# Patient Record
Sex: Male | Born: 1970 | Race: White | Hispanic: No | Marital: Married | Smoking: Current every day smoker
Health system: Southern US, Community
[De-identification: ages and names within clinical notes are randomized; demographics above are authoritative.]

## PROBLEM LIST (undated history)

## (undated) DIAGNOSIS — D45 Polycythemia vera: Principal | ICD-10-CM

## (undated) DIAGNOSIS — G510 Bell's palsy: Secondary | ICD-10-CM

## (undated) DIAGNOSIS — D751 Secondary polycythemia: Secondary | ICD-10-CM

## (undated) DIAGNOSIS — J449 Chronic obstructive pulmonary disease, unspecified: Secondary | ICD-10-CM

## (undated) DIAGNOSIS — E785 Hyperlipidemia, unspecified: Secondary | ICD-10-CM

## (undated) DIAGNOSIS — T7840XA Allergy, unspecified, initial encounter: Secondary | ICD-10-CM

## (undated) DIAGNOSIS — M1990 Unspecified osteoarthritis, unspecified site: Secondary | ICD-10-CM

## (undated) DIAGNOSIS — F419 Anxiety disorder, unspecified: Secondary | ICD-10-CM

## (undated) HISTORY — DX: Allergy, unspecified, initial encounter: T78.40XA

## (undated) HISTORY — DX: Hyperlipidemia, unspecified: E78.5

## (undated) HISTORY — DX: Unspecified osteoarthritis, unspecified site: M19.90

## (undated) HISTORY — DX: Polycythemia vera: D45

## (undated) HISTORY — DX: Bell's palsy: G51.0

## (undated) HISTORY — DX: Anxiety disorder, unspecified: F41.9

## (undated) HISTORY — PX: HAND SURGERY: SHX662

## (undated) HISTORY — PX: DENTAL SURGERY: SHX609

## (undated) HISTORY — DX: Secondary polycythemia: D75.1

---

## 2003-03-04 ENCOUNTER — Emergency Department (HOSPITAL_COMMUNITY): Admission: EM | Admit: 2003-03-04 | Discharge: 2003-03-04 | Payer: Self-pay | Admitting: Emergency Medicine

## 2011-01-23 ENCOUNTER — Ambulatory Visit (INDEPENDENT_AMBULATORY_CARE_PROVIDER_SITE_OTHER): Payer: Self-pay

## 2011-01-23 DIAGNOSIS — Z719 Counseling, unspecified: Secondary | ICD-10-CM

## 2011-05-03 ENCOUNTER — Telehealth: Payer: Self-pay

## 2011-05-03 NOTE — Telephone Encounter (Signed)
Pt was given #90 with 5 refills by Dr. Cleta Alberts in October.  He is about due for a recheck but his prescription should last him the rest of this month. (April 25th).

## 2011-05-03 NOTE — Telephone Encounter (Signed)
Pt would like to know if he could have a refill on xanax.         Pt is out today and he states that he has had the pharmacy fax over a request but there is not a request in pt's chart.

## 2011-05-03 NOTE — Telephone Encounter (Signed)
Chart at PA desk for review

## 2011-05-04 ENCOUNTER — Ambulatory Visit (INDEPENDENT_AMBULATORY_CARE_PROVIDER_SITE_OTHER): Payer: Self-pay | Admitting: Emergency Medicine

## 2011-05-04 VITALS — BP 128/82 | HR 59 | Temp 98.2°F | Resp 16 | Ht 75.0 in | Wt 194.0 lb

## 2011-05-04 DIAGNOSIS — F411 Generalized anxiety disorder: Secondary | ICD-10-CM

## 2011-05-04 DIAGNOSIS — A630 Anogenital (venereal) warts: Secondary | ICD-10-CM

## 2011-05-04 DIAGNOSIS — F419 Anxiety disorder, unspecified: Secondary | ICD-10-CM

## 2011-05-04 MED ORDER — ALPRAZOLAM 1 MG PO TABS: 1.0000 mg | ORAL_TABLET | Freq: Three times a day (TID) | ORAL | Status: DC

## 2011-05-04 MED ORDER — PODOFILOX 0.5 % EX GEL: CUTANEOUS | Status: DC

## 2011-05-04 NOTE — Telephone Encounter (Signed)
PT STATES HIS WIFE WAS REALLY SICK AT ONE TIME AND HE DID TAKE MORE THAN USUAL, BUT HE IS OUT AND IS GOING OUT OF TOWN THIS WEEKEND AND REALLY NEED THIS REFILLED WILL BE GLAD TO SPEAK WITH THE DR ABOUT THIS IF NEEDED. PLEASE CALL [phone]    [facility] [address]

## 2011-05-04 NOTE — Telephone Encounter (Signed)
Spoke w/pt about needing RF bf due. Pt stated that when his wife went through some traumatic seizures and hosp stay recently (she saw Dr Patsy Lager a wk ago and pt was w/her) he had to take more xanax than usual dose to cope, but pt does not have any h/o doing this. D/W pt that he is due for f/up w/Dr Cleta Alberts anyway and suggested he come on in. Pt agreed and will come in this morn

## 2011-05-04 NOTE — Patient Instructions (Signed)
Genital Warts Genital warts are a sexually transmitted infection. They may appear as small bumps on the tissues of the genital area. CAUSES  Genital warts are caused by a virus called human papillomavirus (HPV). HPV is the most common sexually transmitted disease (STD) and infection of the sex organs. This infection is spread by having unprotected sex with an infected person. It can be spread by vaginal, anal, and oral sex. Many people do not know they are infected. They may be infected for years without problems. However, even if they do not have problems, they can unknowingly pass the infection to their sexual partners. SYMPTOMS   Itching and irritation in the genital area.   Warts that bleed.   Painful sexual intercourse.  DIAGNOSIS  Warts are usually recognized with the naked eye on the vagina, vulva, perineum, anus, and rectum. Certain tests can also diagnose genital warts, such as:  A Pap test.   A tissue sample (biopsy) exam.   Colposcopy. A magnifying tool is used to examine the vagina and cervix. The HPV cells will change color when certain solutions are used.  TREATMENT  Warts can be removed by:  Applying certain chemicals, such as cantharidin or podophyllin.   Liquid nitrogen freezing (cryotherapy).   Immunotherapy with candida or trichophyton injections.   Laser treatment.   Burning with an electrified probe (electrocautery).   Interferon injections.   Surgery.  PREVENTION  HPV vaccination can help prevent HPV infections that cause genital warts and that cause cancer of the cervix. It is recommended that the vaccination be given to people between the ages 9 to 26 years old. The vaccine might not work as well or might not work at all if you already have HPV. It should not be given to pregnant women. HOME CARE INSTRUCTIONS   It is important to follow your caregiver's instructions. The warts will not go away without treatment. Repeat treatments are often needed to get  rid of warts. Even after it appears that the warts are gone, the normal tissue underneath often remains infected.   Do not try to treat genital warts with medicine used to treat hand warts. This type of medicine is strong and can burn the skin in the genital area, causing more damage.   Tell your past and current sexual partner(s) that you have genital warts. They may be infected also and need treatment.   Avoid sexual contact while being treated.   Do not touch or scratch the warts. The infection may spread to other parts of your body.   Women with genital warts should have a cervical cancer check (Pap test) at least once a year. This type of cancer is slow-growing and can be cured if found early. Chances of developing cervical cancer are increased with HPV.   Inform your obstetrician about your warts in the event of pregnancy. This virus can be passed to the baby's respiratory tract. Discuss this with your caregiver.   Use a condom during sexual intercourse. Following treatment, the use of condoms will help prevent reinfection.   Ask your caregiver about using over-the-counter anti-itch creams.  SEEK MEDICAL CARE IF:   Your treated skin becomes red, swollen, or painful.   You have a fever.   You feel generally ill.   You feel little lumps in and around your genital area.   You are bleeding or have painful sexual intercourse.  MAKE SURE YOU:   Understand these instructions.   Will watch your condition.   Will   get help right away if you are not doing well or get worse.  Document Released: 01/10/2000 Document Revised: 01/01/2011 Document Reviewed: 07/21/2010 [employer] Patient Information 2012 [employer]. 

## 2011-05-04 NOTE — Progress Notes (Signed)
  Subjective:    Patient ID: Stuart Hill, male    DOB: [birth year], 40 y.o.   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 There is one other isolated area on the shaft that is approximately 1/2-1/2 cm the        Assessment & Plan:   Assessment his venereal warts. We'll treat with Condylox. Xanax was refilled for 2 months. His wife is undergoing treatment for breast cancer.

## 2011-07-01 ENCOUNTER — Telehealth: Payer: Self-pay

## 2011-07-01 ENCOUNTER — Ambulatory Visit: Payer: Self-pay | Admitting: Emergency Medicine

## 2011-07-01 ENCOUNTER — Other Ambulatory Visit: Payer: Self-pay | Admitting: *Deleted

## 2011-07-01 ENCOUNTER — Ambulatory Visit (HOSPITAL_COMMUNITY)
Admission: RE | Admit: 2011-07-01 | Discharge: 2011-07-01 | Disposition: A | Discharge status: Home / Self Care | Payer: Self-pay | Source: Ambulatory Visit | Attending: Emergency Medicine | Admitting: Emergency Medicine

## 2011-07-01 ENCOUNTER — Ambulatory Visit: Payer: Self-pay

## 2011-07-01 VITALS — BP 128/74 | HR 55 | Temp 97.5°F | Resp 16 | Ht 75.0 in | Wt 191.0 lb

## 2011-07-01 DIAGNOSIS — K6289 Other specified diseases of anus and rectum: Secondary | ICD-10-CM | POA: Insufficient documentation

## 2011-07-01 DIAGNOSIS — R109 Unspecified abdominal pain: Secondary | ICD-10-CM | POA: Insufficient documentation

## 2011-07-01 DIAGNOSIS — L509 Urticaria, unspecified: Secondary | ICD-10-CM

## 2011-07-01 DIAGNOSIS — R1084 Generalized abdominal pain: Secondary | ICD-10-CM

## 2011-07-01 DIAGNOSIS — R142 Eructation: Secondary | ICD-10-CM | POA: Insufficient documentation

## 2011-07-01 DIAGNOSIS — F419 Anxiety disorder, unspecified: Secondary | ICD-10-CM

## 2011-07-01 DIAGNOSIS — R141 Gas pain: Secondary | ICD-10-CM | POA: Insufficient documentation

## 2011-07-01 DIAGNOSIS — F411 Generalized anxiety disorder: Secondary | ICD-10-CM

## 2011-07-01 LAB — POCT UA - MICROSCOPIC ONLY
Bacteria, U Microscopic: NEGATIVE
Casts, Ur, LPF, POC: NEGATIVE
Mucus, UA: NEGATIVE
Yeast, UA: NEGATIVE

## 2011-07-01 LAB — POCT CBC
HCT, POC: 50.6 % (ref 43.5–53.7)
Hemoglobin: 16.5 g/dL (ref 14.1–18.1)
Lymph, poc: 2.6 (ref 0.6–3.4)
MCH, POC: 30.4 pg (ref 27–31.2)
MCHC: 32.6 g/dL (ref 31.8–35.4)
WBC: 10.8 10*3/uL — AB (ref 4.6–10.2)

## 2011-07-01 LAB — POCT URINALYSIS DIPSTICK
Bilirubin, UA: NEGATIVE
Blood, UA: NEGATIVE
Nitrite, UA: NEGATIVE
Spec Grav, UA: 1.015
pH, UA: 6.5

## 2011-07-01 LAB — IFOBT (OCCULT BLOOD): IFOBT: NEGATIVE

## 2011-07-01 MED ORDER — ALPRAZOLAM 1 MG PO TABS: 1.0000 mg | ORAL_TABLET | Freq: Three times a day (TID) | ORAL | Status: DC

## 2011-07-01 NOTE — Telephone Encounter (Signed)
Per Dr Cleta Alberts; called Mr Prosser and advised that xray and ultra sound were all normal, take Zantac prescribed.

## 2011-07-01 NOTE — Patient Instructions (Signed)
Please proceed with our hospital for your ultrasound. That will be a call report.

## 2011-07-01 NOTE — Telephone Encounter (Signed)
Note found on patient, and he was called to confirm that he did receive results earlier.  Will follow-up if no improvement in symptoms.

## 2011-07-01 NOTE — Progress Notes (Signed)
Subjective:    Patient ID: Stuart Hill, male    DOB: 1970-11-10, 41 y.o.   MRN: [id number]  HPI patient states that he's been having severe lower abdominal pain with a bloating sensation. Having pain in the suprapubic area as well as pain in his rectal area. He has felt bloated in his upper abdomen but has not actually vomited. While he has been waiting to be examined he has developed hives on his face and trunk which is not unusual for him. He took some Gas-X last night.. he did not eat anything unusual today. He has had a problem with recurrent hives in the past the    Review of Systems he has a severe anxiety disorder and is on Xanax for this problem     Objective:   Physical Exam  Constitutional: He appears well-developed and well-nourished.  HENT:  Head: Normocephalic.       History of exam is normal. There is no swelling of the uvula soft palate or tonsillar area the tongue itself is also normal  Eyes: Pupils are equal, round, and reactive to light.  Neck: No tracheal deviation present. No thyromegaly present.  Cardiovascular: Normal rate and regular rhythm.   Pulmonary/Chest: No respiratory distress. He has no wheezes.  Abdominal: He exhibits distension. There is tenderness. There is no rebound and no guarding.  Skin:       There is an outbreak of hives involving the face neck and upper trunk   Prostate is slightly tender however no masses are felt   .  Results for orders placed in visit on 07/01/11  POCT CBC      Component Value Range   WBC 10.8 (*) 4.6 - 10.2 (K/uL)   Lymph, poc 2.6  0.6 - 3.4    POC LYMPH PERCENT 24.2  10 - 50 (%L)   MID (cbc) 0.7  0 - 0.9    POC MID % 6.7  0 - 12 (%M)   POC Granulocyte 7.5 (*) 2 - 6.9    Granulocyte percent 69.1  37 - 80 (%G)   RBC 5.43  4.69 - 6.13 (M/uL)   Hemoglobin 16.5  14.1 - 18.1 (g/dL)   HCT, POC 65.7  84.6 - 53.7 (%)   MCV 93.2  80 - 97 (fL)   MCH, POC 30.4  27 - 31.2 (pg)   MCHC 32.6  31.8 - 35.4 (g/dL)   RDW, POC  96.2     Platelet Count, POC 299  142 - 424 (K/uL)   MPV 10.9  0 - 99.8 (fL)  POCT UA - MICROSCOPIC ONLY      Component Value Range   WBC, Ur, HPF, POC negative     RBC, urine, microscopic negative     Bacteria, U Microscopic negative     Mucus, UA negative     Epithelial cells, urine per micros 0-2     Crystals, Ur, HPF, POC negative     Casts, Ur, LPF, POC negative     Yeast, UA negativef    POCT URINALYSIS DIPSTICK      Component Value Range   Color, UA yellow     Clarity, UA clear     Glucose, UA negative     Bilirubin, UA negative     Ketones, UA negative     Spec Grav, UA 1.015     Blood, UA negative     pH, UA 6.5     Protein, UA negative  Urobilinogen, UA 0.2     Nitrite, UA negative     Leukocytes, UA Negative    IFOBT (OCCULT BLOOD)      Component Value Range   IFOBT Negative    [facility] reading (PRIMARY) by  Dr.Brayla Pat acute abdominal series is normal.       Assessment & Plan:  Urine and stool occult blood was negative. His white cell count is very slightly elevated. We'll try and proceed with an ultrasound of the entire abdomen to see if we can pick up any abnormalities. Patient is allergic to all antihistamines and multiple medications. I decided that he could give him a trial of ranitidine 150 twice a day. I did give him a prescription for an EpiPen which he has had in the past however he has never filled. Because of his history of severe allergies and allergic reactions I was afraid to do a CT with contrast and we'll proceed with an abdominal ultrasound.

## 2011-08-31 ENCOUNTER — Ambulatory Visit: Payer: Self-pay | Admitting: Physician Assistant

## 2011-08-31 ENCOUNTER — Telehealth: Payer: Self-pay

## 2011-08-31 VITALS — BP 138/72 | HR 71 | Temp 98.0°F | Resp 16 | Ht 74.75 in | Wt 189.8 lb

## 2011-08-31 DIAGNOSIS — L03119 Cellulitis of unspecified part of limb: Secondary | ICD-10-CM

## 2011-08-31 DIAGNOSIS — IMO0002 Reserved for concepts with insufficient information to code with codable children: Secondary | ICD-10-CM

## 2011-08-31 MED ORDER — SULFAMETHOXAZOLE-TRIMETHOPRIM 800-160 MG PO TABS: 1.0000 | ORAL_TABLET | Freq: Two times a day (BID) | ORAL | Status: AC

## 2011-08-31 MED ORDER — DOXYCYCLINE HYCLATE 100 MG PO CAPS: 100.0000 mg | ORAL_CAPSULE | Freq: Two times a day (BID) | ORAL | Status: DC

## 2011-08-31 NOTE — Telephone Encounter (Signed)
Pt is went to pharmacy and was told to call office and let Dr know that it is over $100 he didn't remember Dr. name

## 2011-08-31 NOTE — Telephone Encounter (Signed)
Doxycycline is expensive, took Rx to [facility] it is $100 he is at pharmacy awaiting instructions from [place] about this.

## 2011-08-31 NOTE — Telephone Encounter (Signed)
Please advise patient I've sent in Septra to replace the Doxycycline.

## 2011-08-31 NOTE — Progress Notes (Signed)
  Subjective:    Patient ID: Stuart Hill, male    DOB: 1970/08/30, 41 y.o.   MRN: [id number]  HPI Patient presents with 3 day history of a red bump on his right forearm. Says it started as an ingrown hair but has grown and become hard and more painful. No spontaneous purulent drainage. Does admit to some warmth to touch.  No fevers, chills, nausea, or vomiting.     Review of Systems  All other systems reviewed and are negative.       Objective:   Physical Exam  Constitutional: He is oriented to person, place, and time. He appears well-developed and well-nourished.  HENT:  Head: Normocephalic and atraumatic.  Right Ear: External ear normal.  Left Ear: External ear normal.  Eyes: Conjunctivae are normal.  Neck: Normal range of motion.  Cardiovascular: Regular rhythm.   Pulmonary/Chest: Effort normal.  Neurological: He is alert and oriented to person, place, and time.  Skin:     Psychiatric: He has a normal mood and affect. His behavior is normal. Judgment and thought content normal.          Assessment & Plan:   1. Cellulitis of arm  Wound culture, doxycycline (VIBRAMYCIN) 100 MG capsule  Culture sent. Start Doxycycline 100 mg bid x 10 days.   Follow up if symptoms persist.

## 2011-09-01 NOTE — Telephone Encounter (Signed)
Called patient to advise  °

## 2011-09-03 LAB — WOUND CULTURE
Gram Stain: NONE SEEN
Gram Stain: NONE SEEN
Gram Stain: NONE SEEN

## 2011-10-27 ENCOUNTER — Encounter: Payer: Self-pay | Admitting: Physician Assistant

## 2011-10-27 ENCOUNTER — Ambulatory Visit: Payer: Self-pay | Admitting: Physician Assistant

## 2011-10-27 VITALS — BP 129/81 | HR 88 | Temp 98.0°F | Resp 16 | Ht 75.0 in | Wt 190.0 lb

## 2011-10-27 DIAGNOSIS — J069 Acute upper respiratory infection, unspecified: Secondary | ICD-10-CM

## 2011-10-27 DIAGNOSIS — H6690 Otitis media, unspecified, unspecified ear: Secondary | ICD-10-CM

## 2011-10-27 LAB — POCT INFLUENZA A/B: Influenza B, POC: NEGATIVE

## 2011-10-27 MED ORDER — IPRATROPIUM BROMIDE 0.03 % NA SOLN: 2.0000 | Freq: Two times a day (BID) | NASAL | Status: DC

## 2011-10-27 MED ORDER — AMOXICILLIN 875 MG PO TABS: 1750.0000 mg | ORAL_TABLET | Freq: Two times a day (BID) | ORAL | Status: DC

## 2011-10-27 NOTE — Progress Notes (Signed)
Subjective:    Patient ID: Stuart Hill, male    DOB: 1970/05/12, 41 y.o.   MRN: [id number]  HPI  Stuart Hill is a 41 yr old male complaining of a 1 day history of nasal congestion, sore throat, ear pain and fullness, headache, and malaise.  He woke up yesterday morning with lots of nasal congestion and drainage that caused him to vomit.  He felt fatigued all day and developed swollen glands, and ear pain.  He has had no further vomiting.  He tried Afrin last night and this morning but then developed hives under his eyes.  He states he has allergies to many medications with the exception of Xanax and antibiotics.  He has not had any fever.  He has had headache as well as some loose stools.  He has some cough but his predominant symptoms are nasal congestion and rhinorrhea.  He requests a flu test.  He has not had a flu shot in 25 years because it "induces flu in the body".     Review of Systems  Constitutional: Positive for chills, diaphoresis, appetite change and fatigue. Negative for fever.  HENT: Positive for ear pain, congestion, rhinorrhea and sneezing.   Eyes: Positive for discharge.  Respiratory: Positive for cough. Negative for shortness of breath.   Cardiovascular: Negative.   Gastrointestinal: Negative.   Musculoskeletal: Positive for myalgias. Negative for arthralgias.  Neurological: Positive for dizziness and headaches.  Hematological: Positive for adenopathy.       Objective:   Physical Exam  Constitutional: He is oriented to person, place, and time. He appears well-developed and well-nourished. He does not appear ill.  HENT:  Right Ear: External ear and ear canal normal. Tympanic membrane is erythematous and retracted.  Left Ear: Tympanic membrane, external ear and ear canal normal.  Nose: Mucosal edema and rhinorrhea present. Right sinus exhibits no maxillary sinus tenderness and no frontal sinus tenderness. Left sinus exhibits no maxillary sinus tenderness and no  frontal sinus tenderness.  Mouth/Throat: Uvula is midline, oropharynx is clear and moist and mucous membranes are normal.  Cardiovascular: Normal rate, regular rhythm and normal heart sounds.   Pulmonary/Chest: Breath sounds normal. He has no wheezes. He has no rales.  Lymphadenopathy:    He has no cervical adenopathy.  Neurological: He is alert and oriented to person, place, and time.  Skin: Skin is warm and dry.    Results for orders placed in visit on 10/27/11  POCT INFLUENZA A/B      Component Value Range   Influenza A, POC Negative     Influenza B, POC Negative           Assessment & Plan:   1. URI (upper respiratory infection)  POCT Influenza A/B, ipratropium (ATROVENT) 0.03 % nasal spray  2. OM (otitis media)  amoxicillin (AMOXIL) 875 MG tablet   Stuart Hill is a 41 yr old male with a 1 day history of URI symptoms.  He requested a flu test which was negative.  My feeling is that this is likely a viral URI; however, the patient was fairly persistent in asking for medication.  His right TM is erythematous and retracted; it's possible that this is an early otitis media infection.  I offered a short course of prednisone to help reduce inflammation and pain, but the patient said that he does not tolerate steroids.  So I went ahead and sent a prescription for amoxicillin for presumptive otitis media.  Additionally I sent Atrovent nasal spray  for relief of nasal congestion and rhinorrhea.  The patient was given URI home care instructions.  He will return if worsening or not improving.

## 2011-10-27 NOTE — Patient Instructions (Signed)

## 2011-10-28 ENCOUNTER — Encounter: Payer: Self-pay | Admitting: Physician Assistant

## 2011-10-28 NOTE — Progress Notes (Signed)
Discussed, reviewed and agree.  

## 2011-10-29 ENCOUNTER — Telehealth: Payer: Self-pay

## 2011-10-29 NOTE — Telephone Encounter (Signed)
The patient's wife [name] called to report that [name] is still not feeling better from his 10/27/11 appointment, and is coughing up brown, green and yellow phlegm.  The patient's wife is questioning whether the patient needs different antibiotic.  Please call at [phone].

## 2011-10-29 NOTE — Telephone Encounter (Signed)
He is on Amox, I have called to advise may need to give more time. Asked if he is taking Mucinex, he can not take, he gets a rash. Wife advised he is not running a fever, he just has a very productive cough. She will give the ABX more time, if he is not better tomorrow to bring him back in. She asked what else we may do. Advised he may need chest xray if he does not improve. Advised to return sooner if he develops a fever or if his symptoms change.   FYI

## 2011-10-30 ENCOUNTER — Ambulatory Visit: Payer: Self-pay | Admitting: Family Medicine

## 2011-10-30 VITALS — BP 118/82 | HR 86 | Temp 98.1°F | Resp 16 | Ht 72.0 in | Wt 189.0 lb

## 2011-10-30 DIAGNOSIS — J029 Acute pharyngitis, unspecified: Secondary | ICD-10-CM

## 2011-10-30 DIAGNOSIS — R5383 Other fatigue: Secondary | ICD-10-CM

## 2011-10-30 DIAGNOSIS — R5381 Other malaise: Secondary | ICD-10-CM

## 2011-10-30 LAB — POCT RAPID STREP A (OFFICE): Rapid Strep A Screen: NEGATIVE

## 2011-10-30 LAB — POCT CBC
Granulocyte percent: 64.3 %G (ref 37–80)
MCV: 96.2 fL (ref 80–97)
MID (cbc): 0.7 (ref 0–0.9)
MPV: 10 fL (ref 0–99.8)
POC Granulocyte: 5.6 (ref 2–6.9)
POC LYMPH PERCENT: 27.7 %L (ref 10–50)
POC MID %: 8 %M (ref 0–12)
Platelet Count, POC: 306 10*3/uL (ref 142–424)
RBC: 5.52 M/uL (ref 4.69–6.13)
RDW, POC: 13.7 %

## 2011-10-30 LAB — TSH: TSH: 1.283 u[IU]/mL (ref 0.350–4.500)

## 2011-10-30 NOTE — Progress Notes (Signed)
[facility] [address], [place] [address] [phone]  Date:  10/30/2011   Name:  Stuart Hill   DOB:  March 10, 1970   MRN:  [id number]  PCP:  Lucilla Edin, MD    Chief Complaint: Generalized Body Aches, Sinusitis, Cough, Sore Throat and Chest Pain   History of Present Illness:  Stuart Hill is a 41 y.o. very pleasant male patient who presents with the following:  Here today for follow- up of illness.  Seen on 10/27/11: HPI [name] is a 41 yr old male complaining of a 1 day history of nasal congestion, sore throat, ear pain and fullness, headache, and malaise. He woke up yesterday morning with lots of nasal congestion and drainage that caused him to vomit. He felt fatigued all day and developed swollen glands, and ear pain. He has had no further vomiting. He tried Afrin last night and this morning but then developed hives under his eyes. He states he has allergies to many medications with the exception of Xanax and antibiotics. He has not had any fever. He has had headache as well as some loose stools. He has some cough but his predominant symptoms are nasal congestion and rhinorrhea. He requests a flu test. He has not had a flu shot in 25 years because it "induces flu in the body".  He was felt to have a viral URI, but he was started on amoxicillin per his request and for possible early OM.  875 BID  He has not gotten much better yet.  He still notes a lot of pressure in his sinuses.  He is coughing up discolored mucus.  He has a headache and feels fatigued.  He was allergic to the atrovent nasal spray.  He wonders if he needs to have his thyroid checked as he tends to have hives and intolerances to lots of different things such as pressure from a tight shirt and many medications. He through this meant that things are not metabolized by his body normally   He has had a temperature up to 101.7 a few days ago, but this broke quickly.  He does feel feverish  still but his temp has been ok.  No antipyretics today.    There is no problem list on file for this patient.   Past Medical History  Diagnosis Date  . Allergy     No past surgical history on file.  History  Substance Use Topics  . Smoking status: Current Every Day Smoker -- 1.0 packs/day for 24 years    Types: Cigarettes  . Smokeless tobacco: Not on file  . Alcohol Use: Not on file    Family History  Problem Relation Age of Onset  . COPD Mother   . Fibromyalgia Mother   . Heart disease Father   . Depression Brother   . Heart disease Maternal Grandmother   . Stroke Maternal Grandmother   . Heart disease Maternal Grandfather   . Stroke Maternal Grandfather   . Heart disease Paternal Grandmother   . Stroke Paternal Grandmother   . Heart disease Paternal Grandfather   . Stroke Paternal Grandfather     Allergies  Allergen Reactions  . Advil (Ibuprofen) Hives  . Afrin (Nasal Spray) Hives  . Antihistamines, Chlorpheniramine-Type   . Atrovent (Ipratropium) Hives  . Bc Powder (Aspirin-Salicylamide-Caffeine)   . Benadryl (Diphenhydramine Hcl) Hives  . Codeine Hives  . Gastromax (Metoclopramide)   . Morphine And Related Hives  .  [name] (Carisoprodol) Hives  . Tylenol (Acetaminophen) Hives    Medication list has been reviewed and updated.  Current Outpatient Prescriptions on File Prior to Visit  Medication Sig Dispense Refill  . ALPRAZolam (XANAX) 1 MG tablet Take 1 tablet (1 mg total) by mouth 3 (three) times daily.  90 tablet  5  . amoxicillin (AMOXIL) 875 MG tablet Take 2 tablets (1,750 mg total) by mouth 2 (two) times daily.  20 tablet  0  . ipratropium (ATROVENT) 0.03 % nasal spray Place 2 sprays into the nose 2 (two) times daily.  30 mL  5  . podofilox (CONDYLOX) 0.5 % gel Apply twice a day for 3 days followed by 4 days of rest. Then reapply as instructed.  3.5 g  3    Review of Systems:  As per HPI- otherwise negative.   Physical Examination: Filed Vitals:    10/30/11 1242  BP: 118/82  Pulse: 86  Temp: 98.1 F (36.7 C)  Resp: 16   Filed Vitals:   10/30/11 1242  Height: 6' (1.829 m)  Weight: 189 lb (85.73 kg)   Body mass index is 25.63 kg/(m^2). Ideal Body Weight: Weight in (lb) to have BMI = 25: 183.9   GEN: WDWN, NAD, Non-toxic, A & O x 3- does not appear acutely ill today HEENT: Atraumatic, Normocephalic. Neck supple. No masses, No LAD. Bilateral TM wnl- no current evidence of OM, oropharynx normal to slightly injected.  PEERL,EOMI.   Ears and Nose: No external deformity. CV: RRR, No M/G/R. No JVD. No thrill. No extra heart sounds. PULM: CTA B, no wheezes, crackles, rhonchi. No retractions. No resp. distress. No accessory muscle use. ABD: S, NT, ND, +BS. No rebound. No HSM. EXTR: No c/c/e NEURO Normal gait.  PSYCH: Normally interactive. Conversant. Not depressed or anxious appearing.  Calm demeanor.   Results for orders placed in visit on 10/30/11  POCT CBC      Component Value Range   WBC 8.7  4.6 - 10.2 K/uL   Lymph, poc 2.4  0.6 - 3.4   POC LYMPH PERCENT 27.7  10 - 50 %L   MID (cbc) 0.7  0 - 0.9   POC MID % 8.0  0 - 12 %M   POC Granulocyte 5.6  2 - 6.9   Granulocyte percent 64.3  37 - 80 %G   RBC 5.52  4.69 - 6.13 M/uL   Hemoglobin 17.4  14.1 - 18.1 g/dL   HCT, POC 16.1  09.6 - 53.7 %   MCV 96.2  80 - 97 fL   MCH, POC 31.5 (*) 27 - 31.2 pg   MCHC 32.8  31.8 - 35.4 g/dL   RDW, POC 04.5     Platelet Count, POC 306  142 - 424 K/uL   MPV 10.0  0 - 99.8 fL  POCT RAPID STREP A (OFFICE)      Component Value Range   Rapid Strep A Screen Negative  Negative    Assessment and Plan: 1. Fatigue  POCT CBC, TSH  2. Sore throat  POCT rapid strep A   Did check a TSH today per [name]'s request.  I do think that he likely has a viral illness.  He may even have the flu, despite his negative flu test.  However, his normal CBC is reassuring that he does not have an occult bacterial infection.  Encouraged him to rest, drink plenty of  fluids, and finish out the amoxicillin.  If he is not better  in the next couple of days please let me know.    Abbe Amsterdam, MD

## 2011-11-04 ENCOUNTER — Telehealth: Payer: Self-pay

## 2011-11-04 DIAGNOSIS — J40 Bronchitis, not specified as acute or chronic: Secondary | ICD-10-CM

## 2011-11-04 MED ORDER — AZITHROMYCIN 250 MG PO TABS: ORAL_TABLET | ORAL | Status: DC

## 2011-11-04 NOTE — Telephone Encounter (Signed)
Pt states that he is experiencing chest congestion, cough, and is still fatigued. Pt would like to discuss whether it is neccessary to have a chest x-ray to determine whether or not he has developed pneumonia. Best# [phone]

## 2011-11-04 NOTE — Telephone Encounter (Signed)
Dr Patsy Lager, in your OV notes you had asked pt to let you know if he does not start to feel better. Do you want to have me call pt back w/instr's?

## 2011-11-04 NOTE — Telephone Encounter (Signed)
Called to check on him- he still notes that he is coughing up a lot of phlegm.  The phlegm he is coughing up is discolored.  He still feels congested in his ear and his ear is still sensitive.  His symptoms have more moved to his chest, and the ear hurts.   He finished the amoxicillin about 3 days ago.  He has not had any fever recently.  He is concerned that he is still coughing.  We will try a zpak - I called this in for him.  He will let me know if not helpful

## 2011-11-23 ENCOUNTER — Ambulatory Visit: Payer: Self-pay | Admitting: Internal Medicine

## 2011-11-23 VITALS — BP 130/83 | HR 72 | Temp 98.2°F | Resp 16 | Ht 75.5 in | Wt 192.4 lb

## 2011-11-23 DIAGNOSIS — S0180XA Unspecified open wound of other part of head, initial encounter: Secondary | ICD-10-CM

## 2011-11-23 NOTE — Progress Notes (Signed)
   [address] [place] [address]   Phone [phone]  Subjective:    Patient ID: Stuart Hill, male    DOB: [birth year], 41 y.o.   MRN: [id number]  HPI Pt presents to clinic after a metal tool hit him in the face while at work.  He has some pain on his R TMJ area with opening his mouth.  He has a small cut on his R external cheek and inside buccal mucosa.  He does not know when his last tetanus injection was but he is worried about getting it because of his long list of allergies to medications.  He really does not want any stitches.  No teeth pain, he is missing a few teeth but that was before his incident.  He had no LOC upon impact and only his head was knocked back upon impact. He has a slight HA.   Review of Systems     Objective:   Physical Exam  Vitals reviewed. Constitutional: He appears well-developed and well-nourished.  Eyes: Conjunctivae normal are normal.  Pulmonary/Chest: Effort normal.  Skin: Skin is warm and dry.       Small ~3-55mm laceration on L cheek  - buccal mucosa laceration about 2cm long but not an open flap.  Psychiatric: He has a normal mood and affect. His behavior is normal. Judgment and thought content normal.   No teeth TTP, no Jaw TTP, he only has pain in his TMJ with opening/closing his jaw. No maxillary TTP.      Assessment & Plan:   1. Wound, open, face    Will not update pt's tetanus due to his concern with his allergies and the low likelihood of developing tetanus.  Pt decline laceration repair on face/cheek.  Due to no TTP of jaw do not think an xray will change our treatment plan.  Pt to gargle to remove food particles from buccal laceration and monitor for changing symptoms.  RTC if anything changes.  Pt agrees with the treatment plan.  D/w Dr Merla Riches. I have reviewed and agree with documentation. Robert P. Merla Riches, M.D.

## 2012-01-27 ENCOUNTER — Telehealth: Payer: Self-pay | Admitting: *Deleted

## 2012-01-27 DIAGNOSIS — F419 Anxiety disorder, unspecified: Secondary | ICD-10-CM

## 2012-01-27 NOTE — Telephone Encounter (Signed)
[facility] requesting refill on alprazolam 1mg . Last refill on 12/21/11

## 2012-01-27 NOTE — Telephone Encounter (Signed)
Forward to Dr. Daub 

## 2012-01-28 MED ORDER — ALPRAZOLAM 1 MG PO TABS: 1.0000 mg | ORAL_TABLET | Freq: Three times a day (TID) | ORAL | Status: DC

## 2012-01-28 NOTE — Telephone Encounter (Signed)
Patient may have a refill on his alprazolam. He can have 2 refills on this prescription .

## 2012-01-28 NOTE — Telephone Encounter (Signed)
Printed off Rx. Pt called to check on this at the same time, before it could be called to pharmacy and Maralyn Sago called it in to pharmacy for pt.

## 2012-03-08 ENCOUNTER — Telehealth: Payer: Self-pay

## 2012-03-08 NOTE — Telephone Encounter (Signed)
Dr. Cleta Alberts   Wife states husband has a fever of 101, and his legs ache, she cannot get him to come to the clinic.   She would like you to talk him into being seen.    [phone]

## 2012-03-08 NOTE — Telephone Encounter (Signed)
Tell the patient you talked to me and he needs to come to the clinic today so he can be checked and started on flu medication if it is appropriate

## 2012-03-08 NOTE — Telephone Encounter (Signed)
I have called his wife to advise he should come in for this, she has told her husband and is trying to get him to come in. He wants an antibiotic, advised we can not do this without office visit.I also advised if this is viral, antibiotic would not be treatment used. Advised if it is the flu there are medications which can be given if he is seen within 48 hours. His wife states he can not take tylenol or ibuprofen. To you FYI

## 2012-03-08 NOTE — Telephone Encounter (Signed)
I called back and advised wife he needs to come in.

## 2012-04-13 ENCOUNTER — Telehealth: Payer: Self-pay

## 2012-04-13 DIAGNOSIS — F419 Anxiety disorder, unspecified: Secondary | ICD-10-CM

## 2012-04-13 MED ORDER — ALPRAZOLAM 1 MG PO TABS: 1.0000 mg | ORAL_TABLET | Freq: Three times a day (TID) | ORAL | Status: DC

## 2012-04-13 NOTE — Telephone Encounter (Signed)
Printed will fax

## 2012-04-13 NOTE — Telephone Encounter (Signed)
Okay to refill prescription along with 3 more refills.

## 2012-04-13 NOTE — Telephone Encounter (Signed)
Pharm req Rf of alprazolam 1 mg tabs. Dr Cleta Alberts, do you want to RF or does pt need to RTC first?

## 2012-04-13 NOTE — Telephone Encounter (Signed)
Faxed [facility]

## 2012-08-09 ENCOUNTER — Telehealth: Payer: Self-pay

## 2012-08-09 NOTE — Telephone Encounter (Signed)
Okay to give the patient 2 refills on his medication .

## 2012-08-09 NOTE — Telephone Encounter (Signed)
Pharm requests RF of alprazolam 1 mg. Dr Cleta Alberts, do you need to see the pt back first or RF?

## 2012-08-10 ENCOUNTER — Telehealth: Payer: Self-pay

## 2012-08-10 ENCOUNTER — Telehealth: Payer: Self-pay | Admitting: Family Medicine

## 2012-08-10 DIAGNOSIS — F419 Anxiety disorder, unspecified: Secondary | ICD-10-CM

## 2012-08-10 MED ORDER — ALPRAZOLAM 1 MG PO TABS: 1.0000 mg | ORAL_TABLET | Freq: Three times a day (TID) | ORAL | Status: DC

## 2012-08-10 NOTE — Telephone Encounter (Signed)
See previous message. Called in to pharmacy. Not e scribed.

## 2012-08-10 NOTE — Telephone Encounter (Signed)
It is okay to call in a refill of his alprazolam. I think this may have already been done.

## 2012-08-10 NOTE — Telephone Encounter (Signed)
[name] STATES HER HUSBAND IS IN NEED OF HIS ALPRAZOLAM PRESCRIPTIONS. PLEASE CALL THE PHARMACY FOR THEIR REFILLS YOU MAY REACH PT AT [phone]

## 2012-08-10 NOTE — Telephone Encounter (Signed)
Patient called back stating the person she talked to earlier told them it would be done today before pharmacy closed. I [facility]

## 2012-08-11 NOTE — Telephone Encounter (Signed)
Called [facility] and gave order that pt can have #90 w/ 1 RF per Dr Cleta Alberts.

## 2012-09-08 ENCOUNTER — Ambulatory Visit: Payer: Self-pay | Admitting: Family Medicine

## 2012-09-08 VITALS — BP 130/78 | HR 76 | Temp 97.3°F | Resp 18 | Ht 75.0 in | Wt 197.0 lb

## 2012-09-08 DIAGNOSIS — F419 Anxiety disorder, unspecified: Secondary | ICD-10-CM

## 2012-09-08 DIAGNOSIS — F411 Generalized anxiety disorder: Secondary | ICD-10-CM

## 2012-09-08 MED ORDER — ALPRAZOLAM 1 MG PO TABS: 1.0000 mg | ORAL_TABLET | Freq: Three times a day (TID) | ORAL | Status: DC

## 2012-09-08 NOTE — Patient Instructions (Signed)
Let us know if you need anything.  

## 2012-09-08 NOTE — Progress Notes (Signed)
[facility] [address] [place] [address] [phone]  Date:  09/08/2012   Name:  Stuart Hill   DOB:  Jun 05, 1970   MRN:  [id number]  PCP:  Lucilla Edin, MD    Chief Complaint: rx refills   History of Present Illness:  Stuart Hill is a 42 y.o. very pleasant male patient who presents with the following:  History of anxiety.  He is taking xanax.  He is taking 1mg  TID.  This is working well for him.  He has been on this for a few years and doing well.  He is not sleeping that great.  If he wakes up at night it can be difficult for him to go back to sleep.  Otherwise things are going well for him.  He is married, and enjoys spending time with his wife.  They are here together today.   He works in Aeronautical engineer.    There are no active problems to display for this patient.   Past Medical History  Diagnosis Date  . Allergy   . Arthritis   . Hypertension   . Anxiety     History reviewed. No pertinent past surgical history.  History  Substance Use Topics  . Smoking status: Current Every Day Smoker -- 1.00 packs/day for 24 years    Types: Cigarettes  . Smokeless tobacco: Not on file  . Alcohol Use: Not on file    Family History  Problem Relation Age of Onset  . COPD Mother   . Fibromyalgia Mother   . Heart disease Father   . Depression Brother   . Heart disease Maternal Grandmother   . Stroke Maternal Grandmother   . Heart disease Maternal Grandfather   . Stroke Maternal Grandfather   . Heart disease Paternal Grandmother   . Stroke Paternal Grandmother   . Heart disease Paternal Grandfather   . Stroke Paternal Grandfather     Allergies  Allergen Reactions  . Advil [Ibuprofen] Hives  . Afrin [Nasal Spray] Hives  . Antihistamines, Chlorpheniramine-Type   . Atrovent [Ipratropium] Hives  . Bc Powder [Aspirin-Salicylamide-Caffeine]   . Benadryl [Diphenhydramine Hcl] Hives  . Codeine Hives  . Gastromax [Metoclopramide]   . Morphine  And Related Hives  . [name] [Carisoprodol] Hives  . Tylenol [Acetaminophen] Hives    Medication list has been reviewed and updated.  Current Outpatient Prescriptions on File Prior to Visit  Medication Sig Dispense Refill  . ALPRAZolam (XANAX) 1 MG tablet Take 1 tablet (1 mg total) by mouth 3 (three) times daily.  90 tablet  0   No current facility-administered medications on file prior to visit.    Review of Systems:  As per HPI- otherwise negative.   Physical Examination: Filed Vitals:   09/08/12 1502  BP: 130/78  Pulse: 76  Temp: 97.3 F (36.3 C)  Resp: 18   Filed Vitals:   09/08/12 1502  Height: 6\' 3"  (1.905 m)  Weight: 197 lb (89.359 kg)   Body mass index is 24.62 kg/(m^2). Ideal Body Weight: Weight in (lb) to have BMI = 25: 199.6  GEN: WDWN, NAD, Non-toxic, A & O x 3 HEENT: Atraumatic, Normocephalic. Neck supple. No masses, No LAD. Ears and Nose: No external deformity. CV: RRR, No M/G/R. No JVD. No thrill. No extra heart sounds. PULM: CTA B, no wheezes, crackles, rhonchi. No retractions. No resp. distress. No accessory muscle use. ABD: S, NT, ND, +BS. No rebound. No HSM. EXTR: No c/c/e  NEURO Normal gait.  PSYCH: Normally interactive. Conversant. Not depressed or anxious appearing.  Calm demeanor.    Assessment and Plan: Anxiety - Plan: ALPRAZolam (XANAX) 1 MG tablet  Refilled xanax.  Stable at his current dosage.  He will let me know if any other concerns.  He did show me what appears to be small warts on his hands.  Offered to do a bx to confirm dx, he declines at this time   Signed Abbe Amsterdam, MD

## 2012-11-11 ENCOUNTER — Ambulatory Visit (INDEPENDENT_AMBULATORY_CARE_PROVIDER_SITE_OTHER): Payer: Self-pay | Admitting: Family Medicine

## 2012-11-11 VITALS — BP 120/76 | HR 88 | Temp 98.8°F | Resp 20 | Ht 73.0 in | Wt 196.4 lb

## 2012-11-11 DIAGNOSIS — R21 Rash and other nonspecific skin eruption: Secondary | ICD-10-CM

## 2012-11-11 NOTE — Patient Instructions (Signed)
It looks like you have contact dermatitis such as poison ivy.  Please give me a call if you have any concerns.

## 2012-11-11 NOTE — Progress Notes (Signed)
[facility] [address], [place] [address] [phone]  Date:  11/11/2012   Name:  Stuart Hill   DOB:  1970-12-17   MRN:  [id number]  PCP:  Lucilla Edin, MD    Chief Complaint: Rash   History of Present Illness:  Stuart Hill is a 42 y.o. very pleasant male patient who presents with the following:  He is concerned about a lesions he noted on his right upper arm yesterday.  It seems to be getting larger, is a bit itchy.  He is worried because his wife is a breast cancer survivor and had a severe MRSA infection last year.  He is afraid he could have MRSA and give it to her.  otherwise he feels well.  He does do a lot of work outside but does not think he has been exposed to any infectious plants lately   There are no active problems to display for this patient.   Past Medical History  Diagnosis Date  . Allergy   . Arthritis   . Hypertension   . Anxiety     History reviewed. No pertinent past surgical history.  History  Substance Use Topics  . Smoking status: Current Every Day Smoker -- 1.00 packs/day for 24 years    Types: Cigarettes  . Smokeless tobacco: Not on file  . Alcohol Use: Not on file    Family History  Problem Relation Age of Onset  . COPD Mother   . Fibromyalgia Mother   . Heart disease Father   . Depression Brother   . Heart disease Maternal Grandmother   . Stroke Maternal Grandmother   . Heart disease Maternal Grandfather   . Stroke Maternal Grandfather   . Heart disease Paternal Grandmother   . Stroke Paternal Grandmother   . Heart disease Paternal Grandfather   . Stroke Paternal Grandfather     Allergies  Allergen Reactions  . Advil [Ibuprofen] Hives  . Afrin [Nasal Spray] Hives  . Antihistamines, Chlorpheniramine-Type   . Atrovent [Ipratropium] Hives  . Bc Powder [Aspirin-Salicylamide-Caffeine]   . Benadryl [Diphenhydramine Hcl] Hives  . Codeine Hives  . Gastromax [Metoclopramide]   . Morphine And  Related Hives  . [name] [Carisoprodol] Hives  . Tylenol [Acetaminophen] Hives    Medication list has been reviewed and updated.  Current Outpatient Prescriptions on File Prior to Visit  Medication Sig Dispense Refill  . ALPRAZolam (XANAX) 1 MG tablet Take 1 tablet (1 mg total) by mouth 3 (three) times daily.  90 tablet  5   No current facility-administered medications on file prior to visit.    Review of Systems:  As per HPI- otherwise negative.   Physical Examination: Filed Vitals:   11/11/12 1815  BP: 120/76  Pulse: 88  Temp: 98.8 F (37.1 C)  Resp: 20   Filed Vitals:   11/11/12 1815  Height: 6\' 1"  (1.854 m)  Weight: 196 lb 6.4 oz (89.086 kg)   Body mass index is 25.92 kg/(m^2). Ideal Body Weight: Weight in (lb) to have BMI = 25: 189.1  GEN: WDWN, NAD, Non-toxic, A & O x 3, looks well HEENT: Atraumatic, Normocephalic. Neck supple. No masses, No LAD.  No oral lesions Ears and Nose: No external deformity. CV: RRR, No M/G/R. No JVD. No thrill. No extra heart sounds. PULM: CTA B, no wheezes, crackles, rhonchi. No retractions. No resp. distress. No accessory muscle use. EXTR: No c/c/e NEURO Normal gait.  PSYCH: Normally interactive. Conversant.  Not depressed or anxious appearing.  Calm demeanor.  On the right lateral deltoid there is an approx 1in by 3in area of classic contact or rhus dermatitis.  It has a few small vesicles and linear elements typical of exposure to a plant.   No other rash    Assessment and Plan Rash and nonspecific skin eruption  Reassured that this does not appear to be MRSA or any other bacterial infection.  He will treat conservatively for contact derm and let me know if not better/ call if he has any questions or concerns.    Signed Abbe Amsterdam, MD

## 2013-01-09 ENCOUNTER — Ambulatory Visit: Payer: Self-pay | Admitting: Family Medicine

## 2013-01-09 VITALS — BP 122/80 | HR 79 | Temp 98.0°F | Resp 18 | Wt 200.0 lb

## 2013-01-09 DIAGNOSIS — F17200 Nicotine dependence, unspecified, uncomplicated: Secondary | ICD-10-CM

## 2013-01-09 DIAGNOSIS — R05 Cough: Secondary | ICD-10-CM

## 2013-01-09 DIAGNOSIS — B349 Viral infection, unspecified: Secondary | ICD-10-CM

## 2013-01-09 DIAGNOSIS — R059 Cough, unspecified: Secondary | ICD-10-CM

## 2013-01-09 DIAGNOSIS — R6889 Other general symptoms and signs: Secondary | ICD-10-CM

## 2013-01-09 DIAGNOSIS — B9789 Other viral agents as the cause of diseases classified elsewhere: Secondary | ICD-10-CM

## 2013-01-09 LAB — POCT INFLUENZA A/B: Influenza A, POC: NEGATIVE

## 2013-01-09 MED ORDER — BENZONATATE 100 MG PO CAPS: 100.0000 mg | ORAL_CAPSULE | Freq: Three times a day (TID) | ORAL | Status: DC | PRN

## 2013-01-09 MED ORDER — AMOXICILLIN 875 MG PO TABS: 1750.0000 mg | ORAL_TABLET | Freq: Two times a day (BID) | ORAL | Status: DC

## 2013-01-09 NOTE — Patient Instructions (Signed)
Plenty of fluids  Get sufficient rest  Take Mucinex DM as directed  Take amoxicillin one twice daily  Use the cough pills one or 2 every 6 or 8 hours as needed for cough  Return if worse

## 2013-01-09 NOTE — Progress Notes (Signed)
Subjective: 42 year old man who started getting ill on Friday. He has been extremely fatigued. He slept all day Saturday and Sunday and soft today. He is self-employed, a Administrator. Has felt just plain lousy. His throat has been sore he coughs a lot. He had some episodes of whooping cough-like cough. His lungs were little wheezy on Friday when he was walking. He has not been wheezing since then. He does smoke he has not had any documented fever, body broken out in heavy sweating at times.  Objective: TMs are normal. Throat moderately erythematous. Neck supple with anterior cervical nodes moderate chest clear to auscultation. Heart regular without murmurs.  Assessment: Flulike illness  Plan: Flu swab Results for orders placed in visit on 01/09/13  POCT INFLUENZA A/B      Result Value Range   Influenza A, POC Negative     Influenza B, POC Negative     Cough Viral syndrome Tobacco use disorder  See instructions

## 2013-03-13 ENCOUNTER — Other Ambulatory Visit: Payer: Self-pay | Admitting: Family Medicine

## 2013-03-13 DIAGNOSIS — F411 Generalized anxiety disorder: Secondary | ICD-10-CM

## 2013-03-20 ENCOUNTER — Telehealth: Payer: Self-pay

## 2013-03-20 NOTE — Telephone Encounter (Addendum)
PT STATES HE CALLED TO GET A REFILL ON HIS XANAX AND WAS TOLD BY THE PHARMACY SOMEONE SIGNED AND PICKED UP HIS PRESCRIPTION ALREADY AND IT SURELY WASN'T HE OR HIS WIFE PLEASE CALL [phone] AND THEY ARE GOING TO THE PHARMACY NOW TO FIND OUT WHAT HAS HAPPENED.    [facility] [address]   PT CALLED BACK TO SAY THE PHARMACY STATED THEY ACCIDENTALLY GAVE HIS MEDS TO THE WRONG PERSON AND THEY DO HAVE HIS ON FILE, PT WOULD LIKE TO KNOW IF DR Kensington DID CALL IN HIS MEDICINE ON Friday. PLEASE CALL [phone] BECAUSE HE DOES FEEL LIKE SOMETHING IS GOING ON

## 2013-03-20 NOTE — Telephone Encounter (Signed)
Assured pt his rx was called in by Dr Edilia Bo and the mistake was on the pharmacy end.

## 2013-06-20 ENCOUNTER — Other Ambulatory Visit: Payer: Self-pay | Admitting: Family Medicine

## 2013-06-20 DIAGNOSIS — F411 Generalized anxiety disorder: Secondary | ICD-10-CM

## 2013-07-25 ENCOUNTER — Other Ambulatory Visit: Payer: Self-pay | Admitting: Family Medicine

## 2013-07-25 DIAGNOSIS — F411 Generalized anxiety disorder: Secondary | ICD-10-CM

## 2013-07-26 ENCOUNTER — Telehealth: Payer: Self-pay

## 2013-07-26 NOTE — Telephone Encounter (Signed)
Lmom notifying pt that his Xanax rx is ready to be picked up at the pharmacy. I also notified him that he needs to have an office visit to see Dr. Lorelei Pont within the next month or so.

## 2013-07-26 NOTE — Telephone Encounter (Signed)
Will do one refill but but had my CMA call pt and ask him to come in for a recheck as he is overdue

## 2013-09-17 ENCOUNTER — Other Ambulatory Visit: Payer: Self-pay | Admitting: Family Medicine

## 2013-09-22 ENCOUNTER — Telehealth: Payer: Self-pay

## 2013-09-22 DIAGNOSIS — F411 Generalized anxiety disorder: Secondary | ICD-10-CM

## 2013-09-22 NOTE — Telephone Encounter (Signed)
The patient called about receiving a refill for Alprazolam.  He said that [facility] has sent in several requests to our office for a refill.  I told the patient that Alprazolam requires an OV, as noted on the medication details, and that was probably why it was not getting refilled.  He said that his car has not been working, and he has had to rely on rides from family/friends.  He said that came here a few times for an OV, but he did not stay because his ride refused to stay for the duration of the wait time.  I told him that he could make an appointment.  He said that he had an appointment at [facility] once, and there was a problem with a bill/insurance, so he did not want to use the appointment center again.  He asked if he could possibly receive a partial refill until he is able to come in for an OV in September, when he plans to get his car fixed.  Please advise.  Thank you.  CB#: [phone]

## 2013-09-24 MED ORDER — ALPRAZOLAM 1 MG PO TABS: ORAL_TABLET | ORAL | Status: DC

## 2013-09-25 ENCOUNTER — Ambulatory Visit (INDEPENDENT_AMBULATORY_CARE_PROVIDER_SITE_OTHER): Payer: Self-pay | Admitting: Family Medicine

## 2013-09-25 VITALS — BP 136/88 | HR 60 | Temp 98.3°F | Resp 18 | Ht 75.0 in | Wt 202.2 lb

## 2013-09-25 DIAGNOSIS — R142 Eructation: Secondary | ICD-10-CM

## 2013-09-25 DIAGNOSIS — R141 Gas pain: Secondary | ICD-10-CM

## 2013-09-25 DIAGNOSIS — R140 Abdominal distension (gaseous): Secondary | ICD-10-CM

## 2013-09-25 DIAGNOSIS — F4323 Adjustment disorder with mixed anxiety and depressed mood: Secondary | ICD-10-CM

## 2013-09-25 DIAGNOSIS — F411 Generalized anxiety disorder: Secondary | ICD-10-CM

## 2013-09-25 DIAGNOSIS — R197 Diarrhea, unspecified: Secondary | ICD-10-CM

## 2013-09-25 DIAGNOSIS — R143 Flatulence: Secondary | ICD-10-CM

## 2013-09-25 LAB — COMPREHENSIVE METABOLIC PANEL
ALT: 34 U/L (ref 0–53)
AST: 22 U/L (ref 0–37)
Albumin: 4.8 g/dL (ref 3.5–5.2)
Alkaline Phosphatase: 65 U/L (ref 39–117)
BUN: 9 mg/dL (ref 6–23)
CALCIUM: 10.2 mg/dL (ref 8.4–10.5)
CHLORIDE: 100 meq/L (ref 96–112)
CO2: 28 meq/L (ref 19–32)
Creat: 0.94 mg/dL (ref 0.50–1.35)
Glucose, Bld: 96 mg/dL (ref 70–99)
POTASSIUM: 4.3 meq/L (ref 3.5–5.3)
SODIUM: 137 meq/L (ref 135–145)
TOTAL PROTEIN: 7.9 g/dL (ref 6.0–8.3)
Total Bilirubin: 0.6 mg/dL (ref 0.2–1.2)

## 2013-09-25 MED ORDER — ALPRAZOLAM 1 MG PO TABS: ORAL_TABLET | ORAL | Status: DC

## 2013-09-25 NOTE — Patient Instructions (Signed)
I will be in touch with your labs asap.  Let me know if you are getting worse or have any change in your symptoms

## 2013-09-25 NOTE — Progress Notes (Addendum)
[facility] [address], [place] [address] [phone]  Date:  09/25/2013   Name:  Stuart Hill   DOB:  07/13/1970   MRN:  [id number]  PCP:  Jenny Reichmann, MD    Chief Complaint: Medication Refill and Abdominal Pain   History of Present Illness:  Stuart Hill is a 43 y.o. very pleasant male patient who presents with the following:  Here today to follow-up anxiety.  He also notes GI issues "for months."  He will feel nauseated and vomit up "acid and mucus," and will sometimes gag.  He has noted that his stomach is bloated, he feels tired and not himself.  He has noted loose stools and some diarrhea- however not severe.  He has noted these sx for about 3 months.   He feels that his belly is bloated and big- sometimes he notes a lot of gas and burping as well.  His wife [name] has also noted diarrhea for several weeks. [place], [place].     Admits that he and [name] have been sharing at least a 6-pack of 8% beer with some sort of purple dye in it each day for some time.  He wonders if this could be related to their sx  Wt Readings from Last 3 Encounters:  09/25/13 202 lb 3.2 oz (91.717 kg)  01/09/13 200 lb (90.719 kg)  11/11/12 196 lb 6.4 oz (89.086 kg)     There are no active problems to display for this patient.   Past Medical History  Diagnosis Date  . Allergy   . Arthritis   . Hypertension   . Anxiety     History reviewed. No pertinent past surgical history.  History  Substance Use Topics  . Smoking status: Current Every Day Smoker -- 1.00 packs/day for 24 years    Types: Cigarettes  . Smokeless tobacco: Not on file  . Alcohol Use: 15.0 oz/week    25 Cans of beer per week    Family History  Problem Relation Age of Onset  . COPD Mother   . Fibromyalgia Mother   . Heart disease Father   . Depression Brother   . Heart disease Maternal Grandmother   . Stroke Maternal Grandmother   . Heart disease  Maternal Grandfather   . Stroke Maternal Grandfather   . Heart disease Paternal Grandmother   . Stroke Paternal Grandmother   . Heart disease Paternal Grandfather   . Stroke Paternal Grandfather     Allergies  Allergen Reactions  . Advil [Ibuprofen] Hives  . Afrin [Nasal Spray] Hives  . Antihistamines, Chlorpheniramine-Type   . Atrovent [Ipratropium] Hives  . Bc Powder [Aspirin-Salicylamide-Caffeine]   . Benadryl [Diphenhydramine Hcl] Hives  . Codeine Hives  . Gastromax [Metoclopramide]   . Morphine And Related Hives  . [name] [Carisoprodol] Hives  . Tylenol [Acetaminophen] Hives    Medication list has been reviewed and updated.  Current Outpatient Prescriptions on File Prior to Visit  Medication Sig Dispense Refill  . ALPRAZolam (XANAX) 1 MG tablet Take 1 tablet by mouth TID prn.  There will be no further refills without an office visit  45 tablet  0   No current facility-administered medications on file prior to visit.    Review of Systems:  As per HPI- otherwise negative.   Physical Examination: Filed Vitals:   09/25/13 1417  BP: 136/88  Pulse: 60  Temp: 98.3 F (36.8 C)  Resp:  Dickinson:   09/25/13 1417  Height: 6\' 3"  (1.905 m)  Weight: 202 lb 3.2 oz (91.717 kg)   Body mass index is 25.27 kg/(m^2). Ideal Body Weight: Weight in (lb) to have BMI = 25: 199.6  GEN: WDWN, NAD, Non-toxic, A & O x 3, looks well HEENT: Atraumatic, Normocephalic. Neck supple. No masses, No LAD. Ears and Nose: No external deformity. CV: RRR, No M/G/R. No JVD. No thrill. No extra heart sounds. PULM: CTA B, no wheezes, crackles, rhonchi. No retractions. No resp. distress. No accessory muscle use. ABD: S, NT, ND, +BS. No rebound. No HSM. EXTR: No c/c/e NEURO Normal gait.  PSYCH: Normally interactive. Conversant. Not depressed or anxious appearing.  Calm demeanor.    Assessment and Plan: Diarrhea - Plan: Comprehensive metabolic panel, Ova and parasite  examination  Bloating  Adjustment disorder with mixed anxiety and depressed mood  GAD (generalized anxiety disorder) - Plan: ALPRAZolam (XANAX) 1 MG tablet  Refilled Ethin's xanax today.  Advised him that he and [name] both need to drink less- he should have no more than 2 drinks a day, and should not combine this with his xanax.  He agrees and they plan to cut down.  Will check stool to make sure Giardia is not an issue, labs as above.  If all negative consider GI referral  Signed Lamar Blinks, MD  addnd 9/4: called and spoke to [place] labs look good  Results for orders placed in visit on 09/25/13  OVA AND PARASITE EXAMINATION      Result Value Ref Range   OP No Ova or Parasites Seen     COMPREHENSIVE METABOLIC PANEL      Result Value Ref Range   Sodium 137  135 - 145 mEq/L   Potassium 4.3  3.5 - 5.3 mEq/L   Chloride 100  96 - 112 mEq/L   CO2 28  19 - 32 mEq/L   Glucose, Bld 96  70 - 99 mg/dL   BUN 9  6 - 23 mg/dL   Creat 0.94  0.50 - 1.35 mg/dL   Total Bilirubin 0.6  0.2 - 1.2 mg/dL   Alkaline Phosphatase 65  39 - 117 U/L   AST 22  0 - 37 U/L   ALT 34  0 - 53 U/L   Total Protein 7.9  6.0 - 8.3 g/dL   Albumin 4.8  3.5 - 5.2 g/dL   Calcium 10.2  8.4 - 10.5 mg/dL   They will try probiotics (as well as cutting down on alcohol) and let me know if not better in about 2 weeks.  In that case refer to GI

## 2013-09-27 LAB — OVA AND PARASITE EXAMINATION: OP: NONE SEEN

## 2013-09-29 ENCOUNTER — Encounter: Payer: Self-pay | Admitting: Family Medicine

## 2013-10-01 ENCOUNTER — Encounter: Payer: Self-pay | Admitting: Family Medicine

## 2014-01-11 ENCOUNTER — Ambulatory Visit (INDEPENDENT_AMBULATORY_CARE_PROVIDER_SITE_OTHER): Payer: Self-pay | Admitting: Family Medicine

## 2014-01-11 VITALS — BP 142/92 | HR 72 | Temp 97.8°F | Resp 16 | Ht 75.0 in | Wt 206.0 lb

## 2014-01-11 DIAGNOSIS — M5440 Lumbago with sciatica, unspecified side: Secondary | ICD-10-CM

## 2014-01-11 DIAGNOSIS — R1030 Lower abdominal pain, unspecified: Secondary | ICD-10-CM

## 2014-01-11 DIAGNOSIS — A630 Anogenital (venereal) warts: Secondary | ICD-10-CM

## 2014-01-11 DIAGNOSIS — M542 Cervicalgia: Secondary | ICD-10-CM

## 2014-01-11 DIAGNOSIS — R350 Frequency of micturition: Secondary | ICD-10-CM

## 2014-01-11 DIAGNOSIS — N3943 Post-void dribbling: Secondary | ICD-10-CM

## 2014-01-11 LAB — POCT URINALYSIS DIPSTICK
BILIRUBIN UA: NEGATIVE
Glucose, UA: NEGATIVE
Ketones, UA: NEGATIVE
Leukocytes, UA: NEGATIVE
NITRITE UA: NEGATIVE
PH UA: 7
Protein, UA: NEGATIVE
RBC UA: NEGATIVE
Spec Grav, UA: 1.01
UROBILINOGEN UA: 0.2

## 2014-01-11 LAB — IFOBT (OCCULT BLOOD): IFOBT: NEGATIVE

## 2014-01-11 LAB — POCT UA - MICROSCOPIC ONLY
BACTERIA, U MICROSCOPIC: NEGATIVE
CASTS, UR, LPF, POC: NEGATIVE
CRYSTALS, UR, HPF, POC: NEGATIVE
Epithelial cells, urine per micros: NEGATIVE
Mucus, UA: NEGATIVE
RBC, urine, microscopic: NEGATIVE
WBC, Ur, HPF, POC: NEGATIVE
Yeast, UA: NEGATIVE

## 2014-01-11 MED ORDER — TAMSULOSIN HCL 0.4 MG PO CAPS: 0.4000 mg | ORAL_CAPSULE | Freq: Every day | ORAL | Status: DC

## 2014-01-11 MED ORDER — METAXALONE 800 MG PO TABS: 800.0000 mg | ORAL_TABLET | Freq: Three times a day (TID) | ORAL | Status: DC

## 2014-01-11 NOTE — Patient Instructions (Signed)
Take the Flomax 1 each night before bedtime  Continue to drink plenty of fluids to flush her bladder out well.  Take Tylenol for pain  Return some time if you would like me to freeze the warts from the penis  We will make a referral for an MRI of your back when you are ready  If the headaches get worse please return

## 2014-01-11 NOTE — Progress Notes (Signed)
Subjective: Patient is here for several issues. He's been having a lot of trouble with emptying his bladder. It feels like it is not ENT after he finishes voiding. He has dribbling. He will trip and then leak. This become more more of a problem and he is having to wear 2 sets of underwear to catch the drips. He has been having chronic low back pain which got worse. He has numbness and tingling in his feet. He gets stuck partially bent over where he just can't straighten back up. He's had problems with his neck hurting him. He has a history of cervical disc disease. He been having bad headache today which is unusual for him. He has genital warts, which had been there for a long time. He is given medication for a few years ago and never got it filled due to cost of it. He would like some not present off.  Objective: Has a headache badly right now. His neck was supple but tender right in the midline of the low neck. No real tenderness of his back but pain with flexion and difficulty standing back up. He can tell time to side to side and leaning backward well. He has warts on the penis, 2 distally and a cluster of about 5 or 6 proximally and the hair shaft. Them was normal with prostate gland normal. Heme test done  Assessment: Low back pain with numbness/abnormal sensation in feet Cervical pain Genital warts Urinary hesitancy and dribbling Suprapubic pain Headache  Plan: Try some Flomax. If it helps we could continue it if necessary. However probably should see a urologist at some point if he keeps having symptoms. But he needs an MRI of his back with the radicular pain. He will check and see what is the cheapest place he can get it.  Tylenol for pain. He is allergic to a lot of medicines. He got hives from NSAIDs. Decided to avoid narcotics for now.  He will contact me if we need to refer to urology or order the MRI.

## 2014-01-22 ENCOUNTER — Other Ambulatory Visit: Payer: Self-pay | Admitting: Family Medicine

## 2014-03-05 ENCOUNTER — Ambulatory Visit (INDEPENDENT_AMBULATORY_CARE_PROVIDER_SITE_OTHER): Payer: Self-pay | Admitting: Emergency Medicine

## 2014-03-05 VITALS — BP 128/82 | HR 75 | Temp 98.3°F | Resp 18 | Ht 75.0 in | Wt 206.8 lb

## 2014-03-05 DIAGNOSIS — F411 Generalized anxiety disorder: Secondary | ICD-10-CM

## 2014-03-05 DIAGNOSIS — R6889 Other general symptoms and signs: Secondary | ICD-10-CM

## 2014-03-05 LAB — POCT INFLUENZA A/B
INFLUENZA A, POC: NEGATIVE
Influenza B, POC: NEGATIVE

## 2014-03-05 MED ORDER — DOXYCYCLINE HYCLATE 100 MG PO TABS: 100.0000 mg | ORAL_TABLET | Freq: Two times a day (BID) | ORAL | Status: DC

## 2014-03-05 NOTE — Progress Notes (Addendum)
Subjective:  This chart was scribed for Darlyne Russian, MD by Ladene Artist, ED Scribe. The patient was seen in room 5. Patient's care was started at 10:40 AM.   Patient ID: Stuart Hill, male    DOB: [birth year], 44 y.o.   MRN: [id number]  Chief Complaint  Patient presents with  . Generalized Body Aches    x1 week and more  . Fever  . Chills  . Fatigue  . Cough    green sputum    HPI HPI Comments: Stuart Hill is a 44 y.o. male, with a h/o HTN, anxiety, who presents to the [facility] complaining of persistent productive cough with green/yellow colored sputum for the past week. Pt reports associated generalized body aches, congestion, bilateral ear fullness, sweats, chills, fatigue, nosebleeds. He denies fever. Pt did not receive a flu vaccine this year.   Urinary Frequency Pt was seen on 01/11/2014 by Dr. Linna Darner for urinary frequency and lower back pain. Pt states that he was urinating x10 every hour but symptoms have resolved. Dr. Linna Darner recommended a MRI but pt did not have one due to lack of insurance.   Past Medical History  Diagnosis Date  . Allergy   . Arthritis   . Hypertension   . Anxiety    Current Outpatient Prescriptions on File Prior to Visit  Medication Sig Dispense Refill  . ALPRAZolam (XANAX) 1 MG tablet Take one tablet by mouth three times a day as needed 90 tablet 1   No current facility-administered medications on file prior to visit.   Allergies  Allergen Reactions  . Advil [Ibuprofen] Hives  . Afrin [Nasal Spray] Hives  . Antihistamines, Chlorpheniramine-Type   . Atrovent [Ipratropium] Hives  . Bc Powder [Aspirin-Salicylamide-Caffeine]   . Benadryl [Diphenhydramine Hcl] Hives  . Codeine Hives  . Gastromax [Metoclopramide]   . Morphine And Related Hives  . [name] [Carisoprodol] Hives  . Tylenol [Acetaminophen] Hives   Review of Systems  Constitutional: Positive for chills and fatigue. Negative for fever.  HENT: Positive  for congestion and nosebleeds.   Respiratory: Positive for cough.   Genitourinary: Negative for frequency.  Musculoskeletal: Positive for myalgias. Negative for back pain.      Objective:   Physical Exam CONSTITUTIONAL: Well developed/well nourished HEAD: Normocephalic/atraumatic EYES: EOMI/PERRL ENMT: Mucous membranes moist, nasal congestion NECK: supple no meningeal signs SPINE/BACK:entire spine nontender CV: S1/S2 noted, no murmurs/rubs/gallops noted LUNGS: No apparent distress, few rhonchi  ABDOMEN: soft, nontender, no rebound or guarding, bowel sounds noted throughout abdomen GU:no cva tenderness NEURO: Pt is awake/alert/appropriate, moves all extremitiesx4.  No facial droop.   EXTREMITIES: pulses normal/equal, full ROM SKIN: warm, color normal PSYCH: no abnormalities of mood noted, alert and oriented to situation    Results for orders placed or performed in visit on 03/05/14  POCT Influenza A/B  Result Value Ref Range   Influenza A, POC Negative    Influenza B, POC Negative    Meds ordered this encounter  Medications  . doxycycline (VIBRA-TABS) 100 MG tablet    Sig: Take 1 tablet (100 mg total) by mouth 2 (two) times daily.    Dispense:  20 tablet    Refill:  0   Assessment & Plan:  Flu test is negative. I feel he is about to the end of his illness. He is allergic to many many drugs. Will have him take  Doxy to cover for bronchitis at risk due to his smoking..I personally performed the  services described in this documentation, which was scribed in my presence. The recorded information has been reviewed and is accurate.

## 2014-04-02 ENCOUNTER — Other Ambulatory Visit: Payer: Self-pay | Admitting: Family Medicine

## 2014-04-02 DIAGNOSIS — F411 Generalized anxiety disorder: Secondary | ICD-10-CM

## 2014-05-14 ENCOUNTER — Other Ambulatory Visit: Payer: Self-pay | Admitting: Family Medicine

## 2014-07-27 ENCOUNTER — Other Ambulatory Visit: Payer: Self-pay | Admitting: Family Medicine

## 2014-07-27 NOTE — Telephone Encounter (Signed)
Reviewed database- he last filled an rx in May, no rx from anyone but myself  Meds ordered this encounter  Medications  . ALPRAZolam (XANAX) 1 MG tablet    Sig: TAKE 1 TAB BY MOUTH 3 TIMES DAILY AS NEEDED FOR ANXIETY    Dispense:  90 tablet    Refill:  0    Not to exceed 5 additional fills before 11/11/2014.  '

## 2014-09-20 ENCOUNTER — Other Ambulatory Visit: Payer: Self-pay | Admitting: Family Medicine

## 2014-09-20 ENCOUNTER — Ambulatory Visit (INDEPENDENT_AMBULATORY_CARE_PROVIDER_SITE_OTHER): Payer: Self-pay | Admitting: Family Medicine

## 2014-09-20 VITALS — BP 120/78 | HR 76 | Temp 98.2°F | Resp 18 | Ht 76.0 in | Wt 209.0 lb

## 2014-09-20 DIAGNOSIS — R5382 Chronic fatigue, unspecified: Secondary | ICD-10-CM

## 2014-09-20 DIAGNOSIS — R202 Paresthesia of skin: Secondary | ICD-10-CM

## 2014-09-20 LAB — TSH: TSH: 0.866 u[IU]/mL (ref 0.350–4.500)

## 2014-09-20 LAB — COMPREHENSIVE METABOLIC PANEL
ALBUMIN: 4.3 g/dL (ref 3.6–5.1)
ALT: 25 U/L (ref 9–46)
AST: 19 U/L (ref 10–40)
Alkaline Phosphatase: 56 U/L (ref 40–115)
BUN: 10 mg/dL (ref 7–25)
CHLORIDE: 104 mmol/L (ref 98–110)
CO2: 25 mmol/L (ref 20–31)
Calcium: 9 mg/dL (ref 8.6–10.3)
Creat: 0.89 mg/dL (ref 0.60–1.35)
Glucose, Bld: 81 mg/dL (ref 65–99)
POTASSIUM: 4.2 mmol/L (ref 3.5–5.3)
Sodium: 141 mmol/L (ref 135–146)
TOTAL PROTEIN: 7 g/dL (ref 6.1–8.1)
Total Bilirubin: 0.5 mg/dL (ref 0.2–1.2)

## 2014-09-20 LAB — POCT CBC
Granulocyte percent: 75.2 %G (ref 37–80)
HCT, POC: 53.2 % (ref 43.5–53.7)
Hemoglobin: 17 g/dL (ref 14.1–18.1)
Lymph, poc: 2 (ref 0.6–3.4)
MCH, POC: 29.6 pg (ref 27–31.2)
MCHC: 32 g/dL (ref 31.8–35.4)
MCV: 92.3 fL (ref 80–97)
MID (CBC): 0.3 (ref 0–0.9)
MPV: 8.6 fL (ref 0–99.8)
POC GRANULOCYTE: 7.1 — AB (ref 2–6.9)
POC LYMPH %: 21.3 % (ref 10–50)
POC MID %: 3.5 %M (ref 0–12)
Platelet Count, POC: 220 10*3/uL (ref 142–424)
RBC: 5.75 M/uL (ref 4.69–6.13)
RDW, POC: 13 %
WBC: 9.4 10*3/uL (ref 4.6–10.2)

## 2014-09-20 LAB — GLUCOSE, POCT (MANUAL RESULT ENTRY): POC Glucose: 80 mg/dl (ref 70–99)

## 2014-09-20 NOTE — Progress Notes (Addendum)
[facility] [address] [place]  [address] [phone]  Date:  09/20/2014   Name:  Stuart Hill   DOB:  July 20, 1970   MRN:  [id number]  PCP:  Jenny Reichmann, MD    Chief Complaint: Fatigue; Dizziness; Hypertension; and Medication Refill   History of Present Illness:  Stuart Hill is a 44 y.o. very pleasant male patient who presents with the following:  Pt with a long history of anxiety.  Here today with complaint of "red eyes a lot, like I've been smoking weed" but he has not.  He is using visine a lot- daily as of late.  He does not wear contacts. Vision is ok  There is a lot of heart disease in his family which worries him some.  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   He is concerned that he has felt very tired, he is sleeping a lot.  He feels kind of spacey, "like my brain is asleep," has noted that his feel seem to be going numb.  He notes that both feet sometimes feel numb just while he is in the shower.   He will sometimes feel like the "breath is being pulled out of me.'   He may have a dull headache off n on.    He checked his BP this am at [facility]- it was 150/97.  He does not have chest pain but may have palpitations; he notes these when he is at rest .    He did do a stress test several years ago- it was ok per his report  He uses xanax regularly - 1mg  TID.   His use is stable for the long term.  He does not want to increase use  He states that he is not sleeping well at night- he will often wake up and then not be able to get back to sleep  He has tried trazodone in the past, but did not really like it BP Readings from Last 3 Encounters:  09/20/14 120/78  03/05/14 128/82  01/11/14 142/92     Patient Active Problem List   Diagnosis Date Noted  . Generalized anxiety disorder 03/05/2014    Past Medical History  Diagnosis Date  . Allergy   . Arthritis   . Hypertension   . Anxiety    . Hyperlipidemia     Past Surgical History  Procedure Laterality Date  . Dental surgery    . Hand surgery      Social History  Substance Use Topics  . Smoking status: Current Every Day Smoker -- 1.00 packs/day for 24 years    Types: Cigarettes  . Smokeless tobacco: None  . Alcohol Use: 15.0 oz/week    25 Cans of beer per week    Family History  Problem Relation Age of Onset  . COPD Mother   . Fibromyalgia Mother   . Heart disease Father   . Depression Brother   . Heart disease Maternal Grandmother   . Stroke Maternal Grandmother   . Heart disease Maternal Grandfather   . Stroke Maternal Grandfather   . Heart disease Paternal Grandmother   . Stroke Paternal Grandmother   . Heart disease Paternal Grandfather   . Stroke Paternal Grandfather     Allergies  Allergen Reactions  . Advil [Ibuprofen] Hives  . Afrin [Nasal Spray] Hives  . Antihistamines, Chlorpheniramine-Type   . Atrovent [Ipratropium] Hives  .  Bc Powder [Aspirin-Salicylamide-Caffeine]   . Benadryl [Diphenhydramine Hcl] Hives  . Codeine Hives  . Gastromax [Metoclopramide]   . Morphine And Related Hives  . [name] [Carisoprodol] Hives  . Tylenol [Acetaminophen] Hives    Medication list has been reviewed and updated.  Current Outpatient Prescriptions on File Prior to Visit  Medication Sig Dispense Refill  . ALPRAZolam (XANAX) 1 MG tablet TAKE 1 TAB BY MOUTH 3 TIMES DAILY AS NEEDED FOR ANXIETY 90 tablet 0   No current facility-administered medications on file prior to visit.    Review of Systems:  As per HPI- otherwise negative.   Physical Examination: Filed Vitals:   09/20/14 1106  BP: 120/78  Pulse: 76  Temp: 98.2 F (36.8 C)  Resp: 18   Filed Vitals:   09/20/14 1106  Height: 6\' 4"  (1.93 m)  Weight: 209 lb (94.802 kg)   Body mass index is 25.45 kg/(m^2). Ideal Body Weight: Weight in (lb) to have BMI = 25: 205  GEN: WDWN, NAD, Non-toxic, A & O x 3, looks well, here with his wife  [name] HEENT: Atraumatic, Normocephalic. Neck supple. No masses, No LAD. Ears and Nose: No external deformity. CV: RRR, No M/G/R. No JVD. No thrill. No extra heart sounds. PULM: CTA B, no wheezes, crackles, rhonchi. No retractions. No resp. distress. No accessory muscle use. EXTR: No c/c/e NEURO Normal gait.  PSYCH: Normally interactive. Conversant. Not depressed or anxious appearing.  Calm demeanor.   Results for orders placed or performed in visit on 09/20/14  Comprehensive metabolic panel  Result Value Ref Range   Sodium 141 135 - 146 mmol/L   Potassium 4.2 3.5 - 5.3 mmol/L   Chloride 104 98 - 110 mmol/L   CO2 25 20 - 31 mmol/L   Glucose, Bld 81 65 - 99 mg/dL   BUN 10 7 - 25 mg/dL   Creat 0.89 0.60 - 1.35 mg/dL   Total Bilirubin 0.5 0.2 - 1.2 mg/dL   Alkaline Phosphatase 56 40 - 115 U/L   AST 19 10 - 40 U/L   ALT 25 9 - 46 U/L   Total Protein 7.0 6.1 - 8.1 g/dL   Albumin 4.3 3.6 - 5.1 g/dL   Calcium 9.0 8.6 - 10.3 mg/dL  TSH  Result Value Ref Range   TSH 0.866 0.350 - 4.500 uIU/mL  POCT CBC  Result Value Ref Range   WBC 9.4 4.6 - 10.2 K/uL   Lymph, poc 2.0 0.6 - 3.4   POC LYMPH PERCENT 21.3 10 - 50 %L   MID (cbc) 0.3 0 - 0.9   POC MID % 3.5 0 - 12 %M   POC Granulocyte 7.1 (A) 2 - 6.9   Granulocyte percent 75.2 37 - 80 %G   RBC 5.75 4.69 - 6.13 M/uL   Hemoglobin 17.0 14.1 - 18.1 g/dL   HCT, POC 53.2 43.5 - 53.7 %   MCV 92.3 80 - 97 fL   MCH, POC 29.6 27 - 31.2 pg   MCHC 32.0 31.8 - 35.4 g/dL   RDW, POC 13.0 %   Platelet Count, POC 220 142 - 424 K/uL   MPV 8.6 0 - 99.8 fL  POCT glucose (manual entry)  Result Value Ref Range   POC Glucose 80 70 - 99 mg/dl    Assessment and Plan: Chronic fatigue - Plan: POCT CBC, POCT glucose (manual entry), Comprehensive metabolic panel, Testosterone  Paresthesia of lower extremity - Plan: TSH  Here today with fatigue and vague sx  of malaise.   Await labs as above and will be back in touch with him He would like to check  his testosterone but he was seen around noon- would be better to do earlier in the day. He will do this as a future order He will try some melatonin for sleep Educated that persistent visine use will make his eyes redder- he will stop using this and just use "tears" drops if needed  Signed Lamar Blinks, MD  Called with labs on 8/29.  So far all ok He will come in and do a T level at some point in the am

## 2014-09-20 NOTE — Patient Instructions (Addendum)
Stop using the visine, I think this is making your eyes more red.  You can use a "natural tears" type drop for moisturizing. I will be in touch with the rest of your labs For the time being I do not think that you need blood pressure medications  Come in at your convenience (early morning) in the next week or so and we will check your testosterone level  Try using some melatonin at bedtime to help yourself sleep

## 2014-09-24 ENCOUNTER — Encounter: Payer: Self-pay | Admitting: Family Medicine

## 2014-10-30 ENCOUNTER — Encounter: Payer: Self-pay | Admitting: Emergency Medicine

## 2014-11-06 ENCOUNTER — Other Ambulatory Visit: Payer: Self-pay | Admitting: Family Medicine

## 2014-11-07 NOTE — Telephone Encounter (Signed)
Faxed

## 2014-12-06 ENCOUNTER — Ambulatory Visit (INDEPENDENT_AMBULATORY_CARE_PROVIDER_SITE_OTHER): Payer: Self-pay | Admitting: Family Medicine

## 2014-12-06 VITALS — BP 112/80 | HR 86 | Temp 97.7°F | Resp 18 | Ht 76.0 in | Wt 206.8 lb

## 2014-12-06 DIAGNOSIS — J0100 Acute maxillary sinusitis, unspecified: Secondary | ICD-10-CM

## 2014-12-06 DIAGNOSIS — H6591 Unspecified nonsuppurative otitis media, right ear: Secondary | ICD-10-CM

## 2014-12-06 MED ORDER — AMOXICILLIN-POT CLAVULANATE 875-125 MG PO TABS: 1.0000 | ORAL_TABLET | Freq: Two times a day (BID) | ORAL | Status: DC

## 2014-12-06 NOTE — Progress Notes (Signed)
This chart was scribed for Stuart Haber, MD by Stuart Hill, medical scribe at [facility].The patient was seen in exam room 5 and the patient's care was started at 12:59 PM.  Patient ID: Stuart Hill MRN: [id number], DOB: 1970/11/11, 44 y.o. Date of Encounter: 12/06/2014  Primary Physician: Stuart Reichmann, MD  Chief Complaint:  Chief Complaint  Patient presents with  . Ear Fullness    right ear  . Cough  . Fatigue    HPI:  Stuart Hill is a 44 y.o. male who presents to [facility] complaining of right ear fullness, productive cough (yellow green), and fatigue. Last night, he had a fever with Tmax 101. His wife had similar symptoms for 2 weeks.   He works as a Development worker, international aid.   Past Medical History  Diagnosis Date  . Allergy   . Arthritis   . Hypertension   . Anxiety   . Hyperlipidemia      Home Meds: Prior to Admission medications   Medication Sig Start Date End Date Taking? Authorizing Provider  ALPRAZolam Duanne Moron) 1 MG tablet TAKE 1 TABLET BY MOUTH 3 TIMES A DAY AS NEEDED FOR ANXIETY 11/07/14  Yes Stuart Russian, MD    Allergies:  Allergies  Allergen Reactions  . Advil [Ibuprofen] Hives  . Afrin [Nasal Spray] Hives  . Antihistamines, Chlorpheniramine-Type   . Atrovent [Ipratropium] Hives  . Bc Powder [Aspirin-Salicylamide-Caffeine]   . Benadryl [Diphenhydramine Hcl] Hives  . Codeine Hives  . Gastromax [Metoclopramide]   . Morphine And Related Hives  . [name] [Carisoprodol] Hives  . Tylenol [Acetaminophen] Hives    Social History   Social History  . Marital Status: Married    Spouse Name: N/A  . Number of Children: N/A  . Years of Education: N/A   Occupational History  . Not on file.   Social History Main Topics  . Smoking status: Current Every Day Smoker -- 1.00 packs/day for 24 years    Types: Cigarettes  . Smokeless tobacco: Not on file  . Alcohol Use: 15.0 oz/Hill    25 Cans of beer per Hill  . Drug  Use: Not on file  . Sexual Activity: Not on file   Other Topics Concern  . Not on file   Social History Narrative     Review of Systems: Constitutional: negative for chills, night sweats, weight changes; positive for fever, fatigue HEENT: negative for vision changes, hearing loss, congestion, rhinorrhea, ST, epistaxis; positive for sinus pressure, ear pain (right) Cardiovascular: negative for chest pain or palpitations Respiratory: negative for hemoptysis, wheezing, shortness of breath; positive for cough Abdominal: negative for abdominal pain, nausea, vomiting, diarrhea, or constipation Dermatological: negative for rash Neurologic: negative for headache, dizziness, or syncope All other systems reviewed and are otherwise negative with the exception to those above and in the HPI.  Physical Exam: Hill pressure 112/80, pulse 86, temperature 97.7 F (36.5 C), temperature source Oral, resp. rate 18, height 6\' 4"  (1.93 m), weight 206 lb 12.8 oz (93.804 kg), SpO2 98 %., Body mass index is 25.18 kg/(m^2). General: Well developed, well nourished, in no acute distress. Head: Normocephalic, atraumatic, eyes without discharge, sclera non-icteric, nares are without discharge. Bilateral auditory canals clear; throat bright red, right tm red Neck: Supple. No thyromegaly. Full ROM. No lymphadenopathy. Lungs: Clear bilaterally to auscultation without wheezes, rales, or rhonchi. Breathing is unlabored. Heart: RRR with S1 S2. No murmurs, rubs, or gallops appreciated. Abdomen: Soft, non-tender,  non-distended with normoactive bowel sounds. No hepatomegaly. No rebound/guarding. No obvious abdominal masses. Msk:  Strength and tone normal for age. Extremities/Skin: Warm and dry. No clubbing or cyanosis. No edema. No rashes or suspicious lesions. Neuro: Alert and oriented X 3. Moves all extremities spontaneously. Gait is normal. CNII-XII grossly in tact. Psych:  Responds to questions appropriately with a  normal affect.     ASSESSMENT AND PLAN:  44 y.o. year old male with  This chart was scribed in my presence and reviewed by me personally.    ICD-9-CM ICD-10-CM   1. Acute maxillary sinusitis, recurrence not specified 461.0 J01.00 amoxicillin-clavulanate (AUGMENTIN) 875-125 MG tablet  2. Otitis media with effusion, right 381.4 H65.91 amoxicillin-clavulanate (AUGMENTIN) 875-125 MG tablet     Signed, Stuart Haber, MD   By signing my name below, I, Stuart Hill, attest that this documentation has been prepared under the direction and in the presence of Stuart Haber, MD. Electronically Signed: Moises Hill, Scribe. 12/06/2014 , 12:59 PM .  Signed, Stuart Haber, MD 12/06/2014 12:59 PM

## 2014-12-06 NOTE — Patient Instructions (Signed)

## 2014-12-13 ENCOUNTER — Ambulatory Visit (INDEPENDENT_AMBULATORY_CARE_PROVIDER_SITE_OTHER): Payer: Self-pay | Admitting: Family Medicine

## 2014-12-13 ENCOUNTER — Encounter: Payer: Self-pay | Admitting: Family Medicine

## 2014-12-13 VITALS — BP 122/86 | HR 75 | Temp 98.7°F | Ht 75.0 in | Wt 202.0 lb

## 2014-12-13 DIAGNOSIS — R05 Cough: Secondary | ICD-10-CM

## 2014-12-13 DIAGNOSIS — Z13220 Encounter for screening for lipoid disorders: Secondary | ICD-10-CM

## 2014-12-13 DIAGNOSIS — R059 Cough, unspecified: Secondary | ICD-10-CM

## 2014-12-13 DIAGNOSIS — R5381 Other malaise: Secondary | ICD-10-CM

## 2014-12-13 LAB — POCT CBC
Granulocyte percent: 79.7 %G (ref 37–80)
HCT, POC: 50.8 % (ref 43.5–53.7)
Hemoglobin: 17.9 g/dL (ref 14.1–18.1)
LYMPH, POC: 2 (ref 0.6–3.4)
MCH, POC: 31.3 pg — AB (ref 27–31.2)
MCHC: 35.2 g/dL (ref 31.8–35.4)
MCV: 89 fL (ref 80–97)
MID (cbc): 0.3 (ref 0–0.9)
MPV: 8.1 fL (ref 0–99.8)
PLATELET COUNT, POC: 286 10*3/uL (ref 142–424)
POC Granulocyte: 8.8 — AB (ref 2–6.9)
POC LYMPH %: 18 % (ref 10–50)
POC MID %: 2.3 %M (ref 0–12)
RBC: 5.71 M/uL (ref 4.69–6.13)
RDW, POC: 13 %
WBC: 11.1 10*3/uL — AB (ref 4.6–10.2)

## 2014-12-13 LAB — LIPID PANEL
Cholesterol: 206 mg/dL — ABNORMAL HIGH (ref 125–200)
HDL: 42 mg/dL (ref >=40)
LDL CALC: 128 mg/dL (ref <130)
Total CHOL/HDL Ratio: 4.9 Ratio (ref <=5.0)
Triglycerides: 180 mg/dL — ABNORMAL HIGH (ref <150)
VLDL: 36 mg/dL — ABNORMAL HIGH (ref <30)

## 2014-12-13 MED ORDER — AZITHROMYCIN 250 MG PO TABS: ORAL_TABLET | ORAL | Status: DC

## 2014-12-13 NOTE — Patient Instructions (Signed)
I will be in touch with your cholesterol panel asap- I will mail a copy to you We will change you to azithromycin as your symptoms are now more in your chest; stop the augmentin and start this instead.  Be sure to use a probiotic (either a drink or from the pharmacy) for at least a couple of weeks  If you start having more diarrhea please let me know Let me know if you are not feeling better in a couple of days- Sooner if worse.

## 2014-12-13 NOTE — Progress Notes (Signed)
[facility] [address], [place] [address] [phone]  Date:  12/13/2014   Name:  Stuart Hill   DOB:  06/08/1970   MRN:  [id number]  PCP:  Jenny Reichmann, MD    Chief Complaint: Follow-up; Otalgia; and Cough   History of Present Illness:  Stuart Hill is a 44 y.o. very pleasant male patient who presents with the following:  History of anxiety. Here today with illness- he was seen a week ago with right ear effusion and sinusitis treated with Augmentin for 10 days.    He is here today because he notes a lot more chest congestion, hoarseness, and he is now coughing up some green and sometimes pink mucus.  He also notes pain in both of his ears and feels like he can't hear very well He is coughging a good bit  He has noted some post- tussive emesis. He notes some mild loose stools after eating but not diarrhea He also does note a ST  His younger brother died of SCD about 2 months ago.  This was really unexpected and hard on the family He is fasting this am and would like to do a CHL check  Patient Active Problem List   Diagnosis Date Noted  . Generalized anxiety disorder 03/05/2014    Past Medical History  Diagnosis Date  . Allergy   . Arthritis   . Hypertension   . Anxiety   . Hyperlipidemia     Past Surgical History  Procedure Laterality Date  . Dental surgery    . Hand surgery      Social History  Substance Use Topics  . Smoking status: Current Every Day Smoker -- 1.00 packs/day for 24 years    Types: Cigarettes  . Smokeless tobacco: Never Used  . Alcohol Use: 15.0 oz/week    25 Cans of beer per week    Family History  Problem Relation Age of Onset  . COPD Mother   . Fibromyalgia Mother   . Heart disease Father   . Depression Brother   . Heart disease Maternal Grandmother   . Stroke Maternal Grandmother   . Heart disease Maternal Grandfather   . Stroke Maternal Grandfather   . Heart disease Paternal Grandmother   .  Stroke Paternal Grandmother   . Heart disease Paternal Grandfather   . Stroke Paternal Grandfather     Allergies  Allergen Reactions  . Advil [Ibuprofen] Hives  . Afrin [Nasal Spray] Hives  . Antihistamines, Chlorpheniramine-Type   . Atrovent [Ipratropium] Hives  . Bc Powder [Aspirin-Salicylamide-Caffeine]   . Benadryl [Diphenhydramine Hcl] Hives  . Codeine Hives  . Gastromax [Metoclopramide]   . Morphine And Related Hives  . [name] [Carisoprodol] Hives  . Tylenol [Acetaminophen] Hives    Medication list has been reviewed and updated.  Current Outpatient Prescriptions on File Prior to Visit  Medication Sig Dispense Refill  . ALPRAZolam (XANAX) 1 MG tablet TAKE 1 TABLET BY MOUTH 3 TIMES A DAY AS NEEDED FOR ANXIETY 90 tablet 0  . amoxicillin-clavulanate (AUGMENTIN) 875-125 MG tablet Take 1 tablet by mouth 2 (two) times daily. 20 tablet 0   No current facility-administered medications on file prior to visit.    Review of Systems:  As per HPI- otherwise negative.   Physical Examination: Filed Vitals:   12/13/14 0823  BP: 122/86  Pulse: 75  Temp: 98.7 F (37.1 C)   Filed Vitals:   12/13/14 0823  Height: 6\' 3"  (  1.905 m)  Weight: 202 lb (91.627 kg)   Body mass index is 25.25 kg/(m^2). Ideal Body Weight: Weight in (lb) to have BMI = 25: 199.6  GEN: WDWN, NAD, Non-toxic, A & O x 3, looks well HEENT: Atraumatic, Normocephalic. Neck supple. No masses, No LAD.  Bilateral TM wnl, oropharynx normal.  PEERL,EOMI.   Ears and Nose: No external deformity. CV: RRR, No M/G/R. No JVD. No thrill. No extra heart sounds. PULM: CTA B, no wheezes, crackles, rhonchi. No retractions. No resp. distress. No accessory muscle use. ABD: S, NT, ND, +BS. No rebound. No HSM. EXTR: No c/c/e NEURO Normal gait.  PSYCH: Normally interactive. Conversant. Not depressed or anxious appearing.  Calm demeanor.   Results for orders placed or performed in visit on 12/13/14  POCT CBC  Result Value Ref  Range   WBC 11.1 (A) 4.6 - 10.2 K/uL   Lymph, poc 2.0 0.6 - 3.4   POC LYMPH PERCENT 18.0 10 - 50 %L   MID (cbc) 0.3 0 - 0.9   POC MID % 2.3 0 - 12 %M   POC Granulocyte 8.8 (A) 2 - 6.9   Granulocyte percent 79.7 37 - 80 %G   RBC 5.71 4.69 - 6.13 M/uL   Hemoglobin 17.9 14.1 - 18.1 g/dL   HCT, POC 50.8 43.5 - 53.7 %   MCV 89.0 80 - 97 fL   MCH, POC 31.3 (A) 27 - 31.2 pg   MCHC 35.2 31.8 - 35.4 g/dL   RDW, POC 13.0 %   Platelet Count, POC 286 142 - 424 K/uL   MPV 8.1 0 - 99.8 fL     Assessment and Plan: Cough - Plan: POCT CBC, azithromycin (ZITHROMAX) 250 MG tablet  Malaise - Plan: POCT CBC, azithromycin (ZITHROMAX) 250 MG tablet  Screening for hyperlipidemia - Plan: Lipid panel  Illness with worsening chest sx while on augmentin for sinusitis.  Stop agumentin and use azithromcyin instead.  If not helpful consider adding prednisone He will keep me updated as to his condition  Signed Lamar Blinks, MD

## 2014-12-27 ENCOUNTER — Other Ambulatory Visit: Payer: Self-pay | Admitting: Emergency Medicine

## 2015-01-18 ENCOUNTER — Ambulatory Visit (INDEPENDENT_AMBULATORY_CARE_PROVIDER_SITE_OTHER): Payer: Self-pay | Admitting: Emergency Medicine

## 2015-01-18 ENCOUNTER — Encounter: Payer: Self-pay | Admitting: Emergency Medicine

## 2015-01-18 VITALS — BP 126/82 | HR 74 | Temp 97.9°F | Resp 16 | Ht 75.0 in | Wt 208.8 lb

## 2015-01-18 DIAGNOSIS — A630 Anogenital (venereal) warts: Secondary | ICD-10-CM | POA: Insufficient documentation

## 2015-01-18 DIAGNOSIS — R5382 Chronic fatigue, unspecified: Secondary | ICD-10-CM

## 2015-01-18 LAB — TESTOSTERONE, FREE, TOTAL, SHBG
Sex Hormone Binding: 38 nmol/L (ref 10–50)
Testosterone, Free: 64.4 pg/mL (ref 47.0–244.0)
Testosterone-% Free: 1.8 % (ref 1.6–2.9)
Testosterone: 351 ng/dL (ref 300–890)

## 2015-01-18 NOTE — Progress Notes (Signed)
By signing my name below, I, Raven Small, attest that this documentation has been prepared under the direction and in the presence of [name], MD.  Electronically Signed: Thea Alken, ED Scribe. 01/18/2015. 9:44 AM.  Chief Complaint:  Chief Complaint  Patient presents with  . Follow-up  . wart in groin area    HPI: Stuart Hill is a 44 y.o. male who reports to [facility] today for a follow up.   Pt reports  lesions to groin area, described as a wart.   Pt would also like to have testosterone level checked. He reports symptoms of fatigue and trouble obtaining erections. Pt does landscaping for work. States he used to be able to go to the gym after work, but now is too tired and does not have enough energy to work out. He denies increase in physical work at work. He denies CP, SOB and abdominal pain.  Past Medical History  Diagnosis Date  . Allergy   . Arthritis   . Hypertension   . Anxiety   . Hyperlipidemia    Past Surgical History  Procedure Laterality Date  . Dental surgery    . Hand surgery     Social History   Social History  . Marital Status: Married    Spouse Name: N/A  . Number of Children: N/A  . Years of Education: N/A   Social History Main Topics  . Smoking status: Current Every Day Smoker -- 1.00 packs/day for 24 years    Types: Cigarettes  . Smokeless tobacco: Never Used  . Alcohol Use: 15.0 oz/week    25 Cans of beer per week  . Drug Use: None  . Sexual Activity: Not Asked   Other Topics Concern  . None   Social History Narrative   Family History  Problem Relation Age of Onset  . COPD Mother   . Fibromyalgia Mother   . Heart disease Father   . Depression Brother   . Heart disease Maternal Grandmother   . Stroke Maternal Grandmother   . Heart disease Maternal Grandfather   . Stroke Maternal Grandfather   . Heart disease Paternal Grandmother   . Stroke Paternal Grandmother   . Heart disease Paternal Grandfather   . Stroke Paternal  Grandfather    Allergies  Allergen Reactions  . Advil [Ibuprofen] Hives  . Afrin [Nasal Spray] Hives  . Antihistamines, Chlorpheniramine-Type   . Atrovent [Ipratropium] Hives  . Bc Powder [Aspirin-Salicylamide-Caffeine]   . Benadryl [Diphenhydramine Hcl] Hives  . Codeine Hives  . Gastromax [Metoclopramide]   . Morphine And Related Hives  . [name] [Carisoprodol] Hives  . Tylenol [Acetaminophen] Hives   Prior to Admission medications   Medication Sig Start Date End Date Taking? Authorizing Provider  ALPRAZolam (XANAX) 1 MG tablet TAKE 1 TABLET BY MOUTH 3 TIMES A DAY AS NEEDED FOR ANXIETY 12/27/14  Yes Darlyne Russian, MD     ROS: The patient denies fevers, chills, night sweats, unintentional weight loss, chest pain, palpitations, wheezing, dyspnea on exertion, nausea, vomiting, abdominal pain, dysuria, hematuria, melena, numbness, weakness, or tingling.   All other systems have been reviewed and were otherwise negative with the exception of those mentioned in the HPI and as above.    PHYSICAL EXAM: Filed Vitals:   01/18/15 0930  BP: 126/82  Pulse: 74  Temp: 97.9 F (36.6 C)  Resp: 16   Body mass index is 26.1 kg/(m^2).   General: Alert, no acute distress HEENT:  Normocephalic, atraumatic,  oropharynx patent. Eye: [facility] Cardiovascular:  Regular rate and rhythm, no rubs murmurs or gallops.  No Carotid bruits, radial pulse intact. No pedal edema.  Respiratory: Clear to auscultation bilaterally.  No wheezes, rales, or rhonchi.  No cyanosis, no use of accessory musculature Abdominal: No organomegaly, abdomen is soft and non-tender, positive bowel sounds.  No masses. Musculoskeletal: Gait intact. No edema, tenderness Skin: No rashes. 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  Neurologic: Facial musculature symmetric. Psychiatric: Patient acts appropriately throughout our interaction. 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   LABS:    EKG/XRAY:   Primary read interpreted by Dr. Everlene Farrier at [facility]   ASSESSMENT/PLAN: Patient has a history of condyloma toe. He has had these treated with liquid nitrogen in the past. His outbreak now is much more severe than previous. They were retreated today. We'll recheck in one month. He did not want referral to dermatology for financial reasons. He also did not want to try Condylox or Aldara for financial reasons. I did draw blood for a testosterone level at his request to evaluate fatigue.I personally performed the services described in this documentation, which was scribed in my presence. The recorded information has been reviewed and is accurate.   Gross sideeffects, risk and benefits, and alternatives of medications d/w patient. Patient is aware that all medications have potential sideeffects and we are unable to predict every sideeffect or drug-drug interaction that may occur.  [name] MD 01/18/2015 9:41 AM

## 2015-01-18 NOTE — Patient Instructions (Signed)
Genital Warts Genital warts are a common STD (sexually transmitted disease). They may appear as small bumps on the tissues of the genital area or anal area. Sometimes, they can become irritated and cause pain. Genital warts are easily passed to other people through sexual contact. Getting treatment is important because genital warts can lead to other problems. In females, the virus that causes genital warts may increase the risk of cervical cancer. CAUSES Genital warts are caused by a virus that is called human papillomavirus (HPV). HPV is spread by having unprotected sex with an infected person. It can be spread through vaginal, anal, and oral sex. Many people do not know that they are infected. They may be infected for years without problems. However, even if they do not have problems, they can pass the infection to their sexual partners. RISK FACTORS Genital warts are more likely to develop in:  People who have unprotected sex.  People who have multiple sexual partners.  People who become sexually active before they are 42 years of age.  Men who are not circumcised.  Women who have a male sexual partner who is not circumcised.  People who have a weakened body defense system (immune system) due to disease or medicine.  People who smoke. SYMPTOMS Symptoms of genital warts include:  Small growths in the genital area or anal area. These warts often grow in clusters.  Itching and irritation in the genital area or anal area.  Bleeding from the warts.  Painful sexual intercourse. DIAGNOSIS Genital warts can usually be diagnosed from their appearance on the vagina, vulva, penis, perineum, anus, or rectum. Tests may also be done, such as:  Biopsy. A tissue sample is removed so it can be looked at under a microscope.  Colposcopy. In females, a magnifying tool is used to examine the vagina and cervix. Certain solutions may be used to make the HPV cells change color so they can be seen more  easily.  A Pap test in females.  Tests for other STDs. TREATMENT Treatment for genital warts may include:  Applying prescription medicines to the warts. These may be solutions or creams.  Freezing the warts with liquid nitrogen (cryotherapy).  Burning the warts with:  Laser treatment.  An electrified probe (electrocautery).  Injecting a substance (Candida antigen or Trichophyton antigen) into the warts to help the body's immune system to fight off the warts.  Interferon injections.  Surgery to remove the warts. HOME CARE INSTRUCTIONS Medicines  Apply over-the-counter and prescription medicines only as told by your health care provider.  Do not treat genital warts with medicines that are used for treating hand warts.  Talk with your health care provider about using over-the-counter anti-itch creams. General Instructions  Do not touch or scratch the warts.  Do not have sex until your treatment has been completed.  Tell your current and past sexual partners about your condition because they may also need treatment.  Keep all follow-up visits as told by your health care provider. This is important.  After treatment, use condoms during sex to prevent future infections. Other Instructions for Women  Women who have genital warts might need increased screening for cervical cancer. This type of cancer is slow growing and can be cured if it is found early. Chances of developing cervical cancer are increased with HPV.  If you become pregnant, tell your health care provider that you have had HPV. Your health care provider will monitor you closely during pregnancy to be sure that your  baby is safe. PREVENTION Talk with your health care provider about getting the HPV vaccines. These vaccines prevent some HPV infections and cancers. It is recommended that the vaccine be given to males and females who are 9-26 years of age. It will not work if you already have HPV, and it is not  recommended for pregnant women. SEEK MEDICAL CARE IF:  You have redness, swelling, or pain in the area of the treated skin.  You have a fever.  You feel generally ill.  You feel lumps in and around your genital area or anal area.  You have bleeding in your genital area or anal area.  You have pain during sexual intercourse.   This information is not intended to replace advice given to you by your health care provider. Make sure you discuss any questions you have with your health care provider.   Document Released: 01/10/2000 Document Revised: 10/03/2014 Document Reviewed: 04/09/2014 Elsevier Interactive Patient Education 2016 Elsevier Inc.  

## 2015-02-06 ENCOUNTER — Other Ambulatory Visit: Payer: Self-pay | Admitting: Emergency Medicine

## 2015-02-19 ENCOUNTER — Ambulatory Visit (INDEPENDENT_AMBULATORY_CARE_PROVIDER_SITE_OTHER): Payer: Self-pay | Admitting: Emergency Medicine

## 2015-02-19 ENCOUNTER — Encounter: Payer: Self-pay | Admitting: Emergency Medicine

## 2015-02-19 VITALS — BP 133/85 | HR 97 | Temp 98.0°F | Resp 16 | Wt 209.0 lb

## 2015-02-19 DIAGNOSIS — A630 Anogenital (venereal) warts: Secondary | ICD-10-CM

## 2015-02-19 DIAGNOSIS — Z125 Encounter for screening for malignant neoplasm of prostate: Secondary | ICD-10-CM

## 2015-02-19 NOTE — Progress Notes (Signed)
Patient ID: Stuart Hill, male   DOB: 11-22-1970, 45 y.o.   MRN: [id number]     By signing my name below, I, Zola Button, attest that this documentation has been prepared under the direction and in the presence of [name], MD.  Electronically Signed: Zola Button, Medical Scribe. 02/19/2015. 11:50 AM.   Chief Complaint:  Chief Complaint  Patient presents with  . Fatigue    chronic fatigue  . Anxiety    HPI: Stuart Hill is a 45 y.o. male who reports to [facility] today for a follow-up for genital warts. I last saw him 1 month ago. At that time, he had condylomas on his penis which were treated with cryotherapy.  Past Medical History  Diagnosis Date  . Allergy   . Arthritis   . Hypertension   . Anxiety   . Hyperlipidemia    Past Surgical History  Procedure Laterality Date  . Dental surgery    . Hand surgery     Social History   Social History  . Marital Status: Married    Spouse Name: N/A  . Number of Children: N/A  . Years of Education: N/A   Social History Main Topics  . Smoking status: Current Every Day Smoker -- 1.00 packs/day for 24 years    Types: Cigarettes  . Smokeless tobacco: Never Used  . Alcohol Use: 15.0 oz/week    25 Cans of beer per week  . Drug Use: None  . Sexual Activity: Not Asked   Other Topics Concern  . None   Social History Narrative   Family History  Problem Relation Age of Onset  . COPD Mother   . Fibromyalgia Mother   . Heart disease Father   . Depression Brother   . Heart disease Maternal Grandmother   . Stroke Maternal Grandmother   . Heart disease Maternal Grandfather   . Stroke Maternal Grandfather   . Heart disease Paternal Grandmother   . Stroke Paternal Grandmother   . Heart disease Paternal Grandfather   . Stroke Paternal Grandfather    Allergies  Allergen Reactions  . Advil [Ibuprofen] Hives  . Afrin [Nasal Spray] Hives  . Antihistamines, Chlorpheniramine-Type   . Atrovent [Ipratropium] Hives  . Bc Powder  [Aspirin-Salicylamide-Caffeine]   . Benadryl [Diphenhydramine Hcl] Hives  . Codeine Hives  . Gastromax [Metoclopramide]   . Morphine And Related Hives  . [name] [Carisoprodol] Hives  . Tylenol [Acetaminophen] Hives   Prior to Admission medications   Medication Sig Start Date End Date Taking? Authorizing Provider  ALPRAZolam (XANAX) 1 MG tablet TAKE 1 TAB BY MOUTH 3 TIMES DAILY AS NEEDED FOR ANXIETY. 02/07/15  Yes Darlyne Russian, MD     ROS: The patient denies fevers, chills, night sweats, unintentional weight loss, chest pain, palpitations, wheezing, dyspnea on exertion, nausea, vomiting, abdominal pain, dysuria, hematuria, melena, numbness, weakness, or tingling.   All other systems have been reviewed and were otherwise negative with the exception of those mentioned in the HPI and as above.    PHYSICAL EXAM: Filed Vitals:   02/19/15 1129  BP: 133/85  Pulse: 97  Temp: 98 F (36.7 C)  Resp: 16   Body mass index is 26.12 kg/(m^2).   General: Alert, no acute distress HEENT:  Normocephalic, atraumatic, oropharynx patent. Eye: [facility] Cardiovascular:  Regular rate and rhythm, no rubs murmurs or gallops.  No Carotid bruits, radial pulse intact. No pedal edema.  Respiratory: Clear to auscultation bilaterally.  No wheezes, rales, or  rhonchi.  No cyanosis, no use of accessory musculature Abdominal: No organomegaly, abdomen is soft and non-tender, positive bowel sounds.  No masses. Musculoskeletal: Gait intact. No edema, tenderness Skin: No rashes. Neurologic: Facial musculature symmetric. Psychiatric: Patient acts appropriately throughout our interaction. Lymphatic: No cervical or submandibular lymphadenopathy Genitourinary: Prostate normal size, no nodules. 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 One lesion to the left side of penile shaft.   Procedure note: All warts were treated for 4-5 seconds with repeat x 1. Patient tolerated  well.   LABS:    EKG/XRAY:   Primary read interpreted by Dr. Everlene Farrier at [facility].   ASSESSMENT/PLAN: Patient prostate check today and this was normal. He has multiple condylomata which were treated with cryotherapy. Patient tolerated well. We'll recheck 1 month and retrieved as needed.I personally performed the services described in this documentation, which was scribed in my presence. The recorded information has been reviewed and is accurate.    Gross sideeffects, risk and benefits, and alternatives of medications d/w patient. Patient is aware that all medications have potential sideeffects and we are unable to predict every sideeffect or drug-drug interaction that may occur.  [name] MD 02/19/2015 11:50 AM

## 2015-03-08 ENCOUNTER — Other Ambulatory Visit: Payer: Self-pay | Admitting: Family Medicine

## 2015-03-28 ENCOUNTER — Ambulatory Visit: Payer: Self-pay | Admitting: Emergency Medicine

## 2015-04-03 ENCOUNTER — Telehealth: Payer: Self-pay

## 2015-04-03 ENCOUNTER — Ambulatory Visit (INDEPENDENT_AMBULATORY_CARE_PROVIDER_SITE_OTHER): Payer: Self-pay

## 2015-04-03 ENCOUNTER — Ambulatory Visit (INDEPENDENT_AMBULATORY_CARE_PROVIDER_SITE_OTHER): Payer: Self-pay | Admitting: Emergency Medicine

## 2015-04-03 VITALS — BP 152/100 | HR 87 | Temp 97.9°F | Resp 14 | Ht 75.0 in | Wt 214.0 lb

## 2015-04-03 DIAGNOSIS — R070 Pain in throat: Secondary | ICD-10-CM

## 2015-04-03 DIAGNOSIS — R042 Hemoptysis: Secondary | ICD-10-CM

## 2015-04-03 DIAGNOSIS — D751 Secondary polycythemia: Secondary | ICD-10-CM

## 2015-04-03 LAB — POCT CBC
Granulocyte percent: 70.7 %G (ref 37–80)
HCT, POC: 52.5 % (ref 43.5–53.7)
HEMOGLOBIN: 18.7 g/dL — AB (ref 14.1–18.1)
LYMPH, POC: 2 (ref 0.6–3.4)
MCH, POC: 31.9 pg — AB (ref 27–31.2)
MCHC: 35.5 g/dL — AB (ref 31.8–35.4)
MCV: 89.9 fL (ref 80–97)
MID (cbc): 0.7 (ref 0–0.9)
MPV: 8.3 fL (ref 0–99.8)
PLATELET COUNT, POC: 214 10*3/uL (ref 142–424)
POC Granulocyte: 6.6 (ref 2–6.9)
POC LYMPH PERCENT: 22 %L (ref 10–50)
POC MID %: 7.3 %M (ref 0–12)
RBC: 5.85 M/uL (ref 4.69–6.13)
RDW, POC: 14.3 %
WBC: 9.3 10*3/uL (ref 4.6–10.2)

## 2015-04-03 LAB — POCT RAPID STREP A (OFFICE): Rapid Strep A Screen: NEGATIVE

## 2015-04-03 NOTE — Telephone Encounter (Signed)
Called patient to inform him that he has an appt tomorrow with [facility] ENT with Dr. Constance Holster at [facility]. He needs to be there at 830am. Address is [address], [place] [address]. Their number is [phone].

## 2015-04-03 NOTE — Progress Notes (Addendum)
By signing my name below, I, Stuart Hill, attest that this documentation has been prepared under the direction and in the presence of Stuart Queen, MD. Electronically Signed: Moises Hill, [place]. 04/03/2015 , 9:39 AM .  Patient was seen in room 3 .  Chief Complaint:  Chief Complaint  Patient presents with  . other    pt felt like something is blocking his throat and he spit up Hill in his mucus. x 1 day    HPI: Stuart Hill is a 45 y.o. male who reports to [facility] today complaining of feeling something is blocking his throat. He reports that he spat up bright red Hill in his mucus. Last night, he was trying to cough up phlegm but had a pull feeling where he wasn't able to cough it up. He was coughing up the phlegm this morning and was finally able to. He spat out bright red spots in his mucus. He is concerned because his mother had history of thyroid cancer.   His wife reports that the patient was working with a saw yesterday and had a lot of sawdust around.  He is still smoking a little over a pack a day.   Past Medical History  Diagnosis Date  . Allergy   . Arthritis   . Hypertension   . Anxiety   . Hyperlipidemia    Past Surgical History  Procedure Laterality Date  . Dental surgery    . Hand surgery     Social History   Social History  . Marital Status: Married    Spouse Name: N/A  . Number of Children: N/A  . Years of Education: N/A   Social History Main Topics  . Smoking status: Current Every Day Smoker -- 1.00 packs/day for 24 years    Types: Cigarettes  . Smokeless tobacco: Never Used  . Alcohol Use: 15.0 oz/week    25 Cans of beer per week  . Drug Use: None  . Sexual Activity: Not Asked   Other Topics Concern  . None   Social History Narrative   Family History  Problem Relation Age of Onset  . COPD Mother   . Fibromyalgia Mother   . Heart disease Father   . Depression Brother   . Heart disease Maternal Grandmother   . Stroke Maternal  Grandmother   . Heart disease Maternal Grandfather   . Stroke Maternal Grandfather   . Heart disease Paternal Grandmother   . Stroke Paternal Grandmother   . Heart disease Paternal Grandfather   . Stroke Paternal Grandfather    Allergies  Allergen Reactions  . Advil [Ibuprofen] Hives  . Afrin [Nasal Spray] Hives  . Antihistamines, Chlorpheniramine-Type   . Atrovent [Ipratropium] Hives  . Bc Powder [Aspirin-Salicylamide-Caffeine]   . Benadryl [Diphenhydramine Hcl] Hives  . Codeine Hives  . Gastromax [Metoclopramide]   . Morphine And Related Hives  . [name] [Carisoprodol] Hives  . Tylenol [Acetaminophen] Hives   Prior to Admission medications   Medication Sig Start Date End Date Taking? Authorizing Provider  ALPRAZolam (XANAX) 1 MG tablet TAKE 1 TAB BY MOUTH 3 TIMES DAILY AS NEEDED FOR ANXIETY. 03/09/15  Yes Gay Filler Copland, MD     ROS:  Constitutional: negative for fever, chills, night sweats, weight changes, or fatigue  HEENT: negative for vision changes, hearing loss, congestion, rhinorrhea, ST, epistaxis, or sinus pressure Cardiovascular: negative for chest pain or palpitations Respiratory: negative for wheezing, shortness of breath, cough; positive for hemoptysis Abdominal: negative for abdominal  pain, nausea, vomiting, diarrhea, or constipation Dermatological: negative for rash Neurologic: negative for headache, dizziness, or syncope All other systems reviewed and are otherwise negative with the exception to those above and in the HPI.  PHYSICAL EXAM: Filed Vitals:   04/03/15 0916  BP: 152/100  Pulse: 87  Temp: 97.9 F (36.6 C)  Resp: 14   Body mass index is 26.75 kg/(m^2).   General: Alert, no acute distress HEENT:  Normocephalic, atraumatic, oropharynx patent; Tonsils 2+ enlarged, appears to be small varicosity over tonsillar pillar, mild tenderness over trachea Eye: [facility] Cardiovascular:  Regular rate and rhythm, no rubs murmurs or gallops.  No  Carotid bruits, radial pulse intact. No pedal edema.  Respiratory: Clear to auscultation bilaterally.  No wheezes, rales, or rhonchi.  No cyanosis, no use of accessory musculature Abdominal: No organomegaly, abdomen is soft and non-tender, positive bowel sounds. No masses. Musculoskeletal: Gait intact. No edema, tenderness Skin: No rashes. Neurologic: Facial musculature symmetric. Psychiatric: Patient acts appropriately throughout our interaction.  Lymphatic: No cervical or submandibular lymphadenopathy Genitourinary/Anorectal: No acute findings  LABS: Results for orders placed or performed in visit on 04/03/15  POCT rapid strep A  Result Value Ref Range   Rapid Strep A Screen Negative Negative  POCT CBC  Result Value Ref Range   WBC 9.3 4.6 - 10.2 K/uL   Lymph, poc 2.0 0.6 - 3.4   POC LYMPH PERCENT 22.0 10 - 50 %L   MID (cbc) 0.7 0 - 0.9   POC MID % 7.3 0 - 12 %M   POC Granulocyte 6.6 2 - 6.9   Granulocyte percent 70.7 37 - 80 %G   RBC 5.85 4.69 - 6.13 M/uL   Hemoglobin 18.7 (A) 14.1 - 18.1 g/dL   HCT, POC 52.5 43.5 - 53.7 %   MCV 89.9 80 - 97 fL   MCH, POC 31.9 (A) 27 - 31.2 pg   MCHC 35.5 (A) 31.8 - 35.4 g/dL   RDW, POC 14.3 %   Platelet Count, POC 214 142 - 424 K/uL   MPV 8.3 0 - 99.8 fL    EKG/XRAY:   Dg Cervical Spine 2 Or 3 Views  04/03/2015  CLINICAL DATA:  Hemoptysis, feels like something blocking his throat. EXAM: CERVICAL SPINE - 2-3 VIEW COMPARISON:  None. FINDINGS: There is no evidence of cervical spine fracture or prevertebral soft tissue swelling. Alignment is normal. No other significant bone abnormalities are identified. IMPRESSION: Negative cervical spine radiographs. Electronically Signed   By: Rolm Baptise M.D.   On: 04/03/2015 10:41      ASSESSMENT/PLAN: Patient had 2 episodes of bright red hemoptysis. He has a sensation of something in his throat. Soft tissue views are negative chest x-ray revealed no definite abnormalities urgent referral made to ENT  for visualization of the vocal cords and base of the tongue.I personally performed the services described in this documentation, which was scribed in my presence. The recorded information has been reviewed and is accurate.   Gross sideeffects, risk and benefits, and alternatives of medications d/w patient. Patient is aware that all medications have potential sideeffects and we are unable to predict every sideeffect or drug-drug interaction that may occur.  Stuart Queen MD 04/03/2015 10:12 AM

## 2015-04-03 NOTE — Patient Instructions (Signed)
Hemoptysis  Hemoptysis, which means coughing up blood, can be a sign of a minor problem or a serious medical condition. The blood that is coughed up may come from the lungs and airways. Coughed-up blood can also come from bleeding that occurs outside the lungs and airways. Blood can drain into the windpipe during a severe nosebleed or when blood is vomited from the stomach. Because hemoptysis can be a sign of something serious, a medical evaluation is required. For some people with hemoptysis, no definite cause is ever identified.  CAUSES   The most common cause of hemoptysis is bronchitis. Some other common causes include:   · A ruptured blood vessel caused by coughing or an infection.    · A medical condition that causes damage to the large air passageways (bronchiectasis).    · A blood clot in the lungs (pulmonary embolism).    · Pneumonia.    · Tuberculosis.    · Breathing in a small foreign object.    · Cancer.  For some people with hemoptysis, no definite cause is ever identified.    HOME CARE INSTRUCTIONS  · Only take over-the-counter or prescription medicines as directed by your caregiver. Do not use cough suppressants unless your caregiver approves.  · If your caregiver prescribes antibiotic medicines, take them as directed. Finish them even if you start to feel better.  · Do not smoke. Also avoid secondhand smoke.  · Follow up with your caregiver as directed.  SEEK IMMEDIATE MEDICAL CARE IF:   · You cough up bloody mucus for longer than a week.  · You have a blood-producing cough that is severe or getting worse.  · You have a blood-producing cough that comes and goes over time.  · You develop problems with your breathing.    · You vomit blood.  · You develop bloody or black-colored stools.  · You have chest pain.    · You develop night sweats.  · You feel faint or pass out.    · You have a fever or persistent symptoms for more than 2-3 days.    · You have a fever and your symptoms suddenly get worse.  MAKE  SURE YOU:  · Understand these instructions.  · Will watch your condition.  · Will get help right away if you are not doing well or get worse.     This information is not intended to replace advice given to you by your health care provider. Make sure you discuss any questions you have with your health care provider.     Document Released: 03/23/2001 Document Revised: 12/30/2011 Document Reviewed: 10/30/2011  Elsevier Interactive Patient Education ©2016 Elsevier Inc.

## 2015-04-04 DIAGNOSIS — F17210 Nicotine dependence, cigarettes, uncomplicated: Secondary | ICD-10-CM | POA: Insufficient documentation

## 2015-04-04 DIAGNOSIS — Z789 Other specified health status: Secondary | ICD-10-CM | POA: Insufficient documentation

## 2015-04-04 DIAGNOSIS — K219 Gastro-esophageal reflux disease without esophagitis: Secondary | ICD-10-CM | POA: Insufficient documentation

## 2015-04-05 ENCOUNTER — Other Ambulatory Visit: Payer: Self-pay

## 2015-04-05 ENCOUNTER — Ambulatory Visit: Payer: Self-pay

## 2015-04-05 ENCOUNTER — Ambulatory Visit: Payer: Self-pay | Admitting: Hematology & Oncology

## 2015-04-08 LAB — JAK2 MUT. V617F QUANT, LEUMETA: JAK2 MUT,QN,LEUMETA: NEGATIVE pg/uL

## 2015-04-10 ENCOUNTER — Encounter: Payer: Self-pay | Admitting: Hematology & Oncology

## 2015-04-10 ENCOUNTER — Ambulatory Visit: Payer: Self-pay

## 2015-04-10 ENCOUNTER — Other Ambulatory Visit (HOSPITAL_BASED_OUTPATIENT_CLINIC_OR_DEPARTMENT_OTHER): Payer: Self-pay

## 2015-04-10 ENCOUNTER — Other Ambulatory Visit: Payer: Self-pay | Admitting: *Deleted

## 2015-04-10 ENCOUNTER — Ambulatory Visit (HOSPITAL_BASED_OUTPATIENT_CLINIC_OR_DEPARTMENT_OTHER): Payer: Self-pay | Admitting: Hematology & Oncology

## 2015-04-10 VITALS — BP 125/80 | HR 76 | Temp 98.0°F | Resp 16 | Ht 75.0 in | Wt 214.0 lb

## 2015-04-10 DIAGNOSIS — R042 Hemoptysis: Secondary | ICD-10-CM

## 2015-04-10 DIAGNOSIS — D751 Secondary polycythemia: Secondary | ICD-10-CM

## 2015-04-10 DIAGNOSIS — D45 Polycythemia vera: Secondary | ICD-10-CM

## 2015-04-10 DIAGNOSIS — Z720 Tobacco use: Secondary | ICD-10-CM

## 2015-04-10 HISTORY — DX: Secondary polycythemia: D75.1

## 2015-04-10 LAB — CBC WITH DIFFERENTIAL (CANCER CENTER ONLY)
BASO#: 0.1 10*3/uL (ref 0.0–0.2)
BASO%: 0.3 % (ref 0.0–2.0)
EOS%: 0.5 % (ref 0.0–7.0)
Eosinophils Absolute: 0.1 10*3/uL (ref 0.0–0.5)
HEMATOCRIT: 52 % — AB (ref 38.7–49.9)
HGB: 18.6 g/dL — ABNORMAL HIGH (ref 13.0–17.1)
LYMPH#: 2 10*3/uL (ref 0.9–3.3)
LYMPH%: 13.3 % — ABNORMAL LOW (ref 14.0–48.0)
MCH: 31.8 pg (ref 28.0–33.4)
MCHC: 35.8 g/dL (ref 32.0–35.9)
MCV: 89 fL (ref 82–98)
MONO#: 1 10*3/uL — ABNORMAL HIGH (ref 0.1–0.9)
MONO%: 6.3 % (ref 0.0–13.0)
NEUT%: 79.6 % (ref 40.0–80.0)
NEUTROS ABS: 12.1 10*3/uL — AB (ref 1.5–6.5)
Platelets: 207 10*3/uL (ref 145–400)
RBC: 5.85 10*6/uL — ABNORMAL HIGH (ref 4.20–5.70)
RDW: 13.7 % (ref 11.1–15.7)
WBC: 15.2 10*3/uL — ABNORMAL HIGH (ref 4.0–10.0)

## 2015-04-10 LAB — COMPREHENSIVE METABOLIC PANEL
ALBUMIN: 4.2 g/dL (ref 3.5–5.0)
ALK PHOS: 61 U/L (ref 40–150)
ALT: 54 U/L (ref 0–55)
AST: 39 U/L — AB (ref 5–34)
Anion Gap: 8 mEq/L (ref 3–11)
BUN: 15.3 mg/dL (ref 7.0–26.0)
CHLORIDE: 104 meq/L (ref 98–109)
CO2: 25 mEq/L (ref 22–29)
CREATININE: 0.9 mg/dL (ref 0.7–1.3)
Calcium: 9.3 mg/dL (ref 8.4–10.4)
EGFR: >90 mL/min/{1.73_m2} (ref >90)
GLUCOSE: 93 mg/dL (ref 70–140)
POTASSIUM: 4.1 meq/L (ref 3.5–5.1)
SODIUM: 137 meq/L (ref 136–145)
Total Bilirubin: 0.71 mg/dL (ref 0.20–1.20)
Total Protein: 8 g/dL (ref 6.4–8.3)

## 2015-04-10 LAB — CHCC SATELLITE - SMEAR

## 2015-04-10 LAB — LACTATE DEHYDROGENASE: LDH: 194 U/L (ref 125–245)

## 2015-04-10 NOTE — Progress Notes (Signed)
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 Follow-up  : I'm here to see if I have too much blood.  HPI: Mr. Gramza is a 45 year old white male. He does Biomedical scientist. I actually have known him from the gym that I workout in.  He has been seen by Dr. Everlene Farrier. He has been't having some issues with his blood.   Going through his labs, for the past 3 or 4 years, his hemoglobin has been slightly elevated.  Mr. Rosberg does have some anxiety. He is on Xanax. He apparently has a lot of allergies from what he says.  Lab work that was done recently, shows a white cell count 9.3. Hemoglobin 18.7. Hematocrit 52.5. And platelet count was not done.his MCV was 90.  Going back to June 2013, his white cell count was 10.8. Hemoglobin 16.5. Hematocrit 50.6. And MCV was 93.  He had an abdominal ultrasound done back about 4 years ago. This was relatively unremarkable.  Dr. Everlene Farrier is a great job and checked a JAK2 mutation. This was negative.  Mr. Shahan has had no problems with nausea or vomiting. He's had no change in bowel or bladder habits. His had no cough. He was smoking quite a bit. He was smoking a pack and half for many years. He is trying to cut back on this. He was drinking 6 cups of coffee daily. He's cut back on this. He is cut back on alcohol use.  He really wants to try to change his lifestyle so that he does not have problems with respect to 2 much blood.  He has a very interesting family history. There's been a lot of deaths in his family early from heart disease. He had a mentally challenged brother who passed away at age 73 from heart disease.  He has had no problems with weight loss or weight gain. He's trying to exercise more and lose a little bit more weight.  He's had no rashes. He's had no pain in his hands or feet. He denies any pruritus when he showers.  Overall, his performance  status is ECOG 0.            Past Medical History  Diagnosis Date  . Allergy   . Arthritis   . Hypertension   . Anxiety   . Hyperlipidemia   :  Past Surgical History  Procedure Laterality Date  . Dental surgery    . Hand surgery    :   Current outpatient prescriptions:  .  ALPRAZolam (XANAX) 1 MG tablet, TAKE 1 TAB BY MOUTH 3 TIMES DAILY AS NEEDED FOR ANXIETY., Disp: 90 tablet, Rfl: 0:  :  Allergies  Allergen Reactions  . Advil [Ibuprofen] Hives  . Afrin [Nasal Spray] Hives  . Antihistamines, Chlorpheniramine-Type   . Atrovent [Ipratropium] Hives  . Bc Powder [Aspirin-Salicylamide-Caffeine]   . Benadryl [Diphenhydramine Hcl] Hives  . Codeine Hives  . Gastromax [Metoclopramide]   . Morphine And Related Hives  . [name] [Carisoprodol] Hives  . Tylenol [Acetaminophen] Hives  :  Family History  Problem Relation Age of Onset  . COPD Mother   . Fibromyalgia Mother   . Heart disease Father   . Depression Brother   . Heart disease Maternal Grandmother   . Stroke Maternal Grandmother   . Heart disease Maternal Grandfather   . Stroke Maternal Grandfather   . Heart disease Paternal Grandmother   . Stroke Paternal  Grandmother   . Heart disease Paternal Grandfather   . Stroke Paternal Grandfather   :  Social History   Social History  . Marital Status: Married    Spouse Name: N/A  . Number of Children: N/A  . Years of Education: N/A   Occupational History  . Not on file.   Social History Main Topics  . Smoking status: Current Every Day Smoker -- 1.00 packs/day for 24 years    Types: Cigarettes  . Smokeless tobacco: Never Used  . Alcohol Use: 15.0 oz/week    25 Cans of beer per week  . Drug Use: Not on file  . Sexual Activity: Not on file   Other Topics Concern  . Not on file   Social History Narrative  :  Pertinent items are noted in HPI.  Exam: @IPVITALS @ Well-developed and well-nourished white male in no obvious distress. Vital signs are  temperature of 98. Pulse 76. Blood pressure 125/80. Weight is 214 pounds. Head and neck exam shows no ocular or oral lesions.there is no scleral icterus. He has no conjunctival inflammation. There is no facial plethora. He has no palpable cervical or supraclavicular lymph nodes. Lungs are clear bilaterally.cardiac exam regular rate and rhythm with no murmurs, rubs or bruits. Abdomen is soft. he has good bowel sounds. There is no fluid wave. There is no palpable liver or spleen tip. Back exam shows no tenderness over the spine, ribs or hips. Extremities shows no clubbing, cyanosis or edema. Neurological exam shows no focal neurological deficits. Skin exam shows no Ruddy complexion. He has no ecchymoses or petechia.   Recent Labs  04/10/15 1301  WBC 15.2*  HGB 18.6*  HCT 52.0*  PLT 207   No results for input(s): NA, K, CL, CO2, GLUCOSE, BUN, CREATININE, CALCIUM in the last 72 hours.  Blood smear review: normochromic and normocytic population of red blood cells. He has no nucleated red blood cells. He has no teardrop cells. He has no schistocytes. There are no target cells. White cells are normal in morphology maturation. He has no immature myeloid or lymphoid forms. There is no hypersegmented polys. Platelets are adequate in number and size.  Pathology: none    Assessment and Plan:  Mr. Nethery is a 45 year old white male. He has erythrocytosis. I have to believe that this is some type of physiologic issue. I guess with him smoking, it might be that he has some increased carbon monoxide that is triggering his body to produce red cells.  We will have to see what his erythropoietin level is. We will have to see what his iron stores are.  The fact that he is JAK2 negative is somewhat reassuring.  He is trying to make changes in his lifestyle. I just hink this is definitely very helpful.  We will see what the rest of his labs show.  We might have to consider a phlebotomy program for him  temporarily.  I spent about 45 minutes with him today. I answered all his questions.

## 2015-04-11 LAB — IRON AND TIBC
%SAT: 26 % (ref 20–55)
Iron: 97 ug/dL (ref 42–163)
TIBC: 371 ug/dL (ref 202–409)
UIBC: 274 ug/dL (ref 117–376)

## 2015-04-11 LAB — ERYTHROPOIETIN: ERYTHROPOIETIN: 1.9 m[IU]/mL — AB (ref 2.6–18.5)

## 2015-04-11 LAB — FERRITIN: Ferritin: 115 ng/ml (ref 22–316)

## 2015-04-11 LAB — RETICULOCYTES: Reticulocyte Count: 1.1 % (ref 0.6–2.6)

## 2015-04-15 ENCOUNTER — Ambulatory Visit (INDEPENDENT_AMBULATORY_CARE_PROVIDER_SITE_OTHER): Payer: Self-pay | Admitting: Emergency Medicine

## 2015-04-15 VITALS — BP 116/80 | HR 81 | Temp 98.2°F | Resp 18 | Ht 75.0 in | Wt 214.2 lb

## 2015-04-15 DIAGNOSIS — R9389 Abnormal findings on diagnostic imaging of other specified body structures: Secondary | ICD-10-CM

## 2015-04-15 DIAGNOSIS — R938 Abnormal findings on diagnostic imaging of other specified body structures: Secondary | ICD-10-CM

## 2015-04-15 NOTE — Patient Instructions (Signed)
     IF you received an x-ray today, you will receive an invoice from [facility]. Please contact [facility] at [phone] with questions or concerns regarding your invoice.   IF you received labwork today, you will receive an invoice from [facility]. Please contact [facility] at [phone] with questions or concerns regarding your invoice.   Our billing staff will not be able to assist you with questions regarding bills from these companies.  You will be contacted with the lab results as soon as they are available. The fastest way to get your results is to activate your My Chart account. Instructions are located on the last page of this paperwork. If you have not heard from us regarding the results in 2 weeks, please contact this office.      

## 2015-04-15 NOTE — Progress Notes (Signed)
Patient ID: Stuart Hill, male   DOB: 22-Sep-1970, 45 y.o.   MRN: [id number]    By signing my name below, I, Essence Howell, attest that this documentation has been prepared under the direction and in the presence of Darlyne Russian, MD Electronically Signed: Ladene Artist, ED Scribe 04/15/2015 at 2:32 PM.  Chief Complaint:  Chief Complaint  Patient presents with  . Follow-up    for test results   HPI: Stuart Hill is a 45 y.o. male, with a h/o erythrocytosis, who reports to [facility] today for a follow-up. Pt was seen by oncologist Burney Gauze, MD 5 days ago and was diagnosed with erythrocytosis. Pt states that his hgb was elevated more since he was seen in the office on 04/03/15. He was advised to start a daily aspirin but stopped due to hives. Pt plans to order garlic pills instead. He denies recent hemoptysis. He states that he has cut back from 20-30 cigarettes daily to 10 cigarettes daily. Pt has also started exercising more often; increased from 2-3 days/week to 5-6 days/week.   Past Medical History  Diagnosis Date  . Allergy   . Arthritis   . Hypertension   . Anxiety   . Hyperlipidemia   . Erythrocytosis 04/10/2015   Past Surgical History  Procedure Laterality Date  . Dental surgery    . Hand surgery     Social History   Social History  . Marital Status: Married    Spouse Name: N/A  . Number of Children: N/A  . Years of Education: N/A   Social History Main Topics  . Smoking status: Current Every Day Smoker -- 1.00 packs/day for 24 years    Types: Cigarettes  . Smokeless tobacco: Never Used  . Alcohol Use: 15.0 oz/week    25 Cans of beer per week  . Drug Use: None  . Sexual Activity: Not Asked   Other Topics Concern  . None   Social History Narrative   Family History  Problem Relation Age of Onset  . COPD Mother   . Fibromyalgia Mother   . Heart disease Father   . Depression Brother   . Heart disease Maternal Grandmother   . Stroke Maternal Grandmother     . Heart disease Maternal Grandfather   . Stroke Maternal Grandfather   . Heart disease Paternal Grandmother   . Stroke Paternal Grandmother   . Heart disease Paternal Grandfather   . Stroke Paternal Grandfather    Allergies  Allergen Reactions  . Advil [Ibuprofen] Hives  . Afrin [Nasal Spray] Hives  . Antihistamines, Chlorpheniramine-Type   . Atrovent [Ipratropium] Hives  . Bc Powder [Aspirin-Salicylamide-Caffeine]   . Benadryl [Diphenhydramine Hcl] Hives  . Codeine Hives  . Gastromax [Metoclopramide]   . Morphine And Related Hives  . [name] [Carisoprodol] Hives  . Tylenol [Acetaminophen] Hives   Prior to Admission medications   Medication Sig Start Date End Date Taking? Authorizing Provider  ALPRAZolam (XANAX) 1 MG tablet TAKE 1 TAB BY MOUTH 3 TIMES DAILY AS NEEDED FOR ANXIETY. 03/09/15  Yes Gay Filler Copland, MD   ROS: The patient denies fevers, chills, night sweats, unintentional weight loss, -hemoptysis, chest pain, palpitations, wheezing, dyspnea on exertion, nausea, vomiting, abdominal pain, dysuria, hematuria, melena, numbness, weakness, or tingling.   All other systems have been reviewed and were otherwise negative with the exception of those mentioned in the HPI and as above.    PHYSICAL EXAM: Filed Vitals:   04/15/15 1341  BP: 116/80  Pulse: 81  Temp: 98.2 F (36.8 C)  Resp: 18   Body mass index is 26.77 kg/(m^2).  General: Alert, no acute distress HEENT:  Normocephalic, atraumatic, oropharynx patent. Eye: Juliette Mangle [facility] Cardiovascular: Regular rate and rhythm, no rubs murmurs or gallops. No Carotid bruits, radial pulse intact. No pedal edema.  Respiratory: Clear to auscultation bilaterally. No wheezes, rales, or rhonchi. No cyanosis, no use of accessory musculature Abdominal: No organomegaly, abdomen is soft and non-tender, positive bowel sounds. No masses. Musculoskeletal: Gait intact. No edema, tenderness Skin: No rashes. Neurologic: Facial musculature  symmetric. Psychiatric: Patient acts appropriately throughout our interaction. Lymphatic: No cervical or submandibular lymphadenopathy  LABS:  EKG/XRAY:   Primary read interpreted by Dr. Everlene Farrier at [facility].  ASSESSMENT/PLAN: Pt currently seeing Dr. Martha Clan for possible blood dyscrasia. Jak2 test was normal. Erythropoietin level was low. He is going to have his home checked for carbon monoxide position. CXR had questionable L basilar infiltrate. CT of chest has been ordered. He may need regular phlebotomies. He was allergic to Aspirin. He will discuss with Dr. Martha Clan other possible blood thinners.     Gross sideeffects, risk and benefits, and alternatives of medications d/w patient. Patient is aware that all medications have potential sideeffects and we are unable to predict every sideeffect or drug-drug interaction that may occur.  [name] MD 04/15/2015 2:26 PM

## 2015-04-19 ENCOUNTER — Telehealth: Payer: Self-pay | Admitting: Emergency Medicine

## 2015-04-19 ENCOUNTER — Other Ambulatory Visit: Payer: Self-pay | Admitting: Emergency Medicine

## 2015-04-19 MED ORDER — ALPRAZOLAM 1 MG PO TABS: ORAL_TABLET | ORAL | Status: DC

## 2015-04-19 MED ORDER — AZITHROMYCIN 250 MG PO TABS: ORAL_TABLET | ORAL | Status: DC

## 2015-04-19 NOTE — Telephone Encounter (Signed)
Patient stated he is still running a fever and he requst for a Z-Pack to be called into [facility] on [address]. Patient also request for a refill on Xanax 1 MG. Patient deleted the message for his CAT scan. Patient is requesting for the number for the CAT Scan results. [phone].

## 2015-04-19 NOTE — Telephone Encounter (Signed)
Patient has fever and a productive cough. Z-Pak called to the pharmacy.

## 2015-04-21 NOTE — Telephone Encounter (Signed)
Faxed in prescription.

## 2015-04-24 ENCOUNTER — Ambulatory Visit
Admission: RE | Admit: 2015-04-24 | Discharge: 2015-04-24 | Disposition: A | Discharge status: Home / Self Care | Payer: No Typology Code available for payment source | Source: Ambulatory Visit | Attending: Emergency Medicine | Admitting: Emergency Medicine

## 2015-04-24 DIAGNOSIS — R9389 Abnormal findings on diagnostic imaging of other specified body structures: Secondary | ICD-10-CM

## 2015-04-24 NOTE — Telephone Encounter (Signed)
Pt called and wanted to get another scan while he was there getting a CT scan for the chest. He states he had some pain in his stomach and wanted a CT of the abdomen. I called Dr. Everlene Farrier and discussed with him. We came to the conclusion that we would not be able to put the order in. He needs to come in to be seen to evaluate abdomen. I spoke with wife and advised her to have him come in.

## 2015-04-27 ENCOUNTER — Ambulatory Visit (HOSPITAL_BASED_OUTPATIENT_CLINIC_OR_DEPARTMENT_OTHER)
Admission: RE | Admit: 2015-04-27 | Discharge: 2015-04-27 | Disposition: A | Discharge status: Home / Self Care | Payer: Self-pay | Source: Ambulatory Visit | Attending: Family Medicine | Admitting: Family Medicine

## 2015-04-27 ENCOUNTER — Ambulatory Visit (INDEPENDENT_AMBULATORY_CARE_PROVIDER_SITE_OTHER): Payer: Self-pay | Admitting: Family Medicine

## 2015-04-27 VITALS — BP 129/83 | HR 74 | Temp 98.1°F | Resp 24 | Ht 75.0 in | Wt 208.0 lb

## 2015-04-27 DIAGNOSIS — I700 Atherosclerosis of aorta: Secondary | ICD-10-CM | POA: Insufficient documentation

## 2015-04-27 DIAGNOSIS — R109 Unspecified abdominal pain: Secondary | ICD-10-CM | POA: Insufficient documentation

## 2015-04-27 DIAGNOSIS — K4090 Unilateral inguinal hernia, without obstruction or gangrene, not specified as recurrent: Secondary | ICD-10-CM | POA: Insufficient documentation

## 2015-04-27 DIAGNOSIS — K429 Umbilical hernia without obstruction or gangrene: Secondary | ICD-10-CM | POA: Insufficient documentation

## 2015-04-27 NOTE — Patient Instructions (Addendum)
YOU ARE TO GO OVER TO [facility] FOR YOUR SCAN NOW.  [address].  PHONE [phone].  GO INTO THE ER AND REGISTER AS OUTPATIENT FOR YOUR SCAN.

## 2015-04-27 NOTE — Progress Notes (Signed)
45 yo man in [place] that has had severe cough for 2 weeks.  He has a 5 mm nodule on CT which has him worried.  He has had a week of pain in the right abdomen with some swelling.  He has pain with movement, cough, or lifting.  Objective:  BP 129/83 mmHg  Pulse 74  Temp(Src) 98.1 F (36.7 C) (Oral)  Resp 24  Ht 6\' 3"  (1.905 m)  Wt 208 lb (94.348 kg)  BMI 26.00 kg/m2  SpO2 98% Patient has some fullness right lower quadrant.  He has a small superior umbilical hernia.  No inguinal hernia.  Assessment:  Possible abdominal wall strain vs hernia  Plan:  After discussing the situation with the radiologist, we agreed that an abdominal wall CAT scan would be the most effective way of evaluating patient's symptoms. Patient agrees to get a CT without contrast in the next couple days.  Robyn Haber, MD

## 2015-04-28 ENCOUNTER — Telehealth: Payer: Self-pay | Admitting: *Deleted

## 2015-04-28 NOTE — Telephone Encounter (Signed)
Per Dr. Joseph Art CT scan showed no tear but showed 2 small hernias, not in place of where pain is.  Need to give it time, 10-14 days.  Can use Advil for pain.  Tried calling pt but had wrong number.

## 2015-04-30 NOTE — Telephone Encounter (Signed)
Spoke with pt, advised message. 

## 2015-05-02 ENCOUNTER — Other Ambulatory Visit: Payer: Self-pay | Admitting: *Deleted

## 2015-05-02 DIAGNOSIS — D45 Polycythemia vera: Secondary | ICD-10-CM

## 2015-05-03 ENCOUNTER — Other Ambulatory Visit (HOSPITAL_BASED_OUTPATIENT_CLINIC_OR_DEPARTMENT_OTHER): Payer: Self-pay

## 2015-05-03 ENCOUNTER — Encounter: Payer: Self-pay | Admitting: Hematology & Oncology

## 2015-05-03 ENCOUNTER — Ambulatory Visit (HOSPITAL_BASED_OUTPATIENT_CLINIC_OR_DEPARTMENT_OTHER): Payer: Self-pay | Admitting: Hematology & Oncology

## 2015-05-03 ENCOUNTER — Ambulatory Visit (HOSPITAL_BASED_OUTPATIENT_CLINIC_OR_DEPARTMENT_OTHER): Payer: Self-pay

## 2015-05-03 VITALS — BP 141/88 | HR 68 | Temp 98.0°F | Resp 18 | Ht 75.0 in | Wt 210.0 lb

## 2015-05-03 VITALS — BP 126/68 | HR 68 | Resp 18

## 2015-05-03 DIAGNOSIS — F419 Anxiety disorder, unspecified: Secondary | ICD-10-CM

## 2015-05-03 DIAGNOSIS — D45 Polycythemia vera: Secondary | ICD-10-CM

## 2015-05-03 DIAGNOSIS — F064 Anxiety disorder due to known physiological condition: Secondary | ICD-10-CM

## 2015-05-03 HISTORY — DX: Polycythemia vera: D45

## 2015-05-03 LAB — CBC WITH DIFFERENTIAL (CANCER CENTER ONLY)
BASO#: 0 10*3/uL (ref 0.0–0.2)
BASO%: 0.4 % (ref 0.0–2.0)
EOS%: 0.8 % (ref 0.0–7.0)
Eosinophils Absolute: 0.1 10*3/uL (ref 0.0–0.5)
HEMATOCRIT: 51.3 % — AB (ref 38.7–49.9)
HGB: 18 g/dL — ABNORMAL HIGH (ref 13.0–17.1)
LYMPH#: 2.2 10*3/uL (ref 0.9–3.3)
LYMPH%: 25.8 % (ref 14.0–48.0)
MCH: 31.6 pg (ref 28.0–33.4)
MCHC: 35.1 g/dL (ref 32.0–35.9)
MCV: 90 fL (ref 82–98)
MONO#: 0.8 10*3/uL (ref 0.1–0.9)
MONO%: 9.7 % (ref 0.0–13.0)
NEUT#: 5.4 10*3/uL (ref 1.5–6.5)
NEUT%: 63.3 % (ref 40.0–80.0)
PLATELETS: 239 10*3/uL (ref 145–400)
RBC: 5.69 10*6/uL (ref 4.20–5.70)
RDW: 13.3 % (ref 11.1–15.7)
WBC: 8.6 10*3/uL (ref 4.0–10.0)

## 2015-05-03 LAB — IRON AND TIBC
%SAT: 37 % (ref 20–55)
Iron: 117 ug/dL (ref 42–163)
TIBC: 318 ug/dL (ref 202–409)
UIBC: 201 ug/dL (ref 117–376)

## 2015-05-03 LAB — FERRITIN: Ferritin: 166 ng/ml (ref 22–316)

## 2015-05-03 MED ORDER — SODIUM CHLORIDE 0.9 % IV SOLN
Freq: Once | INTRAVENOUS | Status: AC
  Administered 2015-05-03: 11:00:00 via INTRAVENOUS

## 2015-05-03 MED ORDER — LORAZEPAM 2 MG/ML IJ SOLN
1.0000 mg | Freq: Once | INTRAMUSCULAR | Status: AC
Start: 2015-05-03 — End: 2015-05-03
  Administered 2015-05-03: 1 mg via INTRAVENOUS

## 2015-05-03 MED ORDER — LORAZEPAM 2 MG/ML IJ SOLN
INTRAMUSCULAR | Status: AC
  Filled 2015-05-03: qty 1

## 2015-05-03 NOTE — Progress Notes (Signed)
Hematology and Oncology Follow Up Visit  Stuart Hill [id number] [birth year] 45 y.o. 05/03/2015   Principle Diagnosis:   Polycythemia vera -JAK2 (-)  Current Therapy:   Phlebotomy to maintain hematocrit below 45% Patient is allergic to aspirin-he is taking garlic as a blood thinner  Interim History:  Stuart Hill is back for follow-up. We first saw him about 3 weeks ago. So far, his tests are somewhat indicative of polycythemia vera. Even though he is JAK2 negative, I think that given his incredibly low erythropoietin level of 1.9, this would indicate that he has an "all Stuart Hill" marrow.  He is not iron deficient.  He has a lot of anxiety. He does take Xanax for this.  After I saw him initially,I told to take one baby aspirin a day. He said when he took this, he broke out in a rash. He is no longer taking aspirin. He is taking over-the-counter garlic.  He is still trying to work. He does get fatigued. I think some of this has to do with his blood count being on the high side.  He comes in with his wife today. She is very nice. She asks a lot of pertinent questions.  He's had a lot of coffee. He still drinks quite a bit coffee. He is still smoking about a pack-a-day of cigarettes. We had to really watch out with this.  Overall, his performance status is ECOG 1.  iMedications:  Current outpatient prescriptions:  .  ALPRAZolam (XANAX) 1 MG tablet, TAKE 1 TAB BY MOUTH 3 TIMES DAILY AS NEEDED FOR ANXIETY., Disp: 90 tablet, Rfl: 1 .  azithromycin (ZITHROMAX) 250 MG tablet, Take 2 tabs PO x 1 dose, then 1 tab PO QD x 4 days (Patient not taking: Reported on 04/27/2015), Disp: 6 tablet, Rfl: 0  Current facility-administered medications:  .  0.9 %  sodium chloride infusion, , Intravenous, Once, Volanda Napoleon, MD .  LORazepam (ATIVAN) injection 1 mg, 1 mg, Intravenous, Once, Volanda Napoleon, MD  Allergies:  Allergies  Allergen Reactions  . Asa [Aspirin] Hives  . Advil [Ibuprofen]  Hives  . Afrin [Nasal Spray] Hives  . Antihistamines, Chlorpheniramine-Type   . Atrovent [Ipratropium] Hives  . Bc Powder [Aspirin-Salicylamide-Caffeine]   . Benadryl [Diphenhydramine Hcl] Hives  . Buprenorphine Hcl Hives  . Codeine Hives  . Gastromax [Metoclopramide]   . Morphine And Related Hives  . Oxymetazoline Hives  . [name] [Carisoprodol] Hives  . Tylenol [Acetaminophen] Hives  . Pheniramine Rash    Past Medical History, Surgical history, Social history, and Family History were reviewed and updated.  Review of System:  As above        Physical Exam:  height is 6\' 3"  (1.905 m) and weight is 210 lb (95.255 kg). His oral temperature is 98 F (36.7 C). His blood pressure is 141/88 and his pulse is 68. His respiration is 18.   Wt Readings from Last 3 Encounters:  05/03/15 210 lb (95.255 kg)  04/27/15 208 lb (94.348 kg)  04/15/15 214 lb 3.2 oz (97.16 kg)     Head and neck exam shows no ocular or oral lesions.there is no scleral icterus. He has no conjunctival inflammation. There is moderate facial plethora. He has no palpable cervical or supraclavicular lymph nodes. Lungs are clear bilaterally.cardiac exam regular rate and rhythm with no murmurs, rubs or bruits. Abdomen is soft. he has good bowel sounds. There is no fluid wave. There is no palpable liver or spleen tip. Back exam  shows no tenderness over the spine, ribs or hips. Extremities shows no clubbing, cyanosis or edema. Neurological exam shows no focal neurological deficits. Skin exam shows a little bit of a Ruddy complexion. He has no ecchymoses or petechia.  Lab Results  Component Value Date   WBC 8.6 05/03/2015   HGB 18.0* 05/03/2015   HCT 51.3* 05/03/2015   MCV 90 05/03/2015   PLT 239 05/03/2015     Chemistry      Component Value Date/Time   NA 137 04/10/2015 1301   NA 141 09/20/2014 1154   K 4.1 04/10/2015 1301   K 4.2 09/20/2014 1154   CL 104 09/20/2014 1154   CO2 25 04/10/2015 1301   CO2 25  09/20/2014 1154   BUN 15.3 04/10/2015 1301   BUN 10 09/20/2014 1154   CREATININE 0.9 04/10/2015 1301   CREATININE 0.89 09/20/2014 1154      Component Value Date/Time   CALCIUM 9.3 04/10/2015 1301   CALCIUM 9.0 09/20/2014 1154   ALKPHOS 61 04/10/2015 1301   ALKPHOS 56 09/20/2014 1154   AST 39* 04/10/2015 1301   AST 19 09/20/2014 1154   ALT 54 04/10/2015 1301   ALT 25 09/20/2014 1154   BILITOT 0.71 04/10/2015 1301   BILITOT 0.5 09/20/2014 1154         Impression and Plan: Stuart Hill is a 45 year old white male. I think he does have polycythemia. Again, his erythropoietin level is only 1.9 area and I think this correlates well with polycythemia.  I think we have to phlebotomize him. I would like to keep his hematocrit below 45%.   Again, he cannot take aspirin. I think the garlic would be okay. This is also an antiplatelet agent.    I would like to try to phlebotomize him for 3 weeks in a row. I need to get the iron out of his system.  We will plan to see him back in about 4 weeks. I then, we should be seeing a nice drop in his hematocrit.  I told him to cut back on the tobacco use. I told him back on the coffee drinking. Both of these definitely will have a impact on his polycythemia.  I spent about 40 minutes with he and his wife. They're both very nice. I really think that we can make him feel better so he will have more energy.   Volanda Napoleon, MD 4/7/201710:31 AM

## 2015-05-03 NOTE — Patient Instructions (Addendum)
Therapeutic Phlebotomy Therapeutic phlebotomy is the controlled removal of blood from a person's body for the purpose of treating a medical condition. The procedure is similar to donating blood. Usually, about a pint (470 mL, or 0.47L) of blood is removed. The average adult has 9-12 pints (4.3-5.7 L) of blood. Therapeutic phlebotomy may be used to treat the following medical conditions:  Hemochromatosis. This is a condition in which the blood contains too much iron.  Polycythemia vera. This is a condition in which the blood contains too many red blood cells.  Porphyria cutanea tarda. This is a disease in which an important part of hemoglobin is not made properly. It results in the buildup of abnormal amounts of porphyrins in the body.  Sickle cell disease. This is a condition in which the red blood cells form an abnormal crescent shape rather than a round shape. LET [facility] CARE PROVIDER KNOW ABOUT:  Any allergies you have.  All medicines you are taking, including vitamins, herbs, eye drops, creams, and over-the-counter medicines.  Previous problems you or members of your family have had with the use of anesthetics.  Any blood disorders you have.  Previous surgeries you have had.  Any medical conditions you may have. RISKS AND COMPLICATIONS Generally, this is a safe procedure. However, problems may occur, including:  Nausea or light-headedness.  Low blood pressure.  Soreness, bleeding, swelling, or bruising at the needle insertion site.  Infection. BEFORE THE PROCEDURE  Follow instructions from your health care provider about eating or drinking restrictions.  Ask your health care provider about changing or stopping your regular medicines. This is especially important if you are taking diabetes medicines or blood thinners.  Wear clothing with sleeves that can be raised above the elbow.  Plan to have someone take you home after the procedure.  You may have a blood sample  taken. PROCEDURE  A needle will be inserted into one of your veins.  Tubing and a collection bag will be attached to that needle.  Blood will flow through the needle and tubing into the collection bag.  You may be asked to open and close your hand slowly and continually during the entire collection.  After the specified amount of blood has been removed from your body, the collection bag and tubing will be clamped.  The needle will be removed from your vein.  Pressure will be held on the site of the needle insertion to stop the bleeding.  A bandage (dressing) will be placed over the needle insertion site. The procedure may vary among health care providers and hospitals. AFTER THE PROCEDURE  Your recovery will be assessed and monitored.  You can return to your normal activities as directed by your health care provider.   This information is not intended to replace advice given to you by your health care provider. Make sure you discuss any questions you have with your health care provider.   Document Released: 06/16/2010 Document Revised: 05/29/2014 Document Reviewed: 01/08/2014 Elsevier Interactive Patient Education 2016 [place] is a condition in which the body makes too many red blood cells and there is no known cause. The red blood cells (erythrocytes) are the cells which carry the oxygen in your blood stream to the cells of your body. Because of the increased red blood cells, the blood becomes thicker and does not circulate as well. It would be similar to your car having oil which is too thick so it cannot start and circulate as  well. When the blood is too thick it often causes headaches and dizziness. It may also cause blood clots. Even though the blood clots easier, these patients bleed easier. The bleeding is caused because the blood cells which help stop bleeding (platelets) do not function normally. It occurs in all age groups but is more  common in the 48 to 67 year age range. TREATMENT  The treatment of polycythemia vera for many years has been blood removal (phlebotomy) which is similar to blood removal in a blood bank, however this blood is not used for donation. Hydroxyurea is used to supplement phlebotomy. Aspirin is commonly given to thin the blood as long as the patient does not have a problem with bleeding. Other drugs are used based on the progression of the disease.   This information is not intended to replace advice given to you by your health care provider. Make sure you discuss any questions you have with your health care provider.   Document Released: 10/07/2000 Document Revised: 02/02/2014 Document Reviewed: 07/25/2014 Elsevier Interactive Patient Education [employer].

## 2015-05-03 NOTE — Progress Notes (Signed)
Stuart Hill presents today for phlebotomy per MD orders. Phlebotomy procedure started at 1200 and ended at 1215. 500 grams removed. Patient observed for 30 minutes after procedure without any incident. Patient tolerated procedure well. IV needle removed intact.

## 2015-05-09 ENCOUNTER — Ambulatory Visit (HOSPITAL_BASED_OUTPATIENT_CLINIC_OR_DEPARTMENT_OTHER): Payer: Self-pay

## 2015-05-09 ENCOUNTER — Other Ambulatory Visit: Payer: Self-pay | Admitting: Hematology & Oncology

## 2015-05-09 VITALS — BP 120/86 | HR 67 | Temp 98.2°F | Resp 16

## 2015-05-09 DIAGNOSIS — D45 Polycythemia vera: Secondary | ICD-10-CM

## 2015-05-09 NOTE — Patient Instructions (Addendum)
Polycythemia Vera Polycythemia Stuart Hill is a condition in which the body makes too many red blood cells and there is no known cause. The red blood cells (erythrocytes) are the cells which carry the oxygen in your blood stream to the cells of your body. Because of the increased red blood cells, the blood becomes thicker and does not circulate as well. It would be similar to your car having oil which is too thick so it cannot start and circulate as well. When the blood is too thick it often causes headaches and dizziness. It may also cause blood clots. Even though the blood clots easier, these patients bleed easier. The bleeding is caused because the blood cells which help stop bleeding (platelets) do not function normally. It occurs in all age groups but is more common in the 53 to 22 year age range. TREATMENT  The treatment of polycythemia vera for many years has been blood removal (phlebotomy) which is similar to blood removal in a blood bank, however this blood is not used for donation. Hydroxyurea is used to supplement phlebotomy. Aspirin is commonly given to thin the blood as long as the patient does not have a problem with bleeding. Other drugs are used based on the progression of the disease.   This information is not intended to replace advice given to you by your health care provider. Make sure you discuss any questions you have with your health care provider.   Document Released: 10/07/2000 Document Revised: 02/02/2014 Document Reviewed: 07/25/2014 Elsevier Interactive Patient Education 2016 [employer]. Therapeutic Phlebotomy Therapeutic phlebotomy is the controlled removal of blood from a person's body for the purpose of treating a medical condition. The procedure is similar to donating blood. Usually, about a pint (470 mL, or 0.47L) of blood is removed. The average adult has 9-12 pints (4.3-5.7 L) of blood. Therapeutic phlebotomy may be used to treat the following medical  conditions:  Hemochromatosis. This is a condition in which the blood contains too much iron.  Polycythemia vera. This is a condition in which the blood contains too many red blood cells.  Porphyria cutanea tarda. This is a disease in which an important part of hemoglobin is not made properly. It results in the buildup of abnormal amounts of porphyrins in the body.  Sickle cell disease. This is a condition in which the red blood cells form an abnormal crescent shape rather than a round shape. LET [facility] CARE PROVIDER KNOW ABOUT:  Any allergies you have.  All medicines you are taking, including vitamins, herbs, eye drops, creams, and over-the-counter medicines.  Previous problems you or members of your family have had with the use of anesthetics.  Any blood disorders you have.  Previous surgeries you have had.  Any medical conditions you may have. RISKS AND COMPLICATIONS Generally, this is a safe procedure. However, problems may occur, including:  Nausea or light-headedness.  Low blood pressure.  Soreness, bleeding, swelling, or bruising at the needle insertion site.  Infection. BEFORE THE PROCEDURE  Follow instructions from your health care provider about eating or drinking restrictions.  Ask your health care provider about changing or stopping your regular medicines. This is especially important if you are taking diabetes medicines or blood thinners.  Wear clothing with sleeves that can be raised above the elbow.  Plan to have someone take you home after the procedure.  You may have a blood sample taken. PROCEDURE  A needle will be inserted into one of your veins.  Tubing and  a collection bag will be attached to that needle.  Blood will flow through the needle and tubing into the collection bag.  You may be asked to open and close your hand slowly and continually during the entire collection.  After the specified amount of blood has been removed from your  body, the collection bag and tubing will be clamped.  The needle will be removed from your vein.  Pressure will be held on the site of the needle insertion to stop the bleeding.  A bandage (dressing) will be placed over the needle insertion site. The procedure may vary among health care providers and hospitals. AFTER THE PROCEDURE  Your recovery will be assessed and monitored.  You can return to your normal activities as directed by your health care provider.   This information is not intended to replace advice given to you by your health care provider. Make sure you discuss any questions you have with your health care provider.   Document Released: 06/16/2010 Document Revised: 05/29/2014 Document Reviewed: 01/08/2014 Elsevier Interactive Patient Education [employer].

## 2015-05-17 ENCOUNTER — Ambulatory Visit (HOSPITAL_BASED_OUTPATIENT_CLINIC_OR_DEPARTMENT_OTHER): Payer: Self-pay

## 2015-05-17 VITALS — BP 124/84 | HR 66 | Temp 97.9°F | Resp 20

## 2015-05-17 DIAGNOSIS — D45 Polycythemia vera: Secondary | ICD-10-CM

## 2015-05-17 NOTE — Patient Instructions (Signed)
Therapeutic Phlebotomy, Care After  Refer to this sheet in the next few weeks. These instructions provide you with information about caring for yourself after your procedure. Your health care provider may also give you more specific instructions. Your treatment has been planned according to current medical practices, but problems sometimes occur. Call your health care provider if you have any problems or questions after your procedure.  WHAT TO EXPECT AFTER THE PROCEDURE  After your procedure, it is common to have:   Light-headedness or dizziness. You may feel faint.   Nausea.   Tiredness.  HOME CARE INSTRUCTIONS  Activities   Return to your normal activities as directed by your health care provider. Most people can go back to their normal activities right away.   Avoid strenuous physical activity and heavy lifting or pulling for about 5 hours after the procedure. Do not lift anything that is heavier than 10 lb (4.5 kg).   Athletes should avoid strenuous exercise for at least 12 hours.   Change positions slowly for the remainder of the day. This will help to prevent light-headedness or fainting.   If you feel light-headed, lie down until the feeling goes away.  Eating and Drinking   Be sure to eat well-balanced meals for the next 24 hours.   Drink enough fluid to keep your urine clear or pale yellow.   Avoid drinking alcohol on the day that you had the procedure.  Care of the Needle Insertion Site   Keep your bandage dry. You can remove the bandage after about 5 hours or as directed by your health care provider.   If you have bleeding from the needle insertion site, elevate your arm and press firmly on the site until the bleeding stops.   If you have bruising at the site, apply ice to the area:   Put ice in a plastic bag.   Place a towel between your skin and the bag.   Leave the ice on for 20 minutes, 2-3 times a day for the first 24 hours.   If the swelling does not go away after 24 hours, apply  a warm, moist washcloth to the area for 20 minutes, 2-3 times a day.  General Instructions   Avoid smoking for at least 30 minutes after the procedure.   Keep all follow-up visits as directed by your health care provider. It is important to continue with further therapeutic phlebotomy treatments as directed.  SEEK MEDICAL CARE IF:   You have redness, swelling, or pain at the needle insertion site.   You have fluid, blood, or pus coming from the needle insertion site.   You feel light-headed, dizzy, or nauseated, and the feeling does not go away.   You notice new bruising at the needle insertion site.   You feel weaker than normal.   You have a fever or chills.  SEEK IMMEDIATE MEDICAL CARE IF:   You have severe nausea or vomiting.   You have chest pain.   You have trouble breathing.    This information is not intended to replace advice given to you by your health care provider. Make sure you discuss any questions you have with your health care provider.    Document Released: 06/16/2010 Document Revised: 05/29/2014 Document Reviewed: 01/08/2014  Elsevier Interactive Patient Education 2016 Elsevier Inc[employer]

## 2015-05-17 NOTE — Progress Notes (Signed)
Stuart Hill presents today for phlebotomy per MD orders. Phlebotomy procedure started at 1017 and ended at 1050 . Approximately 500 mls removed. Patient observed for 30 minutes after procedure without any incident. Patient tolerated procedure well. IV needle removed intact.

## 2015-05-31 ENCOUNTER — Ambulatory Visit (HOSPITAL_BASED_OUTPATIENT_CLINIC_OR_DEPARTMENT_OTHER): Payer: Self-pay | Admitting: Hematology & Oncology

## 2015-05-31 ENCOUNTER — Ambulatory Visit: Payer: Self-pay

## 2015-05-31 ENCOUNTER — Encounter: Payer: Self-pay | Admitting: Hematology & Oncology

## 2015-05-31 ENCOUNTER — Other Ambulatory Visit: Payer: Self-pay | Admitting: Nurse Practitioner

## 2015-05-31 ENCOUNTER — Other Ambulatory Visit (HOSPITAL_BASED_OUTPATIENT_CLINIC_OR_DEPARTMENT_OTHER): Payer: Self-pay

## 2015-05-31 VITALS — BP 133/84 | HR 73 | Temp 98.1°F | Resp 16 | Ht 75.0 in | Wt 205.0 lb

## 2015-05-31 DIAGNOSIS — D45 Polycythemia vera: Secondary | ICD-10-CM

## 2015-05-31 LAB — COMPREHENSIVE METABOLIC PANEL
ALK PHOS: 55 U/L (ref 40–150)
ALT: 20 U/L (ref 0–55)
ANION GAP: 7 meq/L (ref 3–11)
AST: 17 U/L (ref 5–34)
Albumin: 3.9 g/dL (ref 3.5–5.0)
BUN: 11.6 mg/dL (ref 7.0–26.0)
CO2: 27 meq/L (ref 22–29)
CREATININE: 0.9 mg/dL (ref 0.7–1.3)
Calcium: 9 mg/dL (ref 8.4–10.4)
Chloride: 105 mEq/L (ref 98–109)
EGFR: >90 mL/min/{1.73_m2} (ref >90)
GLUCOSE: 94 mg/dL (ref 70–140)
Potassium: 4 mEq/L (ref 3.5–5.1)
Sodium: 139 mEq/L (ref 136–145)
TOTAL PROTEIN: 7.2 g/dL (ref 6.4–8.3)
Total Bilirubin: 0.53 mg/dL (ref 0.20–1.20)

## 2015-05-31 LAB — CBC WITH DIFFERENTIAL (CANCER CENTER ONLY)
BASO#: 0.1 10*3/uL (ref 0.0–0.2)
BASO%: 0.7 % (ref 0.0–2.0)
EOS%: 1 % (ref 0.0–7.0)
Eosinophils Absolute: 0.1 10*3/uL (ref 0.0–0.5)
HEMATOCRIT: 44.6 % (ref 38.7–49.9)
HEMOGLOBIN: 15.8 g/dL (ref 13.0–17.1)
LYMPH#: 2.5 10*3/uL (ref 0.9–3.3)
LYMPH%: 27.1 % (ref 14.0–48.0)
MCH: 32 pg (ref 28.0–33.4)
MCHC: 35.4 g/dL (ref 32.0–35.9)
MCV: 91 fL (ref 82–98)
MONO#: 0.8 10*3/uL (ref 0.1–0.9)
MONO%: 8.5 % (ref 0.0–13.0)
NEUT%: 62.7 % (ref 40.0–80.0)
NEUTROS ABS: 5.9 10*3/uL (ref 1.5–6.5)
PLATELETS: 235 10*3/uL (ref 145–400)
RBC: 4.93 10*6/uL (ref 4.20–5.70)
RDW: 13.2 % (ref 11.1–15.7)
WBC: 9.4 10*3/uL (ref 4.0–10.0)

## 2015-05-31 NOTE — Progress Notes (Signed)
No phlebotomy needed per Dr. Marin Olp. Labs ok.

## 2015-05-31 NOTE — Progress Notes (Signed)
Hematology and Oncology Follow Up Visit  MATTTHEW KAWECKI [id number] 25-Sep-1970 45 y.o. 05/31/2015   Principle Diagnosis:   Polycythemia vera -JAK2 (-)  Current Therapy:   Phlebotomy to maintain hematocrit below 45% Patient is allergic to aspirin-he is taking garlic as a blood thinner  Interim History:  Mr. Kluttz is back for follow-up.he's been on a phlebotomy program. He's done well with this. His hemoglobin has been coming down nicely. The last time we checked his iron studies was abundant thick oh. His ferritin was 166 with an iron saturation of 37%.   He's been complaining of lot of reflux. He says that he's had some chest discomfort. He has had some shortness of breath with this. Again he drinks a lot of coffee. He does still smoke. I think this sounds like reflux. I told that he has really cut out the caffeine and tobacco use.  He probably needs to go back to see a cardiologist. I will defer this to his family doctor.   He's had some occasional blurred vision.  His been no nausea or vomiting.   Overall, his performance status is ECOG 1.  iMedications:  Current outpatient prescriptions:  .  ALPRAZolam (XANAX) 1 MG tablet, TAKE 1 TAB BY MOUTH 3 TIMES DAILY AS NEEDED FOR ANXIETY., Disp: 90 tablet, Rfl: 1  Allergies:  Allergies  Allergen Reactions  . Asa [Aspirin] Hives  . Advil [Ibuprofen] Hives  . Afrin [Nasal Spray] Hives  . Antihistamines, Chlorpheniramine-Type   . Atrovent [Ipratropium] Hives  . Bc Powder [Aspirin-Salicylamide-Caffeine]   . Benadryl [Diphenhydramine Hcl] Hives  . Buprenorphine Hcl Hives  . Codeine Hives  . Gastromax [Metoclopramide]   . Morphine And Related Hives  . Oxymetazoline Hives  . [name] [Carisoprodol] Hives  . Tylenol [Acetaminophen] Hives  . Pheniramine Rash    Past Medical History, Surgical history, Social history, and Family History were reviewed and updated.  Review of System:  As above  Physical Exam:  height is 6\' 3"   (1.905 m) and weight is 205 lb (92.987 kg). His oral temperature is 98.1 F (36.7 C). His blood pressure is 133/84 and his pulse is 73. His respiration is 16.   Wt Readings from Last 3 Encounters:  05/31/15 205 lb (92.987 kg)  05/03/15 210 lb (95.255 kg)  04/27/15 208 lb (94.348 kg)     Head and neck exam shows no ocular or oral lesions.there is no scleral icterus. He has no conjunctival inflammation. There is moderate facial plethora. He has no palpable cervical or supraclavicular lymph nodes. Lungs are clear bilaterally.cardiac exam regular rate and rhythm with no murmurs, rubs or bruits. Abdomen is soft. he has good bowel sounds. There is no fluid wave. There is no palpable liver or spleen tip. Back exam shows no tenderness over the spine, ribs or hips. Extremities shows no clubbing, cyanosis or edema. Neurological exam shows no focal neurological deficits. Skin exam shows a little bit of a Ruddy complexion. He has no ecchymoses or petechia.  Lab Results  Component Value Date   WBC 9.4 05/31/2015   HGB 15.8 05/31/2015   HCT 44.6 05/31/2015   MCV 91 05/31/2015   PLT 235 05/31/2015     Chemistry      Component Value Date/Time   NA 137 04/10/2015 1301   NA 141 09/20/2014 1154   K 4.1 04/10/2015 1301   K 4.2 09/20/2014 1154   CL 104 09/20/2014 1154   CO2 25 04/10/2015 1301   CO2 25 09/20/2014  1154   BUN 15.3 04/10/2015 1301   BUN 10 09/20/2014 1154   CREATININE 0.9 04/10/2015 1301   CREATININE 0.89 09/20/2014 1154      Component Value Date/Time   CALCIUM 9.3 04/10/2015 1301   CALCIUM 9.0 09/20/2014 1154   ALKPHOS 61 04/10/2015 1301   ALKPHOS 56 09/20/2014 1154   AST 39* 04/10/2015 1301   AST 19 09/20/2014 1154   ALT 54 04/10/2015 1301   ALT 25 09/20/2014 1154   BILITOT 0.71 04/10/2015 1301   BILITOT 0.5 09/20/2014 1154         Impression and Plan: Mr. Plachy is a 45 year old white male. I think he does have polycythemia. Again, his erythropoietin level is only  1.9.  I think this correlates well with polycythemia.  He has responded very well to phlebotomies. His hemoglobin and hematocrit come down quite nicely.  I still told him that he really has cut back on his caffeine. He says he has about 7 cups of coffee a day. He is also smoking.  I think his chest pressure is definitely reflux. I don't think it is cardiac. There is a history of cardiac disease in the family however.  I told him that he really needs to talk to his family doctor to see about the possibility of a referral to cardiology for a stress test.  Again, he has responded well to phlebotomies. He does have a lot of anxiety.  I think we probably get him back now in 3-4 weeks. We may have to phlebotomize him we see him back.  I told he and his wife that they really have to be careful with their caffeine intake as this clearly stimulates gastric acid production.   I spent about 25 minutes with he and his wife.    Volanda Napoleon, MD 5/5/20174:34 PM

## 2015-06-01 LAB — RETICULOCYTES: RETICULOCYTE COUNT: 0.8 % (ref 0.6–2.6)

## 2015-06-03 LAB — FERRITIN: Ferritin: 83 ng/ml (ref 22–316)

## 2015-06-03 LAB — IRON AND TIBC
%SAT: 19 % — AB (ref 20–55)
IRON: 64 ug/dL (ref 42–163)
TIBC: 328 ug/dL (ref 202–409)
UIBC: 264 ug/dL (ref 117–376)

## 2015-06-28 ENCOUNTER — Other Ambulatory Visit: Payer: Self-pay

## 2015-06-28 ENCOUNTER — Ambulatory Visit: Payer: Self-pay | Admitting: Hematology & Oncology

## 2015-07-06 ENCOUNTER — Other Ambulatory Visit: Payer: Self-pay | Admitting: Emergency Medicine

## 2015-07-08 ENCOUNTER — Telehealth: Payer: Self-pay | Admitting: Emergency Medicine

## 2015-07-08 NOTE — Telephone Encounter (Signed)
Patient request a refill on Xanax 1 MG. Pharmacy [facility] on [address].

## 2015-07-08 NOTE — Telephone Encounter (Signed)
Also a RF req routed to Dr Everlene Farrier

## 2015-07-09 ENCOUNTER — Other Ambulatory Visit: Payer: Self-pay | Admitting: Emergency Medicine

## 2015-07-09 DIAGNOSIS — F411 Generalized anxiety disorder: Secondary | ICD-10-CM

## 2015-07-09 MED ORDER — ALPRAZOLAM 1 MG PO TABS: ORAL_TABLET | ORAL | Status: DC

## 2015-07-09 NOTE — Telephone Encounter (Signed)
Faxed and pt notified.

## 2015-07-09 NOTE — Telephone Encounter (Signed)
Faxed and pt notified on VM.

## 2015-07-29 ENCOUNTER — Ambulatory Visit: Payer: Self-pay | Admitting: Hematology & Oncology

## 2015-07-29 ENCOUNTER — Other Ambulatory Visit: Payer: Self-pay

## 2015-08-05 ENCOUNTER — Ambulatory Visit (HOSPITAL_BASED_OUTPATIENT_CLINIC_OR_DEPARTMENT_OTHER): Payer: Self-pay | Admitting: Hematology & Oncology

## 2015-08-05 ENCOUNTER — Encounter: Payer: Self-pay | Admitting: Hematology & Oncology

## 2015-08-05 ENCOUNTER — Other Ambulatory Visit (HOSPITAL_BASED_OUTPATIENT_CLINIC_OR_DEPARTMENT_OTHER): Payer: Self-pay

## 2015-08-05 ENCOUNTER — Ambulatory Visit (HOSPITAL_BASED_OUTPATIENT_CLINIC_OR_DEPARTMENT_OTHER): Payer: Self-pay

## 2015-08-05 VITALS — BP 139/87 | HR 67 | Temp 97.4°F | Resp 18 | Ht 75.0 in | Wt 198.1 lb

## 2015-08-05 VITALS — BP 130/84 | HR 62 | Resp 20

## 2015-08-05 DIAGNOSIS — D45 Polycythemia vera: Secondary | ICD-10-CM

## 2015-08-05 DIAGNOSIS — Z720 Tobacco use: Secondary | ICD-10-CM

## 2015-08-05 LAB — CMP (CANCER CENTER ONLY)
ALK PHOS: 55 U/L (ref 26–84)
ALT(SGPT): 36 U/L (ref 10–47)
AST: 26 U/L (ref 11–38)
Albumin: 3.9 g/dL (ref 3.3–5.5)
BUN, Bld: 11 mg/dL (ref 7–22)
CALCIUM: 9.1 mg/dL (ref 8.0–10.3)
CHLORIDE: 102 meq/L (ref 98–108)
CO2: 27 meq/L (ref 18–33)
Creat: 1.1 mg/dl (ref 0.6–1.2)
GLUCOSE: 96 mg/dL (ref 73–118)
POTASSIUM: 4 meq/L (ref 3.3–4.7)
Sodium: 136 mEq/L (ref 128–145)
Total Bilirubin: 0.7 mg/dl (ref 0.20–1.60)
Total Protein: 7.5 g/dL (ref 6.4–8.1)

## 2015-08-05 LAB — CBC WITH DIFFERENTIAL (CANCER CENTER ONLY)
BASO#: 0 10*3/uL (ref 0.0–0.2)
BASO%: 0.4 % (ref 0.0–2.0)
EOS%: 0.3 % (ref 0.0–7.0)
Eosinophils Absolute: 0 10*3/uL (ref 0.0–0.5)
HEMATOCRIT: 52.2 % — AB (ref 38.7–49.9)
HEMOGLOBIN: 18.3 g/dL — AB (ref 13.0–17.1)
LYMPH#: 1.5 10*3/uL (ref 0.9–3.3)
LYMPH%: 15.8 % (ref 14.0–48.0)
MCH: 31.6 pg (ref 28.0–33.4)
MCHC: 35.1 g/dL (ref 32.0–35.9)
MCV: 90 fL (ref 82–98)
MONO#: 0.7 10*3/uL (ref 0.1–0.9)
MONO%: 7.4 % (ref 0.0–13.0)
NEUT#: 7.1 10*3/uL — ABNORMAL HIGH (ref 1.5–6.5)
NEUT%: 76.1 % (ref 40.0–80.0)
Platelets: 222 10*3/uL (ref 145–400)
RBC: 5.8 10*6/uL — ABNORMAL HIGH (ref 4.20–5.70)
RDW: 12.9 % (ref 11.1–15.7)
WBC: 9.3 10*3/uL (ref 4.0–10.0)

## 2015-08-05 LAB — IRON AND TIBC
%SAT: 15 % — ABNORMAL LOW (ref 20–55)
Iron: 50 ug/dL (ref 42–163)
TIBC: 335 ug/dL (ref 202–409)
UIBC: 285 ug/dL (ref 117–376)

## 2015-08-05 LAB — FERRITIN: Ferritin: 72 ng/ml (ref 22–316)

## 2015-08-05 NOTE — Patient Instructions (Signed)

## 2015-08-05 NOTE — Progress Notes (Signed)
Stuart Hill presents today for phlebotomy per MD orders. Phlebotomy procedure started at 1145  and ended at 1155. 500 grams removed. Patient observed for 30 minutes after procedure without any incident. Patient tolerated procedure well. IV needle removed intact.

## 2015-08-05 NOTE — Progress Notes (Signed)
Hematology and Oncology Follow Up Visit  Stuart Hill [id number] November 19, 1970 45 y.o. 08/05/2015   Principle Diagnosis:   Polycythemia vera -JAK2 (-)  Current Therapy:   Phlebotomy to maintain hematocrit below 45% Patient is allergic to aspirin-he is taking garlic as a blood thinner  Interim History:  Stuart Hill is back for follow-up. he and his wife have moved to a new home.. This is been quite stressful on him.   He has not been phlebotomized probably for a couple months easily.  We saw him back in May, his ferritin was 83 with an iron saturation 19%.   He has not had any problems with nausea or vomiting. He's had a little bit of headache.   He is still smoking quite a bit. He also is drinking quite a bit of coffee. I told him that he must have decaffeinated coughing and that he has cut back on the tobacco use.   He has had no itching in the hands or feet.   Has not been any problems with fever. He's had no obvious bleeding.   He is still working. He's had some occasional blurred vision.  His been no nausea or vomiting.   Overall, his performance status is ECOG 1.  iMedications:  Current outpatient prescriptions:  .  ALPRAZolam (XANAX) 1 MG tablet, TAKE 1 TAB BY MOUTH 3 TIMES DAILY AS NEEDED FOR ANXIETY., Disp: 90 tablet, Rfl: 2  Allergies:  Allergies  Allergen Reactions  . Asa [Aspirin] Hives  . Advil [Ibuprofen] Hives  . Afrin [Nasal Spray] Hives  . Antihistamines, Chlorpheniramine-Type   . Atrovent [Ipratropium] Hives  . Bc Powder [Aspirin-Salicylamide-Caffeine]   . Benadryl [Diphenhydramine Hcl] Hives  . Buprenorphine Hcl Hives  . Codeine Hives  . Gastromax [Metoclopramide]   . Morphine And Related Hives  . Oxymetazoline Hives  . [name] [Carisoprodol] Hives  . Tylenol [Acetaminophen] Hives  . Pheniramine Rash    Past Medical History, Surgical history, Social history, and Family History were reviewed and updated.  Review of System:  As  above  Physical Exam:  height is 6\' 3"  (1.905 m) and weight is 198 lb 1.3 oz (89.848 kg). His oral temperature is 97.4 F (36.3 C). His blood pressure is 139/87 and his pulse is 67. His respiration is 18.   Wt Readings from Last 3 Encounters:  08/05/15 198 lb 1.3 oz (89.848 kg)  05/31/15 205 lb (92.987 kg)  05/03/15 210 lb (95.255 kg)     Head and neck exam shows no ocular or oral lesions.there is no scleral icterus. He has no conjunctival inflammation. There is moderate facial plethora. He has no palpable cervical or supraclavicular lymph nodes. Lungs are clear bilaterally.cardiac exam regular rate and rhythm with no murmurs, rubs or bruits. Abdomen is soft. he has good bowel sounds. There is no fluid wave. There is no palpable liver or spleen tip. Back exam shows no tenderness over the spine, ribs or hips. Extremities shows no clubbing, cyanosis or edema. Neurological exam shows no focal neurological deficits. Skin exam shows a little bit of a Ruddy complexion. He has no ecchymoses or petechia.  Lab Results  Component Value Date   WBC 9.3 08/05/2015   HGB 18.3* 08/05/2015   HCT 52.2* 08/05/2015   MCV 90 08/05/2015   PLT 222 08/05/2015     Chemistry      Component Value Date/Time   NA 136 08/05/2015 1054   NA 139 05/31/2015 1408   NA 141 09/20/2014 1154  K 4.0 08/05/2015 1054   K 4.0 05/31/2015 1408   K 4.2 09/20/2014 1154   CL 102 08/05/2015 1054   CL 104 09/20/2014 1154   CO2 27 08/05/2015 1054   CO2 27 05/31/2015 1408   CO2 25 09/20/2014 1154   BUN 11 08/05/2015 1054   BUN 11.6 05/31/2015 1408   BUN 10 09/20/2014 1154   CREATININE 1.1 08/05/2015 1054   CREATININE 0.9 05/31/2015 1408      Component Value Date/Time   CALCIUM 9.1 08/05/2015 1054   CALCIUM 9.0 05/31/2015 1408   CALCIUM 9.0 09/20/2014 1154   ALKPHOS 55 08/05/2015 1054   ALKPHOS 55 05/31/2015 1408   ALKPHOS 56 09/20/2014 1154   AST 26 08/05/2015 1054   AST 17 05/31/2015 1408   AST 19 09/20/2014 1154    ALT 36 08/05/2015 1054   ALT 20 05/31/2015 1408   ALT 25 09/20/2014 1154   BILITOT 0.70 08/05/2015 1054   BILITOT 0.53 05/31/2015 1408   BILITOT 0.5 09/20/2014 1154         Impression and Plan: Stuart Hill is a 45 year old white male. I think he does have polycythemia. Again, his erythropoietin level is only 1.9.  I think this correlates well with polycythemia.  His hemoglobin and hematocrit are up way too much. He is smoking quite a bit. He still drinking a lot of coffee. I told him that he really has cut back on these 2 activities.  We will have to go ahead and give him a phlebotomy today. I think that he will need to be phlebotomized weekly for 2 additional weeks. Again we have to drive his hematocrit down to 45%. Marland Kitchen His last iron studies showed iron saturation of 19% so we are doing fairly well with the iron levels.  I would like to see him back in about a month.  I spent about 25 minutes with he and his wife.    Volanda Napoleon, MD 7/10/201711:52 AM

## 2015-08-06 LAB — RETICULOCYTES: Reticulocyte Count: 0.7 % (ref 0.6–2.6)

## 2015-08-13 ENCOUNTER — Ambulatory Visit (HOSPITAL_BASED_OUTPATIENT_CLINIC_OR_DEPARTMENT_OTHER): Payer: Self-pay

## 2015-08-13 VITALS — BP 112/73 | HR 85 | Temp 98.2°F | Resp 16

## 2015-08-13 DIAGNOSIS — D45 Polycythemia vera: Secondary | ICD-10-CM

## 2015-08-13 NOTE — Patient Instructions (Signed)

## 2015-08-19 ENCOUNTER — Ambulatory Visit (HOSPITAL_BASED_OUTPATIENT_CLINIC_OR_DEPARTMENT_OTHER): Payer: Self-pay

## 2015-08-19 VITALS — BP 117/79 | HR 78 | Temp 97.7°F | Resp 18

## 2015-08-19 DIAGNOSIS — D45 Polycythemia vera: Secondary | ICD-10-CM

## 2015-08-19 NOTE — Progress Notes (Signed)
Stuart Hill presents today for phlebotomy per MD orders. Phlebotomy procedure started at 1130 and ended at 1347. 500 ml  removed. Patient observed for 30 minutes after procedure without any incident. Patient tolerated procedure well. IV needle removed intact.

## 2015-08-19 NOTE — Patient Instructions (Signed)
Therapeutic Phlebotomy, Care After  Refer to this sheet in the next few weeks. These instructions provide you with information about caring for yourself after your procedure. Your health care provider may also give you more specific instructions. Your treatment has been planned according to current medical practices, but problems sometimes occur. Call your health care provider if you have any problems or questions after your procedure.  WHAT TO EXPECT AFTER THE PROCEDURE  After your procedure, it is common to have:   Light-headedness or dizziness. You may feel faint.   Nausea.   Tiredness.  HOME CARE INSTRUCTIONS  Activities   Return to your normal activities as directed by your health care provider. Most people can go back to their normal activities right away.   Avoid strenuous physical activity and heavy lifting or pulling for about 5 hours after the procedure. Do not lift anything that is heavier than 10 lb (4.5 kg).   Athletes should avoid strenuous exercise for at least 12 hours.   Change positions slowly for the remainder of the day. This will help to prevent light-headedness or fainting.   If you feel light-headed, lie down until the feeling goes away.  Eating and Drinking   Be sure to eat well-balanced meals for the next 24 hours.   Drink enough fluid to keep your urine clear or pale yellow.   Avoid drinking alcohol on the day that you had the procedure.  Care of the Needle Insertion Site   Keep your bandage dry. You can remove the bandage after about 5 hours or as directed by your health care provider.   If you have bleeding from the needle insertion site, elevate your arm and press firmly on the site until the bleeding stops.   If you have bruising at the site, apply ice to the area:   Put ice in a plastic bag.   Place a towel between your skin and the bag.   Leave the ice on for 20 minutes, 2-3 times a day for the first 24 hours.   If the swelling does not go away after 24 hours, apply  a warm, moist washcloth to the area for 20 minutes, 2-3 times a day.  General Instructions   Avoid smoking for at least 30 minutes after the procedure.   Keep all follow-up visits as directed by your health care provider. It is important to continue with further therapeutic phlebotomy treatments as directed.  SEEK MEDICAL CARE IF:   You have redness, swelling, or pain at the needle insertion site.   You have fluid, blood, or pus coming from the needle insertion site.   You feel light-headed, dizzy, or nauseated, and the feeling does not go away.   You notice new bruising at the needle insertion site.   You feel weaker than normal.   You have a fever or chills.  SEEK IMMEDIATE MEDICAL CARE IF:   You have severe nausea or vomiting.   You have chest pain.   You have trouble breathing.    This information is not intended to replace advice given to you by your health care provider. Make sure you discuss any questions you have with your health care provider.    Document Released: 06/16/2010 Document Revised: 05/29/2014 Document Reviewed: 01/08/2014  Elsevier Interactive Patient Education 2016 Elsevier Inc[employer]

## 2015-09-01 ENCOUNTER — Emergency Department (HOSPITAL_COMMUNITY)
Admission: EM | Admit: 2015-09-01 | Discharge: 2015-09-01 | Disposition: A | Discharge status: Home / Self Care | Payer: Self-pay | Attending: Emergency Medicine | Admitting: Emergency Medicine

## 2015-09-01 ENCOUNTER — Emergency Department (HOSPITAL_COMMUNITY): Payer: Self-pay

## 2015-09-01 ENCOUNTER — Encounter (HOSPITAL_COMMUNITY): Payer: Self-pay | Admitting: Emergency Medicine

## 2015-09-01 DIAGNOSIS — Z79899 Other long term (current) drug therapy: Secondary | ICD-10-CM | POA: Insufficient documentation

## 2015-09-01 DIAGNOSIS — I10 Essential (primary) hypertension: Secondary | ICD-10-CM | POA: Insufficient documentation

## 2015-09-01 DIAGNOSIS — R791 Abnormal coagulation profile: Secondary | ICD-10-CM | POA: Insufficient documentation

## 2015-09-01 DIAGNOSIS — R29810 Facial weakness: Secondary | ICD-10-CM

## 2015-09-01 DIAGNOSIS — G510 Bell's palsy: Secondary | ICD-10-CM | POA: Insufficient documentation

## 2015-09-01 DIAGNOSIS — F17210 Nicotine dependence, cigarettes, uncomplicated: Secondary | ICD-10-CM | POA: Insufficient documentation

## 2015-09-01 LAB — COMPREHENSIVE METABOLIC PANEL
ALBUMIN: 4.1 g/dL (ref 3.5–5.0)
ALT: 20 U/L (ref 17–63)
AST: 23 U/L (ref 15–41)
Alkaline Phosphatase: 58 U/L (ref 38–126)
Anion gap: 8 (ref 5–15)
BILIRUBIN TOTAL: 0.6 mg/dL (ref 0.3–1.2)
CHLORIDE: 103 mmol/L (ref 101–111)
CO2: 25 mmol/L (ref 22–32)
CREATININE: 0.95 mg/dL (ref 0.61–1.24)
Calcium: 9.1 mg/dL (ref 8.9–10.3)
GFR calc Af Amer: >60 mL/min (ref >60)
GFR calc non Af Amer: >60 mL/min (ref >60)
GLUCOSE: 98 mg/dL (ref 65–99)
POTASSIUM: 4.1 mmol/L (ref 3.5–5.1)
Sodium: 136 mmol/L (ref 135–145)
TOTAL PROTEIN: 7.4 g/dL (ref 6.5–8.1)

## 2015-09-01 LAB — I-STAT CHEM 8, ED
BUN: 7 mg/dL (ref 6–20)
CHLORIDE: 103 mmol/L (ref 101–111)
Calcium, Ion: 1.13 mmol/L (ref 1.13–1.30)
Creatinine, Ser: 0.9 mg/dL (ref 0.61–1.24)
Glucose, Bld: 96 mg/dL (ref 65–99)
HEMATOCRIT: 50 % (ref 39.0–52.0)
Hemoglobin: 17 g/dL (ref 13.0–17.0)
POTASSIUM: 3.9 mmol/L (ref 3.5–5.1)
Sodium: 140 mmol/L (ref 135–145)
TCO2: 27 mmol/L (ref 0–100)

## 2015-09-01 LAB — DIFFERENTIAL
BASOS PCT: 0 %
Basophils Absolute: 0 10*3/uL (ref 0.0–0.1)
EOS ABS: 0.1 10*3/uL (ref 0.0–0.7)
Eosinophils Relative: 0 %
LYMPHS ABS: 1.9 10*3/uL (ref 0.7–4.0)
Lymphocytes Relative: 14 %
MONO ABS: 1 10*3/uL (ref 0.1–1.0)
MONOS PCT: 7 %
Neutro Abs: 10.4 10*3/uL — ABNORMAL HIGH (ref 1.7–7.7)
Neutrophils Relative %: 79 %

## 2015-09-01 LAB — CBG MONITORING, ED: Glucose-Capillary: 102 mg/dL — ABNORMAL HIGH (ref 65–99)

## 2015-09-01 LAB — CBC
HEMATOCRIT: 48.9 % (ref 39.0–52.0)
Hemoglobin: 16.5 g/dL (ref 13.0–17.0)
MCH: 31.5 pg (ref 26.0–34.0)
MCHC: 33.7 g/dL (ref 30.0–36.0)
MCV: 93.3 fL (ref 78.0–100.0)
Platelets: 243 10*3/uL (ref 150–400)
RBC: 5.24 MIL/uL (ref 4.22–5.81)
RDW: 13.3 % (ref 11.5–15.5)
WBC: 13.4 10*3/uL — ABNORMAL HIGH (ref 4.0–10.5)

## 2015-09-01 LAB — PROTIME-INR
INR: 0.98
Prothrombin Time: 13 seconds (ref 11.4–15.2)

## 2015-09-01 LAB — APTT: aPTT: 28 seconds (ref 24–36)

## 2015-09-01 LAB — I-STAT TROPONIN, ED: Troponin i, poc: 0 ng/mL (ref 0.00–0.08)

## 2015-09-01 MED ORDER — PREDNISONE 20 MG PO TABS: 20.0000 mg | ORAL_TABLET | Freq: Two times a day (BID) | ORAL | 0 refills | Status: AC

## 2015-09-01 MED ORDER — VALACYCLOVIR HCL 1 G PO TABS: 1000.0000 mg | ORAL_TABLET | Freq: Three times a day (TID) | ORAL | 0 refills | Status: AC

## 2015-09-01 NOTE — ED Triage Notes (Signed)
Pt c/o headache to back of head x 4 days, tongue numbness onset yesterday and woke up today with left sided facial droop. Equal grips, speech clear, no arm drift.

## 2015-09-01 NOTE — Discharge Instructions (Signed)
Take medications as prescribed. Discontinue these medications if you experience a rash or any other concerning symptoms and return immediately to the emergency department. Follow-up with your primary care provider in 2 days to be  reevaluated. Follow-up with neurology if your symptoms do not improve in 1-2 weeks or worsen. Use eye drops in your eyes daily.  Return to emergency department with any new numbness/timing, new weakness, new or worsening headache, visual changes, nausea, vomiting, or any other concerning symptoms.

## 2015-09-01 NOTE — ED Provider Notes (Signed)
[facility] DEPT Provider Note   CSN: [id number] Arrival date & time: 09/01/15  1516  First Provider Contact:  None       History   Chief Complaint Chief Complaint  Patient presents with  . Headache  . Facial Droop  . Numbness    HPI Stuart Hill is a 45 y.o. male.  HPI   Patient is a 45 year old male with a history of polycythemia vera, HTN, anxiety who presents the emergency department with left-sided facial weakness since this morning. Patient states he's had a posterior left-sided headache for 3 days that feels like tightness, it is constant, nonradiating. He woke yesterday morning with the left sided tongue numbness. No associated symptoms. He has not tried anything for this. He denies extremity numbness/tingling, choking weakness, visual changes, chest pain, shortness of breath, dizziness, syncope, nausea, vomiting, abdominal pain, diarrhea.  Of note: Patient states once a year does get cold sores.  Past Medical History:  Diagnosis Date  . Allergy   . Anxiety   . Arthritis   . Erythrocytosis 04/10/2015  . Hyperlipidemia   . Hypertension   . Polycythemia vera ([place]) 05/03/2015    Patient Active Problem List   Diagnosis Date Noted  . Polycythemia vera ([place]) 05/03/2015  . Hemoptysis 04/03/2015  . Condylomata acuminata 01/18/2015  . Generalized anxiety disorder 03/05/2014    Past Surgical History:  Procedure Laterality Date  . DENTAL SURGERY    . HAND SURGERY         Home Medications    Prior to Admission medications   Medication Sig Start Date End Date Taking? Authorizing Provider  ALPRAZolam (XANAX) 1 MG tablet TAKE 1 TAB BY MOUTH 3 TIMES DAILY AS NEEDED FOR ANXIETY. 07/09/15  Yes Darlyne Russian, MD  GARLIC PO Take 1 tablet by mouth daily.   Yes Historical Provider, MD  predniSONE (DELTASONE) 20 MG tablet Take 1 tablet (20 mg total) by mouth 2 (two) times daily with a meal. 09/01/15 09/06/15  Kalman Drape, PA  valACYclovir (VALTREX) 1000 MG tablet  Take 1 tablet (1,000 mg total) by mouth 3 (three) times daily. 09/01/15 09/08/15  Kalman Drape, PA    Family History Family History  Problem Relation Age of Onset  . COPD Mother   . Fibromyalgia Mother   . Heart disease Father   . Depression Brother   . Heart disease Maternal Grandmother   . Stroke Maternal Grandmother   . Heart disease Maternal Grandfather   . Stroke Maternal Grandfather   . Heart disease Paternal Grandmother   . Stroke Paternal Grandmother   . Heart disease Paternal Grandfather   . Stroke Paternal Grandfather     Social History Social History  Substance Use Topics  . Smoking status: Current Every Day Smoker    Packs/day: 1.00    Years: 24.00    Types: Cigarettes  . Smokeless tobacco: Never Used  . Alcohol use 15.0 oz/week    25 Cans of beer per week     Allergies   Asa [aspirin]; Advil [ibuprofen]; Afrin [nasal spray]; Antihistamines, chlorpheniramine-type; Atrovent [ipratropium]; Bc powder [aspirin-salicylamide-caffeine]; Benadryl [diphenhydramine hcl]; Buprenorphine hcl; Codeine; Gastromax [metoclopramide]; Morphine and related; Oxymetazoline; [name] [carisoprodol]; Tylenol [acetaminophen]; Vicodin [hydrocodone-acetaminophen]; and Pheniramine   Review of Systems Review of Systems  Constitutional: Negative for chills and fever.  HENT: Negative for facial swelling, hearing loss and trouble swallowing.   Eyes: Negative for photophobia, redness and visual disturbance.  Respiratory: Negative for chest tightness and shortness of breath.  Cardiovascular: Negative for chest pain and leg swelling.  Gastrointestinal: Negative for abdominal pain, diarrhea, nausea and vomiting.  Musculoskeletal: Negative for back pain, neck pain and neck stiffness.  Skin: Negative for rash.  Allergic/Immunologic: Negative for immunocompromised state.  Neurological: Positive for facial asymmetry, speech difficulty, weakness, numbness and headaches. Negative for dizziness and  syncope.     Physical Exam Updated Vital Signs BP 135/86 (BP Location: Left Arm)   Pulse 80   Temp 99.6 F (37.6 C) (Oral)   Resp 20   Ht 6\' 3"  (1.905 m)   Wt 86.7 kg   SpO2 100%   BMI 23.88 kg/m   Physical Exam  Constitutional: Pt is oriented to person, place, and time. Pt appears well-developed and well-nourished. No distress.  HENT:  Head: Normocephalic and atraumatic, TTP to the posterior left occipital region.  Mouth/Throat: Oropharynx is clear and moist.  Eyes: Conjunctivae and EOM are normal. Pupils are equal, round, and reactive to light. No scleral icterus.  No horizontal, vertical or rotational nystagmus  Neck: Normal range of motion. Neck supple.  Full active and passive ROM without pain No nuchal rigidity or meningeal signs  Cardiovascular: Normal rate, regular rhythm and intact distal pulses including radial and DP 2+ bilaterally.   Pulmonary/Chest: Effort normal  No respiratory distress, lungs CTA.  Abdominal: Soft. There is no tenderness. There is no rebound and no guarding.  Musculoskeletal: Normal range of motion, no edema.  Neurological: Pt. is alert and oriented to person, place, and time. No cranial nerve deficit.  Exhibits normal muscle tone. Coordination normal.  Mental Status:  Alert, oriented, thought content appropriate. Speech fluent without evidence of aphasia. Able to follow 2 step commands without difficulty.  Cranial Nerves:  II:  Peripheral visual fields grossly normal, pupils equal, round, reactive to light III,IV, VI: ptosis not present, extra-ocular motions intact bilaterally  V,VII: smile not symmetric with decreased facial movement on the left facial light touch sensation equal VIII: hearing grossly normal bilaterally  IX,X: midline uvula rise  XI: bilateral shoulder shrug equal and strong XII: midline tongue extension  Motor:  5/5 in upper and lower extremities bilaterally including strong and equal grip strength and  dorsiflexion/plantar flexion Sensory: light touch normal in all extremities.  Cerebellar: normal finger-to-nose with bilateral upper extremities, pronator drift negative Gait: normal gait and balance Skin: Skin is warm and dry. No rash noted. Pt is not diaphoretic.  Psychiatric: Pt has a normal mood and affect. Behavior is normal. Judgment and thought content normal.  Nursing note and vitals reviewed.   ED Treatments / Results  Labs (all labs ordered are listed, but only abnormal results are displayed) Labs Reviewed  CBC - Abnormal; Notable for the following:       Result Value   WBC 13.4 (*)    All other components within normal limits  DIFFERENTIAL - Abnormal; Notable for the following:    Neutro Abs 10.4 (*)    All other components within normal limits  COMPREHENSIVE METABOLIC PANEL - Abnormal; Notable for the following:    BUN <5 (*)    All other components within normal limits  CBG MONITORING, ED - Abnormal; Notable for the following:    Glucose-Capillary 102 (*)    All other components within normal limits  PROTIME-INR  APTT  I-STAT TROPOININ, ED  I-STAT CHEM 8, ED    EKG  EKG Interpretation None       Radiology Ct Head Wo Contrast  Result Date: 09/01/2015 CLINICAL  DATA:  Left-sided headache EXAM: CT HEAD WITHOUT CONTRAST TECHNIQUE: Contiguous axial images were obtained from the base of the skull through the vertex without intravenous contrast. COMPARISON:  None. FINDINGS: The bony calvarium is intact. The ventricles are of normal size and configuration. No findings to suggest acute hemorrhage, acute infarction or space-occupying mass lesion are noted. IMPRESSION: No acute intracranial abnormality noted. Electronically Signed   By: Inez Catalina M.D.   On: 09/01/2015 16:12   Mr Brain Wo Contrast  Result Date: 09/01/2015 CLINICAL DATA:  Back of head ache for 4 days. Tongue numbness beginning yesterday. EXAM: MRI HEAD WITHOUT CONTRAST TECHNIQUE: Multiplanar, multiecho  pulse sequences of the brain and surrounding structures were obtained without intravenous contrast. COMPARISON:  09/01/2015 head CT FINDINGS: Calvarium and upper cervical spine: No focal marrow signal abnormality. Orbits: Negative. Sinuses and Mastoids: Small nonobstructive mucous retention cysts in the left maxillary antrum. Brain: No acute or remote infarct, hemorrhage, hydrocephalus, or mass lesion. No evidence of large vessel occlusion. No white matter disease or atrophy. IMPRESSION: Normal appearance of the brain.  No explanation for headache. Electronically Signed   By: Monte Fantasia M.D.   On: 09/01/2015 18:08    Procedures Procedures (including critical care time)  Medications Ordered in ED Medications - No data to display   Initial Impression / Assessment and Plan / ED Course  I have reviewed the triage vital signs and the nursing notes.  Pertinent labs & imaging results that were available during my care of the patient were reviewed by me and considered in my medical decision making (see chart for details).  Clinical Course   Patient with left sided facial weakness since this morning. No additional neurologic deficits on exam. Presentation and history concerning for Bell's palsy. CT scan and MRI of brain both negative for any acute intracranial abnormalities to explain patient's symptoms. Less concerning for hemorrhage or infarct. Patient with history of polycythemia vera, RBC counts within normal limits here in the ED. Less concerning for ischemic injury.   Instructed patient follow up with his PCP within 2-3 days. Instructed patient to follow-up with neurology within 1-2 weeks if symptoms do not improve. Patient discharged with steroids and Valtrex. Discussed proper eye care. Instructed patient to return to the ED symptoms worsen or he experienced any new numbness or weakness. Patient expressed understanding to the discharge instructions.   6:20 Pt seen by Dr. Stark Jock who agrees this  is likley a bells palsy and suggested we treat the pt with steroids and Valtrex.  Pt case discussed at pt seen by Dr. Stark Jock who agrees with above plan.   Final Clinical Impressions(s) / ED Diagnoses   Final diagnoses:  Bell's palsy  Facial droop    New Prescriptions Discharge Medication List as of 09/01/2015  7:01 PM    START taking these medications   Details  predniSONE (DELTASONE) 20 MG tablet Take 1 tablet (20 mg total) by mouth 2 (two) times daily with a meal., Starting Sun 09/01/2015, Until Fri 09/06/2015, Print    valACYclovir (VALTREX) 1000 MG tablet Take 1 tablet (1,000 mg total) by mouth 3 (three) times daily., Starting Sun 09/01/2015, Until Sun 09/08/2015, [place] Teneshia Hedeen, PA 09/01/15 2006    Veryl Speak, MD 09/02/15 1539

## 2015-09-01 NOTE — ED Notes (Signed)
Patient transported to MRI 

## 2015-09-05 ENCOUNTER — Ambulatory Visit: Payer: Self-pay

## 2015-09-09 ENCOUNTER — Other Ambulatory Visit (HOSPITAL_BASED_OUTPATIENT_CLINIC_OR_DEPARTMENT_OTHER): Payer: Self-pay

## 2015-09-09 ENCOUNTER — Ambulatory Visit (HOSPITAL_BASED_OUTPATIENT_CLINIC_OR_DEPARTMENT_OTHER): Payer: Self-pay

## 2015-09-09 ENCOUNTER — Ambulatory Visit (HOSPITAL_BASED_OUTPATIENT_CLINIC_OR_DEPARTMENT_OTHER): Payer: Self-pay | Admitting: Hematology & Oncology

## 2015-09-09 ENCOUNTER — Encounter: Payer: Self-pay | Admitting: Hematology & Oncology

## 2015-09-09 VITALS — BP 143/89 | HR 68 | Temp 97.5°F | Resp 16 | Ht 75.0 in | Wt 193.0 lb

## 2015-09-09 VITALS — BP 121/78 | HR 64

## 2015-09-09 DIAGNOSIS — G510 Bell's palsy: Secondary | ICD-10-CM

## 2015-09-09 DIAGNOSIS — D45 Polycythemia vera: Secondary | ICD-10-CM

## 2015-09-09 LAB — CBC WITH DIFFERENTIAL (CANCER CENTER ONLY)
BASO#: 0.1 10*3/uL (ref 0.0–0.2)
BASO%: 0.4 % (ref 0.0–2.0)
EOS ABS: 0.1 10*3/uL (ref 0.0–0.5)
EOS%: 0.9 % (ref 0.0–7.0)
HCT: 46.5 % (ref 38.7–49.9)
HEMOGLOBIN: 16.4 g/dL (ref 13.0–17.1)
LYMPH#: 3.2 10*3/uL (ref 0.9–3.3)
LYMPH%: 28.4 % (ref 14.0–48.0)
MCH: 31.6 pg (ref 28.0–33.4)
MCHC: 35.3 g/dL (ref 32.0–35.9)
MCV: 90 fL (ref 82–98)
MONO#: 1.3 10*3/uL — ABNORMAL HIGH (ref 0.1–0.9)
MONO%: 11.2 % (ref 0.0–13.0)
NEUT%: 59.1 % (ref 40.0–80.0)
NEUTROS ABS: 6.6 10*3/uL — AB (ref 1.5–6.5)
Platelets: 271 10*3/uL (ref 145–400)
RBC: 5.19 10*6/uL (ref 4.20–5.70)
RDW: 13.8 % (ref 11.1–15.7)
WBC: 11.3 10*3/uL — AB (ref 4.0–10.0)

## 2015-09-09 LAB — COMPREHENSIVE METABOLIC PANEL
ALT: 28 U/L (ref 0–55)
AST: 15 U/L (ref 5–34)
Albumin: 3.6 g/dL (ref 3.5–5.0)
Alkaline Phosphatase: 60 U/L (ref 40–150)
Anion Gap: 10 mEq/L (ref 3–11)
BILIRUBIN TOTAL: 0.45 mg/dL (ref 0.20–1.20)
BUN: 11.6 mg/dL (ref 7.0–26.0)
CO2: 25 meq/L (ref 22–29)
CREATININE: 1 mg/dL (ref 0.7–1.3)
Calcium: 9.1 mg/dL (ref 8.4–10.4)
Chloride: 103 mEq/L (ref 98–109)
EGFR: >90 mL/min/{1.73_m2} (ref >90)
GLUCOSE: 90 mg/dL (ref 70–140)
Potassium: 3.6 mEq/L (ref 3.5–5.1)
SODIUM: 138 meq/L (ref 136–145)
TOTAL PROTEIN: 7.3 g/dL (ref 6.4–8.3)

## 2015-09-09 LAB — LACTATE DEHYDROGENASE: LDH: 125 U/L (ref 125–245)

## 2015-09-09 NOTE — Progress Notes (Signed)
Hematology and Oncology Follow Up Visit  Stuart Hill [id number] 08-18-1970 45 y.o. 09/09/2015   Principle Diagnosis:   Polycythemia vera -JAK2 (-)  LEFT Bell's palsy  Current Therapy:   Phlebotomy to maintain hematocrit below 45% Patient is allergic to aspirin-he is taking garlic as a blood thinner  Interim History:  Stuart Hill is back for follow-up. Shockingly enough, he comes in with a eyepatch on his left eye. He has some slightly slurred speech. He appears to have had Bell's palsy. This happened about a week or so ago. He said he had a bad headache behind his left ear. He had numbness on his tongue. He can swallow. He cannot close his left eye.  He went to the emergency room. He was worked up thoroughly for a cerebrovascular accident. Nothing came back positive. An MRI of the brain which was normal.  He has seen an ophthalmologist. He has seen a neuro ophthalmologist. He will see a neurologist tomorrow.  When this happened, his hematocrit was 49%. We have been phlebotomizing him.  He is able to swallow. Again he has slightly slurred speech because of his tongue.  Overall, his performance status is ECOG 1.  iMedications:  Current Outpatient Prescriptions:  .  ALPRAZolam (XANAX) 1 MG tablet, TAKE 1 TAB BY MOUTH 3 TIMES DAILY AS NEEDED FOR ANXIETY., Disp: 90 tablet, Rfl: 2 .  GARLIC PO, Take 1 tablet by mouth daily., Disp: , Rfl:  .  predniSONE (DELTASONE) 20 MG tablet, Take 20 mg by mouth 2 (two) times daily with a meal., Disp: , Rfl:  .  valACYclovir (VALTREX) 1000 MG tablet, Take 1,000 mg by mouth 3 (three) times daily., Disp: , Rfl:   Allergies:  Allergies  Allergen Reactions  . Asa [Aspirin] Hives  . Advil [Ibuprofen] Hives  . Afrin [Nasal Spray] Hives  . Antihistamines, Chlorpheniramine-Type Hives  . Atrovent [Ipratropium] Hives  . Bc Powder [Aspirin-Salicylamide-Caffeine] Hives  . Benadryl [Diphenhydramine Hcl] Hives  . Buprenorphine Hcl Hives  . Codeine  Hives  . Gastromax [Metoclopramide] Hives  . Morphine And Related Hives  . Oxymetazoline Hives  . [name] [Carisoprodol] Hives  . Tylenol [Acetaminophen] Hives  . Vicodin [Hydrocodone-Acetaminophen] Hives, Swelling and Other (See Comments)    ANGIOEDEMA  . Pheniramine Hives and Rash    Past Medical History, Surgical history, Social history, and Family History were reviewed and updated.  Review of System:  As above  Physical Exam:  height is 6\' 3"  (1.905 m) and weight is 193 lb (87.5 kg). His oral temperature is 97.5 F (36.4 C). His blood pressure is 143/89 (abnormal) and his pulse is 68. His respiration is 16.   Wt Readings from Last 3 Encounters:  09/09/15 193 lb (87.5 kg)  09/01/15 191 lb 1 oz (86.7 kg)  08/05/15 198 lb 1.3 oz (89.8 kg)     Head and neck exam shows His left eye to be open. He cannot close the left eye. He has some slight flattening of the left naso- labial fold. He has a good gag reflex. I do not see anything with his ear canals. There is no adenopathy in the neck. The skin folds on the left forehead are slightly flattened. His lungs are clear. Cardiac exam regular rate and rhythm with no murmurs, rubs or bruits. Abdomen is soft. He has good bowel sounds. There is no fluid wave. There is no palpable liver or spleen tip. Back exam shows no tenderness over the spine, ribs or hips. Extremities shows  good strength bilaterally. Has good range most of his joints. 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  Lab Results  Component Value Date   WBC 11.3 (H) 09/09/2015   HGB 16.4 09/09/2015   HCT 46.5 09/09/2015   MCV 90 09/09/2015   PLT 271 09/09/2015     Chemistry      Component Value Date/Time   NA 138 09/09/2015 1351   K 3.6 09/09/2015 1351   CL 103 09/01/2015 1535   CL 102 08/05/2015 1054   CO2 25 09/09/2015 1351   BUN 11.6 09/09/2015 1351   CREATININE 1.0 09/09/2015 1351      Component Value Date/Time   CALCIUM 9.1 09/09/2015 1351    ALKPHOS 60 09/09/2015 1351   AST 15 09/09/2015 1351   ALT 28 09/09/2015 1351   BILITOT 0.45 09/09/2015 1351         Impression and Plan: Stuart Hill is a 45 year old white male. I think he does have polycythemia. Again, his erythropoietin level is only 1.9.  I think this correlates well with polycythemia.  I'm absolutely shocked as to what has happened to him. It looks like he has a left Bell's palsy. He has a eyepatch on. I'm still not sure how he got this. He is under a lot of stress.   He sees a Engineer, structural. He is on Valtrex. His scans. Do not show anything that looked like a vascular event that occurred in his brain.   I will try to phlebotomize him today. I do want his hematocrit below 45%.   If this is truly Bell's palsy, then I would think that this will improve over time.   I will like to get him back to see me in 3-4 weeks. I thing we have to keep close control over his blood counts. He cannot take aspirin. I don't think we have to get him on any Plavix or actual anticoagulation given his recent scan results.    Volanda Napoleon, MD 8/14/20174:53 PM

## 2015-09-09 NOTE — Progress Notes (Signed)
Stuart Hill presents today for phlebotomy per MD orders. Phlebotomy procedure started at 1508 and ended at 1515. 500 grams removed. Patient observed for 30 minutes after procedure without any incident. Patient tolerated procedure well. IV needle removed intact.

## 2015-09-09 NOTE — Patient Instructions (Signed)
Therapeutic Phlebotomy, Care After  Refer to this sheet in the next few weeks. These instructions provide you with information about caring for yourself after your procedure. Your health care provider may also give you more specific instructions. Your treatment has been planned according to current medical practices, but problems sometimes occur. Call your health care provider if you have any problems or questions after your procedure.  WHAT TO EXPECT AFTER THE PROCEDURE  After your procedure, it is common to have:   Light-headedness or dizziness. You may feel faint.   Nausea.   Tiredness.  HOME CARE INSTRUCTIONS  Activities   Return to your normal activities as directed by your health care provider. Most people can go back to their normal activities right away.   Avoid strenuous physical activity and heavy lifting or pulling for about 5 hours after the procedure. Do not lift anything that is heavier than 10 lb (4.5 kg).   Athletes should avoid strenuous exercise for at least 12 hours.   Change positions slowly for the remainder of the day. This will help to prevent light-headedness or fainting.   If you feel light-headed, lie down until the feeling goes away.  Eating and Drinking   Be sure to eat well-balanced meals for the next 24 hours.   Drink enough fluid to keep your urine clear or pale yellow.   Avoid drinking alcohol on the day that you had the procedure.  Care of the Needle Insertion Site   Keep your bandage dry. You can remove the bandage after about 5 hours or as directed by your health care provider.   If you have bleeding from the needle insertion site, elevate your arm and press firmly on the site until the bleeding stops.   If you have bruising at the site, apply ice to the area:   Put ice in a plastic bag.   Place a towel between your skin and the bag.   Leave the ice on for 20 minutes, 2-3 times a day for the first 24 hours.   If the swelling does not go away after 24 hours, apply  a warm, moist washcloth to the area for 20 minutes, 2-3 times a day.  General Instructions   Avoid smoking for at least 30 minutes after the procedure.   Keep all follow-up visits as directed by your health care provider. It is important to continue with further therapeutic phlebotomy treatments as directed.  SEEK MEDICAL CARE IF:   You have redness, swelling, or pain at the needle insertion site.   You have fluid, blood, or pus coming from the needle insertion site.   You feel light-headed, dizzy, or nauseated, and the feeling does not go away.   You notice new bruising at the needle insertion site.   You feel weaker than normal.   You have a fever or chills.  SEEK IMMEDIATE MEDICAL CARE IF:   You have severe nausea or vomiting.   You have chest pain.   You have trouble breathing.    This information is not intended to replace advice given to you by your health care provider. Make sure you discuss any questions you have with your health care provider.    Document Released: 06/16/2010 Document Revised: 05/29/2014 Document Reviewed: 01/08/2014  Elsevier Interactive Patient Education 2016 Elsevier Inc[employer]

## 2015-09-10 ENCOUNTER — Ambulatory Visit (INDEPENDENT_AMBULATORY_CARE_PROVIDER_SITE_OTHER): Payer: Self-pay | Admitting: Neurology

## 2015-09-10 ENCOUNTER — Encounter: Payer: Self-pay | Admitting: Neurology

## 2015-09-10 VITALS — BP 120/85 | HR 74 | Ht 75.0 in | Wt 193.4 lb

## 2015-09-10 DIAGNOSIS — G510 Bell's palsy: Secondary | ICD-10-CM

## 2015-09-10 LAB — IRON AND TIBC
%SAT: 25 % (ref 20–55)
Iron: 92 ug/dL (ref 42–163)
TIBC: 370 ug/dL (ref 202–409)
UIBC: 278 ug/dL (ref 117–376)

## 2015-09-10 LAB — FERRITIN: Ferritin: 21 ng/ml — ABNORMAL LOW (ref 22–316)

## 2015-09-10 NOTE — Patient Instructions (Signed)
I had a long discussion with the patient and his wife regarding his left-sided Bell's palsy. I encouraged him to use artificial tears liberally during the day to keep his left eye moist as well as to use Lacri-Lube ointment at night. I also advised him to wear an eye patch till the light sensitivity in his left eye improves. Recommend we check Lyme titer given his history of tick bite.  Patient has no insurance and will not be able to afford further imaging studies with contrast hence we'll hold off at the present time. I advised him to finish the course of prednisone and Valtrex which is currently taking. I expect gradual improvement over the next few months. Return for follow-up in 2 months or call earlier if necessary. Bell Palsy Bell palsy is a condition in which the muscles on one side of the face become paralyzed. This often causes one side of the face to droop. It is a common condition and most people recover completely. RISK FACTORS Risk factors for Bell palsy include:  Pregnancy.  Diabetes.  An infection by a virus, such as infections that cause cold sores. CAUSES  Bell palsy is caused by damage to or inflammation of a nerve in your face. It is unclear why this happens, but an infection by a virus may lead to it. Most of the time the reason it happens is unknown. SIGNS AND SYMPTOMS  Symptoms can range from mild to severe and can take place over a number of hours. Symptoms may include:  Being unable to:  Raise one or both eyebrows.  Close one or both eyes.  Feel parts of your face (facial numbness).  Drooping of the eyelid and corner of the mouth.  Weakness in the face.  Paralysis of half your face.  Loss of taste.  Sensitivity to loud noises.  Difficulty chewing.  Tearing up of the affected eye.  Dryness in the affected eye.  Drooling.  Pain behind one ear. DIAGNOSIS  Diagnosis of Bell palsy may include:  A medical history and physical exam.  An MRI.  A CT  scan.  Electromyography (EMG). This is a test that checks how your nerves are working. TREATMENT  Treatment may include antiviral medicine to help shorten the length of the condition. Sometimes treatment is not needed and the symptoms go away on their own. HOME CARE INSTRUCTIONS   Take medicines only as directed by your health care provider.  Do facial massages and exercises as directed by your health care provider.  If your eye is affected:  Use moisturizing eye drops to prevent drying of your eye as directed by your health care provider.  Protect your eye as directed by your health care provider. SEEK MEDICAL CARE IF:  Your symptoms do not get better or get worse.  You are drooling.  Your eye is red, irritated, or hurts. SEEK IMMEDIATE MEDICAL CARE IF:   Another part of your body feels weak or numb.  You have difficulty swallowing.  You have a fever along with symptoms of Bell palsy.  You develop neck pain. MAKE SURE YOU:   Understand these instructions.  Will watch your condition.  Will get help right away if you are not doing well or get worse.   This information is not intended to replace advice given to you by your health care provider. Make sure you discuss any questions you have with your health care provider.   Document Released: 01/12/2005 Document Revised: 10/03/2014 Document Reviewed: 04/21/2013 Elsevier  Interactive Patient Education 2016 [employer]  

## 2015-09-11 LAB — B. BURGDORFI ANTIBODIES

## 2015-09-11 NOTE — Progress Notes (Signed)
[facility] [address] [place]. [place] [address] [phone]       OFFICE CONSULT NOTE  [name] Date of Birth:  August 07, 1970 Medical Record Number:  [id number]   Referring MD:  Gevena Cotton Reason for Referral:  Bell`s palsy  HPI:  45 year Caucasian male who is accompanied today by his wife. Patient states that the he started with a mild left occipital headache on Friday 2 weeks ago he also noticed some numbness in the left thumb of his tongue. He went to sleep.Next morning felt sleepy most of the day but did not seek any help. The next day he noticed that left elbowis weak as well as numb. He was seen in the emergency room at [facility] on 08/31/15 and diagnosed with Bell's palsy. He subsequently was seen at [facility]. He hence noticed that his left eyeball deviated upwards) to close his eyes and Dr. Sabra Heck referred him to Dr. Frederico Hamman neuroophthalmologist who examined him and confirm that he had Bell's palsy. Patient has been using artificial tears to keep his eye moist as well as Lacri-Lube gel at night and wearing an eye patch. He has also been on a tapering course of prednisone as well as Valtrex and has a few more days to finish. He had MRI scan of the brain done which have personally reviewed the noncontrast study and was normal. Patient does not have a health insurance and is reluctant to undergo further workup. He does have a remote history of tick bite several months ago and he shows me a small scar on his chest and the left inframammary region. He does have mild arthralgias but denies a rash suggestive of Lyme's. He does have mild history of arthritis in the neck and does have gradually pop his neck a lot. He complains of mild posterior neck pain and headache. He denies significant redness of his eyes. He has no prior history of Bell's palsy. He has remote history of chickenpox as a child. He does admit to hyperacusis in the left ear as well  as some altered taste and sensation in the left cavernous tongue. He denies any double vision, loss of vision, vertigo, gait or balance problems. He states his hearing is normal.  ROS:   14 system review of systems is positive for  blurred vision, double vision, birth marks, joint pain, skin sensitivity, headache, numbness, slurred speech, anxiety, tumors sleep, restless legs and all other systems negative PMH:  Past Medical History:  Diagnosis Date  . Allergy   . Anxiety   . Arthritis   . Erythrocytosis 04/10/2015  . Hyperlipidemia   . Hypertension   . Polycythemia vera ([place]) 05/03/2015    Social History:  Social History   Social History  . Marital status: Married    Spouse name: N/A  . Number of children: N/A  . Years of education: N/A   Occupational History  . Not on file.   Social History Main Topics  . Smoking status: Current Every Day Smoker    Packs/day: 1.00    Years: 24.00    Types: Cigarettes  . Smokeless tobacco: Never Used  . Alcohol use 0.6 oz/week    1 Cans of beer per week     Comment: 6-10 beer a week  . Drug use: Unknown  . Sexual activity: Not on file   Other Topics Concern  . Not on file   Social History Narrative  . No narrative on file  Medications:   Current Outpatient Prescriptions on File Prior to Visit  Medication Sig Dispense Refill  . ALPRAZolam (XANAX) 1 MG tablet TAKE 1 TAB BY MOUTH 3 TIMES DAILY AS NEEDED FOR ANXIETY. 90 tablet 2  . valACYclovir (VALTREX) 1000 MG tablet Take 1,000 mg by mouth 3 (three) times daily.     No current facility-administered medications on file prior to visit.     Allergies:   Allergies  Allergen Reactions  . Asa [Aspirin] Hives  . Advil [Ibuprofen] Hives  . Afrin [Nasal Spray] Hives  . Antihistamines, Chlorpheniramine-Type Hives  . Atrovent [Ipratropium] Hives  . Bc Powder [Aspirin-Salicylamide-Caffeine] Hives  . Benadryl [Diphenhydramine Hcl] Hives  . Buprenorphine Hcl Hives  . Codeine Hives    . Gastromax [Metoclopramide] Hives  . Morphine And Related Hives  . Oxymetazoline Hives  . [name] [Carisoprodol] Hives  . Tylenol [Acetaminophen] Hives  . Vicodin [Hydrocodone-Acetaminophen] Hives, Swelling and Other (See Comments)    ANGIOEDEMA  . Pheniramine Hives and Rash    Physical Exam General: well developed, well nourished young Caucasian male, seated, in no evident distress Head: head normocephalic and atraumatic.   Neck: supple with no carotid or supraclavicular bruits Cardiovascular: regular rate and rhythm, no murmurs Musculoskeletal: no deformity Skin:  no rash/petichiae. Old scar  in the left inframammary region which patient states is a tick bite Vascular:  Normal pulses all extremities  Neurologic Exam Mental Status: Awake and fully alert. Oriented to place and time. Recent and remote memory intact. Attention span, concentration and fund of knowledge appropriate. Mood and affect appropriate.  Cranial Nerves: Fundoscopic exam reveals sharp disc margins. Pupils equal, briskly reactive to light. Extraocular movements full without nystagmus. Visual fields full to confrontation. Hearing intact. Facial sensation intact. .Left complete peripheral facial nerve palsy with weakness of forehead muscles, eye closure, facial muscles. Slight subjective diminished sensation on the left face but no objective sensory loss. Patient has marked mild hyperacusis in the left ear. Hearing appears normal. Tongue, palate moves normally and symmetrically.  Motor: Normal bulk and tone. Normal strength in all tested extremity muscles. Sensory.: intact to touch , pinprick , position and vibratory sensation.  Coordination: Rapid alternating movements normal in all extremities. Finger-to-nose and heel-to-shin performed accurately bilaterally. Gait and Station: Arises from chair without difficulty. Stance is normal. Gait demonstrates normal stride length and balance . Able to heel, toe and tandem walk  without difficulty.  Reflexes: 1+ and symmetric. Toes downgoing.       ASSESSMENT: 45 year old male with 43 day history of left peripheral facial nerve palsy likely from Bell's palsy. Remote history of tick bite raises concern for possible Lyme's disease.    PLAN: I had a long discussion with the patient and his wife regarding his left-sided Bell's palsy. I encouraged him to use artificial tears liberally during the day to keep his left eye moist as well as to use Lacri-Lube ointment at night. I also advised him to wear an eye patch till the light sensitivity in his left eye improves. Recommend we check Lyme titer given his history of tick bite.  Patient has no insurance and will not be able to afford further imaging studies with contrast hence we'll hold off at the present time. I advised him to finish the course of prednisone and Valtrex which is currently taking. I expect gradual improvement over the next few months. Greater than 50% time during this 45 minute consultation visit was spent on counseling and coordination of care about his  Bell's palsy. Return for follow-up in 2 months or call earlier if necessary. Antony Contras, MD  [facility] [address] [place], [place] [address]  Phone [phone] Fax [phone] Note: This document was prepared with digital dictation and possible smart phrase technology. Any transcriptional errors that result from this process are unintentional.

## 2015-10-01 ENCOUNTER — Encounter: Payer: Self-pay | Admitting: Hematology & Oncology

## 2015-10-01 ENCOUNTER — Ambulatory Visit (HOSPITAL_BASED_OUTPATIENT_CLINIC_OR_DEPARTMENT_OTHER): Payer: Self-pay

## 2015-10-01 ENCOUNTER — Other Ambulatory Visit (HOSPITAL_BASED_OUTPATIENT_CLINIC_OR_DEPARTMENT_OTHER): Payer: Self-pay

## 2015-10-01 ENCOUNTER — Ambulatory Visit (HOSPITAL_BASED_OUTPATIENT_CLINIC_OR_DEPARTMENT_OTHER): Payer: Self-pay | Admitting: Hematology & Oncology

## 2015-10-01 VITALS — BP 113/60 | HR 67

## 2015-10-01 VITALS — BP 115/71 | HR 72 | Temp 97.6°F | Resp 18 | Ht 75.0 in | Wt 192.0 lb

## 2015-10-01 DIAGNOSIS — D45 Polycythemia vera: Secondary | ICD-10-CM

## 2015-10-01 DIAGNOSIS — G510 Bell's palsy: Secondary | ICD-10-CM

## 2015-10-01 LAB — CBC WITH DIFFERENTIAL (CANCER CENTER ONLY)
BASO#: 0.1 10*3/uL (ref 0.0–0.2)
BASO%: 0.5 % (ref 0.0–2.0)
EOS ABS: 0.1 10*3/uL (ref 0.0–0.5)
EOS%: 0.6 % (ref 0.0–7.0)
HCT: 45.3 % (ref 38.7–49.9)
HEMOGLOBIN: 15.7 g/dL (ref 13.0–17.1)
LYMPH#: 1.8 10*3/uL (ref 0.9–3.3)
LYMPH%: 19.5 % (ref 14.0–48.0)
MCH: 31.5 pg (ref 28.0–33.4)
MCHC: 34.7 g/dL (ref 32.0–35.9)
MCV: 91 fL (ref 82–98)
MONO#: 0.8 10*3/uL (ref 0.1–0.9)
MONO%: 8.7 % (ref 0.0–13.0)
NEUT%: 70.7 % (ref 40.0–80.0)
NEUTROS ABS: 6.5 10*3/uL (ref 1.5–6.5)
Platelets: 270 10*3/uL (ref 145–400)
RBC: 4.98 10*6/uL (ref 4.20–5.70)
RDW: 13.9 % (ref 11.1–15.7)
WBC: 9.3 10*3/uL (ref 4.0–10.0)

## 2015-10-01 LAB — CMP (CANCER CENTER ONLY)
ALBUMIN: 3.6 g/dL (ref 3.3–5.5)
ALT(SGPT): 19 U/L (ref 10–47)
AST: 18 U/L (ref 11–38)
Alkaline Phosphatase: 62 U/L (ref 26–84)
BUN, Bld: 11 mg/dL (ref 7–22)
CALCIUM: 9 mg/dL (ref 8.0–10.3)
CHLORIDE: 103 meq/L (ref 98–108)
CO2: 30 meq/L (ref 18–33)
CREATININE: 1 mg/dL (ref 0.6–1.2)
Glucose, Bld: 103 mg/dL (ref 73–118)
Potassium: 3.7 mEq/L (ref 3.3–4.7)
SODIUM: 136 meq/L (ref 128–145)
Total Bilirubin: 0.8 mg/dl (ref 0.20–1.60)
Total Protein: 7.3 g/dL (ref 6.4–8.1)

## 2015-10-01 NOTE — Patient Instructions (Signed)
Therapeutic Phlebotomy, Care After  Refer to this sheet in the next few weeks. These instructions provide you with information about caring for yourself after your procedure. Your health care provider may also give you more specific instructions. Your treatment has been planned according to current medical practices, but problems sometimes occur. Call your health care provider if you have any problems or questions after your procedure.  WHAT TO EXPECT AFTER THE PROCEDURE  After your procedure, it is common to have:   Light-headedness or dizziness. You may feel faint.   Nausea.   Tiredness.  HOME CARE INSTRUCTIONS  Activities   Return to your normal activities as directed by your health care provider. Most people can go back to their normal activities right away.   Avoid strenuous physical activity and heavy lifting or pulling for about 5 hours after the procedure. Do not lift anything that is heavier than 10 lb (4.5 kg).   Athletes should avoid strenuous exercise for at least 12 hours.   Change positions slowly for the remainder of the day. This will help to prevent light-headedness or fainting.   If you feel light-headed, lie down until the feeling goes away.  Eating and Drinking   Be sure to eat well-balanced meals for the next 24 hours.   Drink enough fluid to keep your urine clear or pale yellow.   Avoid drinking alcohol on the day that you had the procedure.  Care of the Needle Insertion Site   Keep your bandage dry. You can remove the bandage after about 5 hours or as directed by your health care provider.   If you have bleeding from the needle insertion site, elevate your arm and press firmly on the site until the bleeding stops.   If you have bruising at the site, apply ice to the area:   Put ice in a plastic bag.   Place a towel between your skin and the bag.   Leave the ice on for 20 minutes, 2-3 times a day for the first 24 hours.   If the swelling does not go away after 24 hours, apply  a warm, moist washcloth to the area for 20 minutes, 2-3 times a day.  General Instructions   Avoid smoking for at least 30 minutes after the procedure.   Keep all follow-up visits as directed by your health care provider. It is important to continue with further therapeutic phlebotomy treatments as directed.  SEEK MEDICAL CARE IF:   You have redness, swelling, or pain at the needle insertion site.   You have fluid, blood, or pus coming from the needle insertion site.   You feel light-headed, dizzy, or nauseated, and the feeling does not go away.   You notice new bruising at the needle insertion site.   You feel weaker than normal.   You have a fever or chills.  SEEK IMMEDIATE MEDICAL CARE IF:   You have severe nausea or vomiting.   You have chest pain.   You have trouble breathing.    This information is not intended to replace advice given to you by your health care provider. Make sure you discuss any questions you have with your health care provider.    Document Released: 06/16/2010 Document Revised: 05/29/2014 Document Reviewed: 01/08/2014  Elsevier Interactive Patient Education 2016 Elsevier Inc[employer]

## 2015-10-01 NOTE — Progress Notes (Signed)
Hematology and Oncology Follow Up Visit  Stuart Hill [id number] 06/20/1970 45 y.o. 10/01/2015   Principle Diagnosis:   Polycythemia vera -JAK2 (-)  LEFT Bell's palsy  Current Therapy:   Phlebotomy to maintain hematocrit below 45% Patient is allergic to aspirin-he is taking garlic as a blood thinner  Interim History:  Stuart Hill is back for follow-up. He is doing much better. The Bell's palsy on the left side of his face is improving. He is able to blink his left eye now.  He had a good Labor Day weekend. He did not do all that much.  He is responding well to the phlebotomies. His hematocrit keeps coming down.  He is still smoking. He possibly smokes about half pack per day. He is trying to cut back.  He has had no problems with nausea or vomiting. He's had no rashes. He's had no change in bowel or bladder habits. He's had no leg swelling.  Overall, his performance status is ECOG 1.  iMedications:  Current Outpatient Prescriptions:  .  ALPRAZolam (XANAX) 1 MG tablet, TAKE 1 TAB BY MOUTH 3 TIMES DAILY AS NEEDED FOR ANXIETY., Disp: 90 tablet, Rfl: 2  Allergies:  Allergies  Allergen Reactions  . Asa [Aspirin] Hives  . Advil [Ibuprofen] Hives  . Afrin [Nasal Spray] Hives  . Antihistamines, Chlorpheniramine-Type Hives  . Atrovent [Ipratropium] Hives  . Bc Powder [Aspirin-Salicylamide-Caffeine] Hives  . Benadryl [Diphenhydramine Hcl] Hives  . Buprenorphine Hcl Hives  . Codeine Hives  . Gastromax [Metoclopramide] Hives  . Morphine And Related Hives  . Oxymetazoline Hives  . [name] [Carisoprodol] Hives  . Tylenol [Acetaminophen] Hives  . Vicodin [Hydrocodone-Acetaminophen] Hives, Swelling and Other (See Comments)    ANGIOEDEMA  . Pheniramine Hives and Rash    Past Medical History, Surgical history, Social history, and Family History were reviewed and updated.  Review of System:  As above  Physical Exam:  height is 6\' 3"  (1.905 m) and weight is 192 lb (87.1 kg).  His oral temperature is 97.6 F (36.4 C). His blood pressure is 115/71 and his pulse is 72. His respiration is 18.   Wt Readings from Last 3 Encounters:  10/01/15 192 lb (87.1 kg)  09/10/15 193 lb 6.4 oz (87.7 kg)  09/09/15 193 lb (87.5 kg)     Head and neck exam shows His left eye to be open. He cannot close the left eye. He has some slight flattening of the left naso- labial fold. He has a good gag reflex. I do not see anything with his ear canals. There is no adenopathy in the neck. The skin folds on the left forehead are slightly flattened. His lungs are clear. Cardiac exam regular rate and rhythm with no murmurs, rubs or bruits. Abdomen is soft. He has good bowel sounds. There is no fluid wave. There is no palpable liver or spleen tip. Back exam shows no tenderness over the spine, ribs or hips. Extremities shows good strength bilaterally. Has good range most of his joints. 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  Lab Results  Component Value Date   WBC 9.3 10/01/2015   HGB 15.7 10/01/2015   HCT 45.3 10/01/2015   MCV 91 10/01/2015   PLT 270 10/01/2015     Chemistry      Component Value Date/Time   NA 136 10/01/2015 1116   NA 138 09/09/2015 1351   K 3.7 10/01/2015 1116   K 3.6 09/09/2015 1351   CL 103 10/01/2015  1116   CO2 30 10/01/2015 1116   CO2 25 09/09/2015 1351   BUN 11 10/01/2015 1116   BUN 11.6 09/09/2015 1351   CREATININE 1.0 10/01/2015 1116   CREATININE 1.0 09/09/2015 1351      Component Value Date/Time   CALCIUM 9.0 10/01/2015 1116   CALCIUM 9.1 09/09/2015 1351   ALKPHOS 62 10/01/2015 1116   ALKPHOS 60 09/09/2015 1351   AST 18 10/01/2015 1116   AST 15 09/09/2015 1351   ALT 19 10/01/2015 1116   ALT 28 09/09/2015 1351   BILITOT 0.80 10/01/2015 1116   BILITOT 0.45 09/09/2015 1351         Impression and Plan: Stuart Hill is a 45 year old white male. I think he does have polycythemia. Again, his erythropoietin level is only 1.9.  I  think this correlates well with polycythemia.  We will go ahead and phlebotomize him today. I do think that this would be worthwhile.   Again, I'm glad of the Bell's palsy is improving.  I'll plan to get him back in 6 weeks. I think this would be reasonable. Hopefully, we will keep his hematocrit below 45%. He cannot take aspirin so we have to be aggressive with the phlebotomies.     Volanda Napoleon, MD 9/5/201712:13 PM

## 2015-10-01 NOTE — Progress Notes (Signed)
Stuart Hill presents today for phlebotomy per MD orders. Phlebotomy procedure started at 1215 and ended at 1221. 500 grams removed. Patient observed for 30 minutes after procedure without any incident. Patient tolerated procedure well. IV needle removed intact.

## 2015-10-09 ENCOUNTER — Other Ambulatory Visit: Payer: Self-pay | Admitting: Emergency Medicine

## 2015-10-09 DIAGNOSIS — F411 Generalized anxiety disorder: Secondary | ICD-10-CM

## 2015-10-10 NOTE — Telephone Encounter (Signed)
Faxed

## 2015-10-21 ENCOUNTER — Telehealth: Payer: Self-pay

## 2015-10-21 NOTE — Telephone Encounter (Signed)
LFt vm for patient to call back about lab results.

## 2015-10-21 NOTE — Telephone Encounter (Signed)
-----   Message from Garvin Fila, MD sent at 10/17/2015  3:52 PM EDT ----- Stuart Hill inform the patient and Lyme antibody test was negative

## 2015-10-21 NOTE — Telephone Encounter (Signed)
Advised patient per Anderson Malta and previous note that Lyme antibody was negative.

## 2015-10-22 ENCOUNTER — Telehealth: Payer: Self-pay

## 2015-10-22 NOTE — Telephone Encounter (Signed)
Stuart Hill      10/21/15 2:57 PM  Note    Advised patient per Anderson Malta and previous note that Lyme antibody was negative

## 2015-10-22 NOTE — Telephone Encounter (Signed)
Notes Recorded by Marval Regal, RN on 10/22/2015 at 11:36 AM EDT LFt vm for patient to call back about lab results. ------  Notes Recorded by Marval Regal, RN on 10/21/2015 at 1:58 PM EDT LFt vm for patient to call back about lab results. ------  Notes Recorded by Garvin Fila, MD on 10/17/2015 at 3:52 PM EDT Kindly inform the patient and Lyme antibody test was negative

## 2015-10-22 NOTE — Telephone Encounter (Signed)
-----   Message from Garvin Fila, MD sent at 10/17/2015  3:52 PM EDT ----- Stuart Hill inform the patient and Lyme antibody test was negative

## 2015-11-12 ENCOUNTER — Encounter: Payer: Self-pay | Admitting: Hematology & Oncology

## 2015-11-12 ENCOUNTER — Other Ambulatory Visit (HOSPITAL_BASED_OUTPATIENT_CLINIC_OR_DEPARTMENT_OTHER): Payer: Self-pay

## 2015-11-12 ENCOUNTER — Ambulatory Visit (HOSPITAL_BASED_OUTPATIENT_CLINIC_OR_DEPARTMENT_OTHER): Payer: Self-pay

## 2015-11-12 ENCOUNTER — Ambulatory Visit (HOSPITAL_BASED_OUTPATIENT_CLINIC_OR_DEPARTMENT_OTHER): Payer: Self-pay | Admitting: Hematology & Oncology

## 2015-11-12 VITALS — BP 115/95 | HR 73 | Temp 97.8°F | Resp 16 | Ht 75.0 in | Wt 191.0 lb

## 2015-11-12 VITALS — BP 111/70 | HR 76 | Resp 16

## 2015-11-12 DIAGNOSIS — D45 Polycythemia vera: Secondary | ICD-10-CM

## 2015-11-12 LAB — IRON AND TIBC
%SAT: 9 % — ABNORMAL LOW (ref 20–55)
IRON: 35 ug/dL — AB (ref 42–163)
TIBC: 391 ug/dL (ref 202–409)
UIBC: 357 ug/dL (ref 117–376)

## 2015-11-12 LAB — COMPREHENSIVE METABOLIC PANEL
ALBUMIN: 3.6 g/dL (ref 3.5–5.0)
ALK PHOS: 66 U/L (ref 40–150)
ALT: 14 U/L (ref 0–55)
ANION GAP: 10 meq/L (ref 3–11)
AST: 13 U/L (ref 5–34)
BUN: 13.1 mg/dL (ref 7.0–26.0)
CALCIUM: 8.9 mg/dL (ref 8.4–10.4)
CHLORIDE: 107 meq/L (ref 98–109)
CO2: 22 mEq/L (ref 22–29)
Creatinine: 0.9 mg/dL (ref 0.7–1.3)
Glucose: 115 mg/dl (ref 70–140)
POTASSIUM: 3.7 meq/L (ref 3.5–5.1)
Sodium: 139 mEq/L (ref 136–145)
Total Bilirubin: <0.22 mg/dL (ref 0.20–1.20)
Total Protein: 7.2 g/dL (ref 6.4–8.3)

## 2015-11-12 LAB — CBC WITH DIFFERENTIAL (CANCER CENTER ONLY)
BASO#: 0.1 10*3/uL (ref 0.0–0.2)
BASO%: 0.6 % (ref 0.0–2.0)
EOS%: 1.3 % (ref 0.0–7.0)
Eosinophils Absolute: 0.1 10*3/uL (ref 0.0–0.5)
HEMATOCRIT: 45 % (ref 38.7–49.9)
HGB: 15.4 g/dL (ref 13.0–17.1)
LYMPH#: 2.1 10*3/uL (ref 0.9–3.3)
LYMPH%: 19.4 % (ref 14.0–48.0)
MCH: 30.7 pg (ref 28.0–33.4)
MCHC: 34.2 g/dL (ref 32.0–35.9)
MCV: 90 fL (ref 82–98)
MONO#: 1.1 10*3/uL — AB (ref 0.1–0.9)
MONO%: 9.5 % (ref 0.0–13.0)
NEUT#: 7.6 10*3/uL — ABNORMAL HIGH (ref 1.5–6.5)
NEUT%: 69.2 % (ref 40.0–80.0)
PLATELETS: 271 10*3/uL (ref 145–400)
RBC: 5.02 10*6/uL (ref 4.20–5.70)
RDW: 13.2 % (ref 11.1–15.7)
WBC: 11 10*3/uL — AB (ref 4.0–10.0)

## 2015-11-12 LAB — FERRITIN: Ferritin: 15 ng/ml — ABNORMAL LOW (ref 22–316)

## 2015-11-12 NOTE — Progress Notes (Signed)
Hematology and Oncology Follow Up Visit  Stuart Hill [id number] Jun 09, 1970 45 y.o. 11/12/2015   Principle Diagnosis:   Polycythemia vera -JAK2 (-)  LEFT Bell's palsy  Current Therapy:   Phlebotomy to maintain hematocrit below 45% Patient is allergic to aspirin-he is taking garlic as a blood thinner  Interim History:  Stuart Hill is back for follow-up. He looks better. He does not feel better. He says he does a lot of sleeping. He is still working. There are issues at work that have stressed him out.   He probably is smoking a little bit more. He drinks quite a bit of caffeine. He loves coffee.   His mother will be moving in with he and his wife. She has had some issues since the death of a son. He is doing a good thing by helping her out and move her in.   We are making him on deficient. Back in August, his ferritin was only 21 with iron saturation of 25%.   His Bell's palsy is pretty much resolved. He still may have a little bit of neurologic issue and weakness but overall, his eyes pretty much looked similar.   Overall, his performance status is ECOG 1.  iMedications:  Current Outpatient Prescriptions:  .  ALPRAZolam (XANAX) 1 MG tablet, TAKE 1 TABLET BY MOUTH 3 TIMES A DAY AS NEEDED FOR ANXIETY, Disp: 90 tablet, Rfl: 2  Allergies:  Allergies  Allergen Reactions  . Asa [Aspirin] Hives  . Advil [Ibuprofen] Hives  . Afrin [Nasal Spray] Hives  . Antihistamines, Chlorpheniramine-Type Hives  . Atrovent [Ipratropium] Hives  . Bc Powder [Aspirin-Salicylamide-Caffeine] Hives  . Benadryl [Diphenhydramine Hcl] Hives  . Buprenorphine Hcl Hives  . Codeine Hives  . Gastromax [Metoclopramide] Hives  . Morphine And Related Hives  . Oxymetazoline Hives  . [name] [Carisoprodol] Hives  . Tylenol [Acetaminophen] Hives  . Vicodin [Hydrocodone-Acetaminophen] Hives, Swelling and Other (See Comments)    ANGIOEDEMA  . Pheniramine Hives and Rash    Past Medical History, Surgical  history, Social history, and Family History were reviewed and updated.  Review of System:  As above  Physical Exam:  height is 6\' 3"  (1.905 m) and weight is 191 lb (86.6 kg). His oral temperature is 97.8 F (36.6 C). His blood pressure is 115/95 (abnormal) and his pulse is 73. His respiration is 16.   Wt Readings from Last 3 Encounters:  11/12/15 191 lb (86.6 kg)  10/01/15 192 lb (87.1 kg)  09/10/15 193 lb 6.4 oz (87.7 kg)     Head and neck exam shows His left eye to be open. He cannot close the left eye. He has some slight flattening of the left naso- labial fold. He has a good gag reflex. I do not see anything with his ear canals. There is no adenopathy in the neck. The skin folds on the left forehead are slightly flattened. His lungs are clear. Cardiac exam regular rate and rhythm with no murmurs, rubs or bruits. Abdomen is soft. He has good bowel sounds. There is no fluid wave. There is no palpable liver or spleen tip. Back exam shows no tenderness over the spine, ribs or hips. Extremities shows good strength bilaterally. Has good range most of his joints. 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  Lab Results  Component Value Date   WBC 11.0 (H) 11/12/2015   HGB 15.4 11/12/2015   HCT 45.0 11/12/2015   MCV 90 11/12/2015   PLT 271 11/12/2015  Chemistry      Component Value Date/Time   NA 136 10/01/2015 1116   NA 138 09/09/2015 1351   K 3.7 10/01/2015 1116   K 3.6 09/09/2015 1351   CL 103 10/01/2015 1116   CO2 30 10/01/2015 1116   CO2 25 09/09/2015 1351   BUN 11 10/01/2015 1116   BUN 11.6 09/09/2015 1351   CREATININE 1.0 10/01/2015 1116   CREATININE 1.0 09/09/2015 1351      Component Value Date/Time   CALCIUM 9.0 10/01/2015 1116   CALCIUM 9.1 09/09/2015 1351   ALKPHOS 62 10/01/2015 1116   ALKPHOS 60 09/09/2015 1351   AST 18 10/01/2015 1116   AST 15 09/09/2015 1351   ALT 19 10/01/2015 1116   ALT 28 09/09/2015 1351   BILITOT 0.80 10/01/2015 1116     BILITOT 0.45 09/09/2015 1351         Impression and Plan: Stuart Hill is a 45 year old white male. I think he does have polycythemia. Again, his erythropoietin level is only 1.9.  I think this correlates well with polycythemia.  We will go ahead and phlebotomize him today. I do think that this would be worthwhile.   Again, I'm glad of the Bell's palsy is improving. Looks like it is almost all gone.   Unfortunately, I think with his habits of smoking and taking caffeine, he will be at a high risk for cerebrovascular disease. He cannot take aspirin.  I'll plan to get him back in 6 weeks. I will like to see him back before the holidays. I want to make sure that we optimize his blood over the holidays so he will minimize his risk of any complications.    Volanda Napoleon, MD 10/17/201710:56 AM

## 2015-11-12 NOTE — Patient Instructions (Signed)

## 2015-11-20 ENCOUNTER — Ambulatory Visit: Payer: Self-pay | Admitting: Neurology

## 2015-12-11 ENCOUNTER — Ambulatory Visit: Payer: Self-pay | Admitting: Hematology & Oncology

## 2015-12-11 ENCOUNTER — Other Ambulatory Visit: Payer: Self-pay

## 2015-12-31 ENCOUNTER — Other Ambulatory Visit (HOSPITAL_BASED_OUTPATIENT_CLINIC_OR_DEPARTMENT_OTHER): Payer: Self-pay

## 2015-12-31 ENCOUNTER — Encounter: Payer: Self-pay | Admitting: Oncology

## 2015-12-31 ENCOUNTER — Ambulatory Visit (HOSPITAL_BASED_OUTPATIENT_CLINIC_OR_DEPARTMENT_OTHER): Payer: Self-pay | Admitting: Hematology & Oncology

## 2015-12-31 VITALS — BP 130/81 | HR 64 | Temp 98.0°F | Wt 195.4 lb

## 2015-12-31 DIAGNOSIS — D45 Polycythemia vera: Secondary | ICD-10-CM

## 2015-12-31 LAB — CBC WITH DIFFERENTIAL (CANCER CENTER ONLY)
BASO#: 0.1 10*3/uL (ref 0.0–0.2)
BASO%: 0.9 % (ref 0.0–2.0)
EOS ABS: 0.1 10*3/uL (ref 0.0–0.5)
EOS%: 1.1 % (ref 0.0–7.0)
HEMATOCRIT: 42.6 % (ref 38.7–49.9)
HEMOGLOBIN: 14.4 g/dL (ref 13.0–17.1)
LYMPH#: 2.2 10*3/uL (ref 0.9–3.3)
LYMPH%: 24.5 % (ref 14.0–48.0)
MCH: 29.4 pg (ref 28.0–33.4)
MCHC: 33.8 g/dL (ref 32.0–35.9)
MCV: 87 fL (ref 82–98)
MONO#: 1 10*3/uL — ABNORMAL HIGH (ref 0.1–0.9)
MONO%: 10.9 % (ref 0.0–13.0)
NEUT%: 62.6 % (ref 40.0–80.0)
NEUTROS ABS: 5.6 10*3/uL (ref 1.5–6.5)
Platelets: 284 10*3/uL (ref 145–400)
RBC: 4.89 10*6/uL (ref 4.20–5.70)
RDW: 13.6 % (ref 11.1–15.7)
WBC: 9 10*3/uL (ref 4.0–10.0)

## 2015-12-31 LAB — FERRITIN: Ferritin: 11 ng/ml — ABNORMAL LOW (ref 22–316)

## 2015-12-31 LAB — CMP (CANCER CENTER ONLY)
ALBUMIN: 3.6 g/dL (ref 3.3–5.5)
ALT(SGPT): 20 U/L (ref 10–47)
AST: 26 U/L (ref 11–38)
Alkaline Phosphatase: 57 U/L (ref 26–84)
BUN, Bld: 13 mg/dL (ref 7–22)
CALCIUM: 9.1 mg/dL (ref 8.0–10.3)
CHLORIDE: 101 meq/L (ref 98–108)
CO2: 28 meq/L (ref 18–33)
Creat: 1 mg/dl (ref 0.6–1.2)
Glucose, Bld: 108 mg/dL (ref 73–118)
POTASSIUM: 4.1 meq/L (ref 3.3–4.7)
SODIUM: 143 meq/L (ref 128–145)
Total Bilirubin: 0.6 mg/dl (ref 0.20–1.60)
Total Protein: 7.4 g/dL (ref 6.4–8.1)

## 2015-12-31 LAB — IRON AND TIBC
%SAT: 14 % — AB (ref 20–55)
IRON: 59 ug/dL (ref 42–163)
TIBC: 418 ug/dL — AB (ref 202–409)
UIBC: 359 ug/dL (ref 117–376)

## 2015-12-31 NOTE — Progress Notes (Signed)
Hematology and Oncology Follow Up Visit  Stuart Hill [id number] 1970/03/26 45 y.o. 12/31/2015   Principle Diagnosis:   Polycythemia vera -JAK2 (-)  LEFT Bell's palsy  Current Therapy:   Phlebotomy to maintain hematocrit below 45% Patient is allergic to aspirin-he is taking garlic as a blood thinner  Interim History:  Stuart Hill is back for follow-up. He looks better. He  feels better. He has no problem with depression now. He really is feeling better. I think if at that he is now able to work makes him happy.   Get a very nice Thanksgiving. He is with his family.   He still is smoking a little bit more. He drinks quite a bit of caffeine. He loves coffee.   His mother has moved in with he and his wife. She has had some issues since the death of a son. He is doing a good thing by helping her out and move her in.   We are making him on deficient. Back in October his ferritin was only15 with iron saturation of 9%.   His Bell's palsy is pretty much resolved. He still may have a little bit of neurologic issue and weakness but overall, his eyes pretty much looked similar.   Overall, his performance status is ECOG 1.  iMedications:  Current Outpatient Prescriptions:  .  ALPRAZolam (XANAX) 1 MG tablet, TAKE 1 TABLET BY MOUTH 3 TIMES A DAY AS NEEDED FOR ANXIETY, Disp: 90 tablet, Rfl: 2  Allergies:  Allergies  Allergen Reactions  . Asa [Aspirin] Hives  . Advil [Ibuprofen] Hives  . Afrin [Nasal Spray] Hives  . Antihistamines, Chlorpheniramine-Type Hives  . Atrovent [Ipratropium] Hives  . Bc Powder [Aspirin-Salicylamide-Caffeine] Hives  . Benadryl [Diphenhydramine Hcl] Hives  . Buprenorphine Hcl Hives  . Codeine Hives  . Gastromax [Metoclopramide] Hives  . Morphine And Related Hives  . Oxymetazoline Hives  . [name] [Carisoprodol] Hives  . Tylenol [Acetaminophen] Hives  . Vicodin [Hydrocodone-Acetaminophen] Hives, Swelling and Other (See Comments)    ANGIOEDEMA  .  Pheniramine Hives and Rash    Past Medical History, Surgical history, Social history, and Family History were reviewed and updated.  Review of System:  As above  Physical Exam:  weight is 195 lb 6.4 oz (88.6 kg). His oral temperature is 98 F (36.7 C). His blood pressure is 130/81 and his pulse is 64.   Wt Readings from Last 3 Encounters:  12/31/15 195 lb 6.4 oz (88.6 kg)  11/12/15 191 lb (86.6 kg)  10/01/15 192 lb (87.1 kg)     Head and neck exam shows His left eye to be open. He cannot close the left eye. He has some slight flattening of the left naso- labial fold. He has a good gag reflex. I do not see anything with his ear canals. There is no adenopathy in the neck. The skin folds on the left forehead are slightly flattened. His lungs are clear. Cardiac exam regular rate and rhythm with no murmurs, rubs or bruits. Abdomen is soft. He has good bowel sounds. There is no fluid wave. There is no palpable liver or spleen tip. Back exam shows no tenderness over the spine, ribs or hips. Extremities shows good strength bilaterally. Has good range most of his joints. 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  Lab Results  Component Value Date   WBC 9.0 12/31/2015   HGB 14.4 12/31/2015   HCT 42.6 12/31/2015   MCV 87 12/31/2015   PLT 284  12/31/2015     Chemistry      Component Value Date/Time   NA 143 12/31/2015 1138   NA 139 11/12/2015 0945   K 4.1 12/31/2015 1138   K 3.7 11/12/2015 0945   CL 101 12/31/2015 1138   CO2 28 12/31/2015 1138   CO2 22 11/12/2015 0945   BUN 13 12/31/2015 1138   BUN 13.1 11/12/2015 0945   CREATININE 1.0 12/31/2015 1138   CREATININE 0.9 11/12/2015 0945      Component Value Date/Time   CALCIUM 9.1 12/31/2015 1138   CALCIUM 8.9 11/12/2015 0945   ALKPHOS 57 12/31/2015 1138   ALKPHOS 66 11/12/2015 0945   AST 26 12/31/2015 1138   AST 13 11/12/2015 0945   ALT 20 12/31/2015 1138   ALT 14 11/12/2015 0945   BILITOT 0.60 12/31/2015 1138     BILITOT <0.22 11/12/2015 0945         Impression and Plan: Stuart Hill is a 45 year old white male. I think he does have polycythemia. Again, his erythropoietin level is only 1.9.  I think this correlates well with polycythemia.  He does not need to be phlebotomized today. I'm so happy about this. He just looks better. He feels better.   I think we can get him back now in 2 months. I'm very happy that he is back to work. 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  This is a wonderful way to in the year for him.   Volanda Napoleon, MD 12/5/201712:36 PM

## 2016-01-07 ENCOUNTER — Other Ambulatory Visit: Payer: Self-pay | Admitting: Emergency Medicine

## 2016-01-07 DIAGNOSIS — F411 Generalized anxiety disorder: Secondary | ICD-10-CM

## 2016-01-07 NOTE — Telephone Encounter (Signed)
Approved one month of Xanax; recommend he establish with new provider in office within upcoming month.  Please schedule OV.

## 2016-01-07 NOTE — Telephone Encounter (Signed)
Patient was advised that he will need to come in to establish care with someone comfortable filling these prescriptions.  If one month can be called in for him.   Patient voiced understanding.

## 2016-01-08 NOTE — Telephone Encounter (Signed)
LMOVM per message per Dr. Tamala Julian.  Call office & establish with another provider.

## 2016-02-07 ENCOUNTER — Ambulatory Visit (INDEPENDENT_AMBULATORY_CARE_PROVIDER_SITE_OTHER): Payer: Self-pay | Admitting: Emergency Medicine

## 2016-02-07 DIAGNOSIS — F411 Generalized anxiety disorder: Secondary | ICD-10-CM

## 2016-02-07 MED ORDER — ALPRAZOLAM 1 MG PO TABS: ORAL_TABLET | ORAL | 0 refills | Status: DC

## 2016-02-07 NOTE — Patient Instructions (Signed)
     IF you received an x-ray today, you will receive an invoice from [facility]. Please contact [facility] at [phone] with questions or concerns regarding your invoice.   IF you received labwork today, you will receive an invoice from [facility]. Please contact [facility] at [phone] with questions or concerns regarding your invoice.   Our billing staff will not be able to assist you with questions regarding bills from these companies.  You will be contacted with the lab results as soon as they are available. The fastest way to get your results is to activate your My Chart account. Instructions are located on the last page of this paperwork. If you have not heard from us regarding the results in 2 weeks, please contact this office.     

## 2016-02-07 NOTE — Progress Notes (Signed)
Stuart Hill 46 y.o.   Chief Complaint  Patient presents with  . Establish Care  . Medication Refill    xanax    HISTORY OF PRESENT ILLNESS: This is a 46 y.o. male with h/o GAD requesting Xanax refill, medication he's been taking for 9 years. Has no complaints.  HPI   Prior to Admission medications   Medication Sig Start Date End Date Taking? Authorizing Provider  ALPRAZolam Stuart Hill) 1 MG tablet TAKE 1 TABLET 3 TIMES A DAY AS NEEDED ANXIETY 02/07/16  Yes Stuart Pollen, MD    Allergies  Allergen Reactions  . Asa [Aspirin] Hives  . Advil [Ibuprofen] Hives  . Afrin [Nasal Spray] Hives  . Antihistamines, Chlorpheniramine-Type Hives  . Atrovent [Ipratropium] Hives  . Bc Powder [Aspirin-Salicylamide-Caffeine] Hives  . Benadryl [Diphenhydramine Hcl] Hives  . Buprenorphine Hcl Hives  . Codeine Hives  . Gastromax [Metoclopramide] Hives  . Morphine And Related Hives  . Oxymetazoline Hives  . [name] [Carisoprodol] Hives  . Tylenol [Acetaminophen] Hives  . Vicodin [Hydrocodone-Acetaminophen] Hives, Swelling and Other (See Comments)    ANGIOEDEMA  . Pheniramine Hives and Rash    Patient Active Problem List   Diagnosis Date Noted  . Polycythemia vera ([place]) 05/03/2015  . Hemoptysis 04/03/2015  . Condylomata acuminata 01/18/2015  . Generalized anxiety disorder 03/05/2014    Past Medical History:  Diagnosis Date  . Allergy   . Anxiety   . Arthritis   . Erythrocytosis 04/10/2015  . Hyperlipidemia   . Hypertension   . 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 DENTAL SURGERY    . HAND SURGERY      Social History   Social History  . Marital status: Married    Spouse name: N/A  . Number of children: N/A  . Years of education: N/A   Occupational History  . Not on file.   Social History Main Topics  . Smoking status: Current Every Day Smoker    Packs/day: 1.00    Years: 24.00    Types: Cigarettes  .  Smokeless tobacco: Never Used  . Alcohol use 0.6 oz/week    1 Cans of beer per week     Comment: 6-10 beer a week  . Drug use: Unknown  . Sexual activity: Not on file   Other Topics Concern  . Not on file   Social History Narrative  . No narrative on file    Family History  Problem Relation Age of Onset  . COPD Mother   . Fibromyalgia Mother   . Heart disease Father   . Stroke Father   . Depression Brother   . Heart disease Maternal Grandmother   . Stroke Maternal Grandmother   . Heart disease Maternal Grandfather   . Stroke Maternal Grandfather   . Heart disease Paternal Grandmother   . Stroke Paternal Grandmother   . Heart disease Paternal Grandfather   . Stroke Paternal Grandfather      Review of Systems  Constitutional: Negative.   HENT: Negative.   Eyes: Negative.   Respiratory: Negative.   Cardiovascular: Negative.   Gastrointestinal: Negative.   Genitourinary: Negative.   Musculoskeletal: Negative.   Skin: Negative.   Neurological: Negative.   Endo/Heme/Allergies: Negative.   Psychiatric/Behavioral:       GAD/PTSD  All other systems reviewed and are negative.  Vitals:   02/07/16 0950  BP: 110/78  Pulse: 71  Resp: 18  Temp: 97.6 F (36.4  C)    Physical Exam  Constitutional: He is oriented to person, place, and time. He appears well-developed and well-nourished.  HENT:  Head: Normocephalic and atraumatic.  Eyes: Conjunctivae and EOM are normal. Pupils are equal, round, and reactive to light.  Neck: Normal range of motion. Neck supple.  Cardiovascular: Normal rate and regular rhythm.   Pulmonary/Chest: Effort normal and breath sounds normal.  Abdominal: Soft.  Musculoskeletal: Normal range of motion.  Neurological: He is alert and oriented to person, place, and time.  Skin: Skin is warm and dry.  Psychiatric: He has a normal mood and affect. His behavior is normal.  Vitals reviewed.    ASSESSMENT & PLAN: Stuart Hill was seen today for establish  care and medication refill.  Diagnoses and all orders for this visit:  Generalized anxiety disorder -     ALPRAZolam (XANAX) 1 MG tablet; TAKE 1 TABLET 3 TIMES A DAY AS NEEDED ANXIETY -     Ambulatory referral to Psychiatry   Patient Instructions       IF you received an x-ray today, you will receive an invoice from [facility]. Please contact [facility] at [phone] with questions or concerns regarding your invoice.   IF you received labwork today, you will receive an invoice from [facility]. Please contact [facility] at [phone] with questions or concerns regarding your invoice.   Our billing staff will not be able to assist you with questions regarding bills from these companies.  You will be contacted with the lab results as soon as they are available. The fastest way to get your results is to activate your My Chart account. Instructions are located on the last page of this paperwork. If you have not heard from [place] regarding the results in 2 weeks, please contact this office.         Agustina Caroli, MD [facility]

## 2016-02-11 ENCOUNTER — Telehealth: Payer: Self-pay

## 2016-02-11 NOTE — Telephone Encounter (Signed)
Sent Psych referral to [facility] wq due to pt being without insurance. Office contact [phone].

## 2016-03-04 ENCOUNTER — Ambulatory Visit (HOSPITAL_BASED_OUTPATIENT_CLINIC_OR_DEPARTMENT_OTHER): Payer: Self-pay

## 2016-03-04 ENCOUNTER — Ambulatory Visit (HOSPITAL_BASED_OUTPATIENT_CLINIC_OR_DEPARTMENT_OTHER): Payer: Self-pay | Admitting: Hematology & Oncology

## 2016-03-04 ENCOUNTER — Other Ambulatory Visit (HOSPITAL_BASED_OUTPATIENT_CLINIC_OR_DEPARTMENT_OTHER): Payer: Self-pay

## 2016-03-04 VITALS — BP 110/71 | HR 66 | Temp 98.1°F | Resp 20

## 2016-03-04 VITALS — BP 122/75 | HR 74 | Temp 98.2°F | Resp 20 | Wt 201.0 lb

## 2016-03-04 DIAGNOSIS — R300 Dysuria: Secondary | ICD-10-CM

## 2016-03-04 DIAGNOSIS — D45 Polycythemia vera: Secondary | ICD-10-CM

## 2016-03-04 LAB — CBC WITH DIFFERENTIAL (CANCER CENTER ONLY)
BASO#: 0 10*3/uL (ref 0.0–0.2)
BASO%: 0.4 % (ref 0.0–2.0)
EOS%: 1.3 % (ref 0.0–7.0)
Eosinophils Absolute: 0.1 10*3/uL (ref 0.0–0.5)
HCT: 46.1 % (ref 38.7–49.9)
HGB: 15.8 g/dL (ref 13.0–17.1)
LYMPH#: 2.4 10*3/uL (ref 0.9–3.3)
LYMPH%: 22.1 % (ref 14.0–48.0)
MCH: 30.1 pg (ref 28.0–33.4)
MCHC: 34.3 g/dL (ref 32.0–35.9)
MCV: 88 fL (ref 82–98)
MONO#: 0.8 10*3/uL (ref 0.1–0.9)
MONO%: 7.7 % (ref 0.0–13.0)
NEUT#: 7.3 10*3/uL — ABNORMAL HIGH (ref 1.5–6.5)
NEUT%: 68.5 % (ref 40.0–80.0)
PLATELETS: 260 10*3/uL (ref 145–400)
RBC: 5.25 10*6/uL (ref 4.20–5.70)
RDW: 15.4 % (ref 11.1–15.7)
WBC: 10.7 10*3/uL — AB (ref 4.0–10.0)

## 2016-03-04 LAB — URINALYSIS, MICROSCOPIC (CHCC SATELLITE)
BACTERIA UA: NEGATIVE
Bilirubin (Urine): NEGATIVE
Blood: NEGATIVE
EPITHELIAL CELLS: NONE SEEN
GLUCOSE UR: NEGATIVE mg/dL
Ketones: NEGATIVE mg/dL
Leukocyte Esterase: NEGATIVE
NITRITE: NEGATIVE
Protein: NEGATIVE mg/dL
RBC: NEGATIVE (ref 0–?)
Specific Gravity, Urine: 1.015 (ref 1.003–1.035)
UROBILINOGEN UR: 0.2 mg/dL (ref 0.2–1)
pH: 6 (ref 4.60–8.00)

## 2016-03-04 NOTE — Progress Notes (Signed)
Hematology and Oncology Follow Up Visit  Stuart Hill [id number] 12-09-1970 46 y.o. 03/04/2016   Principle Diagnosis:   Polycythemia vera -JAK2 (-)  LEFT Bell's palsy  Current Therapy:   Phlebotomy to maintain hematocrit below 45% Patient is allergic to aspirin-he is taking garlic as a blood thinner  Interim History:  [name] is back for follow-up. He had a very nice holiday season. He has been busy with his job. He is a Development worker, international aid.  He's had no problems with infections. He's had no problems with fever.  He is complaining of some pain in the left flank. I will check a urinalysis on him. If this is clean, then we will do a renal ultrasound making sure that there is nothing going on with his kidney.   He's had no cough. He still smokes about a pack per day.   He's had no rashes. He's had no change in bowel or bladder habits.   Overall, his performance status is ECOG 1.  iMedications:  Current Outpatient Prescriptions:  .  ALPRAZolam (XANAX) 1 MG tablet, TAKE 1 TABLET 3 TIMES A DAY AS NEEDED ANXIETY, Disp: 90 tablet, Rfl: 0  Allergies:  Allergies  Allergen Reactions  . Asa [Aspirin] Hives  . Advil [Ibuprofen] Hives  . Afrin [Nasal Spray] Hives  . Antihistamines, Chlorpheniramine-Type Hives  . Atrovent [Ipratropium] Hives  . Bc Powder [Aspirin-Salicylamide-Caffeine] Hives  . Benadryl [Diphenhydramine Hcl] Hives  . Buprenorphine Hcl Hives  . Codeine Hives  . Gastromax [Metoclopramide] Hives  . Morphine And Related Hives  . Oxymetazoline Hives  . [name] [Carisoprodol] Hives  . Tylenol [Acetaminophen] Hives  . Vicodin [Hydrocodone-Acetaminophen] Hives, Swelling and Other (See Comments)    ANGIOEDEMA  . Pheniramine Hives and Rash    Past Medical History, Surgical history, Social history, and Family History were reviewed and updated.  Review of System:  As above  Physical Exam:  weight is 201 lb (91.2 kg). His oral temperature is 98.2 F (36.8 C). His blood  pressure is 122/75 and his pulse is 74. His respiration is 20.   Wt Readings from Last 3 Encounters:  03/04/16 201 lb (91.2 kg)  02/07/16 201 lb (91.2 kg)  12/31/15 195 lb 6.4 oz (88.6 kg)     Head and neck exam shows His left eye to be open. He cannot close the left eye. He has some slight flattening of the left naso- labial fold. He has a good gag reflex. I do not see anything with his ear canals. There is no adenopathy in the neck. The skin folds on the left forehead are slightly flattened. His lungs are clear. Cardiac exam regular rate and rhythm with no murmurs, rubs or bruits. Abdomen is soft. He has good bowel sounds. There is no fluid wave. There is no palpable liver or spleen tip. Back exam shows no tenderness over the spine, ribs or hips. Extremities shows good strength bilaterally. Has good range most of his joints. 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  Lab Results  Component Value Date   WBC 10.7 (H) 03/04/2016   HGB 15.8 03/04/2016   HCT 46.1 03/04/2016   MCV 88 03/04/2016   PLT 260 03/04/2016     Chemistry      Component Value Date/Time   NA 143 12/31/2015 1138   NA 139 11/12/2015 0945   K 4.1 12/31/2015 1138   K 3.7 11/12/2015 0945   CL 101 12/31/2015 1138   CO2 28 12/31/2015 1138  CO2 22 11/12/2015 0945   BUN 13 12/31/2015 1138   BUN 13.1 11/12/2015 0945   CREATININE 1.0 12/31/2015 1138   CREATININE 0.9 11/12/2015 0945      Component Value Date/Time   CALCIUM 9.1 12/31/2015 1138   CALCIUM 8.9 11/12/2015 0945   ALKPHOS 57 12/31/2015 1138   ALKPHOS 66 11/12/2015 0945   AST 26 12/31/2015 1138   AST 13 11/12/2015 0945   ALT 20 12/31/2015 1138   ALT 14 11/12/2015 0945   BILITOT 0.60 12/31/2015 1138   BILITOT <0.22 11/12/2015 0945         Impression and Plan: [name] is a 46 year old white male. I think he does have polycythemia. Again, his erythropoietin level is only 1.9.  I think this correlates well with  polycythemia.  He does need to be phlebotomized today. I think we are doing a good job with this.   I think we by get him back in 3 months.   I hope that this we a quiet year for him. I know he had a tough time last year with the Bell's palsy.  Volanda Napoleon, MD 2/7/20182:49 PM

## 2016-03-04 NOTE — Progress Notes (Signed)
Stuart Hill presents today for phlebotomy per MD orders. Phlebotomy procedure started at 1320 and ended at 1325. 525 mg grams removed via 16G in RAC Patient observed for 15 minutes after procedure without any incident per his request to not stay for 30 mins. Patient tolerated procedure well.

## 2016-03-05 LAB — IRON AND TIBC
%SAT: 25 % (ref 20–55)
Iron: 101 ug/dL (ref 42–163)
TIBC: 399 ug/dL (ref 202–409)
UIBC: 298 ug/dL (ref 117–376)

## 2016-03-05 LAB — FERRITIN: Ferritin: 19 ng/ml — ABNORMAL LOW (ref 22–316)

## 2016-03-09 ENCOUNTER — Other Ambulatory Visit: Payer: Self-pay | Admitting: Family

## 2016-03-09 DIAGNOSIS — D45 Polycythemia vera: Secondary | ICD-10-CM

## 2016-03-09 DIAGNOSIS — R109 Unspecified abdominal pain: Secondary | ICD-10-CM

## 2016-03-09 DIAGNOSIS — R911 Solitary pulmonary nodule: Secondary | ICD-10-CM

## 2016-03-09 DIAGNOSIS — F17200 Nicotine dependence, unspecified, uncomplicated: Secondary | ICD-10-CM

## 2016-03-09 NOTE — Addendum Note (Signed)
Addended by: Burney Gauze R on: 03/09/2016 10:27 AM   Modules accepted: Orders

## 2016-03-18 ENCOUNTER — Other Ambulatory Visit: Payer: Self-pay | Admitting: Emergency Medicine

## 2016-03-18 DIAGNOSIS — F411 Generalized anxiety disorder: Secondary | ICD-10-CM

## 2016-03-18 NOTE — Telephone Encounter (Signed)
Patient advised. Philis Fendt, MS, PA-C 3:13 PM, 03/18/2016

## 2016-03-18 NOTE — Telephone Encounter (Signed)
See above

## 2016-03-18 NOTE — Telephone Encounter (Signed)
Was referred to Psych for chronic care. Needs to follow up with them.

## 2016-03-18 NOTE — Telephone Encounter (Signed)
Patient advised, he states he is going to see dr copeland

## 2016-03-18 NOTE — Telephone Encounter (Signed)
Please see medication refill request

## 2016-03-18 NOTE — Telephone Encounter (Signed)
Noted.  This was discussed at his last office visit. Referral is placed.  No further refills from PCP. Philis Fendt, MS, PA-C 3:46 PM, 03/18/2016

## 2016-03-18 NOTE — Telephone Encounter (Signed)
PATIENT STATES HE WAS TALKING TO MICHAEL CLARK AND THE CALL GOT DROPPED. HE DOES NOT UNDERSTAND WHY HE NEEDS TO SEE A PSYCH DR.? HE SAID HE HAS BEEN GETTING XANAX FOR 9 YEARS WITH DR. DAUB AND HE HAS NEVER HAD A PROBLEM. "WHY FIX SOMETHING THAT IS NOT BROKE?" HE WOULD LIKE TO GET A CALL BACK. BEST PHONE [phone] (CELL) [place]

## 2016-03-20 ENCOUNTER — Ambulatory Visit
Admission: RE | Admit: 2016-03-20 | Discharge: 2016-03-20 | Disposition: A | Discharge status: Home / Self Care | Payer: Self-pay | Source: Ambulatory Visit | Attending: Family | Admitting: Family

## 2016-03-20 DIAGNOSIS — F17200 Nicotine dependence, unspecified, uncomplicated: Secondary | ICD-10-CM

## 2016-03-20 DIAGNOSIS — D45 Polycythemia vera: Secondary | ICD-10-CM

## 2016-03-20 DIAGNOSIS — R911 Solitary pulmonary nodule: Secondary | ICD-10-CM

## 2016-03-20 DIAGNOSIS — R109 Unspecified abdominal pain: Secondary | ICD-10-CM

## 2016-03-23 ENCOUNTER — Telehealth: Payer: Self-pay | Admitting: *Deleted

## 2016-03-23 NOTE — Telephone Encounter (Addendum)
Patient aware of results  ----- Message from Volanda Napoleon, MD sent at 03/21/2016  9:00 AM EST ----- Call - the CT scan is stable!!  Nothing looks like cancer!!  looks like some changes from smoking.  Dierdre Searles, MD  P Onc Nurse [facility]        Call - kidneys look normal by sonogram!!! pete

## 2016-04-28 ENCOUNTER — Telehealth: Payer: Self-pay

## 2016-04-28 NOTE — Telephone Encounter (Signed)
Received VM from pt questioning who he should contact regarding the left lower back/flank pain he has been experiencing x 3 months. Pt states "I know Dr Marin Olp said the ultrasound came back OK, but didn't know what I should do now, since the pain is still there."  Per Dr Marin Olp, pt to contact PCP since any hem/onc cause for this pain has been ruled out. This information left on pt's personalized VM. dph

## 2016-06-03 ENCOUNTER — Ambulatory Visit (HOSPITAL_BASED_OUTPATIENT_CLINIC_OR_DEPARTMENT_OTHER): Payer: Self-pay

## 2016-06-03 ENCOUNTER — Ambulatory Visit (HOSPITAL_BASED_OUTPATIENT_CLINIC_OR_DEPARTMENT_OTHER): Payer: Self-pay | Admitting: Hematology & Oncology

## 2016-06-03 ENCOUNTER — Other Ambulatory Visit: Payer: Self-pay

## 2016-06-03 VITALS — BP 131/75 | HR 58 | Resp 16

## 2016-06-03 VITALS — BP 129/80 | HR 59 | Temp 98.2°F | Resp 16 | Wt 201.0 lb

## 2016-06-03 DIAGNOSIS — M5441 Lumbago with sciatica, right side: Secondary | ICD-10-CM

## 2016-06-03 DIAGNOSIS — R300 Dysuria: Secondary | ICD-10-CM

## 2016-06-03 DIAGNOSIS — Z720 Tobacco use: Secondary | ICD-10-CM

## 2016-06-03 DIAGNOSIS — D45 Polycythemia vera: Secondary | ICD-10-CM

## 2016-06-03 LAB — CBC WITH DIFFERENTIAL (CANCER CENTER ONLY)
BASO#: 0.1 10*3/uL (ref 0.0–0.2)
BASO%: 0.5 % (ref 0.0–2.0)
EOS ABS: 0.1 10*3/uL (ref 0.0–0.5)
EOS%: 1.2 % (ref 0.0–7.0)
HEMATOCRIT: 46.8 % (ref 38.7–49.9)
HEMOGLOBIN: 16.3 g/dL (ref 13.0–17.1)
LYMPH#: 2.4 10*3/uL (ref 0.9–3.3)
LYMPH%: 21.4 % (ref 14.0–48.0)
MCH: 31.2 pg (ref 28.0–33.4)
MCHC: 34.8 g/dL (ref 32.0–35.9)
MCV: 90 fL (ref 82–98)
MONO#: 0.9 10*3/uL (ref 0.1–0.9)
MONO%: 7.7 % (ref 0.0–13.0)
NEUT%: 69.2 % (ref 40.0–80.0)
NEUTROS ABS: 7.7 10*3/uL — AB (ref 1.5–6.5)
Platelets: 239 10*3/uL (ref 145–400)
RBC: 5.23 10*6/uL (ref 4.20–5.70)
RDW: 14 % (ref 11.1–15.7)
WBC: 11.1 10*3/uL — ABNORMAL HIGH (ref 4.0–10.0)

## 2016-06-03 NOTE — Progress Notes (Signed)
Stuart Hill presents today for phlebotomy per MD orders. Phlebotomy procedure started at 1459 and ended at 1515. 500 grams removed. Patient observed for 30 minutes after procedure without any incident. Patient tolerated procedure well.

## 2016-06-03 NOTE — Progress Notes (Signed)
Hematology and Oncology Follow Up Visit  Stuart Hill [id number] 11/02/1970 46 y.o. 06/03/2016   Principle Diagnosis:   Polycythemia vera -JAK2 (-)  LEFT Bell's palsy  Current Therapy:   Phlebotomy to maintain hematocrit below 45% Patient is allergic to aspirin-he is taking garlic as a blood thinner  Interim History:  Stuart Hill is back for follow-up. He had a very nice holiday season. He has been busy with his job. He is a Development worker, international aid.  He's had no problems with infections. He's had no problems with fever.  He is still complaining of some pain in the left flank. We did a workup on him. We had a urinalysis. This was negative. We did an ultrasound of his abdomen. This was done in late February. The ultrasound looked okay.  We will go ahead and see about doing an MRI. He is really worried about having cancer. Again I told him that I thought this probably would be degenerative type changes.  He's had no cough. He still smokes about a pack per day.   He is quite busy at work. 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 He is incredibly busy. He really enjoys being busy.  His Bell's palsy really is not a problem any longer.  He's had no rashes. He's had no change in bowel or bladder habits.   Overall, his performance status is ECOG 1.  iMedications:  Current Outpatient Prescriptions:  .  ALPRAZolam (XANAX) 1 MG tablet, TAKE 1 TABLET 3 TIMES A DAY AS NEEDED ANXIETY, Disp: 90 tablet, Rfl: 0  Allergies:  Allergies  Allergen Reactions  . Asa [Aspirin] Hives  . Advil [Ibuprofen] Hives  . Afrin [Nasal Spray] Hives  . Antihistamines, Chlorpheniramine-Type Hives  . Atrovent [Ipratropium] Hives  . Bc Powder [Aspirin-Salicylamide-Caffeine] Hives  . Benadryl [Diphenhydramine Hcl] Hives  . Buprenorphine Hcl Hives  . Codeine Hives  . Gastromax [Metoclopramide] Hives  . Morphine And Related Hives  . Oxymetazoline Hives  . [name] [Carisoprodol] Hives  . Tylenol [Acetaminophen] Hives  .  Vicodin [Hydrocodone-Acetaminophen] Hives, Swelling and Other (See Comments)    ANGIOEDEMA  . Pheniramine Hives and Rash    Past Medical History, Surgical history, Social history, and Family History were reviewed and updated.  Review of System:  As above  Physical Exam:  weight is 201 lb (91.2 kg). His oral temperature is 98.2 F (36.8 C). His blood pressure is 129/80 and his pulse is 59 (abnormal). His respiration is 16 and oxygen saturation is 96%.   Wt Readings from Last 3 Encounters:  06/03/16 201 lb (91.2 kg)  03/04/16 201 lb (91.2 kg)  02/07/16 201 lb (91.2 kg)     Head and neck exam shows His left eye to be open. He cannot close the left eye. He has some slight flattening of the left naso- labial fold. He has a good gag reflex. I do not see anything with his ear canals. There is no adenopathy in the neck. The skin folds on the left forehead are slightly flattened. His lungs are clear. Cardiac exam regular rate and rhythm with no murmurs, rubs or bruits. Abdomen is soft. He has good bowel sounds. There is no fluid wave. There is no palpable liver or spleen tip. Back exam shows no tenderness over the spine, ribs or hips. Extremities shows good strength bilaterally. Has good range most of his joints. 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  Lab Results  Component Value Date   WBC 11.1 (H)  06/03/2016   HGB 16.3 06/03/2016   HCT 46.8 06/03/2016   MCV 90 06/03/2016   PLT 239 06/03/2016     Chemistry      Component Value Date/Time   NA 143 12/31/2015 1138   NA 139 11/12/2015 0945   K 4.1 12/31/2015 1138   K 3.7 11/12/2015 0945   CL 101 12/31/2015 1138   CO2 28 12/31/2015 1138   CO2 22 11/12/2015 0945   BUN 13 12/31/2015 1138   BUN 13.1 11/12/2015 0945   CREATININE 1.0 12/31/2015 1138   CREATININE 0.9 11/12/2015 0945      Component Value Date/Time   CALCIUM 9.1 12/31/2015 1138   CALCIUM 8.9 11/12/2015 0945   ALKPHOS 57 12/31/2015 1138   ALKPHOS 66  11/12/2015 0945   AST 26 12/31/2015 1138   AST 13 11/12/2015 0945   ALT 20 12/31/2015 1138   ALT 14 11/12/2015 0945   BILITOT 0.60 12/31/2015 1138   BILITOT <0.22 11/12/2015 0945         Impression and Plan: Stuart Hill is a 46 year old white male. I think he does have polycythemia. Again, his erythropoietin level is only 1.9.  I think this correlates well with polycythemia.  He is still smoking quite a bit. I know this is going to be problem for him.  He has stopped drinking carbonated beverages. He still drinks a lot of coffee.  We will order a MRI of his lower back. I'm not sure why he still is having this flank pain over on the left side. I still think this is probably referred from his back.  He does need to be phlebotomized today. I think we are doing a good job with this.   I will have him come back in another 3 months.  Volanda Napoleon, MD 5/9/20182:49 PM

## 2016-06-03 NOTE — Patient Instructions (Signed)
     Therapeutic Phlebotomy, Care After Refer to this sheet in the next few weeks. These instructions provide you with information about caring for yourself after your procedure. Your health care provider may also give you more specific instructions. Your treatment has been planned according to current medical practices, but problems sometimes occur. Call your health care provider if you have any problems or questions after your procedure. What can I expect after the procedure? After the procedure, it is common to have:  Light-headedness or dizziness. You may feel faint.  Nausea.  Tiredness. Follow these instructions at home: Activity  Return to your normal activities as directed by your health care provider. Most people can go back to their normal activities right away.  Avoid strenuous physical activity and heavy lifting or pulling for about 5 hours after the procedure. Do not lift anything that is heavier than 10 lb (4.5 kg).  Athletes should avoid strenuous exercise for at least 12 hours.  Change positions slowly for the remainder of the day. This will help to prevent light-headedness or fainting.  If you feel light-headed, lie down until the feeling goes away. Eating and drinking  Be sure to eat well-balanced meals for the next 24 hours.  Drink enough fluid to keep your urine clear or pale yellow.  Avoid drinking alcohol on the day that you had the procedure. Care of the Needle Insertion Site  Keep your bandage dry. You can remove the bandage after about 5 hours or as directed by your health care provider.  If you have bleeding from the needle insertion site, elevate your arm and press firmly on the site until the bleeding stops.  If you have bruising at the site, apply ice to the area:  Put ice in a plastic bag.  Place a towel between your skin and the bag.  Leave the ice on for 20 minutes, 2-3 times a day for the first 24 hours.  If the swelling does not go away  after 24 hours, apply a warm, moist washcloth to the area for 20 minutes, 2-3 times a day. General instructions  Avoid smoking for at least 30 minutes after the procedure.  Keep all follow-up visits as directed by your health care provider. It is important to continue with further therapeutic phlebotomy treatments as directed. Contact a health care provider if:  You have redness, swelling, or pain at the needle insertion site.  You have fluid, blood, or pus coming from the needle insertion site.  You feel light-headed, dizzy, or nauseated, and the feeling does not go away.  You notice new bruising at the needle insertion site.  You feel weaker than normal.  You have a fever or chills. Get help right away if:  You have severe nausea or vomiting.  You have chest pain.  You have trouble breathing. This information is not intended to replace advice given to you by your health care provider. Make sure you discuss any questions you have with your health care provider. Document Released: 06/16/2010 Document Revised: 09/14/2015 Document Reviewed: 01/08/2014 Elsevier Interactive Patient Education  2017 [employer]  

## 2016-06-04 LAB — FERRITIN: FERRITIN: 17 ng/mL — AB (ref 22–316)

## 2016-06-04 LAB — IRON AND TIBC
%SAT: 28 % (ref 20–55)
IRON: 108 ug/dL (ref 42–163)
TIBC: 383 ug/dL (ref 202–409)
UIBC: 275 ug/dL (ref 117–376)

## 2016-06-30 ENCOUNTER — Encounter: Payer: Self-pay | Admitting: Behavioral Health

## 2016-06-30 ENCOUNTER — Telehealth: Payer: Self-pay | Admitting: Behavioral Health

## 2016-06-30 NOTE — Progress Notes (Addendum)
[place] at [facility] [address], [place], [place] [address] [phone]  Date:  07/01/2016   Name:  Stuart Hill   DOB:  [birth year]   MRN:  [id number]  PCP:  Darreld Mclean, MD    Chief Complaint: Establish Care (Pt here to est care. c/o left side flank pain x 8 months. Need refill today. )   History of Present Illness:  Stuart Hill is a 46 y.o. very pleasant male patient who presents with the following:  Here today as a new patient to my practice at [facility], although I know this pt well from [facility] He has a history of anxiety and polycythemia  He needs a refill of xanax today which he uses for anxiety  NCCSR:  He got xanax 1mg  on 1/12 and 12/13- #90 both fills.  Since then he has run out of his own medication and admits to borrowing some from family members  He works in Biomedical scientist.  Accompanied today by his wife [name]  He did have bells palsy last August; this was scary for him and he thought that he might have had a stroke  He is not quite back to normal with his left face muscular function yet, but nearly recovered He did have an MRI in 8/17 which was normal He did see neurology- Dr. Leonie Man, due to severity of Bell's palsy.  All appears to be ok from a neuro standpoint  He is seeing Dr. Marin Olp for his polycythemia vera and has done some therapeutic phlebotomy He is not able to tolerate aspirin- it causes hives  He has noted a left sided thoracic back pain for about 8 months.   He did have a renal US which was normal No plain films of his spine as of yet Dr. Marin Olp did schedule an MRI of his lumbar spine, but he would like to have this [facility] due to cost difference.  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 Polycythemia vera ([place]) 05/03/2015  . Hemoptysis 04/03/2015  . Condylomata acuminata 01/18/2015  . Generalized anxiety disorder 03/05/2014    Past Medical History:  Diagnosis Date  . Allergy   . Anxiety   . Arthritis   . Bell's palsy   . Erythrocytosis 04/10/2015  . Hyperlipidemia   . Hypertension   . Polycythemia vera ([place]) 05/03/2015    Past Surgical History:  Procedure Laterality Date  . DENTAL SURGERY    . HAND SURGERY      Social History  Substance Use Topics  . Smoking status: Current Every Day Smoker    Packs/day: 1.00    Years: 24.00    Types: Cigarettes  . Smokeless tobacco: Never Used  . Alcohol use 0.6 oz/week    1 Cans of beer per week     Comment: 6-10 beer a week    Family History  Problem Relation Age of Onset  . COPD Mother   . Fibromyalgia Mother   . Heart disease Father   . Stroke Father   . Depression Brother   . Heart disease Brother   . Heart disease Maternal Grandmother   . Stroke Maternal Grandmother   .  Heart disease Maternal Grandfather   . Stroke Maternal Grandfather   . Heart disease Paternal Grandmother   . Stroke Paternal Grandmother   . Heart disease Paternal Grandfather   . Stroke Paternal Grandfather     Allergies  Allergen Reactions  . Asa [Aspirin] Hives  . Advil [Ibuprofen] Hives  . Afrin [Nasal Spray] Hives  . Antihistamines, Chlorpheniramine-Type Hives  . Atrovent [Ipratropium] Hives  . Bc Powder [Aspirin-Salicylamide-Caffeine] Hives  . Benadryl [Diphenhydramine Hcl] Hives  . Buprenorphine Hcl Hives  . Codeine Hives  . Gastromax [Metoclopramide] Hives  . Morphine And Related Hives  . Oxymetazoline Hives  . [name] [Carisoprodol] Hives  . Tylenol [Acetaminophen] Hives  . Vicodin [Hydrocodone-Acetaminophen] Hives, Swelling and Other (See Comments)    ANGIOEDEMA  . Pheniramine Hives and Rash    Medication list has been reviewed and updated.  Current Outpatient Prescriptions on File  Prior to Visit  Medication Sig Dispense Refill  . ALPRAZolam (XANAX) 1 MG tablet TAKE 1 TABLET 3 TIMES A DAY AS NEEDED ANXIETY 90 tablet 0   No current facility-administered medications on file prior to visit.     Review of Systems:  As per HPI- otherwise negative.   Physical Examination: Vitals:   07/01/16 1212  BP: 132/82  Pulse: 72  Temp: 97.8 F (36.6 C)   Vitals:   07/01/16 1212  Weight: 204 lb 9.6 oz (92.8 kg)  Height: 6\' 3"  (1.905 m)   Body mass index is 25.57 kg/m. Ideal Body Weight: Weight in (lb) to have BMI = 25: 199.6  GEN: WDWN, NAD, Non-toxic, A & O x 3, looks well, normal weight HEENT: Atraumatic, Normocephalic. Neck supple. No masses, No LAD. Ears and Nose: No external deformity. CV: RRR, No M/G/R. No JVD. No thrill. No extra heart sounds. PULM: CTA B, no wheezes, crackles, rhonchi. No retractions. No resp. distress. No accessory muscle use. ABD: S, NT, ND, +BS. No rebound. No HSM. EXTR: No c/c/e NEURO Normal gait.  PSYCH: Normally interactive. Conversant. Not depressed or anxious appearing.  Calm demeanor.  He notes the left thoracic paraspinous muscles as the area of concern No redness, lesion or swelling I cannot reproduce pain with palpation   Assessment and Plan: Chronic left-sided low back pain without sciatica - Plan: DG Lumbar Spine Complete  Generalized anxiety disorder - Plan: ALPRAZolam (XANAX) 1 MG tablet  Back stiffness - Plan: DG Lumbar Spine Complete, DG Cervical Spine 2 or 3 views, CANCELED: DG Thoracic Spine 2 View  Here today to re-establish care and go over a few concerns Ordered plain films of his spine as above Refilled xanax for his anxiety Follow-up pending radiology reports Will then decide about MRI  Dg Thoracic Spine W/swimmers  Result Date: 07/01/2016 CLINICAL DATA:  thoracoLumbar area spine pain when ROM is L or R / gym and landscape work / no other trauma / jdh 315 EXAM: THORACIC SPINE - 3 VIEWS COMPARISON:  CT  03/20/2016 FINDINGS: There is no evidence of thoracic spine fracture. Alignment is normal. Usual anterior endplate spurring in the mid thoracic spine as before. No other significant bone abnormalities are identified. IMPRESSION: Negative. Electronically Signed   By: Lucrezia Europe M.D.   On: 07/01/2016 16:00   Dg Lumbar Spine Complete  Result Date: 07/01/2016 CLINICAL DATA:  C/o LBP x 2 months / no trauma / gym and landscape yard work / jdh 315 EXAM: LUMBAR SPINE - COMPLETE 4+ VIEW COMPARISON:  CT 04/27/2015 FINDINGS: There is no evidence of lumbar  spine fracture. Alignment is normal. Small anterior endplate spurs at all lumbar levels. No pars defect. Intervertebral disc spaces are maintained. IMPRESSION: Negative. Electronically Signed   By: Lucrezia Europe M.D.   On: 07/01/2016 15:59    Called him to discuss   Signed Lamar Blinks, MD

## 2016-06-30 NOTE — Telephone Encounter (Signed)
Pre-Visit Call completed with patient and chart updated.   Pre-Visit Info documented in Specialty Comments under SnapShot.    

## 2016-07-01 ENCOUNTER — Ambulatory Visit (INDEPENDENT_AMBULATORY_CARE_PROVIDER_SITE_OTHER): Payer: Self-pay | Admitting: Family Medicine

## 2016-07-01 ENCOUNTER — Ambulatory Visit
Admission: RE | Admit: 2016-07-01 | Discharge: 2016-07-01 | Disposition: A | Discharge status: Home / Self Care | Payer: No Typology Code available for payment source | Source: Ambulatory Visit | Attending: Family Medicine | Admitting: Family Medicine

## 2016-07-01 ENCOUNTER — Other Ambulatory Visit: Payer: Self-pay | Admitting: Family Medicine

## 2016-07-01 VITALS — BP 132/82 | HR 72 | Temp 97.8°F | Ht 75.0 in | Wt 204.6 lb

## 2016-07-01 DIAGNOSIS — G8929 Other chronic pain: Secondary | ICD-10-CM

## 2016-07-01 DIAGNOSIS — F411 Generalized anxiety disorder: Secondary | ICD-10-CM

## 2016-07-01 DIAGNOSIS — M2560 Stiffness of unspecified joint, not elsewhere classified: Secondary | ICD-10-CM

## 2016-07-01 DIAGNOSIS — M545 Low back pain, unspecified: Secondary | ICD-10-CM

## 2016-07-01 DIAGNOSIS — M2569 Stiffness of other specified joint, not elsewhere classified: Secondary | ICD-10-CM

## 2016-07-01 MED ORDER — ALPRAZOLAM 1 MG PO TABS: ORAL_TABLET | ORAL | 0 refills | Status: DC

## 2016-07-01 NOTE — Patient Instructions (Signed)
It was very nice to see you guys again!  Take care and I will watch for your x-ray results I ordered films of your lower back, mid back and neck which can be done at [place] at your convenienve  [phone]  I'll be in touch with your results and we can plan the next step

## 2016-08-03 ENCOUNTER — Other Ambulatory Visit: Payer: Self-pay | Admitting: Family Medicine

## 2016-08-03 DIAGNOSIS — F411 Generalized anxiety disorder: Secondary | ICD-10-CM

## 2016-08-03 NOTE — Telephone Encounter (Signed)
Requesting: ALPRAZolam (XANAX) 1 MG tablet Contract UDS Last OV: 07/01/16 Last Refill: 07/01/16  Please Advise

## 2016-08-03 NOTE — Telephone Encounter (Signed)
Seen by myself about a month ago [place]: he got a 30 day supply of xanax from me on 6/6, no concerning entries Will refill today

## 2016-08-21 ENCOUNTER — Other Ambulatory Visit: Payer: Self-pay | Admitting: Family

## 2016-08-21 DIAGNOSIS — D45 Polycythemia vera: Secondary | ICD-10-CM

## 2016-08-21 DIAGNOSIS — M545 Low back pain, unspecified: Secondary | ICD-10-CM

## 2016-08-26 ENCOUNTER — Other Ambulatory Visit (HOSPITAL_BASED_OUTPATIENT_CLINIC_OR_DEPARTMENT_OTHER): Payer: Self-pay

## 2016-08-26 ENCOUNTER — Ambulatory Visit (HOSPITAL_BASED_OUTPATIENT_CLINIC_OR_DEPARTMENT_OTHER): Payer: Self-pay | Admitting: Hematology & Oncology

## 2016-08-26 ENCOUNTER — Ambulatory Visit (HOSPITAL_BASED_OUTPATIENT_CLINIC_OR_DEPARTMENT_OTHER): Payer: Self-pay

## 2016-08-26 VITALS — BP 133/85 | HR 68 | Temp 97.9°F | Resp 16 | Wt 204.0 lb

## 2016-08-26 DIAGNOSIS — D45 Polycythemia vera: Secondary | ICD-10-CM

## 2016-08-26 DIAGNOSIS — M5441 Lumbago with sciatica, right side: Secondary | ICD-10-CM

## 2016-08-26 DIAGNOSIS — G510 Bell's palsy: Secondary | ICD-10-CM

## 2016-08-26 DIAGNOSIS — R200 Anesthesia of skin: Secondary | ICD-10-CM

## 2016-08-26 DIAGNOSIS — G459 Transient cerebral ischemic attack, unspecified: Secondary | ICD-10-CM

## 2016-08-26 DIAGNOSIS — F17210 Nicotine dependence, cigarettes, uncomplicated: Secondary | ICD-10-CM

## 2016-08-26 DIAGNOSIS — I749 Embolism and thrombosis of unspecified artery: Principal | ICD-10-CM

## 2016-08-26 LAB — CMP (CANCER CENTER ONLY)
ALT(SGPT): 33 U/L (ref 10–47)
AST: 28 U/L (ref 11–38)
Albumin: 3.7 g/dL (ref 3.3–5.5)
Alkaline Phosphatase: 57 U/L (ref 26–84)
BILIRUBIN TOTAL: 0.7 mg/dL (ref 0.20–1.60)
BUN, Bld: 12 mg/dL (ref 7–22)
CO2: 30 mEq/L (ref 18–33)
CREATININE: 1.2 mg/dL (ref 0.6–1.2)
Calcium: 9.4 mg/dL (ref 8.0–10.3)
Chloride: 104 mEq/L (ref 98–108)
GLUCOSE: 96 mg/dL (ref 73–118)
Potassium: 4.7 mEq/L (ref 3.3–4.7)
SODIUM: 140 meq/L (ref 128–145)
Total Protein: 7.5 g/dL (ref 6.4–8.1)

## 2016-08-26 LAB — CBC WITH DIFFERENTIAL (CANCER CENTER ONLY)
BASO#: 0 10*3/uL (ref 0.0–0.2)
BASO%: 0.4 % (ref 0.0–2.0)
EOS%: 1.3 % (ref 0.0–7.0)
Eosinophils Absolute: 0.1 10*3/uL (ref 0.0–0.5)
HCT: 47.7 % (ref 38.7–49.9)
HEMOGLOBIN: 16.7 g/dL (ref 13.0–17.1)
LYMPH#: 2.4 10*3/uL (ref 0.9–3.3)
LYMPH%: 22.5 % (ref 14.0–48.0)
MCH: 31.4 pg (ref 28.0–33.4)
MCHC: 35 g/dL (ref 32.0–35.9)
MCV: 90 fL (ref 82–98)
MONO#: 0.9 10*3/uL (ref 0.1–0.9)
MONO%: 8.3 % (ref 0.0–13.0)
NEUT#: 7.3 10*3/uL — ABNORMAL HIGH (ref 1.5–6.5)
NEUT%: 67.5 % (ref 40.0–80.0)
PLATELETS: 218 10*3/uL (ref 145–400)
RBC: 5.32 10*6/uL (ref 4.20–5.70)
RDW: 12.9 % (ref 11.1–15.7)
WBC: 10.8 10*3/uL — AB (ref 4.0–10.0)

## 2016-08-26 MED ORDER — CLOPIDOGREL BISULFATE 75 MG PO TABS: 75.0000 mg | ORAL_TABLET | Freq: Every day | ORAL | 12 refills | Status: DC

## 2016-08-26 NOTE — Patient Instructions (Signed)
Therapeutic Phlebotomy Therapeutic phlebotomy is the controlled removal of blood from a person's body for the purpose of treating a medical condition. The procedure is similar to donating blood. Usually, about a pint (470 mL, or 0.47L) of blood is removed. The average adult has 9-12 pints (4.3-5.7 L) of blood. Therapeutic phlebotomy may be used to treat the following medical conditions:  Hemochromatosis. This is a condition in which the blood contains too much iron.  Polycythemia vera. This is a condition in which the blood contains too many red blood cells.  Porphyria cutanea tarda. This is a disease in which an important part of hemoglobin is not made properly. It results in the buildup of abnormal amounts of porphyrins in the body.  Sickle cell disease. This is a condition in which the red blood cells form an abnormal crescent shape rather than a round shape.  Tell a health care provider about:  Any allergies you have.  All medicines you are taking, including vitamins, herbs, eye drops, creams, and over-the-counter medicines.  Any problems you or family members have had with anesthetic medicines.  Any blood disorders you have.  Any surgeries you have had.  Any medical conditions you have. What are the risks? Generally, this is a safe procedure. However, problems may occur, including:  Nausea or light-headedness.  Low blood pressure.  Soreness, bleeding, swelling, or bruising at the needle insertion site.  Infection.  What happens before the procedure?  Follow instructions from your health care provider about eating or drinking restrictions.  Ask your health care provider about changing or stopping your regular medicines. This is especially important if you are taking diabetes medicines or blood thinners.  Wear clothing with sleeves that can be raised above the elbow.  Plan to have someone take you home after the procedure.  You may have a blood sample taken. What  happens during the procedure?  A needle will be inserted into one of your veins.  Tubing and a collection bag will be attached to that needle.  Blood will flow through the needle and tubing into the collection bag.  You may be asked to open and close your hand slowly and continually during the entire collection.  After the specified amount of blood has been removed from your body, the collection bag and tubing will be clamped.  The needle will be removed from your vein.  Pressure will be held on the site of the needle insertion to stop the bleeding.  A bandage (dressing) will be placed over the needle insertion site. The procedure may vary among health care providers and hospitals. What happens after the procedure?  Your recovery will be assessed and monitored.  You can return to your normal activities as directed by your health care provider. This information is not intended to replace advice given to you by your health care provider. Make sure you discuss any questions you have with your health care provider. Document Released: 06/16/2010 Document Revised: 09/14/2015 Document Reviewed: 01/08/2014 Elsevier Interactive Patient Education  2018 Elsevier Inc.  

## 2016-08-26 NOTE — Progress Notes (Addendum)
Hematology and Oncology Follow Up Visit  Stuart Hill [id number] Dec 19, 1970 46 y.o. 08/26/2016   Principle Diagnosis:   Polycythemia vera -JAK2 (-)  LEFT Bell's palsy  Current Therapy:   Phlebotomy to maintain hematocrit below 42% Patient is allergic to aspirin-he is taking garlic as a blood thinner Plavix 75 mg po q day  Interim History:  Stuart Hill is back for follow-up. He has been quite busy. He is with his landscaping business.  He has been having some symptoms with his face. He's had some numbness on the left side. He has had a history of Bell's palsy.  He just has not felt as well. He's had more headaches. He's felt a little bit more sluggish.  He is still smoking. He is trying to cut back.    He is clearly iron deficient. His ferritin is 17 with iron saturation of 28%.  Overall, his performance status is ECOG 1.  iMedications:  Current Outpatient Prescriptions:  .  ALPRAZolam (XANAX) 1 MG tablet, TAKE 1 TABLET BY MOUTH THREE TIMES A DAY AS NEEDED FOR ANXIETY, Disp: 90 tablet, Rfl: 2 .  clopidogrel (PLAVIX) 75 MG tablet, Take 1 tablet (75 mg total) by mouth daily., Disp: 30 tablet, Rfl: 12  Allergies:  Allergies  Allergen Reactions  . Asa [Aspirin] Hives  . Advil [Ibuprofen] Hives  . Afrin [Nasal Spray] Hives  . Antihistamines, Chlorpheniramine-Type Hives  . Atrovent [Ipratropium] Hives  . Bc Powder [Aspirin-Salicylamide-Caffeine] Hives  . Benadryl [Diphenhydramine Hcl] Hives  . Buprenorphine Hcl Hives  . Codeine Hives  . Gastromax [Metoclopramide] Hives  . Morphine And Related Hives  . Oxymetazoline Hives  . [name] [Carisoprodol] Hives  . Tylenol [Acetaminophen] Hives  . Vicodin [Hydrocodone-Acetaminophen] Hives, Swelling and Other (See Comments)    ANGIOEDEMA  . Pheniramine Hives and Rash    Past Medical History, Surgical history, Social history, and Family History were reviewed and updated.  Review of System:  As above  Physical Exam:  weight  is 204 lb (92.5 kg). His oral temperature is 97.9 F (36.6 C). His blood pressure is 133/85 and his pulse is 68. His respiration is 16 and oxygen saturation is 100%.   Wt Readings from Last 3 Encounters:  08/26/16 204 lb (92.5 kg)  07/01/16 204 lb 9.6 oz (92.8 kg)  06/03/16 201 lb (91.2 kg)     Head and neck exam shows His left eye to be open. He cannot close the left eye. He has some slight flattening of the left naso- labial fold. He has a good gag reflex. I do not see anything with his ear canals. There is no adenopathy in the neck. The skin folds on the left forehead are slightly flattened. His lungs are clear. Cardiac exam regular rate and rhythm with no murmurs, rubs or bruits. Abdomen is soft. He has good bowel sounds. There is no fluid wave. There is no palpable liver or spleen tip. Back exam shows no tenderness over the spine, ribs or hips. Extremities shows good strength bilaterally. Has good range most of his joints. 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  Lab Results  Component Value Date   WBC 10.8 (H) 08/26/2016   HGB 16.7 08/26/2016   HCT 47.7 08/26/2016   MCV 90 08/26/2016   PLT 218 08/26/2016     Chemistry      Component Value Date/Time   NA 140 08/26/2016 1356   NA 139 11/12/2015 0945   K 4.7 08/26/2016 1356  K 3.7 11/12/2015 0945   CL 104 08/26/2016 1356   CO2 30 08/26/2016 1356   CO2 22 11/12/2015 0945   BUN 12 08/26/2016 1356   BUN 13.1 11/12/2015 0945   CREATININE 1.2 08/26/2016 1356   CREATININE 0.9 11/12/2015 0945      Component Value Date/Time   CALCIUM 9.4 08/26/2016 1356   CALCIUM 8.9 11/12/2015 0945   ALKPHOS 57 08/26/2016 1356   ALKPHOS 66 11/12/2015 0945   AST 28 08/26/2016 1356   AST 13 11/12/2015 0945   ALT 33 08/26/2016 1356   ALT 14 11/12/2015 0945   BILITOT 0.70 08/26/2016 1356   BILITOT <0.22 11/12/2015 0945         Impression and Plan: [name] is a 46 year old white male. I think he does have  polycythemia. Again, his erythropoietin level is only 1.9.  I think this correlates well with polycythemia.  He is still smoking quite a bit.  I'm worried about this symptom that he had. It also sounds like she may have had a TIA.  He cannot take aspirin. As such, I will time on some Plavix. I think this would be reasonable. I think that he does have a higher risk of cerebrovascular disease because of his smoking and the polycythemia.  I talked to he and his wife about this. They are in agreement.  He will be phlebotomized today. I want him back in one week for another phlebotomy.  I really think that we are going to have to be aggressive in keeping his hematocrit down below 45%. We may actually have to think about decreasing the hematocrit to a level of 42%.  I will have him come back in another 4 weeks.   8/1/20184:10 PM

## 2016-08-26 NOTE — Progress Notes (Signed)
Stuart Hill presents today for phlebotomy per MD orders. Phlebotomy procedure started at 1530 and ended at 1540. 502  grams removed. Patient observed for 30 minutes after procedure without any incident. Patient tolerated procedure well. IV needle removed intact.

## 2016-08-27 LAB — IRON AND TIBC
%SAT: 23 % (ref 20–55)
IRON: 82 ug/dL (ref 42–163)
TIBC: 350 ug/dL (ref 202–409)
UIBC: 268 ug/dL (ref 117–376)

## 2016-08-27 LAB — FERRITIN: Ferritin: 39 ng/ml (ref 22–316)

## 2016-08-28 ENCOUNTER — Other Ambulatory Visit: Payer: Self-pay

## 2016-08-28 ENCOUNTER — Ambulatory Visit: Payer: Self-pay | Admitting: Hematology & Oncology

## 2016-09-02 ENCOUNTER — Other Ambulatory Visit: Payer: Self-pay

## 2016-09-03 ENCOUNTER — Ambulatory Visit (HOSPITAL_BASED_OUTPATIENT_CLINIC_OR_DEPARTMENT_OTHER): Payer: Self-pay

## 2016-09-03 ENCOUNTER — Other Ambulatory Visit (HOSPITAL_BASED_OUTPATIENT_CLINIC_OR_DEPARTMENT_OTHER): Payer: Self-pay

## 2016-09-03 DIAGNOSIS — D45 Polycythemia vera: Secondary | ICD-10-CM

## 2016-09-03 DIAGNOSIS — G459 Transient cerebral ischemic attack, unspecified: Secondary | ICD-10-CM

## 2016-09-03 DIAGNOSIS — I749 Embolism and thrombosis of unspecified artery: Principal | ICD-10-CM

## 2016-09-03 LAB — CBC WITH DIFFERENTIAL (CANCER CENTER ONLY)
BASO#: 0.1 10*3/uL (ref 0.0–0.2)
BASO%: 0.5 % (ref 0.0–2.0)
EOS%: 2.2 % (ref 0.0–7.0)
Eosinophils Absolute: 0.2 10*3/uL (ref 0.0–0.5)
HEMATOCRIT: 43.1 % (ref 38.7–49.9)
HEMOGLOBIN: 15.1 g/dL (ref 13.0–17.1)
LYMPH#: 2.7 10*3/uL (ref 0.9–3.3)
LYMPH%: 24.3 % (ref 14.0–48.0)
MCH: 31.6 pg (ref 28.0–33.4)
MCHC: 35 g/dL (ref 32.0–35.9)
MCV: 90 fL (ref 82–98)
MONO#: 0.8 10*3/uL (ref 0.1–0.9)
MONO%: 7.4 % (ref 0.0–13.0)
NEUT%: 65.6 % (ref 40.0–80.0)
NEUTROS ABS: 7.2 10*3/uL — AB (ref 1.5–6.5)
Platelets: 256 10*3/uL (ref 145–400)
RBC: 4.78 10*6/uL (ref 4.20–5.70)
RDW: 12.6 % (ref 11.1–15.7)
WBC: 11 10*3/uL — ABNORMAL HIGH (ref 4.0–10.0)

## 2016-09-23 ENCOUNTER — Other Ambulatory Visit (HOSPITAL_BASED_OUTPATIENT_CLINIC_OR_DEPARTMENT_OTHER): Payer: Self-pay

## 2016-09-23 ENCOUNTER — Ambulatory Visit (HOSPITAL_BASED_OUTPATIENT_CLINIC_OR_DEPARTMENT_OTHER): Payer: Self-pay

## 2016-09-23 ENCOUNTER — Ambulatory Visit (HOSPITAL_BASED_OUTPATIENT_CLINIC_OR_DEPARTMENT_OTHER): Payer: Self-pay | Admitting: Family

## 2016-09-23 VITALS — BP 122/75 | HR 81 | Temp 98.2°F | Resp 18 | Wt 200.0 lb

## 2016-09-23 VITALS — BP 106/76 | HR 94 | Resp 17

## 2016-09-23 DIAGNOSIS — D45 Polycythemia vera: Secondary | ICD-10-CM

## 2016-09-23 DIAGNOSIS — D500 Iron deficiency anemia secondary to blood loss (chronic): Secondary | ICD-10-CM

## 2016-09-23 DIAGNOSIS — Z862 Personal history of diseases of the blood and blood-forming organs and certain disorders involving the immune mechanism: Secondary | ICD-10-CM

## 2016-09-23 DIAGNOSIS — D751 Secondary polycythemia: Secondary | ICD-10-CM

## 2016-09-23 DIAGNOSIS — G459 Transient cerebral ischemic attack, unspecified: Secondary | ICD-10-CM

## 2016-09-23 DIAGNOSIS — F17218 Nicotine dependence, cigarettes, with other nicotine-induced disorders: Secondary | ICD-10-CM

## 2016-09-23 DIAGNOSIS — I749 Embolism and thrombosis of unspecified artery: Principal | ICD-10-CM

## 2016-09-23 LAB — CBC WITH DIFFERENTIAL (CANCER CENTER ONLY)
BASO#: 0 10*3/uL (ref 0.0–0.2)
BASO%: 0.4 % (ref 0.0–2.0)
EOS ABS: 0.1 10*3/uL (ref 0.0–0.5)
EOS%: 1.4 % (ref 0.0–7.0)
HEMATOCRIT: 44.3 % (ref 38.7–49.9)
HEMOGLOBIN: 15.5 g/dL (ref 13.0–17.1)
LYMPH#: 2.3 10*3/uL (ref 0.9–3.3)
LYMPH%: 24.1 % (ref 14.0–48.0)
MCH: 32 pg (ref 28.0–33.4)
MCHC: 35 g/dL (ref 32.0–35.9)
MCV: 91 fL (ref 82–98)
MONO#: 0.6 10*3/uL (ref 0.1–0.9)
MONO%: 6.7 % (ref 0.0–13.0)
NEUT%: 67.4 % (ref 40.0–80.0)
NEUTROS ABS: 6.3 10*3/uL (ref 1.5–6.5)
Platelets: 277 10*3/uL (ref 145–400)
RBC: 4.85 10*6/uL (ref 4.20–5.70)
RDW: 13.4 % (ref 11.1–15.7)
WBC: 9.4 10*3/uL (ref 4.0–10.0)

## 2016-09-23 LAB — CMP (CANCER CENTER ONLY)
ALK PHOS: 67 U/L (ref 26–84)
ALT: 28 U/L (ref 10–47)
AST: 31 U/L (ref 11–38)
Albumin: 4 g/dL (ref 3.3–5.5)
BILIRUBIN TOTAL: 0.8 mg/dL (ref 0.20–1.60)
BUN: 13 mg/dL (ref 7–22)
CALCIUM: 9.4 mg/dL (ref 8.0–10.3)
CO2: 30 mEq/L (ref 18–33)
CREATININE: 1.1 mg/dL (ref 0.6–1.2)
Chloride: 102 mEq/L (ref 98–108)
GLUCOSE: 105 mg/dL (ref 73–118)
Potassium: 3.9 mEq/L (ref 3.3–4.7)
Sodium: 144 mEq/L (ref 128–145)
Total Protein: 8 g/dL (ref 6.4–8.1)

## 2016-09-23 NOTE — Progress Notes (Signed)
Hematology and Oncology Follow Up Visit  KHALED HERDA [id number] 15-Dec-1970 46 y.o. 09/23/2016   Principle Diagnosis:  Polycythemia vera -JAK2 (-) LEFT Bell's palsy  Current Therapy:   Phlebotomy to maintain hematocrit below 42% Patient is allergic to aspirin-he is taking garlic as a blood thinner Plavix 75 mg po q day   Interim History:  Mr. Barajas is here today with his wife for follow-up. He is still smoking heavily and drinking lots of caffeine (2 pots of coffee daily, espresso and Monster coffee drinks). We discussed how this will effect his blood and vasoconstriction putting him at higher risk of developing a clot or having a cardiac event.  He also verbalized understands that testosterone injections and "D-ball" (he has discussed this with people in the gym) are both not safe for him to use.  He verbalized that he is taking his Plavix as prescribed. No episodes of bleeding, bruising or petechiae.  He is still having occasional headaches and tingling in the left side of his face. He has no issue with speech at this time and denies any problems swallowing.  He is complaining of pain in his back, knees and shoulders. He would like to see an orthopedist, Dr. Mardelle Matte.  He denies fever, chills, n/v, cough, rash, dizziness, SOB, chest pain, palpitations, abdominal pain or changes in bowel or bladder habits.  No swelling, numbness or tingling in his extremities.  He has maintained a good appetite and states that he needs to drink more fluids.   ECOG Performance Status: 1 - Symptomatic but completely ambulatory  Medications:  Allergies as of 09/23/2016      Reactions   Asa [aspirin] Hives   Advil [ibuprofen] Hives   Afrin [nasal Spray] Hives   Antihistamines, Chlorpheniramine-type Hives   Atrovent [ipratropium] Hives   Bc Powder [aspirin-salicylamide-caffeine] Hives   Benadryl [diphenhydramine Hcl] Hives   Buprenorphine Hcl Hives   Codeine Hives   Gastromax [metoclopramide]  Hives   Morphine And Related Hives   Oxymetazoline Hives   [name] [carisoprodol] Hives   Tylenol [acetaminophen] Hives   Vicodin [hydrocodone-acetaminophen] Hives, Swelling, Other (See Comments)   ANGIOEDEMA   Pheniramine Hives, Rash      Medication List       Accurate as of 09/23/16  2:46 PM. Always use your most recent med list.          ALPRAZolam 1 MG tablet Commonly known as:  XANAX TAKE 1 TABLET BY MOUTH THREE TIMES A DAY AS NEEDED FOR ANXIETY   clopidogrel 75 MG tablet Commonly known as:  PLAVIX Take 1 tablet (75 mg total) by mouth daily.       Allergies:  Allergies  Allergen Reactions  . Asa [Aspirin] Hives  . Advil [Ibuprofen] Hives  . Afrin [Nasal Spray] Hives  . Antihistamines, Chlorpheniramine-Type Hives  . Atrovent [Ipratropium] Hives  . Bc Powder [Aspirin-Salicylamide-Caffeine] Hives  . Benadryl [Diphenhydramine Hcl] Hives  . Buprenorphine Hcl Hives  . Codeine Hives  . Gastromax [Metoclopramide] Hives  . Morphine And Related Hives  . Oxymetazoline Hives  . [name] [Carisoprodol] Hives  . Tylenol [Acetaminophen] Hives  . Vicodin [Hydrocodone-Acetaminophen] Hives, Swelling and Other (See Comments)    ANGIOEDEMA  . Pheniramine Hives and Rash    Past Medical History, Surgical history, Social history, and Family History were reviewed and updated.  Review of Systems: All other 10 point review of systems is negative.   Physical Exam:  weight is 200 lb (90.7 kg). His oral temperature is 98.2  F (36.8 C). His blood pressure is 122/75 and his pulse is 81. His respiration is 18 and oxygen saturation is 97%.   Wt Readings from Last 3 Encounters:  09/23/16 200 lb (90.7 kg)  08/26/16 204 lb (92.5 kg)  07/01/16 204 lb 9.6 oz (92.8 kg)    Ocular: Sclerae unicteric, pupils equal, round and reactive to light Ear-nose-throat: Oropharynx clear, dentition fair Lymphatic: No cervical, supraclavicular or axillary adenopathy Lungs no rales or rhonchi, good  excursion bilaterally Heart regular rate and rhythm, no murmur appreciated Abd soft, nontender, positive bowel sounds, no liver or spleen tip palpated on exam, no fluid wave  MSK no focal spinal tenderness, no joint edema Neuro: non-focal, well-oriented, appropriate affect Breasts: Deferred   Lab Results  Component Value Date   WBC 9.4 09/23/2016   HGB 15.5 09/23/2016   HCT 44.3 09/23/2016   MCV 91 09/23/2016   PLT 277 09/23/2016   Lab Results  Component Value Date   FERRITIN 39 08/26/2016   IRON 82 08/26/2016   TIBC 350 08/26/2016   UIBC 268 08/26/2016   IRONPCTSAT 23 08/26/2016   Lab Results  Component Value Date   RBC 4.85 09/23/2016   No results found for: KPAFRELGTCHN, LAMBDASER, KAPLAMBRATIO No results found for: IGGSERUM, IGA, IGMSERUM No results found for: Odetta Pink, SPEI   Chemistry      Component Value Date/Time   NA 144 09/23/2016 1339   NA 139 11/12/2015 0945   K 3.9 09/23/2016 1339   K 3.7 11/12/2015 0945   CL 102 09/23/2016 1339   CO2 30 09/23/2016 1339   CO2 22 11/12/2015 0945   BUN 13 09/23/2016 1339   BUN 13.1 11/12/2015 0945   CREATININE 1.1 09/23/2016 1339   CREATININE 0.9 11/12/2015 0945      Component Value Date/Time   CALCIUM 9.4 09/23/2016 1339   CALCIUM 8.9 11/12/2015 0945   ALKPHOS 67 09/23/2016 1339   ALKPHOS 66 11/12/2015 0945   AST 31 09/23/2016 1339   AST 13 11/12/2015 0945   ALT 28 09/23/2016 1339   ALT 14 11/12/2015 0945   BILITOT 0.80 09/23/2016 1339   BILITOT <0.22 11/12/2015 0945      Impression and Plan: Mr. Geister is a pleasant 46 yo caucasian gentleman with polycythemia - JAK2 negative. He smokes heavily and drinks an excessive amount of caffeine each day. We discussed this and he will attempt to cut back on both.  His Hct today is 44.3 so we will proceed with phlebotomy today as planned.  We will plan to see him back again in another 6 weeks for repeat lab  work and follow-up.  He will contact our office with any questions or concerns. We can certainly see him sooner if need be.    Eliezer Bottom, NP 8/29/20182:46 PM

## 2016-09-23 NOTE — Progress Notes (Signed)
Stuart Hill presents today for phlebotomy per MD orders. Phlebotomy procedure started at 1528 and ended at 1534 via 16G to [facility] with  520 grams removed. Diet and nutrition offered. Patient observed for 30 minutes after procedure without any incident. Patient tolerated procedure well.

## 2016-09-23 NOTE — Patient Instructions (Signed)
     Therapeutic Phlebotomy, Care After Refer to this sheet in the next few weeks. These instructions provide you with information about caring for yourself after your procedure. Your health care provider may also give you more specific instructions. Your treatment has been planned according to current medical practices, but problems sometimes occur. Call your health care provider if you have any problems or questions after your procedure. What can I expect after the procedure? After the procedure, it is common to have:  Light-headedness or dizziness. You may feel faint.  Nausea.  Tiredness. Follow these instructions at home: Activity  Return to your normal activities as directed by your health care provider. Most people can go back to their normal activities right away.  Avoid strenuous physical activity and heavy lifting or pulling for about 5 hours after the procedure. Do not lift anything that is heavier than 10 lb (4.5 kg).  Athletes should avoid strenuous exercise for at least 12 hours.  Change positions slowly for the remainder of the day. This will help to prevent light-headedness or fainting.  If you feel light-headed, lie down until the feeling goes away. Eating and drinking  Be sure to eat well-balanced meals for the next 24 hours.  Drink enough fluid to keep your urine clear or pale yellow.  Avoid drinking alcohol on the day that you had the procedure. Care of the Needle Insertion Site  Keep your bandage dry. You can remove the bandage after about 5 hours or as directed by your health care provider.  If you have bleeding from the needle insertion site, elevate your arm and press firmly on the site until the bleeding stops.  If you have bruising at the site, apply ice to the area:  Put ice in a plastic bag.  Place a towel between your skin and the bag.  Leave the ice on for 20 minutes, 2-3 times a day for the first 24 hours.  If the swelling does not go away  after 24 hours, apply a warm, moist washcloth to the area for 20 minutes, 2-3 times a day. General instructions  Avoid smoking for at least 30 minutes after the procedure.  Keep all follow-up visits as directed by your health care provider. It is important to continue with further therapeutic phlebotomy treatments as directed. Contact a health care provider if:  You have redness, swelling, or pain at the needle insertion site.  You have fluid, blood, or pus coming from the needle insertion site.  You feel light-headed, dizzy, or nauseated, and the feeling does not go away.  You notice new bruising at the needle insertion site.  You feel weaker than normal.  You have a fever or chills. Get help right away if:  You have severe nausea or vomiting.  You have chest pain.  You have trouble breathing. This information is not intended to replace advice given to you by your health care provider. Make sure you discuss any questions you have with your health care provider. Document Released: 06/16/2010 Document Revised: 09/14/2015 Document Reviewed: 01/08/2014 Elsevier Interactive Patient Education  2017 [employer]  

## 2016-09-24 ENCOUNTER — Ambulatory Visit
Admission: RE | Admit: 2016-09-24 | Discharge: 2016-09-24 | Disposition: A | Discharge status: Home / Self Care | Payer: No Typology Code available for payment source | Source: Ambulatory Visit | Attending: Family | Admitting: Family

## 2016-09-24 ENCOUNTER — Other Ambulatory Visit: Payer: Self-pay | Admitting: Family

## 2016-09-24 DIAGNOSIS — M545 Low back pain, unspecified: Secondary | ICD-10-CM

## 2016-09-24 DIAGNOSIS — D45 Polycythemia vera: Secondary | ICD-10-CM

## 2016-09-24 LAB — FERRITIN: Ferritin: 17 ng/ml — ABNORMAL LOW (ref 22–316)

## 2016-09-24 LAB — IRON AND TIBC
%SAT: 19 % — ABNORMAL LOW (ref 20–55)
Iron: 71 ug/dL (ref 42–163)
TIBC: 365 ug/dL (ref 202–409)
UIBC: 295 ug/dL (ref 117–376)

## 2016-09-29 ENCOUNTER — Telehealth: Payer: Self-pay | Admitting: *Deleted

## 2016-09-29 ENCOUNTER — Other Ambulatory Visit: Payer: Self-pay | Admitting: Family

## 2016-09-29 DIAGNOSIS — D751 Secondary polycythemia: Secondary | ICD-10-CM | POA: Insufficient documentation

## 2016-09-29 NOTE — Telephone Encounter (Addendum)
Patient is aware of results  ----- Message from Eliezer Bottom, NP sent at 09/29/2016 10:05 AM EDT ----- Regarding: MRI Negative. He needs to follow-up with the orthopedist he mentioned. I sent a message last week to his PCP suggesting a referral.   Sarah  ----- Message ----- From: Buel Ream, Rad Results In Sent: 09/24/2016  11:21 AM To: Eliezer Bottom, NP

## 2016-10-19 ENCOUNTER — Telehealth: Payer: Self-pay | Admitting: *Deleted

## 2016-10-19 NOTE — Telephone Encounter (Signed)
Patient's wife calling because patient continues to have back pain. Today at work he had an episode where he felt an "electrical current' run through his back and legs for about 3 seconds. He has recently been assessed at [facility] for back pain. He has called their office, but not yet heard back.  Spoke to Dr Marin Olp. Patient needs to continue to follow up with Raliegh Ip or PCP as these symptoms are unrelated to his polycythemia vera.   Wife understands instructions and with follow up with other physicians.

## 2016-10-20 ENCOUNTER — Ambulatory Visit: Payer: No Typology Code available for payment source | Admitting: Family Medicine

## 2016-10-23 ENCOUNTER — Other Ambulatory Visit: Payer: Self-pay | Admitting: *Deleted

## 2016-10-23 ENCOUNTER — Other Ambulatory Visit (HOSPITAL_BASED_OUTPATIENT_CLINIC_OR_DEPARTMENT_OTHER): Payer: Self-pay

## 2016-10-23 DIAGNOSIS — D500 Iron deficiency anemia secondary to blood loss (chronic): Secondary | ICD-10-CM

## 2016-10-23 DIAGNOSIS — D45 Polycythemia vera: Secondary | ICD-10-CM

## 2016-10-23 LAB — CMP (CANCER CENTER ONLY)
ALT(SGPT): 25 U/L (ref 10–47)
AST: 27 U/L (ref 11–38)
Albumin: 3.9 g/dL (ref 3.3–5.5)
Alkaline Phosphatase: 56 U/L (ref 26–84)
BILIRUBIN TOTAL: 0.8 mg/dL (ref 0.20–1.60)
BUN: 10 mg/dL (ref 7–22)
CALCIUM: 9.4 mg/dL (ref 8.0–10.3)
CO2: 29 meq/L (ref 18–33)
Chloride: 104 mEq/L (ref 98–108)
Creat: 0.8 mg/dl (ref 0.6–1.2)
GLUCOSE: 98 mg/dL (ref 73–118)
Potassium: 4.3 mEq/L (ref 3.3–4.7)
Sodium: 137 mEq/L (ref 128–145)
Total Protein: 7.4 g/dL (ref 6.4–8.1)

## 2016-10-23 LAB — CBC WITH DIFFERENTIAL (CANCER CENTER ONLY)
BASO#: 0 10*3/uL (ref 0.0–0.2)
BASO%: 0.3 % (ref 0.0–2.0)
EOS ABS: 0.2 10*3/uL (ref 0.0–0.5)
EOS%: 1.3 % (ref 0.0–7.0)
HCT: 43.4 % (ref 38.7–49.9)
HGB: 15.3 g/dL (ref 13.0–17.1)
LYMPH#: 2.3 10*3/uL (ref 0.9–3.3)
LYMPH%: 20.1 % (ref 14.0–48.0)
MCH: 31.7 pg (ref 28.0–33.4)
MCHC: 35.3 g/dL (ref 32.0–35.9)
MCV: 90 fL (ref 82–98)
MONO#: 0.8 10*3/uL (ref 0.1–0.9)
MONO%: 6.9 % (ref 0.0–13.0)
NEUT#: 8.3 10*3/uL — ABNORMAL HIGH (ref 1.5–6.5)
NEUT%: 71.4 % (ref 40.0–80.0)
PLATELETS: 296 10*3/uL (ref 145–400)
RBC: 4.82 10*6/uL (ref 4.20–5.70)
RDW: 13 % (ref 11.1–15.7)
WBC: 11.6 10*3/uL — ABNORMAL HIGH (ref 4.0–10.0)

## 2016-10-26 ENCOUNTER — Ambulatory Visit (HOSPITAL_BASED_OUTPATIENT_CLINIC_OR_DEPARTMENT_OTHER): Payer: Self-pay

## 2016-10-26 ENCOUNTER — Ambulatory Visit (HOSPITAL_BASED_OUTPATIENT_CLINIC_OR_DEPARTMENT_OTHER): Payer: Self-pay | Admitting: Family

## 2016-10-26 VITALS — BP 119/73 | HR 70 | Temp 97.7°F | Resp 18 | Wt 199.0 lb

## 2016-10-26 VITALS — BP 118/57 | HR 80

## 2016-10-26 DIAGNOSIS — F17218 Nicotine dependence, cigarettes, with other nicotine-induced disorders: Secondary | ICD-10-CM

## 2016-10-26 DIAGNOSIS — D751 Secondary polycythemia: Secondary | ICD-10-CM

## 2016-10-26 LAB — IRON AND TIBC
%SAT: 19 % — AB (ref 20–55)
Iron: 73 ug/dL (ref 42–163)
TIBC: 386 ug/dL (ref 202–409)
UIBC: 313 ug/dL (ref 117–376)

## 2016-10-26 LAB — FERRITIN: Ferritin: 20 ng/ml — ABNORMAL LOW (ref 22–316)

## 2016-10-26 NOTE — Patient Instructions (Signed)
Therapeutic Phlebotomy Therapeutic phlebotomy is the controlled removal of blood from a person's body for the purpose of treating a medical condition. The procedure is similar to donating blood. Usually, about a pint (470 mL, or 0.47L) of blood is removed. The average adult has 9-12 pints (4.3-5.7 L) of blood. Therapeutic phlebotomy may be used to treat the following medical conditions:  Hemochromatosis. This is a condition in which the blood contains too much iron.  Polycythemia vera. This is a condition in which the blood contains too many red blood cells.  Porphyria cutanea tarda. This is a disease in which an important part of hemoglobin is not made properly. It results in the buildup of abnormal amounts of porphyrins in the body.  Sickle cell disease. This is a condition in which the red blood cells form an abnormal crescent shape rather than a round shape.  Tell a health care provider about:  Any allergies you have.  All medicines you are taking, including vitamins, herbs, eye drops, creams, and over-the-counter medicines.  Any problems you or family members have had with anesthetic medicines.  Any blood disorders you have.  Any surgeries you have had.  Any medical conditions you have. What are the risks? Generally, this is a safe procedure. However, problems may occur, including:  Nausea or light-headedness.  Low blood pressure.  Soreness, bleeding, swelling, or bruising at the needle insertion site.  Infection.  What happens before the procedure?  Follow instructions from your health care provider about eating or drinking restrictions.  Ask your health care provider about changing or stopping your regular medicines. This is especially important if you are taking diabetes medicines or blood thinners.  Wear clothing with sleeves that can be raised above the elbow.  Plan to have someone take you home after the procedure.  You may have a blood sample taken. What  happens during the procedure?  A needle will be inserted into one of your veins.  Tubing and a collection bag will be attached to that needle.  Blood will flow through the needle and tubing into the collection bag.  You may be asked to open and close your hand slowly and continually during the entire collection.  After the specified amount of blood has been removed from your body, the collection bag and tubing will be clamped.  The needle will be removed from your vein.  Pressure will be held on the site of the needle insertion to stop the bleeding.  A bandage (dressing) will be placed over the needle insertion site. The procedure may vary among health care providers and hospitals. What happens after the procedure?  Your recovery will be assessed and monitored.  You can return to your normal activities as directed by your health care provider. This information is not intended to replace advice given to you by your health care provider. Make sure you discuss any questions you have with your health care provider. Document Released: 06/16/2010 Document Revised: 09/14/2015 Document Reviewed: 01/08/2014 Elsevier Interactive Patient Education  2018 Elsevier Inc.  

## 2016-10-26 NOTE — Progress Notes (Signed)
Stuart Hill presents today for phlebotomy per MD orders. Phlebotomy procedure started at 1015 and ended at 1025. 502 grams removed. Patient observed for 30 minutes after procedure without any incident. Patient tolerated procedure well. IV needle removed intact.

## 2016-10-26 NOTE — Progress Notes (Signed)
Hematology and Oncology Follow Up Visit  Stuart Hill [id number] 1970-11-21 46 y.o. 10/26/2016   Principle Diagnosis:  Secondary polycythemia JAK2 (-), smoker  LEFT Bell's palsy  Current Therapy:   Phlebotomy to maintain hematocrit below 42% Patient is allergic to aspirin-he is taking garlic as a blood thinner Plavix 75 mg po q day   Interim History:  Stuart Hill is here today with his wife for follow-up and phlebotomy. His Hct was 43.4 so we will phlebotomize him today.  He states that he took himself off of Plavix because he was feeling "foggy" and tired and his body was telling him to get the "F" off of it. He is allergic to aspirin and would like to try taking a garlic supplement and radish smoothies instead. He understands that this will likely not be as effective as the medication in preventing a cardiac event.  He is still smoking. He has cat pack his espresso consumption to 4 cups a day rather than 9.   He had some numbness and tingling in his face with changes in speech last week. He has been advised multiple time to follow-up with neurology. I reminded him again today that he needs to follow-up with their office. He plans to call them today.  No falls or syncopal episodes.  No fever, chills, n/v, cough, rash, dizziness, SOB, chest pain, palpitations, abdominal pain or changes in bowel or bladder habits.  He is seeing a chiropractor for his back problems and is also followed regularly by Raliegh Ip.  No swelling in his extremities.  He has maintained a good appetite and is staying well hydrated now with water. His weight is stable.   ECOG Performance Status: 1 - Symptomatic but completely ambulatory  Medications:  Allergies as of 10/26/2016      Reactions   Asa [aspirin] Hives   Advil [ibuprofen] Hives   Afrin [nasal Spray] Hives   Antihistamines, Chlorpheniramine-type Hives   Atrovent [ipratropium] Hives   Bc Powder [aspirin-salicylamide-caffeine] Hives   Benadryl [diphenhydramine Hcl] Hives   Buprenorphine Hcl Hives   Codeine Hives   Gastromax [metoclopramide] Hives   Morphine And Related Hives   Oxymetazoline Hives   Plavix [clopidogrel Bisulfate] Other (See Comments)   Confusion, lethargy, slurred speech   [name] [carisoprodol] Hives   Tylenol [acetaminophen] Hives   Vicodin [hydrocodone-acetaminophen] Hives, Swelling, Other (See Comments)   ANGIOEDEMA   Pheniramine Hives, Rash      Medication List       Accurate as of 10/26/16 10:11 AM. Always use your most recent med list.          ALPRAZolam 1 MG tablet Commonly known as:  XANAX TAKE 1 TABLET BY MOUTH THREE TIMES A DAY AS NEEDED FOR ANXIETY   clopidogrel 75 MG tablet Commonly known as:  PLAVIX Take 1 tablet (75 mg total) by mouth daily.       Allergies:  Allergies  Allergen Reactions  . Asa [Aspirin] Hives  . Advil [Ibuprofen] Hives  . Afrin [Nasal Spray] Hives  . Antihistamines, Chlorpheniramine-Type Hives  . Atrovent [Ipratropium] Hives  . Bc Powder [Aspirin-Salicylamide-Caffeine] Hives  . Benadryl [Diphenhydramine Hcl] Hives  . Buprenorphine Hcl Hives  . Codeine Hives  . Gastromax [Metoclopramide] Hives  . Morphine And Related Hives  . Oxymetazoline Hives  . Plavix [Clopidogrel Bisulfate] Other (See Comments)    Confusion, lethargy, slurred speech  . [name] [Carisoprodol] Hives  . Tylenol [Acetaminophen] Hives  . Vicodin [Hydrocodone-Acetaminophen] Hives, Swelling and Other (See Comments)  ANGIOEDEMA  . Pheniramine Hives and Rash    Past Medical History, Surgical history, Social history, and Family History were reviewed and updated.  Review of Systems: All other 10 point review of systems is negative.   Physical Exam:  weight is 199 lb (90.3 kg). His oral temperature is 97.7 F (36.5 C). His blood pressure is 119/73 and his pulse is 70. His respiration is 18 and oxygen saturation is 100%.   Wt Readings from Last 3 Encounters:  10/26/16 199 lb  (90.3 kg)  09/23/16 200 lb (90.7 kg)  08/26/16 204 lb (92.5 kg)    Ocular: Sclerae unicteric, pupils equal, round and reactive to light Ear-nose-throat: Oropharynx clear, dentition fair Lymphatic: No cervical, supraclavicular or axillary adenopathy Lungs no rales or rhonchi, good excursion bilaterally Heart regular rate and rhythm, no murmur appreciated Abd soft, nontender, positive bowel sounds, no liver or spleen tip palpated on exam, no fluid wave  MSK no focal spinal tenderness, no joint edema Neuro: non-focal, well-oriented, appropriate affect Breasts: Deferred   Lab Results  Component Value Date   WBC 11.6 (H) 10/23/2016   HGB 15.3 10/23/2016   HCT 43.4 10/23/2016   MCV 90 10/23/2016   PLT 296 10/23/2016   Lab Results  Component Value Date   FERRITIN 20 (L) 10/23/2016   IRON 73 10/23/2016   TIBC 386 10/23/2016   UIBC 313 10/23/2016   IRONPCTSAT 19 (L) 10/23/2016   Lab Results  Component Value Date   RBC 4.82 10/23/2016   No results found for: KPAFRELGTCHN, LAMBDASER, KAPLAMBRATIO No results found for: IGGSERUM, IGA, IGMSERUM No results found for: Odetta Pink, SPEI   Chemistry      Component Value Date/Time   NA 137 10/23/2016 1334   NA 139 11/12/2015 0945   K 4.3 10/23/2016 1334   K 3.7 11/12/2015 0945   CL 104 10/23/2016 1334   CO2 29 10/23/2016 1334   CO2 22 11/12/2015 0945   BUN 10 10/23/2016 1334   BUN 13.1 11/12/2015 0945   CREATININE 0.8 10/23/2016 1334   CREATININE 0.9 11/12/2015 0945      Component Value Date/Time   CALCIUM 9.4 10/23/2016 1334   CALCIUM 8.9 11/12/2015 0945   ALKPHOS 56 10/23/2016 1334   ALKPHOS 66 11/12/2015 0945   AST 27 10/23/2016 1334   AST 13 11/12/2015 0945   ALT 25 10/23/2016 1334   ALT 14 11/12/2015 0945   BILITOT 0.80 10/23/2016 1334   BILITOT <0.22 11/12/2015 0945      Impression and Plan: Stuart Hill is a 46 yo caucasian gentleman with secondary  polycythemia due to smoking (JAK2 -). He is still smoking and drinking espresso daily. Hct today is 43.4 so we will proceed with phlebotomy today as planned.  We will plan to see him back in another month for repeat lab work and follow-up.  He plans to see neurology in th next few weeks.  Both he and his wife know to contact our office with any questions or concerns. We can certainly see him sooner if need be.   Eliezer Bottom, NP 10/1/201810:11 AM

## 2016-10-27 ENCOUNTER — Other Ambulatory Visit: Payer: No Typology Code available for payment source

## 2016-10-27 ENCOUNTER — Ambulatory Visit: Payer: No Typology Code available for payment source | Admitting: Family

## 2016-11-05 ENCOUNTER — Telehealth: Payer: Self-pay | Admitting: Family Medicine

## 2016-11-05 NOTE — Telephone Encounter (Signed)
Teyon Odette Self [phone]  [facility] [address]   ALPRAZolam Duanne Moron) 1 MG tablet   Dallyn called in to say he needs a refill

## 2016-11-06 ENCOUNTER — Other Ambulatory Visit: Payer: Self-pay | Admitting: Family Medicine

## 2016-11-06 DIAGNOSIS — F411 Generalized anxiety disorder: Secondary | ICD-10-CM

## 2016-11-09 ENCOUNTER — Other Ambulatory Visit: Payer: Self-pay | Admitting: Emergency Medicine

## 2016-11-09 DIAGNOSIS — F411 Generalized anxiety disorder: Secondary | ICD-10-CM

## 2016-11-09 MED ORDER — ALPRAZOLAM 1 MG PO TABS: ORAL_TABLET | ORAL | 2 refills | Status: DC

## 2016-11-09 NOTE — Telephone Encounter (Signed)
Requesting: ALPRAZolam (XANAX) 1 MG tablet Contract UDS Last OV: 07/01/16 Last Refill: 08/03/16  Please Advise

## 2016-11-09 NOTE — Telephone Encounter (Signed)
[facility]: last filled alprazolam 90 about a month ago No concerning entries Ok to refill

## 2016-11-10 NOTE — Telephone Encounter (Signed)
[facility] [address] on 11/09/16.

## 2016-11-17 ENCOUNTER — Encounter: Payer: Self-pay | Admitting: Neurology

## 2016-11-17 ENCOUNTER — Ambulatory Visit (INDEPENDENT_AMBULATORY_CARE_PROVIDER_SITE_OTHER): Payer: Self-pay | Admitting: Neurology

## 2016-11-17 VITALS — BP 129/80 | HR 65 | Wt 200.2 lb

## 2016-11-17 DIAGNOSIS — M542 Cervicalgia: Secondary | ICD-10-CM

## 2016-11-17 DIAGNOSIS — I771 Stricture of artery: Secondary | ICD-10-CM

## 2016-11-17 DIAGNOSIS — G510 Bell's palsy: Secondary | ICD-10-CM

## 2016-11-17 NOTE — Progress Notes (Signed)
[facility] [address] [place]. [place] [address] [phone]       OFFICE CONSULT NOTE  Stuart Hill Date of Birth:  Apr 11, 1970 Medical Record Number:  [id number]   Referring MD:  self  Reason for Referral:  Carotid stenosis  HPI: Mr Stuart Hill is a 46 year Caucasian male who is accompanied today by his wife. He has a remote history of left-sided Bell's palsy a year ago for which he had seen me he seems to have improved very well from that. He has occasional right ear symptoms and kidneys see on his left face buthas essentially of pain full muscle improvement. The patient recently went to chiropractor and was concerned about the carotid stenosis and advise him to see me to get carotid ultrasound done. Patient has chronic neck discomfort and pain as well as numbness in the left side of the neck and face. He has habitual neck popping at least 10 times a day. He works in a [employer] and has had chronic back problems and neck problems.He had x-rays of his neck several years ago but has never had MRI scan of the neck. Is at previous MRI scan of the lumbar spine showing degenerative disease and bony spurs. Chest x-ray of the cervical spine done on file in orthopedics the year ago showed C5-6 and C6-7 bony spurs and spondylotic changes. Patient denies any significant radicular pain in the neck but has chronic neck and left shoulder pain thickness. Denies any specific history for history of strokes or TIAs.  ROS:   14 system review of systems is positive for  Blurred vision, numbness, joint pain and back pain, aching muscles, muscle cramps, walking difficulty, neck pain and stiffness and all other systems negative PMH:  Past Medical History:  Diagnosis Date  . Allergy   . Anxiety   . Arthritis   . Bell's palsy   . Erythrocytosis 04/10/2015  . Hyperlipidemia   . Hypertension   . Polycythemia vera ([place]) 05/03/2015    Social History:  Social History   Social  History  . Marital status: Married    Spouse name: N/A  . Number of children: N/A  . Years of education: N/A   Occupational History  . Not on file.   Social History Main Topics  . Smoking status: Current Every Day Smoker    Packs/day: 1.00    Years: 24.00    Types: Cigarettes  . Smokeless tobacco: Never Used  . Alcohol use 0.6 oz/week    1 Cans of beer per week     Comment: 6-10 beer a week  . Drug use: No  . Sexual activity: Not on file   Other Topics Concern  . Not on file   Social History Narrative  . No narrative on file    Medications:   Current Outpatient Prescriptions on File Prior to Visit  Medication Sig Dispense Refill  . ALPRAZolam (XANAX) 1 MG tablet TAKE 1 TABLET BY MOUTH THREE TIMES A DAY AS NEEDED FOR ANXIETY 90 tablet 2   No current facility-administered medications on file prior to visit.     Allergies:   Allergies  Allergen Reactions  . Asa [Aspirin] Hives  . Advil [Ibuprofen] Hives  . Afrin [Nasal Spray] Hives  . Antihistamines, Chlorpheniramine-Type Hives  . Atrovent [Ipratropium] Hives  . Bc Powder [Aspirin-Salicylamide-Caffeine] Hives  . Benadryl [Diphenhydramine Hcl] Hives  . Buprenorphine Hcl Hives  . Codeine Hives  . Gastromax [Metoclopramide] Hives  . Morphine  And Related Hives  . Oxymetazoline Hives  . Plavix [Clopidogrel Bisulfate] Other (See Comments)    Confusion, lethargy, slurred speech  . [name] [Carisoprodol] Hives  . Tylenol [Acetaminophen] Hives  . Vicodin [Hydrocodone-Acetaminophen] Hives, Swelling and Other (See Comments)    ANGIOEDEMA  . Pheniramine Hives and Rash    Physical Exam General: well developed, well nourished, seated, in no evident distress Head: head normocephalic and atraumatic.   Neck: supple with no carotid or supraclavicular bruits Cardiovascular: regular rate and rhythm, no murmurs Musculoskeletal: no deformity Skin:  no rash/petichiae Vascular:  Normal pulses all extremities  Neurologic  Exam Mental Status: Awake and fully alert. Oriented to place and time. Recent and remote memory intact. Attention span, concentration and fund of knowledge appropriate. Mood and affect appropriate.  Cranial Nerves: Fundoscopic exam reveals sharp disc margins. Pupils equal, briskly reactive to light. Extraocular movements full without nystagmus. Visual fields full to confrontation. Hearing intact. Facial sensation intact.no facial weakness but occasional twitchings on the left face.e, tongue, palate moves normally and symmetrically.  Motor: Normal bulk and tone. Normal strength in all tested extremity muscles. Sensory.: intact to touch , pinprick , position and vibratory sensation.  Coordination: Rapid alternating movements normal in all extremities. Finger-to-nose and heel-to-shin performed accurately bilaterally. Gait and Station: Arises from chair without difficulty. Stance is normal. Gait demonstrates normal stride length and balance . Able to heel, toe and tandem walk without difficulty.  Reflexes: 1+ and symmetric. Toes downgoing.       ASSESSMENT: 46 year old male with remote history of left Bell's palsy and chronic neck and facial pain with concerns for carotid stenosis or dissection.due to history of habitual neck popping    PLAN: I had a long discussion with the patient and his wife regarding his chronic left-sided neck pain and remote history of Bell's palsy and answered questions. Check MRI scan of cervical spine to follow his degenerative cervical spine disease as well as CT angiogram of the neck and brain to look for any cervical carotid dissection given his habit of habitual   neck popping.I counseled him to stop this habit as it may injure his carotid arteries.Greater than 50% time during this 45 minute consultation visit was spent on counseling and coordination of care  about his chronic neck pain, popping and possibility of carotid dissection and stenosis and answered questions.He  will return for follow-up in 2 months or call earlier if necessary. Antony Contras, MD  [facility] [address] [place], [place] [address]  Phone [phone] Fax [phone] Note: This document was prepared with digital dictation and possible smart phrase technology. Any transcriptional errors that result from this process are unintentional.

## 2016-11-17 NOTE — Patient Instructions (Signed)
I had a long discussion with the patient and his wife regarding his chronic left-sided neck pain and remote history of Bell's palsy and answered questions. Check MRI scan of cervical spine to follow his degenerative cervical spine disease as well as CT angiogram of the neck and brain to look for any cervical carotid dissection given his habit of habitual   neck popping. He will return for follow-up in 2 months or call earlier if necessary.

## 2016-11-26 ENCOUNTER — Ambulatory Visit: Payer: Self-pay | Admitting: Family

## 2016-11-26 ENCOUNTER — Other Ambulatory Visit: Payer: Self-pay

## 2016-12-02 ENCOUNTER — Other Ambulatory Visit: Payer: Self-pay

## 2016-12-02 ENCOUNTER — Ambulatory Visit (HOSPITAL_BASED_OUTPATIENT_CLINIC_OR_DEPARTMENT_OTHER): Payer: Self-pay | Admitting: Family

## 2016-12-02 ENCOUNTER — Other Ambulatory Visit (HOSPITAL_BASED_OUTPATIENT_CLINIC_OR_DEPARTMENT_OTHER): Payer: Self-pay

## 2016-12-02 ENCOUNTER — Encounter: Payer: Self-pay | Admitting: Family

## 2016-12-02 ENCOUNTER — Other Ambulatory Visit: Payer: Self-pay | Admitting: Family

## 2016-12-02 VITALS — BP 121/68 | HR 76 | Temp 98.2°F | Resp 20 | Wt 200.4 lb

## 2016-12-02 DIAGNOSIS — D751 Secondary polycythemia: Secondary | ICD-10-CM

## 2016-12-02 DIAGNOSIS — E291 Testicular hypofunction: Secondary | ICD-10-CM

## 2016-12-02 DIAGNOSIS — F17210 Nicotine dependence, cigarettes, uncomplicated: Secondary | ICD-10-CM

## 2016-12-02 DIAGNOSIS — D500 Iron deficiency anemia secondary to blood loss (chronic): Secondary | ICD-10-CM

## 2016-12-02 LAB — CBC WITH DIFFERENTIAL (CANCER CENTER ONLY)
BASO#: 0.1 10*3/uL (ref 0.0–0.2)
BASO%: 0.5 % (ref 0.0–2.0)
EOS ABS: 0.2 10*3/uL (ref 0.0–0.5)
EOS%: 1.4 % (ref 0.0–7.0)
HCT: 41.8 % (ref 38.7–49.9)
HEMOGLOBIN: 14.2 g/dL (ref 13.0–17.1)
LYMPH#: 2.5 10*3/uL (ref 0.9–3.3)
LYMPH%: 21.4 % (ref 14.0–48.0)
MCH: 31.2 pg (ref 28.0–33.4)
MCHC: 34 g/dL (ref 32.0–35.9)
MCV: 92 fL (ref 82–98)
MONO#: 0.9 10*3/uL (ref 0.1–0.9)
MONO%: 7.8 % (ref 0.0–13.0)
NEUT%: 68.9 % (ref 40.0–80.0)
NEUTROS ABS: 8.1 10*3/uL — AB (ref 1.5–6.5)
Platelets: 344 10*3/uL (ref 145–400)
RBC: 4.55 10*6/uL (ref 4.20–5.70)
RDW: 13.3 % (ref 11.1–15.7)
WBC: 11.7 10*3/uL — AB (ref 4.0–10.0)

## 2016-12-02 LAB — CMP (CANCER CENTER ONLY)
ALBUMIN: 3.2 g/dL — AB (ref 3.3–5.5)
ALK PHOS: 37 U/L (ref 26–84)
ALT(SGPT): 60 U/L — ABNORMAL HIGH (ref 10–47)
AST: 33 U/L (ref 11–38)
BUN, Bld: 20 mg/dL (ref 7–22)
CHLORIDE: 105 meq/L (ref 98–108)
CO2: 28 mEq/L (ref 18–33)
CREATININE: 0.9 mg/dL (ref 0.6–1.2)
Calcium: 8.8 mg/dL (ref 8.0–10.3)
Glucose, Bld: 77 mg/dL (ref 73–118)
POTASSIUM: 4.3 meq/L (ref 3.3–4.7)
SODIUM: 144 meq/L (ref 128–145)
TOTAL PROTEIN: 7 g/dL (ref 6.4–8.1)
Total Bilirubin: 0.5 mg/dl (ref 0.20–1.60)

## 2016-12-02 NOTE — Progress Notes (Signed)
Hematology and Oncology Follow Up Visit  Stuart Hill [id number] Jan 04, 1971 46 y.o. 12/02/2016   Principle Diagnosis:  Secondary polycythemia JAK2 (-), smoker  LEFT Bell's palsy  Current Therapy:   Phlebotomy to maintain hematocrit below 42% Patient is allergic to aspirin-he is taking garlic as a blood thinner Plavix 75 mg po q day - stopped himself and does not want to restart   Interim History:  Stuart Hill is here today with his wife for follow-up. His Hct today is 41.8 so no phlebotomy with this visit.  He stopped Plavix on his own he states due to fatigue and feels better. He is not interested in restarting.  He is staying active with work and working out at [employer] but states that he is feeling fatigue. He requested his testosterone be checked.  He is still smoking.  No fever, chills, n/v, cough, rash, dizziness, SOB, chest pain, palpitations, abdominal pain or changes in bowel or bladder habits. He is still having some urinary "dribbling" at times and plans to follow-up with his PCP regarding this issue. No bowel incontinence.  No swelling in his extremities. He stumbled earlier today and landed on his left knee which is a little sore today.  He has chronic back and neck issues and generalized aches and pains due to this.  His appetite is good. No problems with speech or swallowing. He admits that he needs to drink more fluids. His weight is stable.   ECOG Performance Status: 1 - Symptomatic but completely ambulatory  Medications:  Allergies as of 12/02/2016      Reactions   Asa [aspirin] Hives   Advil [ibuprofen] Hives   Afrin [nasal Spray] Hives   Antihistamines, Chlorpheniramine-type Hives   Atrovent [ipratropium] Hives   Bc Powder [aspirin-salicylamide-caffeine] Hives   Benadryl [diphenhydramine Hcl] Hives   Buprenorphine Hcl Hives   Codeine Hives   Gastromax [metoclopramide] Hives   Morphine And Related Hives   Oxymetazoline Hives   Plavix [clopidogrel  Bisulfate] Other (See Comments)   Confusion, lethargy, slurred speech   [name] [carisoprodol] Hives   Tylenol [acetaminophen] Hives   Vicodin [hydrocodone-acetaminophen] Hives, Swelling, Other (See Comments)   ANGIOEDEMA   Pheniramine Hives, Rash      Medication List        Accurate as of 12/02/16  3:47 PM. Always use your most recent med list.          ALPRAZolam 1 MG tablet Commonly known as:  XANAX TAKE 1 TABLET BY MOUTH THREE TIMES A DAY AS NEEDED FOR ANXIETY       Allergies:  Allergies  Allergen Reactions  . Asa [Aspirin] Hives  . Advil [Ibuprofen] Hives  . Afrin [Nasal Spray] Hives  . Antihistamines, Chlorpheniramine-Type Hives  . Atrovent [Ipratropium] Hives  . Bc Powder [Aspirin-Salicylamide-Caffeine] Hives  . Benadryl [Diphenhydramine Hcl] Hives  . Buprenorphine Hcl Hives  . Codeine Hives  . Gastromax [Metoclopramide] Hives  . Morphine And Related Hives  . Oxymetazoline Hives  . Plavix [Clopidogrel Bisulfate] Other (See Comments)    Confusion, lethargy, slurred speech  . [name] [Carisoprodol] Hives  . Tylenol [Acetaminophen] Hives  . Vicodin [Hydrocodone-Acetaminophen] Hives, Swelling and Other (See Comments)    ANGIOEDEMA  . Pheniramine Hives and Rash    Past Medical History, Surgical history, Social history, and Family History were reviewed and updated.  Review of Systems: All other 10 point review of systems is negative.   Physical Exam:  weight is 200 lb 6.4 oz (90.9 kg). His  oral temperature is 98.2 F (36.8 C). His blood pressure is 121/68 and his pulse is 76. His respiration is 20 and oxygen saturation is 97%.   Wt Readings from Last 3 Encounters:  12/02/16 200 lb 6.4 oz (90.9 kg)  11/17/16 200 lb 3.2 oz (90.8 kg)  10/26/16 199 lb (90.3 kg)    Ocular: Sclerae unicteric, pupils equal, round and reactive to light Ear-nose-throat: Oropharynx clear, dentition fair Lymphatic: No cervical, supraclavicular or axillary adenopathy Lungs no rales or  rhonchi, good excursion bilaterally Heart regular rate and rhythm, no murmur appreciated Abd soft, nontender, positive bowel sounds, no liver or spleen tip palpated on exam, no fluid wave  MSK no focal spinal tenderness, no joint edema Neuro: non-focal, well-oriented, appropriate affect Breasts: Deferred   Lab Results  Component Value Date   WBC 11.7 (H) 12/02/2016   HGB 14.2 12/02/2016   HCT 41.8 12/02/2016   MCV 92 12/02/2016   PLT 344 12/02/2016   Lab Results  Component Value Date   FERRITIN 20 (L) 10/23/2016   IRON 73 10/23/2016   TIBC 386 10/23/2016   UIBC 313 10/23/2016   IRONPCTSAT 19 (L) 10/23/2016   Lab Results  Component Value Date   RBC 4.55 12/02/2016   No results found for: KPAFRELGTCHN, LAMBDASER, KAPLAMBRATIO No results found for: IGGSERUM, IGA, IGMSERUM No results found for: Odetta Pink, SPEI   Chemistry      Component Value Date/Time   NA 144 12/02/2016 1502   NA 139 11/12/2015 0945   K 4.3 12/02/2016 1502   K 3.7 11/12/2015 0945   CL 105 12/02/2016 1502   CO2 28 12/02/2016 1502   CO2 22 11/12/2015 0945   BUN 20 12/02/2016 1502   BUN 13.1 11/12/2015 0945   CREATININE 0.9 12/02/2016 1502   CREATININE 0.9 11/12/2015 0945      Component Value Date/Time   CALCIUM 8.8 12/02/2016 1502   CALCIUM 8.9 11/12/2015 0945   ALKPHOS 37 12/02/2016 1502   ALKPHOS 66 11/12/2015 0945   AST 33 12/02/2016 1502   AST 13 11/12/2015 0945   ALT 60 (H) 12/02/2016 1502   ALT 14 11/12/2015 0945   BILITOT 0.50 12/02/2016 1502   BILITOT <0.22 11/12/2015 0945      Impression and Plan: Stuart Hill is a 46 yo caucasian male with secondary polycythemia due to smoking (JAK2 -). He is still smoking and stopped his Plavix. He is c/o fatigue and testosterone is down to 42. I spoke with Dr. Marin Olp and we will have him start using Androgel 1% (5 gm) daily on the skin and recheck at his follow-up. He understands this may  also increase his Hgb and need for phlebotomy.  Hct is 41.8 so we will hold off on phlebotomizing him today.  We will plan to see him back again in another month for follow-up and lab.  He will contact our office with any questions or concerns. We can certainly see him sooner if need be.   Eliezer Bottom, NP 11/7/20183:47 PM

## 2016-12-03 ENCOUNTER — Other Ambulatory Visit: Payer: Self-pay | Admitting: *Deleted

## 2016-12-03 ENCOUNTER — Telehealth: Payer: Self-pay | Admitting: *Deleted

## 2016-12-03 DIAGNOSIS — E291 Testicular hypofunction: Secondary | ICD-10-CM

## 2016-12-03 LAB — TESTOSTERONE: TESTOSTERONE: 42 ng/dL — AB (ref 264–916)

## 2016-12-03 MED ORDER — TESTOSTERONE 50 MG/5GM (1%) TD GEL: 5.0000 g | Freq: Every day | TRANSDERMAL | 2 refills | Status: DC

## 2016-12-03 NOTE — Telephone Encounter (Addendum)
Message left on patient's personal voice mail  ----- Message from Eliezer Bottom, NP sent at 12/03/2016  9:43 AM EST ----- Regarding: Testosterone Testosterone low, [facility]. Thank you!  Sarah  ----- Message ----- From: Interface, Lab In Three Zero One Sent: 12/02/2016   3:14 PM To: Eliezer Bottom, NP

## 2016-12-14 ENCOUNTER — Ambulatory Visit
Admission: RE | Admit: 2016-12-14 | Discharge: 2016-12-14 | Disposition: A | Discharge status: Home / Self Care | Payer: No Typology Code available for payment source | Source: Ambulatory Visit | Attending: Neurology | Admitting: Neurology

## 2016-12-14 DIAGNOSIS — M542 Cervicalgia: Secondary | ICD-10-CM

## 2016-12-14 DIAGNOSIS — I771 Stricture of artery: Secondary | ICD-10-CM

## 2016-12-14 MED ORDER — IOPAMIDOL (ISOVUE-370) INJECTION 76%
75.0000 mL | Freq: Once | INTRAVENOUS | Status: AC | PRN
  Administered 2016-12-14: 75 mL via INTRAVENOUS

## 2016-12-22 ENCOUNTER — Telehealth: Payer: Self-pay | Admitting: Neurology

## 2016-12-22 NOTE — Telephone Encounter (Signed)
Pt calling for the results of the CAT scans done.  Pt made aware that once results are available either the Dr or his RN will call and share the results.  Pt understands and will wait to be contacted

## 2016-12-22 NOTE — Telephone Encounter (Signed)
Patient needs results of Ct angio head, and Ct angio neck.

## 2016-12-23 NOTE — Telephone Encounter (Signed)
I called and left a message on the patient's answering machine stating that CT angiogram of the brain and neck were both normal. There was incidental finding of tonsillitis I advised him to seek medical care with his primary care physician for the same. He was advised to call me back if he had any further questions

## 2016-12-26 NOTE — Progress Notes (Signed)
[name] at [facility] [address] [place], [place] [address] [phone]  Date:  12/28/2016   Name:  Stuart Hill   DOB:  [birth year]   MRN:  [id number]  PCP:  Darreld Mclean, MD    Chief Complaint: Results and Follow-up   History of Present Illness:  Stuart Hill is a 46 y.o. very pleasant male patient who presents with the following:  Follow-up visit today- has several concerns to discuss He sees oncology for his polycythemia, last visit with them 11/7 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 He is still smoking and stopped his Plavix. He is c/o fatigue and testosterone is down to 42. I spoke with Dr. Marin Olp and we will have him start using Androgel 1% (5 gm) daily on the skin and recheck at his follow-up. He understands this may also increase his Hgb and need for phlebotomy.   I last saw him in June to re-establish care  He had some sutures in his right pinky finger- they have been in for about 2 weeks now, placed at outside clinic I will remove these for him today.  He notes that the wound became infected and he was started on keflex a few days ago   I last filled his xanax in October with 2 RF  He wonders why his T has decreased so much over the last couple of years Pt states he has never used steroids at all in the past- however he does describe going to what sounds like a power lifting gym and being encouraged to obtain injectable testosterone from some of his friends there.   Also, his wife Stuart Hill who is present today has put on a dramatic amount of muscle since I last saw her and has striking vascularity of her arms.  I did not ask her if she might be using steroids.  He stopped taking his plavix due to "stroke like symptoms." he felt like he was stuttering and slurring his words when he used this medication so he stopped using it  He admits to smoking 1-  1.25 packs of cigarettes a day, and to drinking 10 espressos a day- he states that this is necessary in order to keep up with the demands on him  He notes that his tonsils were enlarged on a recent CT angio done for his carotids on 12/14/16.  There was some concern about carotid stenosis which turned out to be absent.  However his tonsils were a concern as below  IMPRESSION: 1. Normal arterial findings on CTA head and neck. 2. Bilateral Tonsillar enlargement with mild hyper-enhancement and level 2 Lymphadenopathy. Query recent symptoms of tonsillitis / URI. Otherwise consider a lymphoproliferative disorder. 3. Stable and normal CT appearance of the brain.  [name] states that he does not have a ST, but also that he cannot really feel his face since he had Bell's palsy about 18 months ago.   Patient Active Problem List   Diagnosis Date Noted  . 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 Polycythemia vera ([place]) 05/03/2015  . Hemoptysis 04/03/2015  . Condylomata acuminata 01/18/2015  . Generalized anxiety disorder 03/05/2014    Past Medical History:  Diagnosis Date  . Allergy   . Anxiety   . Arthritis   . Bell's palsy   . Erythrocytosis 04/10/2015  . Hyperlipidemia   . Hypertension   . Polycythemia vera ([place]) 05/03/2015  Past Surgical History:  Procedure Laterality Date  . DENTAL SURGERY    . HAND SURGERY      Social History   Tobacco Use  . Smoking status: Current Every Day Smoker    Packs/day: 1.00    Years: 24.00    Pack years: 24.00    Types: Cigarettes  . Smokeless tobacco: Never Used  Substance Use Topics  . Alcohol use: Yes    Alcohol/week: 0.6 oz    Types: 1 Cans of beer per week    Comment: 6-10 beer a week  . Drug use: No    Family History  Problem Relation Age of Onset  . COPD Mother   . Fibromyalgia Mother   . Heart disease Father   . Stroke Father   . Depression Brother   . Heart disease Brother   . Heart disease Maternal Grandmother   . Stroke  Maternal Grandmother   . Heart disease Maternal Grandfather   . Stroke Maternal Grandfather   . Heart disease Paternal Grandmother   . Stroke Paternal Grandmother   . Heart disease Paternal Grandfather   . Stroke Paternal Grandfather   . Stroke Paternal Aunt     Allergies  Allergen Reactions  . Asa [Aspirin] Hives  . Advil [Ibuprofen] Hives  . Afrin [Nasal Spray] Hives  . Antihistamines, Chlorpheniramine-Type Hives  . Atrovent [Ipratropium] Hives  . Bc Powder [Aspirin-Salicylamide-Caffeine] Hives  . Benadryl [Diphenhydramine Hcl] Hives  . Buprenorphine Hcl Hives  . Codeine Hives  . Gastromax [Metoclopramide] Hives  . Morphine And Related Hives  . Oxymetazoline Hives  . Plavix [Clopidogrel Bisulfate] Other (See Comments)    Confusion, lethargy, slurred speech  . [name] [Carisoprodol] Hives  . Tylenol [Acetaminophen] Hives  . Vicodin [Hydrocodone-Acetaminophen] Hives, Swelling and Other (See Comments)    ANGIOEDEMA  . Pheniramine Hives and Rash    Medication list has been reviewed and updated.  Current Outpatient Medications on File Prior to Visit  Medication Sig Dispense Refill  . ALPRAZolam (XANAX) 1 MG tablet TAKE 1 TABLET BY MOUTH THREE TIMES A DAY AS NEEDED FOR ANXIETY 90 tablet 2  . testosterone (ANDROGEL) 50 MG/5GM (1%) GEL Place 5 g daily onto the skin. (Patient not taking: Reported on 12/28/2016) 30 Package 2   No current facility-administered medications on file prior to visit.     Review of Systems:  As per HPI- otherwise negative. Here today with Stuart Hill, his wife   Physical Examination: Vitals:   12/28/16 1506  BP: (!) 148/84  Pulse: 73  Temp: 98 F (36.7 C)  SpO2: 94%   Vitals:   12/28/16 1506  Weight: 199 lb (90.3 kg)  Height: 6\' 3"  (1.905 m)   Body mass index is 24.87 kg/m. Ideal Body Weight: Weight in (lb) to have BMI = 25: 199.6  GEN: WDWN, NAD, Non-toxic, A & O x 3, normal weight, looks well HEENT: Atraumatic, Normocephalic. Neck  supple. No masses, No LAD.  Bilateral TM wnl, oropharynx displays enlarged tonsils with exudate bilaterally.  PEERL,EOMI.   Ears and Nose: No external deformity. CV: RRR, No M/G/R. No JVD. No thrill. No extra heart sounds. PULM: CTA B, no wheezes, crackles, rhonchi. No retractions. No resp. distress. No accessory muscle use. EXTR: No c/c/e NEURO Normal gait.  PSYCH: Normally interactive. Conversant. Not depressed or anxious appearing.  Calm demeanor.  Left pinky finger has a healing laceration on the distal pad, palmar aspect.  Removed 5 SI sutures.  Pt was then able to express  some pus from the wound   Assessment and Plan: Hypogonadism in male - Plan: Testosterone Total,Free,Bio, Males  Visit for suture removal  Pharyngitis, unspecified etiology - Plan: POCT rapid strep A, amoxicillin-clavulanate (AUGMENTIN) 875-125 MG tablet  Open wound of finger, infected, initial encounter - Plan: amoxicillin-clavulanate (AUGMENTIN) 875-125 MG tablet  Recently noted to have a lot total T; however, it looks like this was drawn in the afternoon.  Will repeat a morning level for him before we start any other therapy.  Explained that I do agree that a topical T would be safer for him than injectable.   Changed from keflex to augmentin for infected finger and also pharyngitis.   Removed sutures from his wound, he will alert me if not improving with abx change   Signed Lamar Blinks, MD

## 2016-12-28 ENCOUNTER — Ambulatory Visit (INDEPENDENT_AMBULATORY_CARE_PROVIDER_SITE_OTHER): Payer: Self-pay | Admitting: Family Medicine

## 2016-12-28 ENCOUNTER — Encounter: Payer: Self-pay | Admitting: Family Medicine

## 2016-12-28 VITALS — BP 148/84 | HR 73 | Temp 98.0°F | Ht 75.0 in | Wt 199.0 lb

## 2016-12-28 DIAGNOSIS — E291 Testicular hypofunction: Secondary | ICD-10-CM

## 2016-12-28 DIAGNOSIS — L089 Local infection of the skin and subcutaneous tissue, unspecified: Secondary | ICD-10-CM

## 2016-12-28 DIAGNOSIS — S61209A Unspecified open wound of unspecified finger without damage to nail, initial encounter: Secondary | ICD-10-CM

## 2016-12-28 DIAGNOSIS — J029 Acute pharyngitis, unspecified: Secondary | ICD-10-CM

## 2016-12-28 DIAGNOSIS — Z4802 Encounter for removal of sutures: Secondary | ICD-10-CM

## 2016-12-28 MED ORDER — AMOXICILLIN-POT CLAVULANATE 875-125 MG PO TABS: 1.0000 | ORAL_TABLET | Freq: Two times a day (BID) | ORAL | 0 refills | Status: DC

## 2016-12-28 NOTE — Patient Instructions (Signed)
I am going to change your antibiotic to augmentin (instead of keflex) for your ear and your throat Please come in for an early morning testosterone level at your convenience.   Let's meet back in 2 weeks to look at your tonsils again Please quit smoking!!

## 2016-12-29 LAB — POCT RAPID STREP A (OFFICE): RAPID STREP A SCREEN: NEGATIVE

## 2017-01-01 ENCOUNTER — Ambulatory Visit (HOSPITAL_BASED_OUTPATIENT_CLINIC_OR_DEPARTMENT_OTHER): Payer: Self-pay

## 2017-01-01 ENCOUNTER — Telehealth: Payer: Self-pay

## 2017-01-01 ENCOUNTER — Encounter: Payer: Self-pay | Admitting: Family

## 2017-01-01 ENCOUNTER — Ambulatory Visit (HOSPITAL_BASED_OUTPATIENT_CLINIC_OR_DEPARTMENT_OTHER): Payer: No Typology Code available for payment source | Admitting: Family

## 2017-01-01 ENCOUNTER — Other Ambulatory Visit: Payer: Self-pay

## 2017-01-01 ENCOUNTER — Other Ambulatory Visit (HOSPITAL_BASED_OUTPATIENT_CLINIC_OR_DEPARTMENT_OTHER): Payer: Self-pay

## 2017-01-01 VITALS — BP 116/68 | HR 56 | Resp 18

## 2017-01-01 DIAGNOSIS — F17218 Nicotine dependence, cigarettes, with other nicotine-induced disorders: Secondary | ICD-10-CM

## 2017-01-01 DIAGNOSIS — D751 Secondary polycythemia: Secondary | ICD-10-CM

## 2017-01-01 DIAGNOSIS — E291 Testicular hypofunction: Secondary | ICD-10-CM

## 2017-01-01 DIAGNOSIS — D500 Iron deficiency anemia secondary to blood loss (chronic): Secondary | ICD-10-CM

## 2017-01-01 LAB — CBC WITH DIFFERENTIAL (CANCER CENTER ONLY)
BASO#: 0.1 10*3/uL (ref 0.0–0.2)
BASO%: 0.7 % (ref 0.0–2.0)
EOS ABS: 0.3 10*3/uL (ref 0.0–0.5)
EOS%: 2.8 % (ref 0.0–7.0)
HCT: 43.9 % (ref 38.7–49.9)
HEMOGLOBIN: 15.3 g/dL (ref 13.0–17.1)
LYMPH#: 2.8 10*3/uL (ref 0.9–3.3)
LYMPH%: 27.9 % (ref 14.0–48.0)
MCH: 30.7 pg (ref 28.0–33.4)
MCHC: 34.9 g/dL (ref 32.0–35.9)
MCV: 88 fL (ref 82–98)
MONO#: 0.9 10*3/uL (ref 0.1–0.9)
MONO%: 8.9 % (ref 0.0–13.0)
NEUT%: 59.7 % (ref 40.0–80.0)
NEUTROS ABS: 6.1 10*3/uL (ref 1.5–6.5)
Platelets: 313 10*3/uL (ref 145–400)
RBC: 4.99 10*6/uL (ref 4.20–5.70)
RDW: 13.6 % (ref 11.1–15.7)
WBC: 10.1 10*3/uL — AB (ref 4.0–10.0)

## 2017-01-01 LAB — CMP (CANCER CENTER ONLY)
ALBUMIN: 3.3 g/dL (ref 3.3–5.5)
ALT(SGPT): 368 U/L (ref 10–47)
AST: 99 U/L — ABNORMAL HIGH (ref 11–38)
Alkaline Phosphatase: 44 U/L (ref 26–84)
BUN, Bld: 18 mg/dL (ref 7–22)
CHLORIDE: 103 meq/L (ref 98–108)
CO2: 27 mEq/L (ref 18–33)
Calcium: 8.8 mg/dL (ref 8.0–10.3)
Creat: 1 mg/dl (ref 0.6–1.2)
Glucose, Bld: 93 mg/dL (ref 73–118)
POTASSIUM: 4.3 meq/L (ref 3.3–4.7)
Sodium: 143 mEq/L (ref 128–145)
TOTAL PROTEIN: 7 g/dL (ref 6.4–8.1)
Total Bilirubin: 0.6 mg/dl (ref 0.20–1.60)

## 2017-01-01 NOTE — Telephone Encounter (Signed)
Dr Marin Olp and Judson Roch, NP aware of critical high ALT. No new orders at this time. dph

## 2017-01-01 NOTE — Patient Instructions (Signed)

## 2017-01-01 NOTE — Progress Notes (Addendum)
Hematology and Oncology Follow Up Visit  Stuart Hill [id number] 08/20/1970 46 y.o. 01/01/2017   Principle Diagnosis:  Secondary polycythemia JAK2 (-), smoker LEFT Bell's palsy  Current Therapy:   Phlebotomy to maintain hematocrit below 42% Plavix 75 mg po q day - stopped himself and does not want to restart   Interim History:  Stuart Hill is here today with his wife for follow-up. He is feeling fatigued and Hct is 43.9. We will proceed with phlebotomy.  The main concern today is his significantly elevated LFT's. He has no abdominal pain or bloating at this time. We had a frank discussion after he mentioned wanting to "body build" and he admitted to using two different supplements one of which is a meal replacement he saw advertised on television. He stopped one of the supplements 3 days ago and verbalized that he will stop the other immediately. Of note, his wife states that she has also been taking these same supplements and her LFT's at her GP visit today were also elevated.  He states that he is hydrating well and will continue to do so. His weight is stable.  His appetite comes and goes.  His testosterone was 40 at his last visit and he has still not started the testosterone gel. He plans to start this tomorrow. Hopefully this will not cause a significant bump in his his Hgb. If this does not work we will look at trying injections.  No fever, chills, n/v, cough, rash, dizziness, SOB, chest pain, palpitations, abdominal pain or changes in bowel or bladder habits.  No swelling, numbness or tingling in his extremities. Same generalized aches and pains. This is unchanged.   ECOG Performance Status: 1 - Symptomatic but completely ambulatory  Medications:  Allergies as of 01/01/2017      Reactions   Asa [aspirin] Hives   Advil [ibuprofen] Hives   Afrin [nasal Spray] Hives   Antihistamines, Chlorpheniramine-type Hives   Atrovent [ipratropium] Hives   Bc Powder  [aspirin-salicylamide-caffeine] Hives   Benadryl [diphenhydramine Hcl] Hives   Buprenorphine Hcl Hives   Codeine Hives   Gastromax [metoclopramide] Hives   Morphine And Related Hives   Oxymetazoline Hives   Plavix [clopidogrel Bisulfate] Other (See Comments)   Confusion, lethargy, slurred speech   [name] [carisoprodol] Hives   Tylenol [acetaminophen] Hives   Vicodin [hydrocodone-acetaminophen] Hives, Swelling, Other (See Comments)   ANGIOEDEMA   Pheniramine Hives, Rash      Medication List        Accurate as of 01/01/17 11:22 PM. Always use your most recent med list.          ALPRAZolam 1 MG tablet Commonly known as:  XANAX TAKE 1 TABLET BY MOUTH THREE TIMES A DAY AS NEEDED FOR ANXIETY   amoxicillin-clavulanate 875-125 MG tablet Commonly known as:  AUGMENTIN Take 1 tablet by mouth 2 (two) times daily.   testosterone 50 MG/5GM (1%) Gel Commonly known as:  ANDROGEL Place 5 g daily onto the skin.       Allergies:  Allergies  Allergen Reactions  . Asa [Aspirin] Hives  . Advil [Ibuprofen] Hives  . Afrin [Nasal Spray] Hives  . Antihistamines, Chlorpheniramine-Type Hives  . Atrovent [Ipratropium] Hives  . Bc Powder [Aspirin-Salicylamide-Caffeine] Hives  . Benadryl [Diphenhydramine Hcl] Hives  . Buprenorphine Hcl Hives  . Codeine Hives  . Gastromax [Metoclopramide] Hives  . Morphine And Related Hives  . Oxymetazoline Hives  . Plavix [Clopidogrel Bisulfate] Other (See Comments)    Confusion, lethargy, slurred speech  .  [name] [Carisoprodol] Hives  . Tylenol [Acetaminophen] Hives  . Vicodin [Hydrocodone-Acetaminophen] Hives, Swelling and Other (See Comments)    ANGIOEDEMA  . Pheniramine Hives and Rash    Past Medical History, Surgical history, Social history, and Family History were reviewed and updated.  Review of Systems: All other 10 point review of systems is negative.   Physical Exam:  blood pressure is 116/68 and his pulse is 56 (abnormal). His respiration  is 18 and oxygen saturation is 99%.   Wt Readings from Last 3 Encounters:  12/28/16 199 lb (90.3 kg)  12/02/16 200 lb 6.4 oz (90.9 kg)  11/17/16 200 lb 3.2 oz (90.8 kg)    Ocular: Sclerae unicteric, pupils equal, round and reactive to light Ear-nose-throat: Oropharynx clear, dentition fair Lymphatic: No cervical, supraclavicular or axillary adenopathy Lungs no rales or rhonchi, good excursion bilaterally Heart regular rate and rhythm, no murmur appreciated Abd soft, nontender, positive bowel sounds, no liver or spleen tip palpated on exam, no fluid wave  MSK no focal spinal tenderness, no joint edema Neuro: non-focal, well-oriented, appropriate affect Breasts: Deferred   Lab Results  Component Value Date   WBC 10.1 (H) 01/01/2017   HGB 15.3 01/01/2017   HCT 43.9 01/01/2017   MCV 88 01/01/2017   PLT 313 01/01/2017   Lab Results  Component Value Date   FERRITIN 20 (L) 10/23/2016   IRON 73 10/23/2016   TIBC 386 10/23/2016   UIBC 313 10/23/2016   IRONPCTSAT 19 (L) 10/23/2016   Lab Results  Component Value Date   RBC 4.99 01/01/2017   No results found for: KPAFRELGTCHN, LAMBDASER, KAPLAMBRATIO No results found for: IGGSERUM, IGA, IGMSERUM No results found for: Odetta Pink, SPEI   Chemistry      Component Value Date/Time   NA 143 01/01/2017 1453   NA 139 11/12/2015 0945   K 4.3 01/01/2017 1453   K 3.7 11/12/2015 0945   CL 103 01/01/2017 1453   CO2 27 01/01/2017 1453   CO2 22 11/12/2015 0945   BUN 18 01/01/2017 1453   BUN 13.1 11/12/2015 0945   CREATININE 1.0 01/01/2017 1453   CREATININE 0.9 11/12/2015 0945      Component Value Date/Time   CALCIUM 8.8 01/01/2017 1453   CALCIUM 8.9 11/12/2015 0945   ALKPHOS 44 01/01/2017 1453   ALKPHOS 66 11/12/2015 0945   AST 99 (H) 01/01/2017 1453   AST 13 11/12/2015 0945   ALT 368 (HH) 01/01/2017 1453   ALT 14 11/12/2015 0945   BILITOT 0.60 01/01/2017 1453   BILITOT  <0.22 11/12/2015 0945      Impression and Plan: Stuart Hill is a 46 yo caucasian gentleman with secondary polycythemia sur to smoking. He continues to smoke but is trying to cut back. His Hct today is 43.9 requiring phlebotomy.  He has been taking supplements off of TV to help when working out and this appears to have caused a extreme increase in his LFTs. He verbalized understanding that he is to stop these and not restart. He will continue to hydrated well and we will plan to see him back in another month for follow-up and repeat lab work.  He will try the Testosterone gel tomorrow and we will check another level at his next visit.  Both he and his wife know to contact our office with any questions or concerns. We can certainly see him sooner if need be. He will go to the ED in the event of an  emergency.   [name], NP 12/7/201811:22 PM

## 2017-01-01 NOTE — Progress Notes (Signed)
Stuart Hill presents today for phlebotomy per MD orders. Phlebotomy procedure started at 1610 and ended at 1615 with  560 grams removed via 16G to RAC. Patient observed for 15 minutes after procedure without any incident. Patient tolerated procedure well. Diet and nutrition offered.

## 2017-01-02 LAB — TESTOSTERONE: Testosterone, Serum: 80 ng/dL — ABNORMAL LOW (ref 264–916)

## 2017-01-05 LAB — IRON AND TIBC
%SAT: 21 % (ref 20–55)
Iron: 93 ug/dL (ref 42–163)
TIBC: 435 ug/dL — AB (ref 202–409)
UIBC: 342 ug/dL (ref 117–376)

## 2017-01-05 LAB — FERRITIN: FERRITIN: 39 ng/mL (ref 22–316)

## 2017-01-10 NOTE — Progress Notes (Addendum)
[place] at [facility] [address], [place], [place] [address] [phone]  Date:  01/11/2017   Name:  Stuart Hill   DOB:  Jan 13, 1971   MRN:  [id number]  PCP:  Darreld Mclean, MD    Chief Complaint: No chief complaint on file.   History of Present Illness:  Stuart Hill is a 46 y.o. very pleasant male patient who presents with the following:  Follow-up visit today He has a history of secondary polycythemia for which he undergoes intermittent phlebotomy recenly he has been noted to have quite low T, and also to have elevated LFTs perhaps related to work-out supplements he has been taking. He has labs drawn at hematology about 10 days ago and is following up on these today. He has been off these supplements for about 10 days now, states that he took them for no longer than a month. He is really not sure what was in them   He would like to check his T today- it will be about 9:15- 9:30am when his blood is drawn as he was late to his appt this morning  Also on 12/3 I started him on augmentin for his enlarged tonsils.  His throat does not hurt but his tonsils continue to be enlarged He notes that as a young child he was told that he might need his tonsils removed; however this did not end up happening.  His wife [name] adds that he is snoring more than he used to, and sometimes it seems as though phlegm gets caught in his throat  He continues to smoke about one PPD   Patient Active Problem List   Diagnosis Date Noted  . 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 Polycythemia vera ([place]) 05/03/2015  . Hemoptysis 04/03/2015  . Condylomata acuminata 01/18/2015  . Generalized anxiety disorder 03/05/2014    Past Medical History:  Diagnosis Date  . Allergy   . Anxiety   . Arthritis   . Bell's palsy   . Erythrocytosis 04/10/2015  . Hyperlipidemia   . Hypertension   . Polycythemia vera ([place]) 05/03/2015    Past Surgical  History:  Procedure Laterality Date  . DENTAL SURGERY    . HAND SURGERY      Social History   Tobacco Use  . Smoking status: Current Every Day Smoker    Packs/day: 1.00    Years: 24.00    Pack years: 24.00    Types: Cigarettes  . Smokeless tobacco: Never Used  Substance Use Topics  . Alcohol use: Yes    Alcohol/week: 0.6 oz    Types: 1 Cans of beer per week    Comment: 6-10 beer a week  . Drug use: No    Family History  Problem Relation Age of Onset  . COPD Mother   . Fibromyalgia Mother   . Heart disease Father   . Stroke Father   . Depression Brother   . Heart disease Brother   . Heart disease Maternal Grandmother   . Stroke Maternal Grandmother   . Heart disease Maternal Grandfather   . Stroke Maternal Grandfather   . Heart disease Paternal Grandmother   . Stroke Paternal Grandmother   . Heart disease Paternal Grandfather   . Stroke Paternal Grandfather   . Stroke Paternal Aunt     Allergies  Allergen Reactions  . Asa [Aspirin] Hives  . Advil [Ibuprofen] Hives  . Afrin [Nasal Spray] Hives  . Antihistamines,  Chlorpheniramine-Type Hives  . Atrovent [Ipratropium] Hives  . Bc Powder [Aspirin-Salicylamide-Caffeine] Hives  . Benadryl [Diphenhydramine Hcl] Hives  . Buprenorphine Hcl Hives  . Codeine Hives  . Gastromax [Metoclopramide] Hives  . Morphine And Related Hives  . Oxymetazoline Hives  . Plavix [Clopidogrel Bisulfate] Other (See Comments)    Confusion, lethargy, slurred speech  . [name] [Carisoprodol] Hives  . Tylenol [Acetaminophen] Hives  . Vicodin [Hydrocodone-Acetaminophen] Hives, Swelling and Other (See Comments)    ANGIOEDEMA  . Pheniramine Hives and Rash    Medication list has been reviewed and updated.  Current Outpatient Medications on File Prior to Visit  Medication Sig Dispense Refill  . ALPRAZolam (XANAX) 1 MG tablet TAKE 1 TABLET BY MOUTH THREE TIMES A DAY AS NEEDED FOR ANXIETY 90 tablet 2  . amoxicillin-clavulanate (AUGMENTIN)  875-125 MG tablet Take 1 tablet by mouth 2 (two) times daily. 20 tablet 0  . testosterone (ANDROGEL) 50 MG/5GM (1%) GEL Place 5 g daily onto the skin. (Patient not taking: Reported on 12/28/2016) 30 Package 2   No current facility-administered medications on file prior to visit.     Review of Systems:  As per HPI- otherwise negative.   Physical Examination: Vitals:   01/11/17 0854  BP: 124/65  Pulse: 70  Resp: 16  Temp: 98.2 F (36.8 C)  SpO2: 100%   Vitals:   01/11/17 0854  Weight: 195 lb (88.5 kg)   Body mass index is 24.37 kg/m. Ideal Body Weight:    GEN: WDWN, NAD, Non-toxic, A & O x 3, looks well, here today with [name], his wife HEENT: Atraumatic, Normocephalic. Neck supple. No masses, No LAD.  Bilateral TM wnl,  PEERL,EOMI.  He continues to have large tonsils bilaterally  Ears and Nose: No external deformity. CV: RRR, No M/G/R. No JVD. No thrill. No extra heart sounds. PULM: CTA B, no wheezes, crackles, rhonchi. No retractions. No resp. distress. No accessory muscle use. ABD: S, NT, ND, +BS. No rebound. No HSM. EXTR: No c/c/e NEURO Normal gait.  PSYCH: Normally interactive. Conversant. Not depressed or anxious appearing.  Calm demeanor.    Assessment and Plan: Hypogonadism in male - Plan: Testosterone Total,Free,Bio, Males  Transaminitis - Plan: Hepatic function panel  Enlarged tonsils - Plan: Ambulatory referral to ENT  Check a full am T panel today Follow-up on liver function Referral to ENT to discuss his enlarged tonsils   Signed Lamar Blinks, MD  Received his labs 12/19 and called- his T has recovered well since he stopped taking the suspicious work- out supplement.  He did use T gel but only 4-5 x in total.  Since his T is so much better, suggested that he hold off on using the testosterone gel and repeat his T in a month or so instead.  He plans to do so  Results for orders placed or performed in visit on 01/11/17  Testosterone Total,Free,Bio,  Males  Result Value Ref Range   Testosterone 307 250 - 827 ng/dL   Albumin 4.0 3.6 - 5.1 g/dL   Sex Hormone Binding 32 10 - 50 nmol/L   Testosterone, Free 41.3 (L) 46.0 - 224.0 pg/mL   Testosterone, Bioavailable 75.9 (L) 110.0 - 575 ng/dL  Hepatic function panel  Result Value Ref Range   Total Bilirubin 0.3 0.2 - 1.2 mg/dL   Bilirubin, Direct 0.0 0.0 - 0.3 mg/dL   Alkaline Phosphatase 44 39 - 117 U/L   AST 22 0 - 37 U/L   ALT 55 (H) 0 -  53 U/L   Total Protein 7.1 6.0 - 8.3 g/dL   Albumin 4.0 3.5 - 5.2 g/dL

## 2017-01-11 ENCOUNTER — Encounter: Payer: Self-pay | Admitting: Family Medicine

## 2017-01-11 ENCOUNTER — Ambulatory Visit (INDEPENDENT_AMBULATORY_CARE_PROVIDER_SITE_OTHER): Payer: Self-pay | Admitting: Family Medicine

## 2017-01-11 VITALS — BP 124/65 | HR 70 | Temp 98.2°F | Resp 16 | Wt 195.0 lb

## 2017-01-11 DIAGNOSIS — J351 Hypertrophy of tonsils: Secondary | ICD-10-CM

## 2017-01-11 DIAGNOSIS — R7401 Elevation of levels of liver transaminase levels: Secondary | ICD-10-CM

## 2017-01-11 DIAGNOSIS — E291 Testicular hypofunction: Secondary | ICD-10-CM

## 2017-01-11 DIAGNOSIS — R740 Nonspecific elevation of levels of transaminase and lactic acid dehydrogenase [LDH]: Secondary | ICD-10-CM

## 2017-01-11 LAB — HEPATIC FUNCTION PANEL
ALBUMIN: 4 g/dL (ref 3.5–5.2)
ALT: 55 U/L — ABNORMAL HIGH (ref 0–53)
AST: 22 U/L (ref 0–37)
Alkaline Phosphatase: 44 U/L (ref 39–117)
Bilirubin, Direct: 0 mg/dL (ref 0.0–0.3)
Total Bilirubin: 0.3 mg/dL (ref 0.2–1.2)
Total Protein: 7.1 g/dL (ref 6.0–8.3)

## 2017-01-11 NOTE — Patient Instructions (Signed)
Take care- I will set you up for an ENT evaluation We will also check your testosterone and liver function today- I will let you know your results asap

## 2017-01-12 LAB — TESTOSTERONE TOTAL,FREE,BIO, MALES
Albumin: 4 g/dL (ref 3.6–5.1)
Sex Hormone Binding: 32 nmol/L (ref 10–50)
TESTOSTERONE BIOAVAILABLE: 75.9 ng/dL — AB (ref >=110.0)
TESTOSTERONE FREE: 41.3 pg/mL — AB (ref 46.0–224.0)
TESTOSTERONE: 307 ng/dL (ref 250–827)

## 2017-01-13 ENCOUNTER — Encounter: Payer: Self-pay | Admitting: Family Medicine

## 2017-02-08 ENCOUNTER — Ambulatory Visit: Payer: Self-pay | Admitting: Neurology

## 2017-02-08 ENCOUNTER — Telehealth: Payer: Self-pay

## 2017-02-08 NOTE — Telephone Encounter (Signed)
Patient no show for appt today. 

## 2017-02-09 ENCOUNTER — Inpatient Hospital Stay: Payer: Self-pay | Attending: Family | Admitting: Family

## 2017-02-09 ENCOUNTER — Inpatient Hospital Stay: Payer: Self-pay

## 2017-02-09 ENCOUNTER — Other Ambulatory Visit: Payer: Self-pay

## 2017-02-09 ENCOUNTER — Encounter: Payer: Self-pay | Admitting: Neurology

## 2017-02-09 VITALS — BP 118/79 | HR 62 | Temp 98.3°F | Resp 20

## 2017-02-09 VITALS — BP 120/59 | HR 57 | Temp 98.3°F | Resp 20 | Wt 196.0 lb

## 2017-02-09 DIAGNOSIS — D751 Secondary polycythemia: Secondary | ICD-10-CM

## 2017-02-09 DIAGNOSIS — D500 Iron deficiency anemia secondary to blood loss (chronic): Secondary | ICD-10-CM

## 2017-02-09 DIAGNOSIS — E291 Testicular hypofunction: Secondary | ICD-10-CM

## 2017-02-09 DIAGNOSIS — Z79899 Other long term (current) drug therapy: Secondary | ICD-10-CM | POA: Insufficient documentation

## 2017-02-09 DIAGNOSIS — G510 Bell's palsy: Secondary | ICD-10-CM | POA: Insufficient documentation

## 2017-02-09 DIAGNOSIS — R7989 Other specified abnormal findings of blood chemistry: Secondary | ICD-10-CM

## 2017-02-09 LAB — CBC WITH DIFFERENTIAL (CANCER CENTER ONLY)
Basophils Absolute: 0.1 10*3/uL (ref 0.0–0.1)
Basophils Relative: 1 %
Eosinophils Absolute: 0.2 10*3/uL (ref 0.0–0.5)
Eosinophils Relative: 2 %
HCT: 45.8 % (ref 38.7–49.9)
Hemoglobin: 15.8 g/dL (ref 13.0–17.1)
LYMPHS PCT: 26 %
Lymphs Abs: 2.4 10*3/uL (ref 0.9–3.3)
MCH: 30.6 pg (ref 28.0–33.4)
MCHC: 34.5 g/dL (ref 32.0–35.9)
MCV: 88.6 fL (ref 82.0–98.0)
MONO ABS: 0.8 10*3/uL (ref 0.1–0.9)
MONOS PCT: 8 %
Neutro Abs: 6 10*3/uL (ref 1.5–6.5)
Neutrophils Relative %: 63 %
Platelet Count: 269 10*3/uL (ref 140–400)
RBC: 5.17 MIL/uL (ref 4.20–5.70)
RDW: 13.8 % (ref 11.1–15.7)
WBC Count: 9.4 10*3/uL (ref 4.0–10.3)

## 2017-02-09 LAB — CMP (CANCER CENTER ONLY)
ALT: 28 U/L (ref 0–55)
ANION GAP: 8 (ref 5–15)
AST: 24 U/L (ref 5–34)
Albumin: 3.6 g/dL (ref 3.5–5.0)
Alkaline Phosphatase: 55 U/L (ref 26–84)
BILIRUBIN TOTAL: 0.6 mg/dL (ref 0.2–1.2)
BUN: 11 mg/dL (ref 7–22)
CHLORIDE: 104 mmol/L (ref 98–108)
CO2: 30 mmol/L (ref 18–33)
Calcium: 9 mg/dL (ref 8.0–10.3)
Creatinine: 1.1 mg/dL (ref 0.70–1.30)
Glucose, Bld: 81 mg/dL (ref 70–118)
Potassium: 4.2 mmol/L (ref 3.3–4.7)
Sodium: 142 mmol/L (ref 128–145)
Total Protein: 7.2 g/dL (ref 6.4–8.1)

## 2017-02-09 NOTE — Progress Notes (Signed)
Hematology and Oncology Follow Up Visit  Stuart Hill [id number] [birth year] 47 y.o. 02/09/2017   Principle Diagnosis:  Secondary polycythemia JAK2 (-), smoker LEFT Bell's palsy  Current Therapy:   Phlebotomy to maintain hematocrit below 42% Plavix 75 mg po q day- stopped himself and does not want to restart   Interim History:  Stuart Hill is here today with his wife for follow-up. He is feeling fatigued and has also had intermittent headaches and occasional blurry vision. His Hct is 45.8 so we will phlebotomize him today.  He states that he has been smoking more due to stress at work and one of his bosses passing away unexpectedly.  His total testosterone was 307 and free testosterone 41. I spoke with Stuart Hill and he is not comfortable with doing testosterone injections. With him still smoking heavily he would be at great risk for stroke. He did feel that that the androderm patch would be ok.  Stuart Hill is considering adding a new antiplatelet medication as well. The patient states that he is allergic to aspirin and Plavix.   No fever, chills, n/v, cough, rash, SOB, chest pain, palpitations, abdominal pain or changes in bowel or bladder habits.  No swelling, numbness or tingling in her extremities. No c/o pain.  He has maintained a good appetite and is staying well hydrated. Her weight is stable.   ECOG Performance Status: 1 - Symptomatic but completely ambulatory  Medications:  Allergies as of 02/09/2017      Reactions   Asa [aspirin] Hives   Advil [ibuprofen] Hives   Afrin [nasal Spray] Hives   Antihistamines, Chlorpheniramine-type Hives   Atrovent [ipratropium] Hives   Bc Powder [aspirin-salicylamide-caffeine] Hives   Benadryl [diphenhydramine Hcl] Hives   Buprenorphine Hcl Hives   Codeine Hives   Gastromax [metoclopramide] Hives   Morphine And Related Hives   Oxymetazoline Hives   Plavix [clopidogrel Bisulfate] Other (See Comments)   Confusion, lethargy,  slurred speech   [name] [carisoprodol] Hives   Tylenol [acetaminophen] Hives   Vicodin [hydrocodone-acetaminophen] Hives, Swelling, Other (See Comments)   ANGIOEDEMA   Pheniramine Hives, Rash      Medication List        Accurate as of 02/09/17  3:34 PM. Always use your most recent med list.          ALPRAZolam 1 MG tablet Commonly known as:  XANAX TAKE 1 TABLET BY MOUTH THREE TIMES A DAY AS NEEDED FOR ANXIETY   esomeprazole 20 MG capsule Commonly known as:  NEXIUM Take 20 mg by mouth daily at 12 noon.   testosterone 50 MG/5GM (1%) Gel Commonly known as:  ANDROGEL Place 5 g daily onto the skin.       Allergies:  Allergies  Allergen Reactions  . Asa [Aspirin] Hives  . Advil [Ibuprofen] Hives  . Afrin [Nasal Spray] Hives  . Antihistamines, Chlorpheniramine-Type Hives  . Atrovent [Ipratropium] Hives  . Bc Powder [Aspirin-Salicylamide-Caffeine] Hives  . Benadryl [Diphenhydramine Hcl] Hives  . Buprenorphine Hcl Hives  . Codeine Hives  . Gastromax [Metoclopramide] Hives  . Morphine And Related Hives  . Oxymetazoline Hives  . Plavix [Clopidogrel Bisulfate] Other (See Comments)    Confusion, lethargy, slurred speech  . [name] [Carisoprodol] Hives  . Tylenol [Acetaminophen] Hives  . Vicodin [Hydrocodone-Acetaminophen] Hives, Swelling and Other (See Comments)    ANGIOEDEMA  . Pheniramine Hives and Rash    Past Medical History, Surgical history, Social history, and Family History were reviewed and updated.  Review of Systems:  All other 10 point review of systems is negative.   Physical Exam:  weight is 196 lb (88.9 kg). His oral temperature is 98.3 F (36.8 C). His blood pressure is 120/59 (abnormal) and his pulse is 57 (abnormal). His respiration is 20.   Wt Readings from Last 3 Encounters:  02/09/17 196 lb (88.9 kg)  01/11/17 195 lb (88.5 kg)  12/28/16 199 lb (90.3 kg)    Ocular: Sclerae unicteric, pupils equal, round and reactive to light Ear-nose-throat:  Oropharynx clear, dentition fair Lymphatic: No cervical, supraclavicular or axillary adenopathy Lungs no rales or rhonchi, good excursion bilaterally Heart regular rate and rhythm, no murmur appreciated Abd soft, nontender, positive bowel sounds, no liver or spleen tip palpated on exam, no fluid wave  MSK no focal spinal tenderness, no joint edema Neuro: non-focal, well-oriented, appropriate affect Breasts: Deferred   Lab Results  Component Value Date   WBC 10.1 (H) 01/01/2017   HGB 15.3 01/01/2017   HCT 45.8 02/09/2017   MCV 88.6 02/09/2017   PLT 313 01/01/2017   Lab Results  Component Value Date   FERRITIN 39 01/01/2017   IRON 93 01/01/2017   TIBC 435 (H) 01/01/2017   UIBC 342 01/01/2017   IRONPCTSAT 21 01/01/2017   Lab Results  Component Value Date   RBC 5.17 02/09/2017   No results found for: KPAFRELGTCHN, LAMBDASER, KAPLAMBRATIO No results found for: IGGSERUM, IGA, IGMSERUM No results found for: Kathrynn Ducking, MSPIKE, SPEI   Chemistry      Component Value Date/Time   NA 143 01/01/2017 1453   NA 139 11/12/2015 0945   K 4.3 01/01/2017 1453   K 3.7 11/12/2015 0945   CL 103 01/01/2017 1453   CO2 27 01/01/2017 1453   CO2 22 11/12/2015 0945   BUN 18 01/01/2017 1453   BUN 13.1 11/12/2015 0945   CREATININE 1.0 01/01/2017 1453   CREATININE 0.9 11/12/2015 0945      Component Value Date/Time   CALCIUM 8.8 01/01/2017 1453   CALCIUM 8.9 11/12/2015 0945   ALKPHOS 44 01/11/2017 0928   ALKPHOS 44 01/01/2017 1453   ALKPHOS 66 11/12/2015 0945   AST 22 01/11/2017 0928   AST 99 (H) 01/01/2017 1453   AST 13 11/12/2015 0945   ALT 55 (H) 01/11/2017 0928   ALT 368 (HH) 01/01/2017 1453   ALT 14 11/12/2015 0945   BILITOT 0.3 01/11/2017 0928   BILITOT 0.60 01/01/2017 1453   BILITOT <0.22 11/12/2015 0945      Impression and Plan: Stuart Hill is a 47 yo gentleman with secondary polycythemia due to heavy smoking. Hct today is 45.8  so we will phlebotomize him.  Again, he has been talking with his trainer who is promoting his use of injectable testosterone. We are adamant that he not use injectable as it will significantly increase his risk of stroke. He verbalized understanding of this.  I spoke with Stuart Hill and we will try the patch.  Stuart Hill is also considering adding an antiplatelet. The patient is allergic to aspirin and Plavix. We will go ahead and plan to see him back in 1 month for follow-up and lab.  He will contact our office with any questions or concerns. We can certainly see him sooner if need be.   [name], NP 1/15/20193:34 PM

## 2017-02-09 NOTE — Patient Instructions (Signed)

## 2017-02-09 NOTE — Progress Notes (Signed)
Stuart Hill presents today for phlebotomy per MD orders. Phlebotomy procedure started at 1618 and ended at 1628 . 529 cc removed via 16 G needle at R antecubital site. Patient tolerated procedure well.

## 2017-02-10 LAB — IRON AND TIBC
IRON: 83 ug/dL (ref 42–163)
Saturation Ratios: 23 % — ABNORMAL LOW (ref 42–163)
TIBC: 355 ug/dL (ref 202–409)
UIBC: 272 ug/dL

## 2017-02-10 LAB — TESTOSTERONE: Testosterone: 240 ng/dL — ABNORMAL LOW (ref 264–916)

## 2017-02-10 LAB — FERRITIN: FERRITIN: 17 ng/mL — AB (ref 22–316)

## 2017-02-10 MED ORDER — TESTOSTERONE 2 MG/24HR TD PT24: 2.0000 mg | MEDICATED_PATCH | TRANSDERMAL | 0 refills | Status: DC

## 2017-02-11 ENCOUNTER — Telehealth: Payer: Self-pay | Admitting: *Deleted

## 2017-02-11 NOTE — Telephone Encounter (Addendum)
Message relayed to patient. He states he has purchased nicotine patches to start this weekend in an attempt to quit smoking.   ----- Message from Eliezer Bottom, NP sent at 02/10/2017  5:11 PM EST ----- Dr. Marin Olp said absolutely no to testosterone injections while he is smoking due to the high risk of stroke. He was ok with an Androgen patch so we will start out at 2mg /24hours. Andro gel was discontinued (he did not use due to it possibly getting on his wife or kids). He will need to remove the old patch and replace with a new patch once every 24 hours.  Dr. Marin Olp is considering another blood thinner and will let him know once he places the order. The patient states that he is "allergic" to aspirin and Plavix.  Thanks!  Sarah   ----- Message ----- From: Buel Ream, Lab [facility] [place] Sent: 02/09/2017   3:29 PM To: Eliezer Bottom, NP

## 2017-02-15 ENCOUNTER — Ambulatory Visit: Payer: Self-pay | Admitting: Family Medicine

## 2017-02-15 ENCOUNTER — Ambulatory Visit: Payer: Self-pay | Admitting: *Deleted

## 2017-02-15 ENCOUNTER — Encounter: Payer: Self-pay | Admitting: Family Medicine

## 2017-02-15 ENCOUNTER — Ambulatory Visit (INDEPENDENT_AMBULATORY_CARE_PROVIDER_SITE_OTHER): Payer: Self-pay

## 2017-02-15 VITALS — BP 134/82 | HR 70 | Temp 97.4°F | Ht 75.0 in | Wt 193.8 lb

## 2017-02-15 DIAGNOSIS — R05 Cough: Secondary | ICD-10-CM

## 2017-02-15 DIAGNOSIS — R059 Cough, unspecified: Secondary | ICD-10-CM

## 2017-02-15 MED ORDER — PREDNISONE 50 MG PO TABS: ORAL_TABLET | ORAL | 0 refills | Status: DC

## 2017-02-15 MED ORDER — LEVOFLOXACIN 500 MG PO TABS: 500.0000 mg | ORAL_TABLET | Freq: Every day | ORAL | 0 refills | Status: DC

## 2017-02-15 NOTE — Patient Instructions (Signed)
Start the prednisone and levaquin.  Let me know if your symptoms worsen or do not improve in 5-7 days.  Take care, Dr Jerline Pain

## 2017-02-15 NOTE — Telephone Encounter (Signed)
See note

## 2017-02-15 NOTE — Progress Notes (Signed)
   Subjective:  Stuart Hill is a 47 y.o. male who presents today for same-day appointment with a chief complaint of cough.   HPI:  Cough, acute issue Symptoms started several weeks ago, but have worsened within the past week.  He saw his PCP about 6 weeks ago and was treated with a course of amoxicillin for enlarged tonsils.  This did not improve and he was subsequently referred to ENT.  At that time was thought that his symptoms were mostly related to GERD and he was started on Nexium and instructed to follow-up in a few months.  As noted above, his symptoms have significantly worsened within the past week.  Nearly every morning for the past week he has woke up with dark brown colored sputum production.  Sometimes the sputum is so thick that it makes him feel like he is suffocating.  No fevers or chills.  No obvious alleviating or aggravating factors.  ROS: Per HPI  PMH: He reports that he has been smoking cigarettes.  He has a 24.00 pack-year smoking history. he has never used smokeless tobacco. He reports that he drinks about 0.6 oz of alcohol per week. He reports that he does not use drugs.  Objective:  Physical Exam: BP 134/82 (BP Location: Left Arm, Patient Position: Sitting, Cuff Size: Normal)   Pulse 70   Temp (!) 97.4 F (36.3 C) (Oral)   Ht 6\' 3"  (1.905 m)   Wt 193 lb 12.8 oz (87.9 kg)   SpO2 96%   BMI 24.22 kg/m   Gen: NAD, resting comfortably HEENT: TMs with clear effusions bilaterally.  Oropharynx erythematous without exudate.  3+ tonsillar hypertrophy bilaterally. CV: RRR with no murmurs appreciated Pulm: NWOB, CTAB with no crackles, wheezes, or rhonchi  Assessment/Plan:  Cough Chest x-ray without obvious signs of pneumonia-we will await radiology read.  His brown sputum is likely secondary to upper respiratory inflammation/drainage.  He has had symptoms for about 6 weeks despite treatment with amoxicillin and Keflex.  We will start a course of Levaquin today.   Also given his tonsillar hypertrophy, we will also start a prednisone burst.  Encouraged patient to stay well-hydrated.  Encourage patient to follow-up with ENT or his PCP if above treatment does not resolve his symptoms or if symptoms worsen.  Return precautions reviewed.  Follow-up as needed.  Algis Greenhouse. Jerline Pain, MD 02/15/2017 5:02 PM

## 2017-02-15 NOTE — Telephone Encounter (Signed)
Noted.  Patient was seen in the office today. 

## 2017-02-15 NOTE — Telephone Encounter (Signed)
Pt called with numbness in throat that started months ago; seen by Dr Janett Billow Copland on 01/11/18; states that tonsils are huge and was negative for strep and tonsilitis; he states that "his tonsils sits on top of uvula; he has also seen a specialist (Dr Wilburn Cornelia, ENT)" ; now he is coughing up brown secretions that clogs his throat and he can't breath; pt says he is weaning from smoking but has not started using nicotine patches yet; he also "feels like there is something constantly pressing against his throat making it hard to breath"; nurse triage initiated and recommendation made for pt to see physician with ; pt offered and accepted appoint with Dr Dimas Chyle at [facility] at 1600 02/15/17; pt verbalizes understanding; will route to [place] for notification of this upcoming appointment; will also notifiy [place] of cancellation of appointment today with Dr Pamella Pert.      Reason for Disposition . SEVERE coughing spells (e.g., whooping sound after coughing, vomiting after coughing)  Answer Assessment - Initial Assessment Questions 1. RESPIRATORY STATUS: "Describe your breathing?" (e.g., wheezing, shortness of breath, unable to speak, severe coughing)      coughing 2. ONSET: "When did this breathing problem begin?"      Months ago 3. PATTERN "Does the difficult breathing come and go, or has it been constant since it started?"      Worsening this past week 4. SEVERITY: "How bad is your breathing?" (e.g., mild, moderate, severe)    - MILD: No SOB at rest, mild SOB with walking, speaks normally in sentences, can lay down, no retractions, pulse < 100.    - MODERATE: SOB at rest, SOB with minimal exertion and prefers to sit, cannot lie down flat, speaks in phrases, mild retractions, audible wheezing, pulse 100-120.    - SEVERE: Very SOB at rest, speaks in single words, struggling to breathe, sitting hunched forward, retractions, pulse > 120      Worse with exertion 5.  RECURRENT SYMPTOM: "Have you had difficulty breathing before?" If so, ask: "When was the last time?" and "What happened that time?"      Yes; coughing fits 6. CARDIAC HISTORY: "Do you have any history of heart disease?" (e.g., heart attack, angina, bypass surgery, angioplasty)      Polycythemia  7. LUNG HISTORY: "Do you have any history of lung disease?"  (e.g., pulmonary embolus, asthma, emphysema)     Polycythemia; pt is a smoker 8. CAUSE: "What do you think is causing the breathing problem?"     Enlarged tonisils 9. OTHER SYMPTOMS: "Do you have any other symptoms? (e.g., dizziness, runny nose, cough, chest pain, fever)     no 10. PREGNANCY: "Is there any chance you are pregnant?" "When was your last menstrual period?"       no 11. TRAVEL: "Have you traveled out of the country in the last month?" (e.g., travel history, exposures)       no  Answer Assessment - Initial Assessment Questions 1. ONSET: "When did the throat start hurting?" (Hours or days ago)      Weeks ago 2. SEVERITY: "How bad is the sore throat?" (Scale 1-10; mild, moderate or severe)   - MILD (1-3):  doesn't interfere with eating or normal activities   - MODERATE (4-7): interferes with eating some solids and normal activities   - SEVERE (8-10):  excruciating pain, interferes with most normal activities   - SEVERE DYSPHAGIA: can't swallow liquids, drooling  moderate 3. STREP EXPOSURE: "Has there been any exposure to strep within the past week?" If so, ask: "What type of contact occurred?"      no 4.  VIRAL SYMPTOMS: "Are there any symptoms of a cold, such as a runny nose, cough, hoarse voice or red eyes?"      no 5. FEVER: "Do you have a fever?" If so, ask: "What is your temperature, how was it measured, and when did it start?"     no 6. PUS ON THE TONSILS: "Is there pus on the tonsils in the back of your throat?"     no 7. OTHER SYMPTOMS: "Do you have any other symptoms?" (e.g., difficulty breathing, headache,  rash) Tonsils sit on top of uvula sometimes      8. PREGNANCY: "Is there any chance you are pregnant?" "When was your last menstrual period?"     n/a  Answer Assessment - Initial Assessment Questions 1. ONSET: "When did the cough begin?"      Weeks ago but worsening since 02/09/17 2. SEVERITY: "How bad is the cough today?"      moderate 3. RESPIRATORY DISTRESS: "Describe your breathing."      Tonsils huge 4. FEVER: "Do you have a fever?" If so, ask: "What is your temperature, how was it measured, and when did it start?"     no 5. SPUTUM: "Describe the color of your sputum" (clear, white, yellow, green)     Brown, gel-like 6. HEMOPTYSIS: "Are you coughing up any blood?" If so ask: "How much?" (flecks, streaks, tablespoons, etc.)     no 7. CARDIAC HISTORY: "Do you have any history of heart disease?" (e.g., heart attack, congestive heart failure)      no 8. LUNG HISTORY: "Do you have any history of lung disease?"  (e.g., pulmonary embolus, asthma, emphysema)     no 9. PE RISK FACTORS: "Do you have a history of blood clots?" (or: recent major surgery, recent prolonged travel, bedridden )     Polycythemia vera 10. OTHER SYMPTOMS: "Do you have any other symptoms?" (e.g., runny nose, wheezing, chest pain)       no 11. PREGNANCY: "Is there any chance you are pregnant?" "When was your last menstrual period?"       n/a 12. TRAVEL: "Have you traveled out of the country in the last month?" (e.g., travel history, exposures)       no  Protocols used: COUGH - ACUTE PRODUCTIVE-A-AH, BREATHING DIFFICULTY-A-AH, SORE THROAT-A-AH

## 2017-02-18 ENCOUNTER — Other Ambulatory Visit: Payer: Self-pay | Admitting: Family Medicine

## 2017-02-18 DIAGNOSIS — F411 Generalized anxiety disorder: Secondary | ICD-10-CM

## 2017-02-18 NOTE — Telephone Encounter (Signed)
Requesting: ALPRAZolam (XANAX) 1 MG tablet Contract UDS Last OV: 01/11/17 Last Refill: 11/09/16  Please Advise

## 2017-02-22 ENCOUNTER — Telehealth: Payer: Self-pay | Admitting: Family Medicine

## 2017-02-22 NOTE — Telephone Encounter (Signed)
Copied from [place]. Topic: Quick Communication - See Telephone Encounter >> Feb 22, 2017 11:27 AM Robina Ade, Helene Kelp D wrote: CRM for notification. See Telephone encounter for: 02/22/17. Patient called and would like to talk to Dr. Lorelei Pont or her CMA about the medication predniSONE (DELTASONE) 50 MG tablet. He said since he has been it he has felt good and been able to do things. He wants to know if he can continue on it and just alternate weeks or each month to be able to be on it. Please call patient back.

## 2017-02-23 NOTE — Telephone Encounter (Signed)
Copied from [place]. Topic: Quick Communication - See Telephone Encounter >> Feb 22, 2017 11:27 AM Robina Ade, Helene Kelp D wrote: CRM for notification. See Telephone encounter for: 02/22/17. Patient called and would like to talk to Dr. Lorelei Pont or her CMA about the medication predniSONE (DELTASONE) 50 MG tablet. He said since he has been it he has felt good and been able to do things. He wants to know if he can continue on it and just alternate weeks or each month to be able to be on it. Please call patient back. >> Feb 23, 2017  4:15 PM Boyd Kerbs wrote: Asking to please call him regarding this.  Has questions,  Can he use one week on one week off?   Called him 1/29 and [facility]-  Should not take prednisone long term due to risk of severe side effects

## 2017-02-23 NOTE — Telephone Encounter (Signed)
Norvel Richards, please give him a call back.  Please let him know that steroids like prednisone can have this sort of effect, but they are not safe for long term use.  We avoid using them long term in all but very rare cases (such as uncontrolled asthma) Thank you!

## 2017-03-08 ENCOUNTER — Telehealth: Payer: Self-pay | Admitting: Family Medicine

## 2017-03-08 NOTE — Telephone Encounter (Signed)
Patient needs to follow up with his PCP.   Stuart Hill. Jerline Pain, MD 03/08/2017 1:16 PM

## 2017-03-08 NOTE — Telephone Encounter (Signed)
Please advise.   Copied from [place]. Topic: General - Other >> Mar 08, 2017 11:17 AM Marin Olp L wrote: Reason for CRM: Patient wants to know if additional medication/antibiotics and steroids can be called in by Dr. Jerline Pain b/c his infection has not cleared up. He said Dr. Jerline Pain told him to let him know if it didn't improve. He see's Dr. Lorelei Pont but saw Jerline Pain on 02/15/2017 for swollen tonsils. Please advise. Okay to leave vmail w/ instructions.

## 2017-03-08 NOTE — Telephone Encounter (Signed)
Left detailed message on personal voicemail that Dr. Jerline Pain can not prescribe anymore medications at this time need to follow up with your PCP.

## 2017-03-08 NOTE — Telephone Encounter (Signed)
Please see message and advise 

## 2017-03-09 ENCOUNTER — Encounter: Payer: Self-pay | Admitting: Internal Medicine

## 2017-03-09 ENCOUNTER — Encounter: Payer: Self-pay | Admitting: Family

## 2017-03-09 ENCOUNTER — Ambulatory Visit (INDEPENDENT_AMBULATORY_CARE_PROVIDER_SITE_OTHER): Payer: Self-pay | Admitting: Internal Medicine

## 2017-03-09 ENCOUNTER — Inpatient Hospital Stay: Payer: Self-pay

## 2017-03-09 ENCOUNTER — Inpatient Hospital Stay: Payer: Self-pay | Attending: Family | Admitting: Family

## 2017-03-09 ENCOUNTER — Inpatient Hospital Stay (HOSPITAL_BASED_OUTPATIENT_CLINIC_OR_DEPARTMENT_OTHER): Payer: Self-pay | Admitting: Hematology & Oncology

## 2017-03-09 ENCOUNTER — Ambulatory Visit (HOSPITAL_BASED_OUTPATIENT_CLINIC_OR_DEPARTMENT_OTHER)
Admission: RE | Admit: 2017-03-09 | Discharge: 2017-03-09 | Disposition: A | Discharge status: Home / Self Care | Payer: Self-pay | Source: Ambulatory Visit | Attending: Internal Medicine | Admitting: Internal Medicine

## 2017-03-09 ENCOUNTER — Encounter (HOSPITAL_BASED_OUTPATIENT_CLINIC_OR_DEPARTMENT_OTHER): Payer: Self-pay

## 2017-03-09 ENCOUNTER — Other Ambulatory Visit: Payer: Self-pay

## 2017-03-09 VITALS — BP 116/70 | HR 70 | Temp 97.9°F | Resp 14 | Ht 75.0 in | Wt 196.5 lb

## 2017-03-09 VITALS — BP 116/70 | HR 70 | Temp 97.9°F | Resp 14 | Wt 195.5 lb

## 2017-03-09 DIAGNOSIS — E291 Testicular hypofunction: Secondary | ICD-10-CM

## 2017-03-09 DIAGNOSIS — D751 Secondary polycythemia: Secondary | ICD-10-CM

## 2017-03-09 DIAGNOSIS — R7989 Other specified abnormal findings of blood chemistry: Secondary | ICD-10-CM

## 2017-03-09 DIAGNOSIS — J351 Hypertrophy of tonsils: Secondary | ICD-10-CM | POA: Insufficient documentation

## 2017-03-09 DIAGNOSIS — D500 Iron deficiency anemia secondary to blood loss (chronic): Secondary | ICD-10-CM

## 2017-03-09 DIAGNOSIS — G510 Bell's palsy: Secondary | ICD-10-CM | POA: Insufficient documentation

## 2017-03-09 DIAGNOSIS — J359 Chronic disease of tonsils and adenoids, unspecified: Secondary | ICD-10-CM

## 2017-03-09 DIAGNOSIS — Z79899 Other long term (current) drug therapy: Secondary | ICD-10-CM | POA: Insufficient documentation

## 2017-03-09 DIAGNOSIS — R590 Localized enlarged lymph nodes: Secondary | ICD-10-CM | POA: Insufficient documentation

## 2017-03-09 DIAGNOSIS — F17210 Nicotine dependence, cigarettes, uncomplicated: Secondary | ICD-10-CM | POA: Insufficient documentation

## 2017-03-09 DIAGNOSIS — J039 Acute tonsillitis, unspecified: Secondary | ICD-10-CM

## 2017-03-09 LAB — CBC WITH DIFFERENTIAL (CANCER CENTER ONLY)
BASOS ABS: 0 10*3/uL (ref 0.0–0.1)
BASOS PCT: 0 %
Eosinophils Absolute: 0.1 10*3/uL (ref 0.0–0.5)
Eosinophils Relative: 1 %
HCT: 44.3 % (ref 38.7–49.9)
HEMOGLOBIN: 15.1 g/dL (ref 13.0–17.1)
LYMPHS PCT: 25 %
Lymphs Abs: 2.6 10*3/uL (ref 0.9–3.3)
MCH: 30.4 pg (ref 28.0–33.4)
MCHC: 34.1 g/dL (ref 32.0–35.9)
MCV: 89.3 fL (ref 82.0–98.0)
MONOS PCT: 8 %
Monocytes Absolute: 0.8 10*3/uL (ref 0.1–0.9)
Neutro Abs: 7.1 10*3/uL — ABNORMAL HIGH (ref 1.5–6.5)
Neutrophils Relative %: 66 %
Platelet Count: 259 10*3/uL (ref 145–400)
RBC: 4.96 MIL/uL (ref 4.20–5.70)
RDW: 13.7 % (ref 11.1–15.7)
WBC Count: 10.6 10*3/uL — ABNORMAL HIGH (ref 4.0–10.0)

## 2017-03-09 LAB — CMP (CANCER CENTER ONLY)
ALBUMIN: 3.9 g/dL (ref 3.5–5.0)
ALK PHOS: 62 U/L (ref 26–84)
ALT: 22 U/L (ref 10–47)
AST: 24 U/L (ref 11–38)
Anion gap: 6 (ref 5–15)
BILIRUBIN TOTAL: 0.8 mg/dL (ref 0.2–1.6)
BUN: 9 mg/dL (ref 7–22)
CALCIUM: 8.9 mg/dL (ref 8.0–10.3)
CO2: 30 mmol/L (ref 18–33)
CREATININE: 0.9 mg/dL (ref 0.60–1.20)
Chloride: 104 mmol/L (ref 98–108)
Glucose, Bld: 95 mg/dL (ref 73–118)
Potassium: 3.6 mmol/L (ref 3.3–4.7)
Sodium: 140 mmol/L (ref 128–145)
Total Protein: 7.8 g/dL (ref 6.4–8.1)

## 2017-03-09 MED ORDER — IOPAMIDOL (ISOVUE-300) INJECTION 61%
100.0000 mL | Freq: Once | INTRAVENOUS | Status: AC | PRN
  Administered 2017-03-09: 75 mL via INTRAVENOUS

## 2017-03-09 MED ORDER — PREDNISONE 10 MG PO TABS
ORAL_TABLET | ORAL | 0 refills | Status: DC
Start: 2017-03-09 — End: 2017-04-23

## 2017-03-09 NOTE — Progress Notes (Signed)
Subjective:    Patient ID: Stuart Hill, male    DOB: 24-Aug-1970, 47 y.o.   MRN: [id number]  DOS:  03/09/2017 Type of visit - description : Acute, here with his wife, throat problems. Interval history: Extensive chart review: Was seen by PCP 12/28/2016, was noted to have tonsillar enlargements and refer to ENT.  He was also prescribed amoxicillin. Saw ENT 01-27-17: Globus sensation, throat clearing, choking feelings.  DX GERD.  Strict reflux precautions recommended.  Also DC cigarettes. He had some obstructive symptoms and they discussed tonsillectomy.  ENT noted that had a CT angio, neck on 12/22/2016 (done for a question of carotid dissection): no major problems were noted around the tonsils except enlargement. Saw primary care 02/15/2017, chest x-ray no pneumonia, was noted to had previous round of amoxicillin without improvement so was was prescribed Levaquin and prednisone. He felt very well post treatment but symptoms are gradually returning particularly in the last 3 days: Has a choking sensation, feeling suffocated.  No actual sore throat.     Review of Systems   Denies fever, chills. No cough or chest congestion Sinus pain or congestion. He reports severe chronic snoring.  Past Medical History:  Diagnosis Date  . Allergy   . Anxiety   . Arthritis   . Bell's palsy   . Erythrocytosis 04/10/2015  . Hyperlipidemia   . Hypertension   . Polycythemia vera ([place]) 05/03/2015    Past Surgical History:  Procedure Laterality Date  . DENTAL SURGERY    . HAND SURGERY      Social History   Socioeconomic History  . Marital status: Married    Spouse name: Not on file  . Number of children: Not on file  . Years of education: Not on file  . Highest education level: Not on file  Social Needs  . Financial resource strain: Not on file  . Food insecurity - worry: Not on file  . Food insecurity - inability: Not on file  . Transportation needs - medical: Not on file  .  Transportation needs - non-medical: Not on file  Occupational History  . Not on file  Tobacco Use  . Smoking status: Current Every Day Smoker    Packs/day: 1.00    Years: 24.00    Pack years: 24.00    Types: Cigarettes  . Smokeless tobacco: Never Used  Substance and Sexual Activity  . Alcohol use: Yes    Alcohol/week: 0.6 oz    Types: 1 Cans of beer per week    Comment: 6-10 beer a week  . Drug use: No  . Sexual activity: Not on file  Other Topics Concern  . Not on file  Social History Narrative  . Not on file      Allergies as of 03/09/2017      Reactions   Asa [aspirin] Hives   Advil [ibuprofen] Hives   Afrin [nasal Spray] Hives   Antihistamines, Chlorpheniramine-type Hives   Atrovent [ipratropium] Hives   Benadryl [diphenhydramine Hcl] Hives   Buprenorphine Hcl Hives   Codeine Hives   Gastromax [metoclopramide] Hives   Morphine And Related Hives   Oxymetazoline Hives   Plavix [clopidogrel Bisulfate] Other (See Comments)   Confusion, lethargy, slurred speech   [name] [carisoprodol] Hives   Tylenol [acetaminophen] Hives   Vicodin [hydrocodone-acetaminophen] Hives, Swelling, Other (See Comments)   ANGIOEDEMA   Pheniramine Hives, Rash      Medication List        Accurate as of 03/09/17  1:37 PM. Always use your most recent med list.          ALPRAZolam 1 MG tablet Commonly known as:  XANAX TAKE 1 TABLET BY MOUTH THREE TIMES A DAY AS NEEDED FOR ANXIETY   esomeprazole 20 MG capsule Commonly known as:  NEXIUM Take 20 mg by mouth daily at 12 noon.   levofloxacin 500 MG tablet Commonly known as:  LEVAQUIN Take 1 tablet (500 mg total) by mouth daily.   predniSONE 50 MG tablet Commonly known as:  DELTASONE Take 1 pill daily for 5 days.   Testosterone 2 MG/24HR Pt24 Place 2 mg onto the skin daily.          Objective:   Physical Exam  HENT:  Mouth/Throat:     BP 116/70 (BP Location: Right Arm, Patient Position: Sitting, Cuff Size: Normal)   Pulse  70   Temp 97.9 F (36.6 C) (Oral)   Resp 14   Ht 6\' 3"  (1.905 m)   Wt 196 lb 8 oz (89.1 kg)   SpO2 97%   BMI 24.56 kg/m  General:   Well developed, well nourished . NAD.  HEENT:  Normocephalic . Face symmetric, atraumatic. Dentition in poor condition without evidence of abscess Throat: Uvula midline, tonsils are enlarged, worse on the left, the rest of the throat is symmetric. No trismus or drooling however he does seem to have some difficulty swallowing.  No difficulty breathing, no stridor. Neck: Symmetric, no mass or LAD. Lungs:  CTA B Normal respiratory effort, no intercostal retractions, no accessory muscle use. Heart: RRR,  no murmur.  No pretibial edema bilaterally  Skin: Not pale. Not jaundice Neurologic:  alert & oriented X3.  Speech normal, gait appropriate for age and unassisted Psych--  Cognition and judgment appear intact.  Cooperative with normal attention span and concentration.  Behavior appropriate. No anxious or depressed appearing.      Assessment & Plan:    47 year old gentleman with a history of hypogonadism, polycythemia, presents with:  Chronic tonsillitis. History of chronic tonsillitis with obstructive symptoms, he did get better after a round of Levaquin and prednisone (likely d/t steroids), presents today with a feeling of suffocation, I noticed some difficulty managing his saliva although there is no drooling.  Tonsils are asymmetric. I am concerned about the asymmetry and I must rule out a peritonsillar abscess or cellulitis. Will do a CT. If is negative, will prescribe prednisone as a temporary measure allowing him to feel better and see his ENT ASAP; I believe given obstructive symptoms he is in need of tonsillectomy. He has severe snoring, possible sleep apnea, has a potential to improve if a tonsillectomy is performed. Will get a throat culture although an acute bacterial infection is unlikely.

## 2017-03-09 NOTE — Progress Notes (Signed)
Chanetta Marshall presents today for phlebotomy per MD orders. Phlebotomy procedure started at 1550 and ended at 1618 220 grams removed, via 18G to RAC. Flushed IV and resumed at 1620 and ending at 1644 with 380 g removed.  Patient declined to stay for observation. Patient tolerated procedure well. Diet and nutrition offered.  IV remains in place for CT. Patient feels well and is going downstairs with wife to CT now.

## 2017-03-09 NOTE — Progress Notes (Signed)
Patient complains of generalized chronic back pain rating the pain a 3 on the 0 to 10 pain scale. Patient states prednisone helped his back pain when he was taking the medication. [name], NP notified.

## 2017-03-09 NOTE — Patient Instructions (Signed)
Get your CAT scan today  We will call you with the results, if the CAT scan show no abscess will proceed with prednisone.  Please see your ENT doctor ASAP  Go to the ER if severe symptoms, difficulty breathing, wheezing sound from the neck, increased difficulty swallowing.

## 2017-03-09 NOTE — Progress Notes (Signed)
Hematology and Oncology Follow Up Visit  Stuart Hill [id number] 1970-02-03 47 y.o. 03/09/2017   Principle Diagnosis:  Secondary polycythemia JAK2 (-), smoker LEFT Bell's palsy  Current Therapy:   Phlebotomy to maintain hematocrit below 42% Plavix 75 mg po q day- stopped himself and does not want to restart   Interim History:  Stuart Hill is here today with his wife for follow-up. He has what is felt to be an abscess on the left tonsil and will have a CT scan later this evening. He is worried he may need to have surgery. No oral lesions noted on exam. Both tonsils are enlarged, left worse than right.  He denies sore throat but has to clear his throat often. No problems swallowing.  He denies, fever, chills, n/v, cough, rash, itching, dizziness, SOB, chest pain, palpitations, abdominal pain or changes in bowel or bladder habits.  Hct is 44.3.  He states that he has not started using the testosterone patch yet.  No swelling, tenderness, numbness or tingling in his extremities. No c/o pain.  He has maintained a good appetite and is staying well hydrated. Stuart Hill is stable.  He states that he is using a friends generic low dose Viagra. He understands that he it is not safe to take someone else's prescription and will discuss this with his PCP.   ECOG Performance Status: 1 - Symptomatic but completely ambulatory  Medications:  Allergies as of 03/09/2017      Reactions   Asa [aspirin] Hives   Advil [ibuprofen] Hives   Afrin [nasal Spray] Hives   Antihistamines, Chlorpheniramine-type Hives   Atrovent [ipratropium] Hives   Benadryl [diphenhydramine Hcl] Hives   Buprenorphine Hcl Hives   Codeine Hives   Gastromax [metoclopramide] Hives   Morphine And Related Hives   Oxymetazoline Hives   Plavix [clopidogrel Bisulfate] Other (See Comments)   Confusion, lethargy, slurred speech   [name] [carisoprodol] Hives   Tylenol [acetaminophen] Hives   Vicodin [hydrocodone-acetaminophen]  Hives, Swelling, Other (See Comments)   ANGIOEDEMA   Pheniramine Hives, Rash      Medication List        Accurate as of 03/09/17  3:30 PM. Always use your most recent med list.          ALPRAZolam 1 MG tablet Commonly known as:  XANAX TAKE 1 TABLET BY MOUTH THREE TIMES A DAY AS NEEDED FOR ANXIETY   esomeprazole 20 MG capsule Commonly known as:  NEXIUM Take 20 mg by mouth daily at 12 noon.   predniSONE 10 MG tablet Commonly known as:  DELTASONE 4 tablets x 2 days, 3 tabs x 2 days, 2 tabs x 2 days, 1 tab x 2 days   Testosterone 2 MG/24HR Pt24 Place 2 mg onto the skin daily.       Allergies:  Allergies  Allergen Reactions  . Asa [Aspirin] Hives  . Advil [Ibuprofen] Hives  . Afrin [Nasal Spray] Hives  . Antihistamines, Chlorpheniramine-Type Hives  . Atrovent [Ipratropium] Hives  . Benadryl [Diphenhydramine Hcl] Hives  . Buprenorphine Hcl Hives  . Codeine Hives  . Gastromax [Metoclopramide] Hives  . Morphine And Related Hives  . Oxymetazoline Hives  . Plavix [Clopidogrel Bisulfate] Other (See Comments)    Confusion, lethargy, slurred speech  . [name] [Carisoprodol] Hives  . Tylenol [Acetaminophen] Hives  . Vicodin [Hydrocodone-Acetaminophen] Hives, Swelling and Other (See Comments)    ANGIOEDEMA  . Pheniramine Hives and Rash    Past Medical History, Surgical history, Social history, and Family  History were reviewed and updated.  Review of Systems: All other 10 point review of systems is negative.   Physical Exam:  Hill is 195 lb 8 oz (88.7 kg). His oral temperature is 97.9 F (36.6 C). His blood pressure is 116/70 and his pulse is 70. His respiration is 14 and oxygen saturation is 100%.   Wt Readings from Last 3 Encounters:  03/09/17 195 lb 8 oz (88.7 kg)  03/09/17 196 lb 8 oz (89.1 kg)  02/15/17 193 lb 12.8 oz (87.9 kg)    Ocular: Sclerae unicteric, pupils equal, round and reactive to light Ear-nose-throat: Oropharynx clear, dentition fair Lymphatic:  No cervical, supraclavicular or axillary adenopathy Lungs no rales or rhonchi, good excursion bilaterally Heart regular rate and rhythm, no murmur appreciated Abd soft, nontender, positive bowel sounds, no liver or spleen tip palpated on exam, no fluid wave  MSK no focal spinal tenderness, no joint edema Neuro: non-focal, well-oriented, appropriate affect Breasts: Deferred   Lab Results  Component Value Date   WBC 10.6 (H) 03/09/2017   HGB 15.3 01/01/2017   HCT 44.3 03/09/2017   MCV 89.3 03/09/2017   PLT 259 03/09/2017   Lab Results  Component Value Date   FERRITIN 17 (L) 02/09/2017   IRON 83 02/09/2017   TIBC 355 02/09/2017   UIBC 272 02/09/2017   IRONPCTSAT 23 (L) 02/09/2017   Lab Results  Component Value Date   RBC 4.96 03/09/2017   No results found for: KPAFRELGTCHN, LAMBDASER, KAPLAMBRATIO No results found for: IGGSERUM, IGA, IGMSERUM No results found for: Odetta Pink, SPEI   Chemistry      Component Value Date/Time   NA 142 02/09/2017 1504   NA 143 01/01/2017 1453   NA 139 11/12/2015 0945   K 4.2 02/09/2017 1504   K 4.3 01/01/2017 1453   K 3.7 11/12/2015 0945   CL 104 02/09/2017 1504   CL 103 01/01/2017 1453   CO2 30 02/09/2017 1504   CO2 27 01/01/2017 1453   CO2 22 11/12/2015 0945   BUN 11 02/09/2017 1504   BUN 18 01/01/2017 1453   BUN 13.1 11/12/2015 0945   CREATININE 1.10 02/09/2017 1504   CREATININE 1.0 01/01/2017 1453   CREATININE 0.9 11/12/2015 0945      Component Value Date/Time   CALCIUM 9.0 02/09/2017 1504   CALCIUM 8.8 01/01/2017 1453   CALCIUM 8.9 11/12/2015 0945   ALKPHOS 55 02/09/2017 1504   ALKPHOS 44 01/01/2017 1453   ALKPHOS 66 11/12/2015 0945   AST 24 02/09/2017 1504   AST 13 11/12/2015 0945   ALT 28 02/09/2017 1504   ALT 368 (HH) 01/01/2017 1453   ALT 14 11/12/2015 0945   BILITOT 0.6 02/09/2017 1504   BILITOT <0.22 11/12/2015 0945      Impression and Plan: Stuart Hill  is a 47 yo caucasian gentleman with 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 He has had some fatigue.  Hct is 44.3% so he will have a phlebotomy today.  His CT scan to better evaluate his tonsils is scheduled for later today.  He has not started the testosterone patch yet.  We will plan to see him back in another month for lab and follow-up.  He will contact our office with any questions or concerns. We can certainly see him sooner if need be.   [name], NP 2/12/20193:30 PM

## 2017-03-09 NOTE — Progress Notes (Signed)
Pre visit review using our clinic review tool, if applicable. No additional management support is needed unless otherwise documented below in the visit note. 

## 2017-03-10 LAB — STREP A DNA PROBE: GROUP A STREP PROBE: NOT DETECTED

## 2017-03-10 LAB — IRON AND TIBC
Iron: 61 ug/dL (ref 42–163)
SATURATION RATIOS: 15 % — AB (ref 42–163)
TIBC: 400 ug/dL (ref 202–409)
UIBC: 339 ug/dL

## 2017-03-10 LAB — VITAMIN D 25 HYDROXY (VIT D DEFICIENCY, FRACTURES): Vit D, 25-Hydroxy: 23.2 ng/mL — ABNORMAL LOW (ref 30.0–100.0)

## 2017-03-10 LAB — TESTOSTERONE: Testosterone: 447 ng/dL (ref 264–916)

## 2017-03-10 LAB — FERRITIN: Ferritin: 23 ng/mL (ref 22–316)

## 2017-03-11 ENCOUNTER — Other Ambulatory Visit: Payer: Self-pay | Admitting: Family

## 2017-03-11 ENCOUNTER — Telehealth: Payer: Self-pay | Admitting: *Deleted

## 2017-03-11 DIAGNOSIS — E559 Vitamin D deficiency, unspecified: Secondary | ICD-10-CM

## 2017-03-11 MED ORDER — ERGOCALCIFEROL 1.25 MG (50000 UT) PO CAPS: 50000.0000 [IU] | ORAL_CAPSULE | ORAL | 6 refills | Status: DC

## 2017-03-11 NOTE — Telephone Encounter (Signed)
-----   Message from Eliezer Bottom, NP sent at 03/11/2017 10:32 AM EST ----- Regarding: Vit D Vitamin D is low. This could certainly be contributing to his fatigue. I sent a prescription to [facility] for a 50,000 unit once a week supplement. Thank you!  Sarah  ----- Message ----- From: Buel Ream, [facility] [place] Sent: 03/09/2017   3:22 PM To: Eliezer Bottom, NP

## 2017-03-15 ENCOUNTER — Telehealth: Payer: Self-pay | Admitting: Family Medicine

## 2017-03-15 NOTE — Telephone Encounter (Signed)
error 

## 2017-03-17 NOTE — Progress Notes (Signed)
[place] [facility] [address] [place], [place] [address] [phone]  Date:  03/18/2017   Name:  Stuart Hill   DOB:  [birth year]   MRN:  [id number]  PCP:  Stuart Mclean, MD    Chief Complaint: Follow-up (Pt here for f/u visit. )   History of Present Illness:  Stuart Hill is a 47 y.o. very pleasant male patient who presents with the following:  Following up from recent visit with Stuart Hill- 2/12  Chronic tonsillitis. History of chronic tonsillitis with obstructive symptoms, he did get better after a round of Levaquin and prednisone (likely d/t steroids), presents today with a feeling of suffocation, I noticed some difficulty managing his saliva although there is no drooling.  Tonsils are asymmetric. I am concerned about the asymmetry and I must rule out a peritonsillar abscess or cellulitis. Will do a CT. If is negative, will prescribe prednisone as a temporary measure allowing him to feel better and see his ENT ASAP; I believe given obstructive symptoms he is in need of tonsillectomy. He has severe snoring, possible sleep apnea, has a potential to improve if a tonsillectomy is performed. Will get a throat culture although an acute bacterial infection is unlikely. Ct Soft Tissue Neck W Contrast  Result Date: 03/09/2017 CLINICAL DATA:  Tonsillar swelling and difficulty swallowing. EXAM: CT NECK WITH CONTRAST TECHNIQUE: Multidetector CT imaging of the neck was performed using the standard protocol following the bolus administration of intravenous contrast. CONTRAST:  87mL ISOVUE-300 IOPAMIDOL (ISOVUE-300) INJECTION 61% COMPARISON:  CTA head neck 12/14/2016 FINDINGS: Pharynx and larynx: --Nasopharynx: Fossae of Rosenmuller are clear. Normal adenoid tonsils for age. --Oral cavity and oropharynx: The palatine tonsils remain markedly enlarged. The lingual tonsils are also enlarged, unchanged. Oral cavity, floor of mouth and  base of tongue are normal. Left posterior maxillary molar with large dental carie. --Hypopharynx: Normal vallecula and pyriform sinuses. --Larynx: Normal epiglottis and pre-epiglottic space. Normal aryepiglottic and vocal folds. --Retropharyngeal space: No abscess, effusion or lymphadenopathy. Salivary glands: --Parotid: No mass lesion or inflammation. No sialolithiasis or ductal dilatation. --Submandibular: Symmetric without inflammation. No sialolithiasis or ductal dilatation. --Sublingual: Normal. No ranula or other visible lesion of the base of tongue and floor of mouth. Thyroid: Normal. Lymph nodes: Bilateral enlarged level 2A cervical lymph nodes. Right level 2A node measures 12 mm, unchanged (image 59). The left level 2 A node measures 8 mm, unchanged. Left level 2B node measures 10 mm, unchanged. Unchanged left level 1A node measuring 9 mm. Vascular: Major cervical vessels are patent. Limited intracranial: Normal. Visualized orbits: Normal. Mastoids and visualized paranasal sinuses: No fluid levels or advanced mucosal thickening. No mastoid effusion. Skeleton: No bony spinal canal stenosis. No lytic or blastic lesions. Upper chest: Clear. Other: None. IMPRESSION: Unchanged marked enlargement of the palatine and lingual tonsils compared to 12/04/2016. Persistence over this long of an interval increases the likelihood of a neoplastic/lymphoproliferative disorder. Direct visualization and/or histologic sampling is recommended. 1. Unchanged bilateral level 2A and 2B lymphadenopathy. Electronically Signed   By: Stuart Hill M.D.   On: 03/09/2017 17:44   He was seen by [facility] ENT, Stuart Hill, last month.  He did not feel that a tonsillectomy was necessarily indicated.  However, CT findings as above are concerning  [name] notes that he is still having a lot of difficulty with his tonsils.  He will feel like he is choking, and like mucus gets caught in  his throat.  He would like to get a 2nd opinion from ENT He  nearly had a tonsillectomy when he was a child, but then they changed their mind and did not do the procedure at the last second.  He relates a lot of throat and tonsillar infections up until about age 65 or 85; however he then did a lot better until more recently, when his tonsils became problematic again  He wonders about getting an rx for sildenafil.  He got some from a friend, took he thinks 40 mg and felt that it really helped him.   He noted a good quality erection and no adverse SE. His wife Stuart Hill is here today and she concurs.   He is actually not using T right now.  I would certainly rather him use prn sildenafil than T given his polycythemia.  Will rx sildenafil, discussed use and possible SE to watch for Patient Active Problem List   Diagnosis Date Noted  . 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 Polycythemia vera ([place]) 05/03/2015  . Hemoptysis 04/03/2015  . Condylomata acuminata 01/18/2015  . Generalized anxiety disorder 03/05/2014    Past Medical History:  Diagnosis Date  . Allergy   . Anxiety   . Arthritis   . Bell's palsy   . Erythrocytosis 04/10/2015  . Hyperlipidemia   . Hypertension   . 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 DENTAL SURGERY    . HAND SURGERY      Social History   Tobacco Use  . Smoking status: Current Every Day Smoker    Packs/day: 1.00    Years: 24.00    Pack years: 24.00    Types: Cigarettes  . Smokeless tobacco: Never Used  Substance Use Topics  . Alcohol use: Yes    Alcohol/week: 0.6 oz    Types: 1 Cans of beer per week    Comment: 6-10 beer a week  . Drug use: No    Family History  Problem Relation Age of Onset  . COPD Mother   . Fibromyalgia Mother   . Heart disease Father   . Stroke Father   . Depression Brother   . Heart disease Brother   . Heart disease Maternal Grandmother   . Stroke Maternal Grandmother   . Heart disease Maternal Grandfather   . Stroke Maternal  Grandfather   . Heart disease Paternal Grandmother   . Stroke Paternal Grandmother   . Heart disease Paternal Grandfather   . Stroke Paternal Grandfather   . Stroke Paternal Aunt     Allergies  Allergen Reactions  . Asa [Aspirin] Hives  . Advil [Ibuprofen] Hives  . Afrin [Nasal Spray] Hives  . Antihistamines, Chlorpheniramine-Type Hives  . Atrovent [Ipratropium] Hives  . Benadryl [Diphenhydramine Hcl] Hives  . Buprenorphine Hcl Hives  . Codeine Hives  . Gastromax [Metoclopramide] Hives  . Morphine And Related Hives  . Oxymetazoline Hives  . Plavix [Clopidogrel Bisulfate] Other (See Comments)    Confusion, lethargy, slurred speech  . [name] [Carisoprodol] Hives  . Tylenol [Acetaminophen] Hives  . Vicodin [Hydrocodone-Acetaminophen] Hives, Swelling and Other (See Comments)    ANGIOEDEMA  . Pheniramine Hives and Rash    Medication list has been reviewed and updated.  Current Outpatient Medications on File Prior to Visit  Medication Sig Dispense Refill  . ALPRAZolam (XANAX) 1 MG tablet TAKE 1 TABLET BY MOUTH THREE TIMES A DAY AS NEEDED FOR ANXIETY 90 tablet 2  .  ergocalciferol (VITAMIN D2) 50000 units capsule Take 1 capsule (50,000 Units total) by mouth once a week. 8 capsule 6  . esomeprazole (NEXIUM) 20 MG capsule Take 20 mg by mouth daily at 12 noon.    . predniSONE (DELTASONE) 10 MG tablet 4 tablets x 2 days, 3 tabs x 2 days, 2 tabs x 2 days, 1 tab x 2 days 20 tablet 0  . Testosterone 2 MG/24HR PT24 Place 2 mg onto the skin daily. 30 patch 0   No current facility-administered medications on file prior to visit.     Review of Systems:  As per HPI- otherwise negative.   Physical Examination: Vitals:   03/18/17 1324  BP: 122/82  Pulse: 74  Temp: 97.9 F (36.6 C)  SpO2: 98%   Vitals:   03/18/17 1324  Weight: 203 lb 6.4 oz (92.3 kg)  Height: 6\' 3"  (1.905 m)   Body mass index is 25.42 kg/m. Ideal Body Weight: Weight in (lb) to have BMI = 25: 199.6  GEN: WDWN,  NAD, Non-toxic, A & O x 3, looks well HEENT: Atraumatic, Normocephalic. Neck supple. No masses, No LAD.  Bilateral TM wnl, oropharynx displays very large but symmetrical tonsils bilaterally.  PEERL,EOMI.   Ears and Nose: No external deformity. CV: RRR, No M/G/R. No JVD. No thrill. No extra heart sounds. PULM: CTA B, no wheezes, crackles, rhonchi. No retractions. No resp. distress. No accessory muscle use. EXTR: No c/c/e NEURO Normal gait.  PSYCH: Normally interactive. Conversant. Not depressed or anxious appearing.  Calm demeanor.    Assessment and Plan: Erectile dysfunction, unspecified erectile dysfunction type - Plan: sildenafil (REVATIO) 20 MG tablet  Tonsillar hypertrophy - Plan: Ambulatory referral to ENT  Referral to ENT for a 2nd opinion about his tonsils  rx for sildenafil, discussed use of this medication with him.  He will let me know if any concerns He still plans to try adding back testosterone which is currently being rx by his hematology team  Signed Lamar Blinks, MD

## 2017-03-18 ENCOUNTER — Encounter: Payer: Self-pay | Admitting: Family Medicine

## 2017-03-18 ENCOUNTER — Ambulatory Visit (INDEPENDENT_AMBULATORY_CARE_PROVIDER_SITE_OTHER): Payer: Self-pay | Admitting: Family Medicine

## 2017-03-18 VITALS — BP 122/82 | HR 74 | Temp 97.9°F | Ht 75.0 in | Wt 203.4 lb

## 2017-03-18 DIAGNOSIS — N529 Male erectile dysfunction, unspecified: Secondary | ICD-10-CM

## 2017-03-18 DIAGNOSIS — J351 Hypertrophy of tonsils: Secondary | ICD-10-CM

## 2017-03-18 MED ORDER — SILDENAFIL CITRATE 20 MG PO TABS: ORAL_TABLET | ORAL | 5 refills | Status: DC

## 2017-03-18 NOTE — Patient Instructions (Signed)
Good to see you today- I will set up a second opinion from ENT for you to discuss your tonsils. Let me know if any concerns otherwise here  I sent in an rx for the generic viagra- sildenafil 20mg .  Take 1-3 pills as needed prior to sexual activity. If any concerning side effects please do let me know.

## 2017-03-19 ENCOUNTER — Encounter: Payer: Self-pay | Admitting: Family Medicine

## 2017-03-27 ENCOUNTER — Telehealth: Payer: Self-pay | Admitting: Family Medicine

## 2017-03-27 MED ORDER — PREDNISONE 20 MG PO TABS: ORAL_TABLET | ORAL | 0 refills | Status: DC

## 2017-03-27 NOTE — Telephone Encounter (Signed)
After hours on call nurse called concerning patient. Pt has been having difficulties with enlarged tender tonsils. He has bene referred to ENT and awaiting appt. He was provided with prednisone by  His PCP to help with symptoms, however he reports he has not been taking it as prescribed because he was attmepting to make it last longer. He is out of prednisone and feels his tonsil has enlarged again. Reviewed PCP note. Agreed to prescribe prednisone taper, take as directed. Follow up with PCP or ENT. If symptoms worsen acutely he should be seen in the ED.

## 2017-03-27 NOTE — Telephone Encounter (Signed)
Thank you :)

## 2017-04-06 ENCOUNTER — Ambulatory Visit: Payer: Self-pay | Admitting: Family

## 2017-04-06 ENCOUNTER — Other Ambulatory Visit: Payer: Self-pay

## 2017-04-12 ENCOUNTER — Inpatient Hospital Stay: Payer: Self-pay | Attending: Family

## 2017-04-12 ENCOUNTER — Inpatient Hospital Stay (HOSPITAL_BASED_OUTPATIENT_CLINIC_OR_DEPARTMENT_OTHER): Payer: Self-pay | Admitting: Family

## 2017-04-12 ENCOUNTER — Inpatient Hospital Stay: Payer: Self-pay

## 2017-04-12 ENCOUNTER — Other Ambulatory Visit: Payer: Self-pay

## 2017-04-12 ENCOUNTER — Encounter: Payer: Self-pay | Admitting: Family

## 2017-04-12 VITALS — BP 129/68 | HR 78 | Temp 97.8°F | Resp 18 | Wt 201.0 lb

## 2017-04-12 DIAGNOSIS — D751 Secondary polycythemia: Secondary | ICD-10-CM

## 2017-04-12 DIAGNOSIS — Z79899 Other long term (current) drug therapy: Secondary | ICD-10-CM

## 2017-04-12 DIAGNOSIS — G510 Bell's palsy: Secondary | ICD-10-CM

## 2017-04-12 DIAGNOSIS — Z7902 Long term (current) use of antithrombotics/antiplatelets: Secondary | ICD-10-CM | POA: Insufficient documentation

## 2017-04-12 DIAGNOSIS — E559 Vitamin D deficiency, unspecified: Secondary | ICD-10-CM

## 2017-04-12 DIAGNOSIS — F17210 Nicotine dependence, cigarettes, uncomplicated: Secondary | ICD-10-CM

## 2017-04-12 DIAGNOSIS — D500 Iron deficiency anemia secondary to blood loss (chronic): Secondary | ICD-10-CM

## 2017-04-12 DIAGNOSIS — J359 Chronic disease of tonsils and adenoids, unspecified: Secondary | ICD-10-CM

## 2017-04-12 LAB — CMP (CANCER CENTER ONLY)
ALT: 34 U/L (ref 10–47)
AST: 26 U/L (ref 11–38)
Albumin: 3.9 g/dL (ref 3.5–5.0)
Alkaline Phosphatase: 58 U/L (ref 26–84)
Anion gap: 10 (ref 5–15)
BUN: 15 mg/dL (ref 7–22)
CHLORIDE: 101 mmol/L (ref 98–108)
CO2: 31 mmol/L (ref 18–33)
Calcium: 9 mg/dL (ref 8.0–10.3)
Creatinine: 0.8 mg/dL (ref 0.60–1.20)
GLUCOSE: 108 mg/dL (ref 73–118)
POTASSIUM: 4 mmol/L (ref 3.3–4.7)
SODIUM: 142 mmol/L (ref 128–145)
TOTAL PROTEIN: 7.8 g/dL (ref 6.4–8.1)
Total Bilirubin: 0.7 mg/dL (ref 0.2–1.6)

## 2017-04-12 LAB — CBC WITH DIFFERENTIAL (CANCER CENTER ONLY)
BASOS ABS: 0.1 10*3/uL (ref 0.0–0.1)
BASOS PCT: 0 %
EOS ABS: 0 10*3/uL (ref 0.0–0.5)
EOS PCT: 0 %
HCT: 42.6 % (ref 38.7–49.9)
Hemoglobin: 14.3 g/dL (ref 13.0–17.1)
LYMPHS ABS: 1.6 10*3/uL (ref 0.9–3.3)
Lymphocytes Relative: 10 %
MCH: 30.1 pg (ref 28.0–33.4)
MCHC: 33.6 g/dL (ref 32.0–35.9)
MCV: 89.7 fL (ref 82.0–98.0)
Monocytes Absolute: 0.7 10*3/uL (ref 0.1–0.9)
Monocytes Relative: 5 %
Neutro Abs: 13.5 10*3/uL — ABNORMAL HIGH (ref 1.5–6.5)
Neutrophils Relative %: 85 %
PLATELETS: 312 10*3/uL (ref 145–400)
RBC: 4.75 MIL/uL (ref 4.20–5.70)
RDW: 13.6 % (ref 11.1–15.7)
WBC: 15.9 10*3/uL — AB (ref 4.0–10.0)

## 2017-04-12 MED ORDER — METHYLPREDNISOLONE 4 MG PO TBPK: ORAL_TABLET | ORAL | 0 refills | Status: DC

## 2017-04-12 MED ORDER — LIDOCAINE VISCOUS 2 % MT SOLN: 10.0000 mL | OROMUCOSAL | 2 refills | Status: DC | PRN

## 2017-04-12 NOTE — Progress Notes (Signed)
1 unit phlebotomy performed using a 18 gauge IV set. Patient tolerated well. Nourishment provided. Patient seeing [name], NP after phlebotomy.

## 2017-04-12 NOTE — Patient Instructions (Signed)
Therapeutic Phlebotomy Therapeutic phlebotomy is the controlled removal of blood from a person's body for the purpose of treating a medical condition. The procedure is similar to donating blood. Usually, about a pint (470 mL, or 0.47L) of blood is removed. The average adult has 9-12 pints (4.3-5.7 L) of blood. Therapeutic phlebotomy may be used to treat the following medical conditions:  Hemochromatosis. This is a condition in which the blood contains too much iron.  Polycythemia vera. This is a condition in which the blood contains too many red blood cells.  Porphyria cutanea tarda. This is a disease in which an important part of hemoglobin is not made properly. It results in the buildup of abnormal amounts of porphyrins in the body.  Sickle cell disease. This is a condition in which the red blood cells form an abnormal crescent shape rather than a round shape.  Tell a health care provider about:  Any allergies you have.  All medicines you are taking, including vitamins, herbs, eye drops, creams, and over-the-counter medicines.  Any problems you or family members have had with anesthetic medicines.  Any blood disorders you have.  Any surgeries you have had.  Any medical conditions you have. What are the risks? Generally, this is a safe procedure. However, problems may occur, including:  Nausea or light-headedness.  Low blood pressure.  Soreness, bleeding, swelling, or bruising at the needle insertion site.  Infection.  What happens before the procedure?  Follow instructions from your health care provider about eating or drinking restrictions.  Ask your health care provider about changing or stopping your regular medicines. This is especially important if you are taking diabetes medicines or blood thinners.  Wear clothing with sleeves that can be raised above the elbow.  Plan to have someone take you home after the procedure.  You may have a blood sample taken. What  happens during the procedure?  A needle will be inserted into one of your veins.  Tubing and a collection bag will be attached to that needle.  Blood will flow through the needle and tubing into the collection bag.  You may be asked to open and close your hand slowly and continually during the entire collection.  After the specified amount of blood has been removed from your body, the collection bag and tubing will be clamped.  The needle will be removed from your vein.  Pressure will be held on the site of the needle insertion to stop the bleeding.  A bandage (dressing) will be placed over the needle insertion site. The procedure may vary among health care providers and hospitals. What happens after the procedure?  Your recovery will be assessed and monitored.  You can return to your normal activities as directed by your health care provider. This information is not intended to replace advice given to you by your health care provider. Make sure you discuss any questions you have with your health care provider. Document Released: 06/16/2010 Document Revised: 09/14/2015 Document Reviewed: 01/08/2014 Elsevier Interactive Patient Education  2018 Elsevier Inc.  

## 2017-04-12 NOTE — Progress Notes (Signed)
Hematology and Oncology Follow Up Visit  ELISHUA RADFORD [id number] 22-Nov-1970 47 y.o. 04/12/2017   Principle Diagnosis:  Secondary polycythemia JAK2 (-), smoker LEFT Bell's palsy  Current Therapy:   Phlebotomy to maintain hematocrit below 42% Plavix 75 mg po q day- stoppedhimself and does not want to restart   Interim History:  Mr. Pascal is here today with his wife for follow-up and phlebotomy. Hct is 42.6 so we will proceed with phlebotomy.  He is really struggling with swallowing and feels like he is "suffocating" trying to get his breath at times. His tonsils are substantially larger this visit with narrowing of his airway. His wife states that his snoring is terrible and he is not resting well. He is not in any visible distress at this time but is repeatedly clearing his throat.  He has had some dark tarry sputum when clearing his throat. He states that he is smoking 25-30 cigarettes a day. He has palpable bilateral submandibular adenopathy. He denies pain with this.  He is currently on a Prednisone taper for the swelling. This has him feel "edgy and aggressive" causing him to snap at his family and coworkers. He finishes in 2 days.   CT of the neck last month showed marked enlargement of the palatine and lingual tonsils.  No fever, chills, n/v, rash, dizziness, chest pain, palpitations, abdominal pain or changes in bowel or bladder habits.  No episodes of bleeding, no bruising or petechiae.  No swelling, tenderness, numbness or tingling in her extremities.  He has maintained a good appetite and is staying well hydrated. His weight is stable.   ECOG Performance Status: 1 - Symptomatic but completely ambulatory  Medications:  Allergies as of 04/12/2017      Reactions   Asa [aspirin] Hives   Advil [ibuprofen] Hives   Afrin [nasal Spray] Hives   Antihistamines, Chlorpheniramine-type Hives   Atrovent [ipratropium] Hives   Benadryl [diphenhydramine Hcl] Hives   Buprenorphine Hcl Hives   Codeine Hives   Gastromax [metoclopramide] Hives   Morphine And Related Hives   Oxymetazoline Hives   Plavix [clopidogrel Bisulfate] Other (See Comments)   Confusion, lethargy, slurred speech   [name] [carisoprodol] Hives   Tylenol [acetaminophen] Hives   Vicodin [hydrocodone-acetaminophen] Hives, Swelling, Other (See Comments)   ANGIOEDEMA   Pheniramine Hives, Rash      Medication List        Accurate as of 04/12/17  3:31 PM. Always use your most recent med list.          ALPRAZolam 1 MG tablet Commonly known as:  XANAX TAKE 1 TABLET BY MOUTH THREE TIMES A DAY AS NEEDED FOR ANXIETY   ergocalciferol 50000 units capsule Commonly known as:  VITAMIN D2 Take 1 capsule (50,000 Units total) by mouth once a week.   esomeprazole 20 MG capsule Commonly known as:  NEXIUM Take 20 mg by mouth daily at 12 noon.   predniSONE 10 MG tablet Commonly known as:  DELTASONE 4 tablets x 2 days, 3 tabs x 2 days, 2 tabs x 2 days, 1 tab x 2 days   predniSONE 20 MG tablet Commonly known as:  DELTASONE 60 mg x3d, 40 mg x3d, 20 mg x2d, 10 mg x2d   sildenafil 20 MG tablet Commonly known as:  REVATIO Take 20- 60 mg po prior to sexual activity as needed   Testosterone 2 MG/24HR Pt24 Place 2 mg onto the skin daily.       Allergies:  Allergies  Allergen Reactions  .  Asa [Aspirin] Hives  . Advil [Ibuprofen] Hives  . Afrin [Nasal Spray] Hives  . Antihistamines, Chlorpheniramine-Type Hives  . Atrovent [Ipratropium] Hives  . Benadryl [Diphenhydramine Hcl] Hives  . Buprenorphine Hcl Hives  . Codeine Hives  . Gastromax [Metoclopramide] Hives  . Morphine And Related Hives  . Oxymetazoline Hives  . Plavix [Clopidogrel Bisulfate] Other (See Comments)    Confusion, lethargy, slurred speech  . [name] [Carisoprodol] Hives  . Tylenol [Acetaminophen] Hives  . Vicodin [Hydrocodone-Acetaminophen] Hives, Swelling and Other (See Comments)    ANGIOEDEMA  . Pheniramine Hives  and Rash    Past Medical History, Surgical history, Social history, and Family History were reviewed and updated.  Review of Systems: All other 10 point review of systems is negative.   Physical Exam:  vitals were not taken for this visit.   Wt Readings from Last 3 Encounters:  03/18/17 203 lb 6.4 oz (92.3 kg)  03/09/17 195 lb 8 oz (88.7 kg)  03/09/17 196 lb 8 oz (89.1 kg)    Ocular: Sclerae unicteric, pupils equal, round and reactive to light Ear-nose-throat: Tonsils significantly enlarged and red, no lesions, dentition fair Lymphatic: Bilateral submandibular adenopathy noted, no cervical or supraclavicular adenopathy Lungs no rales or rhonchi, good excursion bilaterally Heart regular rate and rhythm, no murmur appreciated Abd soft, nontender, positive bowel sounds, no liver or spleen tip palpated on exam, no fluid wave  MSK no focal spinal tenderness, no joint edema Neuro: non-focal, well-oriented, appropriate affect Breasts: Deferred   Lab Results  Component Value Date   WBC 15.9 (H) 04/12/2017   HGB 15.3 01/01/2017   HCT 42.6 04/12/2017   MCV 89.7 04/12/2017   PLT 312 04/12/2017   Lab Results  Component Value Date   FERRITIN 23 03/09/2017   IRON 61 03/09/2017   TIBC 400 03/09/2017   UIBC 339 03/09/2017   IRONPCTSAT 15 (L) 03/09/2017   Lab Results  Component Value Date   RBC 4.75 04/12/2017   No results found for: KPAFRELGTCHN, LAMBDASER, KAPLAMBRATIO No results found for: IGGSERUM, IGA, IGMSERUM No results found for: Odetta Pink, SPEI   Chemistry      Component Value Date/Time   NA 140 03/09/2017 1315   NA 143 01/01/2017 1453   NA 139 11/12/2015 0945   K 3.6 03/09/2017 1315   K 4.3 01/01/2017 1453   K 3.7 11/12/2015 0945   CL 104 03/09/2017 1315   CL 103 01/01/2017 1453   CO2 30 03/09/2017 1315   CO2 27 01/01/2017 1453   CO2 22 11/12/2015 0945   BUN 9 03/09/2017 1315   BUN 18 01/01/2017 1453    BUN 13.1 11/12/2015 0945   CREATININE 0.90 03/09/2017 1315   CREATININE 1.0 01/01/2017 1453   CREATININE 0.9 11/12/2015 0945      Component Value Date/Time   CALCIUM 8.9 03/09/2017 1315   CALCIUM 8.8 01/01/2017 1453   CALCIUM 8.9 11/12/2015 0945   ALKPHOS 62 03/09/2017 1315   ALKPHOS 44 01/01/2017 1453   ALKPHOS 66 11/12/2015 0945   AST 24 03/09/2017 1315   AST 13 11/12/2015 0945   ALT 22 03/09/2017 1315   ALT 368 (HH) 01/01/2017 1453   ALT 14 11/12/2015 0945   BILITOT 0.8 03/09/2017 1315   BILITOT <0.22 11/12/2015 0945      Impression and Plan: Ms. Swamy is a 47 yo caucasian gentleman with secondary polycythemia due to heavy smoking. Hct today is 42.6 so we did proceed with phlebotomy.  His main issue at this time is the significant increase in enlargement of his tonsils. His symptoms have also worsened as mentioned above. Dr. Marin Olp was also able to take a look during his visit today. With his history of heavy smoking it is our opinion that his tonsils be removed as soon as possible and pathology sent to rule out malignancy. Note will be routed to ENT Dr. Wilburn Cornelia.  He was also given viscous lidocaine as well as a medrol dose pack to start once he finishes his Prednisone taper.  We will plan to see him back in a month for follow-up.  He will contact our office with any questions or concerns and go to the ED in the event of an emergency.   [name], NP 3/18/20193:31 PM

## 2017-04-13 LAB — IRON AND TIBC
Iron: 38 ug/dL — ABNORMAL LOW (ref 42–163)
SATURATION RATIOS: 9 % — AB (ref 42–163)
TIBC: 420 ug/dL — ABNORMAL HIGH (ref 202–409)
UIBC: 383 ug/dL

## 2017-04-13 LAB — TESTOSTERONE: TESTOSTERONE: 319 ng/dL (ref 264–916)

## 2017-04-13 LAB — FERRITIN: FERRITIN: 17 ng/mL — AB (ref 22–316)

## 2017-04-13 LAB — VITAMIN D 25 HYDROXY (VIT D DEFICIENCY, FRACTURES): Vit D, 25-Hydroxy: 32.2 ng/mL (ref 30.0–100.0)

## 2017-04-21 ENCOUNTER — Ambulatory Visit: Payer: Self-pay

## 2017-04-21 NOTE — Telephone Encounter (Signed)
Copied from [place] [id number]. Topic: Referral - Request >> Apr 21, 2017  2:33 PM Arletha Grippe wrote: Reason for CRM: pt is asking for a different referral to ent other than Dr Wilburn Cornelia, he was not please with the care he received. Pt says his tonsils are getting more swollen and pt is having difficulty swallowing and breathing. He says the prednisone is no longer helping.   Sent call to nurse to triage pt, he says he needs new referral to someone new asap.

## 2017-04-21 NOTE — Telephone Encounter (Signed)
[name], if you dont mind would look in to referral and see why the patient has not been contacted yet? I and triage nurse Michaele Offer have advised patient to go to ED for evaluation if shortness of breath and trouble swallowing due to tonsils become worse. See triage for further info.

## 2017-04-21 NOTE — Telephone Encounter (Signed)
Can you check status of referral from 03/17/2017?

## 2017-04-21 NOTE — Telephone Encounter (Signed)
Rec'd phone call from pt.  Reported his tonsils are more swollen, and there is a feeling of pressure in the lower part of throat, just above the collar bone.  Stated he has more labored breathing, due to the amt. of swelling.  Reported he was not pleased with the ENT MD, he was originally referred to, 3 mos. ago.  Stated that he was also referred to have a test for sleep apnea, but does not feel he needs to be eval. for sleep apnea, and stated, "I just need my tonsils taken out.  Reported he had discussed this with Dr. Lorelei Pont, and was to be referred to another ENT specialist.  Stated he saw his Oncologist last week on Mon., and advised his tonsils need to come out ASAP.          Phone call to PCP office; spoke with Mel Almond; advised to send note to office and the Referral Coordinator will work on getting an appt. with ENT, as soon as poss.  Reported that they will contact the pt.    Phone call returned to pt.  Made aware of the plan to have the Referral Coordinator expedite getting him sent to another ENT Coordinator.  Advised to go to the ER if his symptoms worsen.  Pt. Verb. Understanding.    Reason for Disposition . [1] Difficulty breathing AND [2] not severe  Answer Assessment - Initial Assessment Questions 1. ONSET: "When did the throat start hurting?" (Hours or days ago)      Went to ENT 3 mos. ago for evaluation of swollen tonsils; advised to f/u in 3 mos. (sched. next week on 04/27/17) 2. SEVERITY: "How bad is the sore throat?" (Scale 1-10; mild, moderate or severe)   - MILD (1-3):  doesn't interfere with eating or normal activities   - MODERATE (4-7): interferes with eating some solids and normal activities   - SEVERE (8-10):  excruciating pain, interferes with most normal activities   - SEVERE DYSPHAGIA: can't swallow liquids, drooling     Swelling has become severe 3. STREP EXPOSURE: "Has there been any exposure to strep within the past week?" If so, ask: "What type of contact occurred?"    n/a 4.  VIRAL SYMPTOMS: "Are there any symptoms of a cold, such as a runny nose, cough, hoarse voice or red eyes?"      Swollen tonsils 5. FEVER: "Do you have a fever?" If so, ask: "What is your temperature, how was it measured, and when did it start?"     no 6. PUS ON THE TONSILS: "Is there pus on the tonsils in the back of your throat?"     More red and swollen 7. OTHER SYMPTOMS: "Do you have any other symptoms?" (e.g., difficulty breathing, headache, rash)     Feels like the throat is more constricted due to swollen tonsils 8. PREGNANCY: "Is there any chance you are pregnant?" "When was your last menstrual period?"     n/a  Protocols used: SORE THROAT-A-AH

## 2017-04-22 ENCOUNTER — Telehealth: Payer: Self-pay | Admitting: Medical

## 2017-04-22 ENCOUNTER — Ambulatory Visit (INDEPENDENT_AMBULATORY_CARE_PROVIDER_SITE_OTHER): Payer: Self-pay | Admitting: Family Medicine

## 2017-04-22 ENCOUNTER — Encounter: Payer: Self-pay | Admitting: Medical

## 2017-04-22 VITALS — BP 138/80 | HR 63 | Resp 16 | Ht 75.0 in | Wt 199.8 lb

## 2017-04-22 DIAGNOSIS — J351 Hypertrophy of tonsils: Secondary | ICD-10-CM

## 2017-04-22 NOTE — Patient Instructions (Addendum)
You have an appt to see Dr. Wilburn Cornelia for an office visit tomorrow at 3:40 pm  Address: [address] [place], [place], [place] [address]  Phone: [phone]  This is NOT for a sleep study. I will outline your concerns in my note today and fax it over, and will also try to speak with Dr. Chauncey Cruel personally about your concerns.   Please call me if things are not going as planned

## 2017-04-22 NOTE — Telephone Encounter (Signed)
I saw the note about very swollen tonsils, difficulty breathing. This was addressed on 04-12-2017. That note describes moderate to severe presentation. It sound like ED evaluation would be more appopitate. Please call pt and notify. He should be aware I might send him to the ED.  Sent message to Rod Holler.

## 2017-04-22 NOTE — Progress Notes (Signed)
[place] [facility] [address], [place], [place] [address] [phone]  Date:  04/22/2017   Name:  Stuart Hill   DOB:  [birth year]   MRN:  [id number]  PCP:  Darreld Mclean, MD    Chief Complaint: Tonsils Swollen (trouble breath, shortness of breath, lymph nodes swollen, 3 months) and Thyroid Issues (feel slike someone is pushing pressure)   History of Present Illness:  Stuart Hill is a 47 y.o. very pleasant male patient who presents with the following:  He was last here on 2/21 with persistent enlargement of his bilateral tonsils, also see note per Dr. Larose Kells on 2/12. He had seen ENT early this year about his tonsils- at that time they did not think he needed tonsillectomy. However over the last couple of months he has continued to have worsening of his tonsillar enlargement . I had referred him back to ENT at our last visit but he did not get an appt and did not know that he should call me to follow-up.  We seem to have had a break down in our referrals process which we need to address.    He also saw his heme/ onc office on 3/18- to follow-up on his polycythemia. Per their note: Hct is 42.6 so we will proceed with phlebotomy.  He is really struggling with swallowing and feels like he is "suffocating" trying to get his breath at times. His tonsils are substantially larger this visit with narrowing of his airway. His wife states that his snoring is terrible and he is not resting well. He is not in any visible distress at this time but is repeatedly clearing his throat.  He has had some dark tarry sputum when clearing his throat. He states that he is smoking 25-30 cigarettes a day. He has palpable bilateral submandibular adenopathy. He denies pain with this.  He is currently on a Prednisone taper for the swelling. This has him feel "edgy and aggressive" causing him to snap at his family and coworkers. He finishes in 2 days.    CT of the neck last month showed marked enlargement of the palatine and lingual tonsils.  A/P His main issue at this time is the significant increase in enlargement of his tonsils. His symptoms have also worsened as mentioned above. Dr. Marin Olp was also able to take a look during his visit today. With his history of heavy smoking it is our opinion that his tonsils be removed as soon as possible and pathology sent to rule out malignancy. Note will be routed to ENT Dr. Wilburn Cornelia.  He was also given viscous lidocaine as well as a medrol dose pack to start once he finishes his Prednisone taper.   He called the [facility] today and has been getting frustrated as he feels like his needs are not being addressed.  I understand why he is upset, and we talked this over. Explained that he did not get scheduled for an ENT appt as he should have, and this is our issue. Asked him to please be sure to contact me personally if he does not get an expected appt in the future. However we were able to set up a visit with Dr. Claiborne Rigg tomorrow which we much appreciate.  At this point Stuart Hill is desperate to have his tonsils removed.  He is having a hard time swallowing and breathing.     He is not in acute distress or in  an emergency situation, but feels that he cannot go on this way long term.    He has had to use steroids several times recently to control his tonsillar swelling and had concerning findings on CT of his neck as below   CT 03/09/17 CT NECK WITH CONTRAST  TECHNIQUE: Multidetector CT imaging of the neck was performed using the standard protocol following the bolus administration of intravenous contrast.  CONTRAST:  45mL ISOVUE-300 IOPAMIDOL (ISOVUE-300) INJECTION 61%  COMPARISON:  CTA head neck 12/14/2016  FINDINGS: Pharynx and larynx:  --Nasopharynx: Fossae of Rosenmuller are clear. Normal adenoid tonsils for age.  --Oral cavity and oropharynx: The palatine tonsils remain markedly enlarged. The  lingual tonsils are also enlarged, unchanged. Oral cavity, floor of mouth and base of tongue are normal. Left posterior maxillary molar with large dental carie.  --Hypopharynx: Normal vallecula and pyriform sinuses.  --Larynx: Normal epiglottis and pre-epiglottic space. Normal aryepiglottic and vocal folds.  --Retropharyngeal space: No abscess, effusion or lymphadenopathy.  Salivary glands:  --Parotid: No mass lesion or inflammation. No sialolithiasis or ductal dilatation.  --Submandibular: Symmetric without inflammation. No sialolithiasis or ductal dilatation.  --Sublingual: Normal. No ranula or other visible lesion of the base of tongue and floor of mouth.  Thyroid: Normal.  Lymph nodes: Bilateral enlarged level 2A cervical lymph nodes. Right level 2A node measures 12 mm, unchanged (image 59). The left level 2 A node measures 8 mm, unchanged. Left level 2B node measures 10 mm, unchanged. Unchanged left level 1A node measuring 9 mm.  Vascular: Major cervical vessels are patent.  Limited intracranial: Normal.  Visualized orbits: Normal.  Mastoids and visualized paranasal sinuses: No fluid levels or advanced mucosal thickening. No mastoid effusion.  Skeleton: No bony spinal canal stenosis. No lytic or blastic lesions.  Upper chest: Clear.  Other: None.  IMPRESSION: Unchanged marked enlargement of the palatine and lingual tonsils compared to 12/04/2016. Persistence over this long of an interval increases the likelihood of a neoplastic/lymphoproliferative disorder. Direct visualization and/or histologic sampling is recommended.  1. Unchanged bilateral level 2A and 2B lymphadenopathy.  Patient Active Problem List   Diagnosis Date Noted  . 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 Polycythemia vera ([place]) 05/03/2015  . Hemoptysis 04/03/2015  . Condylomata acuminata 01/18/2015  . Generalized anxiety disorder 03/05/2014    Past Medical History:   Diagnosis Date  . Allergy   . Anxiety   . Arthritis   . Bell's palsy   . Erythrocytosis 04/10/2015  . Hyperlipidemia   . Hypertension   . Polycythemia vera ([facility]) 05/03/2015    Past Surgical History:  Procedure Laterality Date  . DENTAL SURGERY    . HAND SURGERY      Social History   Tobacco Use  . Smoking status: Current Every Day Smoker    Packs/day: 1.00    Years: 24.00    Pack years: 24.00    Types: Cigarettes  . Smokeless tobacco: Never Used  Substance Use Topics  . Alcohol use: Yes    Alcohol/week: 0.6 oz    Types: 1 Cans of beer per week    Comment: 6-10 beer a week  . Drug use: No    Family History  Problem Relation Age of Onset  . COPD Mother   . Fibromyalgia Mother   . Heart disease Father   . Stroke Father   . Depression Brother   . Heart disease Brother   . Heart disease Maternal Grandmother   . Stroke Maternal Grandmother   . Heart disease  Maternal Grandfather   . Stroke Maternal Grandfather   . Heart disease Paternal Grandmother   . Stroke Paternal Grandmother   . Heart disease Paternal Grandfather   . Stroke Paternal Grandfather   . Stroke Paternal Aunt     Allergies  Allergen Reactions  . Asa [Aspirin] Hives  . Advil [Ibuprofen] Hives  . Afrin [Nasal Spray] Hives  . Antihistamines, Chlorpheniramine-Type Hives  . Atrovent [Ipratropium] Hives  . Benadryl [Diphenhydramine Hcl] Hives  . Buprenorphine Hcl Hives  . Codeine Hives  . Gastromax [Metoclopramide] Hives  . Morphine And Related Hives  . Oxymetazoline Hives  . Plavix [Clopidogrel Bisulfate] Other (See Comments)    Confusion, lethargy, slurred speech  . [name] [Carisoprodol] Hives  . Tylenol [Acetaminophen] Hives  . Vicodin [Hydrocodone-Acetaminophen] Hives, Swelling and Other (See Comments)    ANGIOEDEMA  . Pheniramine Hives and Rash    Medication list has been reviewed and updated.  Current Outpatient Medications on File Prior to Visit  Medication Sig Dispense Refill  .  ALPRAZolam (XANAX) 1 MG tablet TAKE 1 TABLET BY MOUTH THREE TIMES A DAY AS NEEDED FOR ANXIETY 90 tablet 2  . ergocalciferol (VITAMIN D2) 50000 units capsule Take 1 capsule (50,000 Units total) by mouth once a week. 8 capsule 6  . esomeprazole (NEXIUM) 20 MG capsule Take 20 mg by mouth daily at 12 noon.    . methylPREDNISolone (MEDROL DOSEPAK) 4 MG TBPK tablet Take as directed on package. 21 tablet 0  . predniSONE (DELTASONE) 10 MG tablet 4 tablets x 2 days, 3 tabs x 2 days, 2 tabs x 2 days, 1 tab x 2 days 20 tablet 0  . predniSONE (DELTASONE) 20 MG tablet 60 mg x3d, 40 mg x3d, 20 mg x2d, 10 mg x2d 18 tablet 0  . sildenafil (REVATIO) 20 MG tablet Take 20- 60 mg po prior to sexual activity as needed 30 tablet 5  . lidocaine (XYLOCAINE) 2 % solution Use as directed 10 mLs in the mouth or throat every 4 (four) hours as needed for mouth pain. (Patient not taking: Reported on 04/22/2017) 300 mL 2  . Testosterone 2 MG/24HR PT24 Place 2 mg onto the skin daily. (Patient not taking: Reported on 04/12/2017) 30 patch 0   No current facility-administered medications on file prior to visit.     Review of Systems:  As per HPI- otherwise negative.   Physical Examination: Vitals:   04/22/17 1538  BP: 138/80  Pulse: 63  Resp: 16  SpO2: 96%   Vitals:   04/22/17 1538  Weight: 199 lb 12.8 oz (90.6 kg)  Height: 6\' 3"  (1.905 m)   Body mass index is 24.97 kg/m. Ideal Body Weight: Weight in (lb) to have BMI = 25: 199.6  GEN: WDWN, NAD, Non-toxic, A & O x 3, normal weight HEENT: Atraumatic, Normocephalic. Neck supple. No masses, No LAD. Bilateral TM wnl, no oral lesions, PEERL,EOMI.   Enlarged bilateral tonsils which are nearly touching in the midline. He is currently on oral steroids  Ears and Nose: No external deformity. CV: RRR, No M/G/R. No JVD. No thrill. No extra heart sounds. PULM: CTA B, no wheezes, crackles, rhonchi. No retractions. No resp. distress. No accessory muscle use. EXTR: No  c/c/e NEURO Normal gait.  PSYCH: Normally interactive. Conversant. Not depressed or anxious appearing.  Calm demeanor.  Looks well, but voice is slightly thick today  Assessment and Plan: Enlarged tonsils  Here today for enlarged tonsils. What he needs is to see ENT, but  he was not sure how to make this happen so he came in. We really appreciate Dr. Wilburn Cornelia seeing him tomorrow and pt is relieved to have an appt   Signed Lamar Blinks, MD

## 2017-04-22 NOTE — Telephone Encounter (Signed)
Called patient, Stuart Hill spoke to patient over the phone and agrees to see patient in office for evaluation.

## 2017-04-23 DIAGNOSIS — G4733 Obstructive sleep apnea (adult) (pediatric): Secondary | ICD-10-CM | POA: Insufficient documentation

## 2017-04-23 MED ORDER — PREDNISONE 20 MG PO TABS: ORAL_TABLET | ORAL | 0 refills | Status: DC

## 2017-04-23 NOTE — Telephone Encounter (Signed)
Called Stuart Hill to discuss.  He does not have a surgery date yet but tonsillectomy is planned.  He would like to use some prednisone to reduce swelling in the meantime.  Will rx prednisone 20. He will take 1 tablet daily, 2 if he has to.  To stop will take 10 mg daily for one week then QOD doe one week.  He has been on prednisone off and on for several weeks so explained need to taper

## 2017-04-23 NOTE — Telephone Encounter (Signed)
-----   Message from [place], [place] sent at 04/23/2017  4:49 PM EDT ----- Stuart Hill he call in wanting Prednisone he went to his appointment today he is schedule for his surgery in a week however he stated you wouldn't give him any until after him being seen so he wanted to know if he could get it sent in for the time being for his breathing

## 2017-04-29 ENCOUNTER — Other Ambulatory Visit: Payer: Self-pay

## 2017-04-29 ENCOUNTER — Encounter (HOSPITAL_BASED_OUTPATIENT_CLINIC_OR_DEPARTMENT_OTHER): Payer: Self-pay | Admitting: *Deleted

## 2017-04-29 ENCOUNTER — Other Ambulatory Visit: Payer: Self-pay | Admitting: Otolaryngology

## 2017-04-30 ENCOUNTER — Encounter (HOSPITAL_BASED_OUTPATIENT_CLINIC_OR_DEPARTMENT_OTHER)
Admission: RE | Disposition: A | Discharge status: Home / Self Care | Payer: Self-pay | Source: Ambulatory Visit | Attending: Otolaryngology

## 2017-04-30 ENCOUNTER — Other Ambulatory Visit: Payer: Self-pay

## 2017-04-30 ENCOUNTER — Encounter (HOSPITAL_BASED_OUTPATIENT_CLINIC_OR_DEPARTMENT_OTHER): Payer: Self-pay | Admitting: Certified Registered"

## 2017-04-30 ENCOUNTER — Ambulatory Visit (HOSPITAL_BASED_OUTPATIENT_CLINIC_OR_DEPARTMENT_OTHER): Payer: Self-pay | Admitting: Anesthesiology

## 2017-04-30 ENCOUNTER — Ambulatory Visit (HOSPITAL_BASED_OUTPATIENT_CLINIC_OR_DEPARTMENT_OTHER)
Admission: RE | Admit: 2017-04-30 | Discharge: 2017-04-30 | Disposition: A | Discharge status: Home / Self Care | Payer: Self-pay | Source: Ambulatory Visit | Attending: Otolaryngology | Admitting: Otolaryngology

## 2017-04-30 DIAGNOSIS — Z8249 Family history of ischemic heart disease and other diseases of the circulatory system: Secondary | ICD-10-CM | POA: Insufficient documentation

## 2017-04-30 DIAGNOSIS — Z823 Family history of stroke: Secondary | ICD-10-CM | POA: Insufficient documentation

## 2017-04-30 DIAGNOSIS — Z885 Allergy status to narcotic agent status: Secondary | ICD-10-CM | POA: Insufficient documentation

## 2017-04-30 DIAGNOSIS — J351 Hypertrophy of tonsils: Secondary | ICD-10-CM

## 2017-04-30 DIAGNOSIS — Z8269 Family history of other diseases of the musculoskeletal system and connective tissue: Secondary | ICD-10-CM | POA: Insufficient documentation

## 2017-04-30 DIAGNOSIS — F419 Anxiety disorder, unspecified: Secondary | ICD-10-CM | POA: Insufficient documentation

## 2017-04-30 DIAGNOSIS — E785 Hyperlipidemia, unspecified: Secondary | ICD-10-CM | POA: Insufficient documentation

## 2017-04-30 DIAGNOSIS — M1990 Unspecified osteoarthritis, unspecified site: Secondary | ICD-10-CM | POA: Insufficient documentation

## 2017-04-30 DIAGNOSIS — D751 Secondary polycythemia: Secondary | ICD-10-CM | POA: Insufficient documentation

## 2017-04-30 DIAGNOSIS — Z79899 Other long term (current) drug therapy: Secondary | ICD-10-CM | POA: Insufficient documentation

## 2017-04-30 DIAGNOSIS — Z825 Family history of asthma and other chronic lower respiratory diseases: Secondary | ICD-10-CM | POA: Insufficient documentation

## 2017-04-30 DIAGNOSIS — Z886 Allergy status to analgesic agent status: Secondary | ICD-10-CM | POA: Insufficient documentation

## 2017-04-30 DIAGNOSIS — G510 Bell's palsy: Secondary | ICD-10-CM | POA: Insufficient documentation

## 2017-04-30 DIAGNOSIS — Z888 Allergy status to other drugs, medicaments and biological substances status: Secondary | ICD-10-CM | POA: Insufficient documentation

## 2017-04-30 DIAGNOSIS — F17210 Nicotine dependence, cigarettes, uncomplicated: Secondary | ICD-10-CM | POA: Insufficient documentation

## 2017-04-30 HISTORY — PX: TONSILLECTOMY: SHX5217

## 2017-04-30 LAB — POCT HEMOGLOBIN-HEMACUE: Hemoglobin: 13.3 g/dL (ref 13.0–17.0)

## 2017-04-30 SURGERY — TONSILLECTOMY
Anesthesia: General | Laterality: Bilateral

## 2017-04-30 MED ORDER — DIPHENHYDRAMINE HCL 12.5 MG/5ML PO SYRP: 12.5000 mg | ORAL_SOLUTION | Freq: Four times a day (QID) | ORAL | 0 refills | Status: DC | PRN

## 2017-04-30 MED ORDER — DEXAMETHASONE SODIUM PHOSPHATE 10 MG/ML IJ SOLN
INTRAMUSCULAR | Status: DC | PRN
  Administered 2017-04-30: 10 mg via INTRAVENOUS

## 2017-04-30 MED ORDER — SUGAMMADEX SODIUM 200 MG/2ML IV SOLN
INTRAVENOUS | Status: AC
  Filled 2017-04-30: qty 2

## 2017-04-30 MED ORDER — MORPHINE SULFATE (PF) 2 MG/ML IV SOLN: 2.0000 mg | INTRAVENOUS | Status: DC | PRN

## 2017-04-30 MED ORDER — ROCURONIUM BROMIDE 10 MG/ML (PF) SYRINGE
PREFILLED_SYRINGE | INTRAVENOUS | Status: DC | PRN
  Administered 2017-04-30: 60 mg via INTRAVENOUS

## 2017-04-30 MED ORDER — PANTOPRAZOLE SODIUM 40 MG PO TBEC: 40.0000 mg | DELAYED_RELEASE_TABLET | Freq: Every day | ORAL | Status: DC

## 2017-04-30 MED ORDER — HYDROCODONE-ACETAMINOPHEN 7.5-325 MG/15ML PO SOLN
10.0000 mL | ORAL | Status: DC | PRN
  Administered 2017-04-30: 15 mL via ORAL
  Filled 2017-04-30: qty 15

## 2017-04-30 MED ORDER — CHLORHEXIDINE GLUCONATE CLOTH 2 % EX PADS: 6.0000 | MEDICATED_PAD | Freq: Once | CUTANEOUS | Status: DC

## 2017-04-30 MED ORDER — MIDAZOLAM HCL 2 MG/2ML IJ SOLN
INTRAMUSCULAR | Status: DC | PRN
  Administered 2017-04-30: 2 mg via INTRAVENOUS

## 2017-04-30 MED ORDER — MIDAZOLAM HCL 2 MG/2ML IJ SOLN
INTRAMUSCULAR | Status: AC
  Filled 2017-04-30: qty 2

## 2017-04-30 MED ORDER — HYDRALAZINE HCL 20 MG/ML IJ SOLN
INTRAMUSCULAR | Status: AC
  Filled 2017-04-30: qty 1

## 2017-04-30 MED ORDER — CEFAZOLIN SODIUM-DEXTROSE 2-4 GM/100ML-% IV SOLN
INTRAVENOUS | Status: AC
  Filled 2017-04-30: qty 100

## 2017-04-30 MED ORDER — DEXAMETHASONE SODIUM PHOSPHATE 10 MG/ML IJ SOLN: 10.0000 mg | Freq: Once | INTRAMUSCULAR | Status: DC

## 2017-04-30 MED ORDER — LIDOCAINE 2% (20 MG/ML) 5 ML SYRINGE
INTRAMUSCULAR | Status: DC | PRN
  Administered 2017-04-30: 100 mg via INTRAVENOUS

## 2017-04-30 MED ORDER — LACTATED RINGERS IV SOLN
INTRAVENOUS | Status: DC
  Administered 2017-04-30: 10:00:00 via INTRAVENOUS

## 2017-04-30 MED ORDER — ROCURONIUM BROMIDE 10 MG/ML (PF) SYRINGE
PREFILLED_SYRINGE | INTRAVENOUS | Status: AC
  Filled 2017-04-30: qty 10

## 2017-04-30 MED ORDER — PHENYLEPHRINE 40 MCG/ML (10ML) SYRINGE FOR IV PUSH (FOR BLOOD PRESSURE SUPPORT)
PREFILLED_SYRINGE | INTRAVENOUS | Status: AC
  Filled 2017-04-30: qty 10

## 2017-04-30 MED ORDER — PROPOFOL 10 MG/ML IV BOLUS
INTRAVENOUS | Status: AC
  Filled 2017-04-30: qty 20

## 2017-04-30 MED ORDER — DEXTROSE IN LACTATED RINGERS 5 % IV SOLN
INTRAVENOUS | Status: DC
  Administered 2017-04-30: 13:00:00 via INTRAVENOUS

## 2017-04-30 MED ORDER — ONDANSETRON HCL 4 MG/2ML IJ SOLN
INTRAMUSCULAR | Status: AC
  Filled 2017-04-30: qty 4

## 2017-04-30 MED ORDER — CEFAZOLIN SODIUM-DEXTROSE 2-4 GM/100ML-% IV SOLN
2000.0000 mg | Freq: Once | INTRAVENOUS | Status: AC
  Administered 2017-04-30: 2 g via INTRAVENOUS

## 2017-04-30 MED ORDER — PHENOL 1.4 % MT LIQD: 1.0000 | OROMUCOSAL | Status: DC | PRN

## 2017-04-30 MED ORDER — PREDNISONE 20 MG PO TABS: ORAL_TABLET | ORAL | 0 refills | Status: AC

## 2017-04-30 MED ORDER — HYDROCODONE-ACETAMINOPHEN 7.5-325 MG/15ML PO SOLN: 10.0000 mL | Freq: Four times a day (QID) | ORAL | 0 refills | Status: DC | PRN

## 2017-04-30 MED ORDER — ONDANSETRON HCL 4 MG/2ML IJ SOLN
INTRAMUSCULAR | Status: DC | PRN
  Administered 2017-04-30: 4 mg via INTRAVENOUS

## 2017-04-30 MED ORDER — IBUPROFEN 100 MG/5ML PO SUSP: 400.0000 mg | Freq: Four times a day (QID) | ORAL | Status: DC | PRN

## 2017-04-30 MED ORDER — FENTANYL CITRATE (PF) 100 MCG/2ML IJ SOLN
INTRAMUSCULAR | Status: DC | PRN
  Administered 2017-04-30: 100 ug via INTRAVENOUS
  Administered 2017-04-30 (×2): 50 ug via INTRAVENOUS

## 2017-04-30 MED ORDER — ONDANSETRON HCL 4 MG/2ML IJ SOLN: 4.0000 mg | INTRAMUSCULAR | Status: DC | PRN

## 2017-04-30 MED ORDER — ONDANSETRON HCL 4 MG/2ML IJ SOLN: 4.0000 mg | Freq: Once | INTRAMUSCULAR | Status: DC | PRN

## 2017-04-30 MED ORDER — SCOPOLAMINE 1 MG/3DAYS TD PT72: 1.0000 | MEDICATED_PATCH | Freq: Once | TRANSDERMAL | Status: DC | PRN

## 2017-04-30 MED ORDER — ALPRAZOLAM 1 MG PO TABS: 1.0000 mg | ORAL_TABLET | Freq: Three times a day (TID) | ORAL | Status: DC | PRN

## 2017-04-30 MED ORDER — EPHEDRINE 5 MG/ML INJ
INTRAVENOUS | Status: AC
  Filled 2017-04-30: qty 20

## 2017-04-30 MED ORDER — SUCCINYLCHOLINE CHLORIDE 200 MG/10ML IV SOSY
PREFILLED_SYRINGE | INTRAVENOUS | Status: AC
  Filled 2017-04-30: qty 10

## 2017-04-30 MED ORDER — DEXAMETHASONE SODIUM PHOSPHATE 10 MG/ML IJ SOLN
INTRAMUSCULAR | Status: AC
  Filled 2017-04-30: qty 2

## 2017-04-30 MED ORDER — DEXAMETHASONE SODIUM PHOSPHATE 10 MG/ML IJ SOLN
10.0000 mg | Freq: Once | INTRAMUSCULAR | Status: AC
  Administered 2017-04-30: 10 mg via INTRAVENOUS
  Filled 2017-04-30: qty 1

## 2017-04-30 MED ORDER — FENTANYL CITRATE (PF) 100 MCG/2ML IJ SOLN
INTRAMUSCULAR | Status: AC
  Filled 2017-04-30: qty 2

## 2017-04-30 MED ORDER — PROPOFOL 10 MG/ML IV BOLUS
INTRAVENOUS | Status: DC | PRN
  Administered 2017-04-30: 150 mg via INTRAVENOUS

## 2017-04-30 MED ORDER — SUGAMMADEX SODIUM 200 MG/2ML IV SOLN
INTRAVENOUS | Status: DC | PRN
  Administered 2017-04-30: 200 mg via INTRAVENOUS

## 2017-04-30 MED ORDER — HYDRALAZINE HCL 20 MG/ML IJ SOLN
10.0000 mg | Freq: Once | INTRAMUSCULAR | Status: AC
  Administered 2017-04-30: 10 mg via INTRAVENOUS

## 2017-04-30 MED ORDER — ONDANSETRON HCL 4 MG PO TABS: 4.0000 mg | ORAL_TABLET | ORAL | Status: DC | PRN

## 2017-04-30 MED ORDER — MIDAZOLAM HCL 2 MG/2ML IJ SOLN: 1.0000 mg | INTRAMUSCULAR | Status: DC | PRN

## 2017-04-30 MED ORDER — BACITRACIN ZINC 500 UNIT/GM EX OINT
TOPICAL_OINTMENT | CUTANEOUS | Status: AC
  Filled 2017-04-30: qty 0.9

## 2017-04-30 MED ORDER — MEPERIDINE HCL 25 MG/ML IJ SOLN: 6.2500 mg | INTRAMUSCULAR | Status: DC | PRN

## 2017-04-30 MED ORDER — HYDROMORPHONE HCL 1 MG/ML IJ SOLN
0.2500 mg | INTRAMUSCULAR | Status: DC | PRN
  Administered 2017-04-30: 0.5 mg via INTRAVENOUS

## 2017-04-30 MED ORDER — LIDOCAINE HCL (CARDIAC) 20 MG/ML IV SOLN
INTRAVENOUS | Status: AC
  Filled 2017-04-30: qty 10

## 2017-04-30 MED ORDER — LACTATED RINGERS IV SOLN
INTRAVENOUS | Status: DC | PRN
  Administered 2017-04-30 (×2): via INTRAVENOUS

## 2017-04-30 MED ORDER — HYDROMORPHONE HCL 1 MG/ML IJ SOLN
INTRAMUSCULAR | Status: AC
  Filled 2017-04-30: qty 0.5

## 2017-04-30 MED ORDER — FENTANYL CITRATE (PF) 100 MCG/2ML IJ SOLN: 50.0000 ug | INTRAMUSCULAR | Status: DC | PRN

## 2017-04-30 SURGICAL SUPPLY — 39 items
CANISTER SUCT 1200ML W/VALVE (MISCELLANEOUS) ×3 IMPLANT
CATH ROBINSON RED A/P 10FR (CATHETERS) IMPLANT
COAGULATOR SUCT 6 FR SWTCH (ELECTROSURGICAL) ×1
COAGULATOR SUCT SWTCH 10FR 6 (ELECTROSURGICAL) ×2 IMPLANT
COVER BACK TABLE 60X90IN (DRAPES) ×3 IMPLANT
COVER MAYO STAND STRL (DRAPES) ×3 IMPLANT
ELECT COATED BLADE 2.86 ST (ELECTRODE) ×3 IMPLANT
ELECT REM PT RETURN 9FT ADLT (ELECTROSURGICAL) ×3
ELECT REM PT RETURN 9FT PED (ELECTROSURGICAL)
ELECTRODE REM PT RETRN 9FT PED (ELECTROSURGICAL) IMPLANT
ELECTRODE REM PT RTRN 9FT ADLT (ELECTROSURGICAL) ×1 IMPLANT
GAUZE SPONGE 4X4 12PLY STRL LF (GAUZE/BANDAGES/DRESSINGS) ×3 IMPLANT
GLOVE BIOGEL M 7.0 STRL (GLOVE) ×3 IMPLANT
GLOVE BIOGEL PI IND STRL 7.0 (GLOVE) ×1 IMPLANT
GLOVE BIOGEL PI IND STRL 7.5 (GLOVE) ×1 IMPLANT
GLOVE BIOGEL PI INDICATOR 7.0 (GLOVE) ×2
GLOVE BIOGEL PI INDICATOR 7.5 (GLOVE) ×2
GLOVE SURG SS PI 7.0 STRL IVOR (GLOVE) ×3 IMPLANT
GLOVE SURG SYN 7.5  E (GLOVE) ×2
GLOVE SURG SYN 7.5 E (GLOVE) ×1 IMPLANT
GOWN STRL REUS W/ TWL LRG LVL3 (GOWN DISPOSABLE) ×3 IMPLANT
GOWN STRL REUS W/ TWL XL LVL3 (GOWN DISPOSABLE) IMPLANT
GOWN STRL REUS W/TWL LRG LVL3 (GOWN DISPOSABLE) ×6
GOWN STRL REUS W/TWL XL LVL3 (GOWN DISPOSABLE)
MARKER SKIN DUAL TIP RULER LAB (MISCELLANEOUS) IMPLANT
NS IRRIG 1000ML POUR BTL (IV SOLUTION) ×3 IMPLANT
PENCIL BUTTON HOLSTER BLD 10FT (ELECTRODE) ×3 IMPLANT
PIN SAFETY STERILE (MISCELLANEOUS) IMPLANT
SHEET MEDIUM DRAPE 40X70 STRL (DRAPES) ×3 IMPLANT
SOLUTION BUTLER CLEAR DIP (MISCELLANEOUS) IMPLANT
SPONGE TONSIL 1 RF SGL (DISPOSABLE) IMPLANT
SPONGE TONSIL 1.25 RF SGL STRG (GAUZE/BANDAGES/DRESSINGS) IMPLANT
SYR BULB 3OZ (MISCELLANEOUS) ×3 IMPLANT
TOWEL OR 17X24 6PK STRL BLUE (TOWEL DISPOSABLE) ×3 IMPLANT
TUBE CONNECTING 20'X1/4 (TUBING) ×1
TUBE CONNECTING 20X1/4 (TUBING) ×2 IMPLANT
TUBE SALEM SUMP 12R W/ARV (TUBING) IMPLANT
TUBE SALEM SUMP 16 FR W/ARV (TUBING) ×3 IMPLANT
YANKAUER SUCT BULB TIP NO VENT (SUCTIONS) ×3 IMPLANT

## 2017-04-30 NOTE — Anesthesia Procedure Notes (Signed)
Procedure Name: Intubation Date/Time: 04/30/2017 10:57 AM Performed by: Cynda Familia, CRNA Pre-anesthesia Checklist: Patient identified, Emergency Drugs available, Suction available and Patient being monitored Patient Re-evaluated:Patient Re-evaluated prior to induction Oxygen Delivery Method: Circle System Utilized Preoxygenation: Pre-oxygenation with 100% oxygen Induction Type: IV induction Ventilation: Mask ventilation without difficulty Laryngoscope Size: Miller and 2 Grade View: Grade I Tube type: Oral Tube size: 8.0 mm Number of attempts: 1 Airway Equipment and Method: Stylet and Oral airway Placement Confirmation: ETT inserted through vocal cords under direct vision,  positive ETCO2 and breath sounds checked- equal and bilateral Secured at: 22 cm Tube secured with: Tape Dental Injury: Teeth and Oropharynx as per pre-operative assessment  Comments: Smooth IV induction Ossey-- intubation AM CRNA atraumatic-- teeth and mouth as preop--- chipped front teeth unchanged with laryngoscopy-- bilat BS Ossey

## 2017-04-30 NOTE — Discharge Instructions (Signed)

## 2017-04-30 NOTE — Anesthesia Preprocedure Evaluation (Addendum)
Anesthesia Evaluation  Patient identified by MRN, date of birth, ID band Patient awake    Reviewed: Allergy & Precautions, NPO status , Patient's Chart, lab work & pertinent test results  Airway Mallampati: I  TM Distance: >3 FB Neck ROM: Full    Dental  (+) Dental Advisory Given, Chipped   Pulmonary Current Smoker,    Pulmonary exam normal        Cardiovascular Normal cardiovascular exam     Neuro/Psych Anxiety    GI/Hepatic   Endo/Other    Renal/GU      Musculoskeletal   Abdominal   Peds  Hematology   Anesthesia Other Findings   Reproductive/Obstetrics                            Anesthesia Physical Anesthesia Plan  ASA: II  Anesthesia Plan: General   Post-op Pain Management:    Induction: Intravenous  PONV Risk Score and Plan: 1 and Ondansetron and Treatment may vary due to age or medical condition  Airway Management Planned: Oral ETT  Additional Equipment:   Intra-op Plan:   Post-operative Plan: Extubation in OR  Informed Consent: I have reviewed the patients History and Physical, chart, labs and discussed the procedure including the risks, benefits and alternatives for the proposed anesthesia with the patient or authorized representative who has indicated his/her understanding and acceptance.     Plan Discussed with: CRNA and Surgeon  Anesthesia Plan Comments:         Anesthesia Quick Evaluation

## 2017-04-30 NOTE — Anesthesia Procedure Notes (Signed)
Date/Time: 04/30/2017 11:39 AM Performed by: Cynda Familia, CRNA Oxygen Delivery Method: Simple face mask Placement Confirmation: breath sounds checked- equal and bilateral and positive ETCO2 Dental Injury: Teeth and Oropharynx as per pre-operative assessment

## 2017-04-30 NOTE — Transfer of Care (Signed)
Immediate Anesthesia Transfer of Care Note  Patient: Stuart Hill  Procedure(s) Performed: TONSILLECTOMY (Bilateral )  Patient Location: PACU  Anesthesia Type:General  Level of Consciousness: awake and alert   Airway & Oxygen Therapy: Patient Spontanous Breathing and Patient connected to face mask oxygen  Post-op Assessment: Report given to RN and Post -op Vital signs reviewed and stable  Post vital signs: Reviewed and stable  Last Vitals:  Vitals Value Taken Time  BP    Temp    Pulse 70 04/30/2017 11:51 AM  Resp 10 04/30/2017 11:51 AM  SpO2 100 % 04/30/2017 11:51 AM  Vitals shown include unvalidated device data.  Last Pain:  Vitals:   04/30/17 0958  TempSrc: Oral  PainSc: 0-No pain      Patients Stated Pain Goal: 0 (05/69/79 4801)  Complications: No apparent anesthesia complications

## 2017-04-30 NOTE — Op Note (Signed)
Operative Note: Tonsillectomy  Patient: Stuart Hill record number: [id number]  Date:04/30/2017  Pre-operative Indications: Tonsillar Hypertrophy  Postoperative Indications: Same  Surgical Procedure: Tonsillectomy  Anesthesia: GET  Surgeon: Delsa Bern, M.D.  Complications: None  EBL: 50cc   Brief History: The patient is a 47 y.o. male with a history of recurrent acute tonsillitis and tonsillar hypertrophy. The patient has been on multiple courses of antibiotics for recurrent infection. Based on the patient's history and findings I recommended tonsillectomy under general anesthesia, risks and benefits were discussed in detail with the patient and family. They understand and agree with our plan for surgery which is scheduled on elective basis at [facility].  Surgical Procedure: The patient is brought to the operating room on 04/30/2017 and placed in supine position on the operating table. General endotracheal anesthesia was established without difficulty. When the patient was adequately anesthetized, surgical timeout was performed and correct identification of the patient and the surgical procedure. The patient was positioned and prepped and draped in sterile fashion.  The patient prepared for surgery a Phill Mutter mouth gag was inserted without difficulty there were no loose or broken teeth and the hard soft palate were intact. Tonsillectomy was performed, using Bovie cautery and dissecting a subcapsular fashion the entire left tonsil was removed from superior pole to tongue base. Right tonsil removed in a similar fashion. Tonsillar tissue was sent to pathology for gross and microscopic analysis. The tonsillar fossa were gently abraded with dry tonsil sponge and several small areas of point hemorrhage were cauterized with suction cautery. The Crowe-Davis mouth gag was released and reapplied there is no active bleeding. Oral cavity and nasopharynx were irrigated with saline. An  orogastric tube was passed and stomach contents were aspirated. Mouthgag was removed again no loose or broken teeth and no bleeding. Patient was awakened from anesthetic and transferred from the operating room to the recovery room in stable condition. There were no complications and blood loss was 50 cc.   Delsa Bern, M.D. [facility] 04/30/2017

## 2017-04-30 NOTE — Anesthesia Postprocedure Evaluation (Signed)
Anesthesia Post Note  Patient: Stuart Hill  Procedure(s) Performed: TONSILLECTOMY (Bilateral )     Patient location during evaluation: PACU Anesthesia Type: General Level of consciousness: awake and alert Pain management: pain level controlled Vital Signs Assessment: post-procedure vital signs reviewed and stable Respiratory status: spontaneous breathing, nonlabored ventilation, respiratory function stable and patient connected to nasal cannula oxygen Cardiovascular status: blood pressure returned to baseline and stable Postop Assessment: no apparent nausea or vomiting Anesthetic complications: no    Last Vitals:  Vitals:   04/30/17 1151 04/30/17 1200  BP:  (!) 168/98  Pulse:  (!) 55  Resp:  (!) 9  Temp: (!) 36.4 C   SpO2:  100%    Last Pain:  Vitals:   04/30/17 1151  TempSrc:   PainSc: 7                  Marchelle Rinella DAVID

## 2017-04-30 NOTE — H&P (Signed)
Stuart Hill is an 47 y.o. male.   Chief Complaint: Tonsil hypertrophy HPI: Pt with prog Tonsil hypertrophy, respiratory difficulty  Past Medical History:  Diagnosis Date  . Allergy   . Anxiety   . Arthritis   . Bell's palsy   . Erythrocytosis 04/10/2015  . Hyperlipidemia   . Polycythemia vera ([place]) 05/03/2015    Past Surgical History:  Procedure Laterality Date  . DENTAL SURGERY    . HAND SURGERY      Family History  Problem Relation Age of Onset  . COPD Mother   . Fibromyalgia Mother   . Heart disease Father   . Stroke Father   . Depression Brother   . Heart disease Brother   . Heart disease Maternal Grandmother   . Stroke Maternal Grandmother   . Heart disease Maternal Grandfather   . Stroke Maternal Grandfather   . Heart disease Paternal Grandmother   . Stroke Paternal Grandmother   . Heart disease Paternal Grandfather   . Stroke Paternal Grandfather   . Stroke Paternal Aunt    Social History:  reports that he has been smoking cigarettes.  He has a 24.00 pack-year smoking history. He has never used smokeless tobacco. He reports that he drinks about 0.6 oz of alcohol per week. He reports that he does not use drugs.  Allergies:  Allergies  Allergen Reactions  . Asa [Aspirin] Hives  . Advil [Ibuprofen] Hives  . Afrin [Nasal Spray] Hives  . Antihistamines, Chlorpheniramine-Type Hives  . Atrovent [Ipratropium] Hives  . Benadryl [Diphenhydramine Hcl] Hives  . Buprenorphine Hcl Hives  . Codeine Hives  . Gastromax [Metoclopramide] Hives  . Morphine And Related Hives  . Oxymetazoline Hives  . Plavix [Clopidogrel Bisulfate] Other (See Comments)    Confusion, lethargy, slurred speech  . [name] [Carisoprodol] Hives  . Tylenol [Acetaminophen] Hives  . Vicodin [Hydrocodone-Acetaminophen] Hives, Swelling and Other (See Comments)    ANGIOEDEMA  . Pheniramine Hives and Rash    Medications Prior to Admission  Medication Sig Dispense Refill  . ALPRAZolam (XANAX) 1 MG  tablet TAKE 1 TABLET BY MOUTH THREE TIMES A DAY AS NEEDED FOR ANXIETY 90 tablet 2  . ergocalciferol (VITAMIN D2) 50000 units capsule Take 1 capsule (50,000 Units total) by mouth once a week. 8 capsule 6  . esomeprazole (NEXIUM) 20 MG capsule Take 20 mg by mouth daily at 12 noon.    . predniSONE (DELTASONE) 20 MG tablet Take 1 daily for swollen tonsils. When time to stop use take 1/2 tablet daily for one week then every other day for one week 30 tablet 0  . sildenafil (REVATIO) 20 MG tablet Take 20- 60 mg po prior to sexual activity as needed 30 tablet 5    Results for orders placed or performed during the hospital encounter of 04/30/17 (from the past 48 hour(s))  Hemoglobin-hemacue, POC     Status: None   Collection Time: 04/30/17 10:09 AM  Result Value Ref Range   Hemoglobin 13.3 13.0 - 17.0 g/dL   No results found.  Review of Systems  Constitutional: Negative.   HENT: Negative.   Respiratory: Negative.   Cardiovascular: Negative.     Blood pressure 102/74, pulse (!) 52, temperature 97.9 F (36.6 C), temperature source Oral, resp. rate 18, height 6\' 3"  (1.905 m), weight 89.4 kg (197 lb), SpO2 98 %. Physical Exam  Constitutional: He appears well-developed and well-nourished.  HENT:  Tonsil hypertrophy  Neck: Normal range of motion. Neck supple.  Cardiovascular:  Normal rate.  Respiratory: Effort normal.     Assessment/Plan Adm for OP tonsillectomy  Tyson Parkison, MD 04/30/2017, 10:30 AM

## 2017-05-03 ENCOUNTER — Encounter (HOSPITAL_BASED_OUTPATIENT_CLINIC_OR_DEPARTMENT_OTHER): Payer: Self-pay | Admitting: Otolaryngology

## 2017-05-10 ENCOUNTER — Inpatient Hospital Stay: Payer: Self-pay

## 2017-05-10 ENCOUNTER — Inpatient Hospital Stay: Payer: Self-pay | Admitting: Family

## 2017-05-13 ENCOUNTER — Inpatient Hospital Stay: Payer: Self-pay | Attending: Family

## 2017-05-13 ENCOUNTER — Inpatient Hospital Stay: Payer: Self-pay

## 2017-05-13 ENCOUNTER — Encounter: Payer: Self-pay | Admitting: Family

## 2017-05-13 ENCOUNTER — Other Ambulatory Visit: Payer: Self-pay

## 2017-05-13 ENCOUNTER — Inpatient Hospital Stay (HOSPITAL_BASED_OUTPATIENT_CLINIC_OR_DEPARTMENT_OTHER): Payer: Self-pay | Admitting: Family

## 2017-05-13 VITALS — BP 126/75 | HR 59 | Temp 97.5°F | Resp 17 | Wt 195.0 lb

## 2017-05-13 DIAGNOSIS — D751 Secondary polycythemia: Secondary | ICD-10-CM | POA: Insufficient documentation

## 2017-05-13 DIAGNOSIS — J359 Chronic disease of tonsils and adenoids, unspecified: Secondary | ICD-10-CM

## 2017-05-13 DIAGNOSIS — R7989 Other specified abnormal findings of blood chemistry: Secondary | ICD-10-CM

## 2017-05-13 DIAGNOSIS — Z79899 Other long term (current) drug therapy: Secondary | ICD-10-CM | POA: Insufficient documentation

## 2017-05-13 DIAGNOSIS — D500 Iron deficiency anemia secondary to blood loss (chronic): Secondary | ICD-10-CM

## 2017-05-13 DIAGNOSIS — G510 Bell's palsy: Secondary | ICD-10-CM | POA: Insufficient documentation

## 2017-05-13 LAB — CBC WITH DIFFERENTIAL (CANCER CENTER ONLY)
BASOS ABS: 0.1 10*3/uL (ref 0.0–0.1)
Basophils Relative: 1 %
EOS ABS: 0.1 10*3/uL (ref 0.0–0.5)
EOS PCT: 1 %
HCT: 39.4 % (ref 38.7–49.9)
Hemoglobin: 13.4 g/dL (ref 13.0–17.1)
LYMPHS ABS: 2.6 10*3/uL (ref 0.9–3.3)
LYMPHS PCT: 25 %
MCH: 29.1 pg (ref 28.0–33.4)
MCHC: 34 g/dL (ref 32.0–35.9)
MCV: 85.7 fL (ref 82.0–98.0)
Monocytes Absolute: 1 10*3/uL — ABNORMAL HIGH (ref 0.1–0.9)
Monocytes Relative: 10 %
NEUTROS PCT: 63 %
Neutro Abs: 6.4 10*3/uL (ref 1.5–6.5)
PLATELETS: 294 10*3/uL (ref 145–400)
RBC: 4.6 MIL/uL (ref 4.20–5.70)
RDW: 13.6 % (ref 11.1–15.7)
WBC: 10.1 10*3/uL — AB (ref 4.0–10.0)

## 2017-05-13 LAB — CMP (CANCER CENTER ONLY)
ALK PHOS: 53 U/L (ref 26–84)
ALT: 37 U/L (ref 10–47)
AST: 24 U/L (ref 11–38)
Albumin: 3.3 g/dL — ABNORMAL LOW (ref 3.5–5.0)
Anion gap: 7 (ref 5–15)
BUN: 13 mg/dL (ref 7–22)
CALCIUM: 8.5 mg/dL (ref 8.0–10.3)
CO2: 28 mmol/L (ref 18–33)
CREATININE: 1 mg/dL (ref 0.60–1.20)
Chloride: 104 mmol/L (ref 98–108)
GLUCOSE: 106 mg/dL (ref 73–118)
Potassium: 3.8 mmol/L (ref 3.3–4.7)
SODIUM: 139 mmol/L (ref 128–145)
TOTAL PROTEIN: 6.7 g/dL (ref 6.4–8.1)
Total Bilirubin: 0.8 mg/dL (ref 0.2–1.6)

## 2017-05-13 LAB — IRON AND TIBC
IRON: 106 ug/dL (ref 42–163)
Saturation Ratios: 27 % — ABNORMAL LOW (ref 42–163)
TIBC: 390 ug/dL (ref 202–409)
UIBC: 285 ug/dL

## 2017-05-13 LAB — FERRITIN: Ferritin: 11 ng/mL — ABNORMAL LOW (ref 22–316)

## 2017-05-13 NOTE — Progress Notes (Signed)
Hematology and Oncology Follow Up Visit  Stuart Hill [id number] April 20, 1970 47 y.o. 05/13/2017   Principle Diagnosis:  Secondary polycythemia JAK2 (-), smoker LEFT Bell's palsy  Current Therapy:   Phlebotomy to maintain hematocrit below 42% Plavix 75 mg po q day- stoppedhimself and does not want to restart   Interim History:  Stuart Hill is here today with his wife for follow-up. He is still having fatigue and still some swelling in his throat post surgery. He had his tonsils removed on 04/30/17 and has recuperated nicely. He follows up with his surgeon next week.  Pathology showed lymphoid hyperplasia and acute inflammation.  No lymphadenopathy noted on exam.  His Hct is 39.4 so no phlebotomy needed this visit. He was iron deficient at his last visit due to chronic blood loss with phlebotomies. This could certainly contribute to his fatigue.  He is still smoking but is trying to cut back.  No fever, chills, n/v, cough, rash, dizziness, SOB, chest pain, palpitations, abdominal pain or changes in bowel or bladder habits.  No swelling, tenderness, numbness or tingling in his extremities. No c/o pain.  He has a good appetite but is careful to eat smaller bite with the swelling. He is drinking water slowly as well for this same reason. He has had no episodes of choking. His weight is stable.   ECOG Performance Status: 1 - Symptomatic but completely ambulatory  Medications:  Allergies as of 05/13/2017      Reactions   Asa [aspirin] Hives   Advil [ibuprofen] Hives   Afrin [nasal Spray] Hives   Antihistamines, Chlorpheniramine-type Hives   Atrovent [ipratropium] Hives   Benadryl [diphenhydramine Hcl] Hives   Buprenorphine Hcl Hives   Codeine Hives   Gastromax [metoclopramide] Hives   Morphine And Related Hives   Oxymetazoline Hives   Plavix [clopidogrel Bisulfate] Other (See Comments)   Confusion, lethargy, slurred speech   [name] [carisoprodol] Hives   Tylenol [acetaminophen]  Hives   Vicodin [hydrocodone-acetaminophen] Hives, Swelling, Other (See Comments)   ANGIOEDEMA   Pheniramine Hives, Rash      Medication List        Accurate as of 05/13/17 12:34 PM. Always use your most recent med list.          ALPRAZolam 1 MG tablet Commonly known as:  XANAX TAKE 1 TABLET BY MOUTH THREE TIMES A DAY AS NEEDED FOR ANXIETY   ergocalciferol 50000 units capsule Commonly known as:  VITAMIN D2 Take 1 capsule (50,000 Units total) by mouth once a week.   esomeprazole 20 MG capsule Commonly known as:  NEXIUM Take 20 mg by mouth daily at 12 noon.   HYDROcodone-acetaminophen 7.5-325 mg/15 ml solution Commonly known as:  HYCET Take 10-15 mLs by mouth every 6 (six) hours as needed for moderate pain (Use minimal dosing to help pain management.).   predniSONE 20 MG tablet Commonly known as:  DELTASONE Take 3 tablets (60 mg total) by mouth daily for 3 days, THEN 2 tablets (40 mg total) daily for 3 days, THEN 1 tablet (20 mg total) daily for 3 days, THEN 0.5 tablets (10 mg total) daily for 3 days. Take 3 tabs daily for 3 days then, Take 2 tabs daily for 3 days then, Take 1 tab daily for 3 days then, Take 1/2 tab daily for 3 days. Start taking on:  05/01/2017   sildenafil 20 MG tablet Commonly known as:  REVATIO Take 20- 60 mg po prior to sexual activity as needed  Allergies:  Allergies  Allergen Reactions  . Asa [Aspirin] Hives  . Advil [Ibuprofen] Hives  . Afrin [Nasal Spray] Hives  . Antihistamines, Chlorpheniramine-Type Hives  . Atrovent [Ipratropium] Hives  . Benadryl [Diphenhydramine Hcl] Hives  . Buprenorphine Hcl Hives  . Codeine Hives  . Gastromax [Metoclopramide] Hives  . Morphine And Related Hives  . Oxymetazoline Hives  . Plavix [Clopidogrel Bisulfate] Other (See Comments)    Confusion, lethargy, slurred speech  . [name] [Carisoprodol] Hives  . Tylenol [Acetaminophen] Hives  . Vicodin [Hydrocodone-Acetaminophen] Hives, Swelling and Other  (See Comments)    ANGIOEDEMA  . Pheniramine Hives and Rash    Past Medical History, Surgical history, Social history, and Family History were reviewed and updated.  Review of Systems: All other 10 point review of systems is negative.   Physical Exam:  weight is 195 lb (88.5 kg). His oral temperature is 97.5 F (36.4 C) (abnormal). His blood pressure is 126/75 and his pulse is 59 (abnormal). His respiration is 17 and oxygen saturation is 100%.   Wt Readings from Last 3 Encounters:  05/13/17 195 lb (88.5 kg)  04/30/17 197 lb (89.4 kg)  04/22/17 199 lb 12.8 oz (90.6 kg)    Ocular: Sclerae unicteric, pupils equal, round and reactive to light Ear-nose-throat: Oropharynx clear, dentition fair Lymphatic: No cervical, supraclavicular or axillary adenopathy Lungs no rales or rhonchi, good excursion bilaterally Heart regular rate and rhythm, no murmur appreciated Abd soft, nontender, positive bowel sounds, no liver or spleen tip palpated on exam, no fluid wave  MSK no focal spinal tenderness, no joint edema Neuro: non-focal, well-oriented, appropriate affect Breasts: Deferred   Lab Results  Component Value Date   WBC 10.1 (H) 05/13/2017   HGB 13.3 04/30/2017   HCT 39.4 05/13/2017   MCV 85.7 05/13/2017   PLT 294 05/13/2017   Lab Results  Component Value Date   FERRITIN 17 (L) 04/12/2017   IRON 38 (L) 04/12/2017   TIBC 420 (H) 04/12/2017   UIBC 383 04/12/2017   IRONPCTSAT 9 (L) 04/12/2017   Lab Results  Component Value Date   RBC 4.60 05/13/2017   No results found for: KPAFRELGTCHN, LAMBDASER, KAPLAMBRATIO No results found for: IGGSERUM, IGA, IGMSERUM No results found for: Odetta Pink, SPEI   Chemistry      Component Value Date/Time   NA 139 05/13/2017 1136   NA 143 01/01/2017 1453   NA 139 11/12/2015 0945   K 3.8 05/13/2017 1136   K 4.3 01/01/2017 1453   K 3.7 11/12/2015 0945   CL 104 05/13/2017 1136   CL 103  01/01/2017 1453   CO2 28 05/13/2017 1136   CO2 27 01/01/2017 1453   CO2 22 11/12/2015 0945   BUN 13 05/13/2017 1136   BUN 18 01/01/2017 1453   BUN 13.1 11/12/2015 0945   CREATININE 1.00 05/13/2017 1136   CREATININE 1.0 01/01/2017 1453   CREATININE 0.9 11/12/2015 0945      Component Value Date/Time   CALCIUM 8.5 05/13/2017 1136   CALCIUM 8.8 01/01/2017 1453   CALCIUM 8.9 11/12/2015 0945   ALKPHOS 53 05/13/2017 1136   ALKPHOS 44 01/01/2017 1453   ALKPHOS 66 11/12/2015 0945   AST 24 05/13/2017 1136   AST 13 11/12/2015 0945   ALT 37 05/13/2017 1136   ALT 368 (HH) 01/01/2017 1453   ALT 14 11/12/2015 0945   BILITOT 0.8 05/13/2017 1136   BILITOT <0.22 11/12/2015 0945      Impression  and Plan: Stuart Hill is a very pleasant 47 yo caucasian gentleman with 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 His Hct today is 39.4 so no phlebotomy needed at this time.  He had his tonsils removed 2 weeks ago and is recuperating well. He follows up with his surgeon next week.  We will plan to see him back in another month for follow-up.  He will contact our office with any questions or concerns. We can certainly see him sooner if need be.   [name], NP 4/18/201912:34 PM

## 2017-05-14 LAB — TESTOSTERONE: TESTOSTERONE: 266 ng/dL (ref 264–916)

## 2017-05-14 LAB — VITAMIN D 25 HYDROXY (VIT D DEFICIENCY, FRACTURES): VIT D 25 HYDROXY: 33.3 ng/mL (ref 30.0–100.0)

## 2017-05-20 ENCOUNTER — Other Ambulatory Visit: Payer: Self-pay | Admitting: Family Medicine

## 2017-05-21 NOTE — Telephone Encounter (Signed)
Called pt- he does not need prednisone. He did not request.  He is doing ok!

## 2017-06-01 ENCOUNTER — Other Ambulatory Visit: Payer: Self-pay | Admitting: Family Medicine

## 2017-06-01 DIAGNOSIS — F411 Generalized anxiety disorder: Secondary | ICD-10-CM

## 2017-06-01 NOTE — Telephone Encounter (Signed)
Pt seen very recently [facility]: ok to refill

## 2017-06-01 NOTE — Telephone Encounter (Signed)
Received refill request for ALPRAZolam (XANAX) 1 MG tablet. Last office visit 04/22/17 and last refill 02/18/17.

## 2017-06-07 ENCOUNTER — Inpatient Hospital Stay: Payer: Self-pay | Attending: Family | Admitting: Family

## 2017-06-07 ENCOUNTER — Inpatient Hospital Stay: Payer: Self-pay

## 2017-06-07 ENCOUNTER — Other Ambulatory Visit: Payer: Self-pay

## 2017-06-07 ENCOUNTER — Encounter: Payer: Self-pay | Admitting: Family

## 2017-06-07 VITALS — BP 129/83 | HR 59 | Temp 98.4°F | Resp 16 | Wt 198.0 lb

## 2017-06-07 DIAGNOSIS — D751 Secondary polycythemia: Secondary | ICD-10-CM

## 2017-06-07 DIAGNOSIS — F17210 Nicotine dependence, cigarettes, uncomplicated: Secondary | ICD-10-CM

## 2017-06-07 DIAGNOSIS — Z79899 Other long term (current) drug therapy: Secondary | ICD-10-CM

## 2017-06-07 DIAGNOSIS — D500 Iron deficiency anemia secondary to blood loss (chronic): Secondary | ICD-10-CM

## 2017-06-07 LAB — CMP (CANCER CENTER ONLY)
ALK PHOS: 50 U/L (ref 26–84)
ALT: 28 U/L (ref 10–47)
AST: 26 U/L (ref 11–38)
Albumin: 3.9 g/dL (ref 3.5–5.0)
Anion gap: 9 (ref 5–15)
BUN: 10 mg/dL (ref 7–22)
CO2: 29 mmol/L (ref 18–33)
CREATININE: 1 mg/dL (ref 0.60–1.20)
Calcium: 9.1 mg/dL (ref 8.0–10.3)
Chloride: 104 mmol/L (ref 98–108)
Glucose, Bld: 98 mg/dL (ref 73–118)
Potassium: 3.9 mmol/L (ref 3.3–4.7)
SODIUM: 142 mmol/L (ref 128–145)
TOTAL PROTEIN: 7.3 g/dL (ref 6.4–8.1)
Total Bilirubin: 0.6 mg/dL (ref 0.2–1.6)

## 2017-06-07 LAB — CBC WITH DIFFERENTIAL (CANCER CENTER ONLY)
BASOS ABS: 0 10*3/uL (ref 0.0–0.1)
Basophils Relative: 0 %
EOS ABS: 0.1 10*3/uL (ref 0.0–0.5)
EOS PCT: 1 %
HCT: 41.8 % (ref 38.7–49.9)
HEMOGLOBIN: 14 g/dL (ref 13.0–17.1)
LYMPHS PCT: 24 %
Lymphs Abs: 2.7 10*3/uL (ref 0.9–3.3)
MCH: 28.9 pg (ref 28.0–33.4)
MCHC: 33.5 g/dL (ref 32.0–35.9)
MCV: 86.2 fL (ref 82.0–98.0)
Monocytes Absolute: 0.8 10*3/uL (ref 0.1–0.9)
Monocytes Relative: 8 %
NEUTROS ABS: 7.4 10*3/uL — AB (ref 1.5–6.5)
Neutrophils Relative %: 67 %
PLATELETS: 285 10*3/uL (ref 145–400)
RBC: 4.85 MIL/uL (ref 4.20–5.70)
RDW: 14.5 % (ref 11.1–15.7)
WBC: 11 10*3/uL — AB (ref 4.0–10.0)

## 2017-06-07 NOTE — Progress Notes (Signed)
Hematology and Oncology Follow Up Visit  Stuart Hill [id number] [birth year] 47 y.o. 06/07/2017   Principle Diagnosis:  Secondary polycythemia JAK2 (-), smoker  LEFT Bell's palsy  Current Therapy:   Phlebotomy to maintain hematocrit below 42% Plavix 75 mg po q day- stoppedhimself and does not want to restart   Interim History: Stuart Hill is here today with his wife for follow-up. He is doing fairly well but still has some issues clearing his throat and some soreness. He states that his surgeon is aware of these symptoms and he will follow-up with them soon. He is still smoking. He still has some fatigue.  No lymphadenopathy noted on exam. Oral exam showed no edema or lesions.  No fever, chills, n/v, cough, rash, dizziness, SOB, chest pain, palpitations, abdominal pain or changes in bowel or bladder habits.  No episodes of bleeding, no bruising or petechiae.  No swelling, numbness or tingling in his extremities. He has generalized aches and pains in his back and joints again now that he is off of prednisone.  He has a good appetite and is staying well hydrated. His weight is stable.   ECOG Performance Status: 1 - Symptomatic but completely ambulatory  Medications:  Allergies as of 06/07/2017      Reactions   Asa [aspirin] Hives   Advil [ibuprofen] Hives   Afrin [nasal Spray] Hives   Antihistamines, Chlorpheniramine-type Hives   Atrovent [ipratropium] Hives   Benadryl [diphenhydramine Hcl] Hives   Buprenorphine Hcl Hives   Codeine Hives   Gastromax [metoclopramide] Hives   Morphine And Related Hives   Oxymetazoline Hives   Plavix [clopidogrel Bisulfate] Other (See Comments)   Confusion, lethargy, slurred speech   [name] [carisoprodol] Hives   Tylenol [acetaminophen] Hives   Vicodin [hydrocodone-acetaminophen] Hives, Swelling, Other (See Comments)   ANGIOEDEMA   Pheniramine Hives, Rash      Medication List        Accurate as of 06/07/17  3:36 PM. Always use your  most recent med list.          ALPRAZolam 1 MG tablet Commonly known as:  XANAX TAKE 1 TABLET BY MOUTH 3 TIMES A DAY AS NEEDED FOR ANXIETY   ergocalciferol 50000 units capsule Commonly known as:  VITAMIN D2 Take 1 capsule (50,000 Units total) by mouth once a week.   esomeprazole 20 MG capsule Commonly known as:  NEXIUM Take 20 mg by mouth daily at 12 noon.   HYDROcodone-acetaminophen 7.5-325 mg/15 ml solution Commonly known as:  HYCET Take 10-15 mLs by mouth every 6 (six) hours as needed for moderate pain (Use minimal dosing to help pain management.).   sildenafil 20 MG tablet Commonly known as:  REVATIO Take 20- 60 mg po prior to sexual activity as needed       Allergies:  Allergies  Allergen Reactions  . Asa [Aspirin] Hives  . Advil [Ibuprofen] Hives  . Afrin [Nasal Spray] Hives  . Antihistamines, Chlorpheniramine-Type Hives  . Atrovent [Ipratropium] Hives  . Benadryl [Diphenhydramine Hcl] Hives  . Buprenorphine Hcl Hives  . Codeine Hives  . Gastromax [Metoclopramide] Hives  . Morphine And Related Hives  . Oxymetazoline Hives  . Plavix [Clopidogrel Bisulfate] Other (See Comments)    Confusion, lethargy, slurred speech  . [name] [Carisoprodol] Hives  . Tylenol [Acetaminophen] Hives  . Vicodin [Hydrocodone-Acetaminophen] Hives, Swelling and Other (See Comments)    ANGIOEDEMA  . Pheniramine Hives and Rash    Past Medical History, Surgical history, Social history, and Family History  were reviewed and updated.  Review of Systems: All other 10 point review of systems is negative.   Physical Exam:  vitals were not taken for this visit.   Wt Readings from Last 3 Encounters:  05/13/17 195 lb (88.5 kg)  04/30/17 197 lb (89.4 kg)  04/22/17 199 lb 12.8 oz (90.6 kg)    Ocular: Sclerae unicteric, pupils equal, round and reactive to light Ear-nose-throat: Oropharynx clear, dentition fair Lymphatic: No cervical, supraclavicular or axillary adenopathy Lungs no rales  or rhonchi, good excursion bilaterally Heart regular rate and rhythm, no murmur appreciated Abd soft, nontender, positive bowel sounds, no liver or spleen tip palpated on exam MSK no focal spinal tenderness, no joint edema Neuro: non-focal, well-oriented, appropriate affect Breasts: Deferred   Lab Results  Component Value Date   WBC 11.0 (H) 06/07/2017   HGB 14.0 06/07/2017   HCT 41.8 06/07/2017   MCV 86.2 06/07/2017   PLT 285 06/07/2017   Lab Results  Component Value Date   FERRITIN 11 (L) 05/13/2017   IRON 106 05/13/2017   TIBC 390 05/13/2017   UIBC 285 05/13/2017   IRONPCTSAT 27 (L) 05/13/2017   Lab Results  Component Value Date   RBC 4.85 06/07/2017   No results found for: KPAFRELGTCHN, LAMBDASER, KAPLAMBRATIO No results found for: IGGSERUM, IGA, IGMSERUM No results found for: Odetta Pink, SPEI   Chemistry      Component Value Date/Time   NA 139 05/13/2017 1136   NA 143 01/01/2017 1453   NA 139 11/12/2015 0945   K 3.8 05/13/2017 1136   K 4.3 01/01/2017 1453   K 3.7 11/12/2015 0945   CL 104 05/13/2017 1136   CL 103 01/01/2017 1453   CO2 28 05/13/2017 1136   CO2 27 01/01/2017 1453   CO2 22 11/12/2015 0945   BUN 13 05/13/2017 1136   BUN 18 01/01/2017 1453   BUN 13.1 11/12/2015 0945   CREATININE 1.00 05/13/2017 1136   CREATININE 1.0 01/01/2017 1453   CREATININE 0.9 11/12/2015 0945      Component Value Date/Time   CALCIUM 8.5 05/13/2017 1136   CALCIUM 8.8 01/01/2017 1453   CALCIUM 8.9 11/12/2015 0945   ALKPHOS 53 05/13/2017 1136   ALKPHOS 44 01/01/2017 1453   ALKPHOS 66 11/12/2015 0945   AST 24 05/13/2017 1136   AST 13 11/12/2015 0945   ALT 37 05/13/2017 1136   ALT 368 (HH) 01/01/2017 1453   ALT 14 11/12/2015 0945   BILITOT 0.8 05/13/2017 1136   BILITOT <0.22 11/12/2015 0945      Impression and Plan: Stuart Hill is a pleasant 47 yo caucasian male with secondary polycythemia secondary to  smoking. Hct today is 41.8 so we will hold off on phlebotomizing him this visit.  We will see him back in another month for follow-up.  They will contact our office with any other questions or concerns. We can certainly see him sooner if need be.   [name], NP 5/13/20193:36 PM

## 2017-06-12 ENCOUNTER — Other Ambulatory Visit: Payer: Self-pay

## 2017-06-12 ENCOUNTER — Ambulatory Visit (INDEPENDENT_AMBULATORY_CARE_PROVIDER_SITE_OTHER): Payer: Self-pay | Admitting: Family Medicine

## 2017-06-12 ENCOUNTER — Encounter: Payer: Self-pay | Admitting: Family Medicine

## 2017-06-12 VITALS — BP 112/84 | HR 66 | Temp 98.1°F | Resp 16 | Ht 75.0 in | Wt 196.0 lb

## 2017-06-12 DIAGNOSIS — J383 Other diseases of vocal cords: Secondary | ICD-10-CM

## 2017-06-12 NOTE — Patient Instructions (Signed)
Set up follow up with Dr Wilburn Cornelia  May need nasolaryngoscopy to further evaluate

## 2017-06-12 NOTE — Progress Notes (Signed)
Subjective:     Patient ID: Stuart Hill, male   DOB: 05-Jan-1971, 47 y.o.   MRN: [id number]  HPI Patient seen with approximately one-month history of some altered sensation in his vocal cord region. He states he had tonsillectomy on 04/30/17. He had no difficulties with breathing since then but he states he has "blocked" sensation when trying to expel mucus since that time. He has also noticed recently some changes in voice. He is concerned about injury from airway/intubation.. He has not had any active GERD symptoms. No dysphagia. No wheezing.  Past Medical History:  Diagnosis Date  . Allergy   . Anxiety   . Arthritis   . Bell's palsy   . Erythrocytosis 04/10/2015  . Hyperlipidemia   . Polycythemia vera ([place]) 05/03/2015   Past Surgical History:  Procedure Laterality Date  . DENTAL SURGERY    . HAND SURGERY    . TONSILLECTOMY Bilateral 04/30/2017   Procedure: TONSILLECTOMY;  Surgeon: Jerrell Belfast, MD;  Location: [facility];  Service: ENT;  Laterality: Bilateral;    reports that he has been smoking cigarettes.  He has a 24.00 pack-year smoking history. He has never used smokeless tobacco. He reports that he drinks about 0.6 oz of alcohol per week. He reports that he does not use drugs. family history includes COPD in his mother; Depression in his brother; Fibromyalgia in his mother; Heart disease in his brother, father, maternal grandfather, maternal grandmother, paternal grandfather, and paternal grandmother; Stroke in his father, maternal grandfather, maternal grandmother, paternal aunt, paternal grandfather, and paternal grandmother. Allergies  Allergen Reactions  . Asa [Aspirin] Hives  . Acetaminophen Hives and Other (See Comments)  . Afrin [Nasal Spray] Hives  . Antihistamines, Chlorpheniramine-Type Hives  . Benadryl [Diphenhydramine Hcl] Hives  . Buprenorphine Hcl Hives and Other (See Comments)  . Carisoprodol Hives and Other (See Comments)  . Codeine Hives and  Other (See Comments)  . Diphenhydramine Hcl Other (See Comments) and Hives  . Hydrocodone-Acetaminophen Other (See Comments), Swelling and Hives    ANGIOEDEMA ANGIOEDEMA  . Ibuprofen Hives and Other (See Comments)  . Ipratropium Hives and Other (See Comments)  . Metoclopramide Hives and Other (See Comments)  . Morphine And Related Hives  . Other Hives  . Oxymetazoline Hives and Other (See Comments)  . Plavix [Clopidogrel Bisulfate] Other (See Comments)    Confusion, lethargy, slurred speech  . Vicodin [Hydrocodone-Acetaminophen] Hives, Swelling and Other (See Comments)    ANGIOEDEMA  . Pheniramine Hives, Rash and Other (See Comments)     Review of Systems  Constitutional: Negative for chills and fever.  HENT: Negative for sore throat and trouble swallowing.   Respiratory: Negative for cough and shortness of breath.   Cardiovascular: Negative for chest pain.  Gastrointestinal: Negative for abdominal pain.       Objective:   Physical Exam  HENT:  Mouth/Throat: Oropharynx is clear and moist.  Neck: Neck supple.  Cardiovascular: Normal rate and regular rhythm.  Pulmonary/Chest: Effort normal and breath sounds normal. No stridor. No respiratory distress. He has no wheezes. He has no rales.  Lymphadenopathy:    He has no cervical adenopathy.       Assessment:     Possible vocal cord injury vs inflammation following recent surgery/intubation as above.  No major airway problems.    Plan:     -recommend setting up follow-up with ENT. May need nasolaryngoscopy to further evaluate  Eulas Post MD  Primary Care at [facility]

## 2017-07-12 ENCOUNTER — Inpatient Hospital Stay: Payer: Self-pay | Attending: Family | Admitting: Family

## 2017-07-12 ENCOUNTER — Inpatient Hospital Stay: Payer: Self-pay

## 2017-07-12 ENCOUNTER — Encounter: Payer: Self-pay | Admitting: Family

## 2017-07-12 ENCOUNTER — Other Ambulatory Visit: Payer: Self-pay

## 2017-07-12 VITALS — BP 143/85 | HR 76 | Temp 97.9°F | Resp 18 | Wt 197.0 lb

## 2017-07-12 DIAGNOSIS — D500 Iron deficiency anemia secondary to blood loss (chronic): Secondary | ICD-10-CM

## 2017-07-12 DIAGNOSIS — D751 Secondary polycythemia: Secondary | ICD-10-CM

## 2017-07-12 DIAGNOSIS — F17210 Nicotine dependence, cigarettes, uncomplicated: Secondary | ICD-10-CM | POA: Insufficient documentation

## 2017-07-12 DIAGNOSIS — Z79899 Other long term (current) drug therapy: Secondary | ICD-10-CM | POA: Insufficient documentation

## 2017-07-12 DIAGNOSIS — I10 Essential (primary) hypertension: Secondary | ICD-10-CM | POA: Insufficient documentation

## 2017-07-12 DIAGNOSIS — H538 Other visual disturbances: Secondary | ICD-10-CM | POA: Insufficient documentation

## 2017-07-12 LAB — CBC WITH DIFFERENTIAL (CANCER CENTER ONLY)
BASOS PCT: 1 %
Basophils Absolute: 0.1 10*3/uL (ref 0.0–0.1)
EOS ABS: 0.1 10*3/uL (ref 0.0–0.5)
Eosinophils Relative: 1 %
HCT: 41.8 % (ref 38.7–49.9)
HEMOGLOBIN: 14 g/dL (ref 13.0–17.1)
Lymphocytes Relative: 24 %
Lymphs Abs: 2.9 10*3/uL (ref 0.9–3.3)
MCH: 28.7 pg (ref 28.0–33.4)
MCHC: 33.5 g/dL (ref 32.0–35.9)
MCV: 85.7 fL (ref 82.0–98.0)
MONOS PCT: 8 %
Monocytes Absolute: 0.9 10*3/uL (ref 0.1–0.9)
NEUTROS PCT: 66 %
Neutro Abs: 8 10*3/uL — ABNORMAL HIGH (ref 1.5–6.5)
Platelet Count: 238 10*3/uL (ref 145–400)
RBC: 4.88 MIL/uL (ref 4.20–5.70)
RDW: 15.9 % — ABNORMAL HIGH (ref 11.1–15.7)
WBC: 12 10*3/uL — AB (ref 4.0–10.0)

## 2017-07-12 LAB — CMP (CANCER CENTER ONLY)
ALBUMIN: 3.8 g/dL (ref 3.5–5.0)
ALK PHOS: 59 U/L (ref 26–84)
ALT: 22 U/L (ref 10–47)
AST: 20 U/L (ref 11–38)
Anion gap: 9 (ref 5–15)
BUN: 10 mg/dL (ref 7–22)
CHLORIDE: 103 mmol/L (ref 98–108)
CO2: 30 mmol/L (ref 18–33)
CREATININE: 1.3 mg/dL — AB (ref 0.60–1.20)
Calcium: 9.3 mg/dL (ref 8.0–10.3)
Glucose, Bld: 99 mg/dL (ref 73–118)
Potassium: 4.1 mmol/L (ref 3.3–4.7)
SODIUM: 142 mmol/L (ref 128–145)
Total Bilirubin: 0.7 mg/dL (ref 0.2–1.6)
Total Protein: 7.1 g/dL (ref 6.4–8.1)

## 2017-07-12 NOTE — Progress Notes (Signed)
Stuart Hill presents today for "mini" phlebotomy per MD orders. Phlebotomy procedure started at 1525 and ended at 1533. 278 grams removed from rt [facility] using 18g IV cath. Patient declined observation period. Patient tolerated procedure well. IV needle removed intact.

## 2017-07-12 NOTE — Patient Instructions (Signed)
Therapeutic Phlebotomy Therapeutic phlebotomy is the controlled removal of blood from a person's body for the purpose of treating a medical condition. The procedure is similar to donating blood. Usually, about a pint (470 mL, or 0.47L) of blood is removed. The average adult has 9-12 pints (4.3-5.7 L) of blood. Therapeutic phlebotomy may be used to treat the following medical conditions:  Hemochromatosis. This is a condition in which the blood contains too much iron.  Polycythemia vera. This is a condition in which the blood contains too many red blood cells.  Porphyria cutanea tarda. This is a disease in which an important part of hemoglobin is not made properly. It results in the buildup of abnormal amounts of porphyrins in the body.  Sickle cell disease. This is a condition in which the red blood cells form an abnormal crescent shape rather than a round shape.  Tell a health care provider about:  Any allergies you have.  All medicines you are taking, including vitamins, herbs, eye drops, creams, and over-the-counter medicines.  Any problems you or family members have had with anesthetic medicines.  Any blood disorders you have.  Any surgeries you have had.  Any medical conditions you have. What are the risks? Generally, this is a safe procedure. However, problems may occur, including:  Nausea or light-headedness.  Low blood pressure.  Soreness, bleeding, swelling, or bruising at the needle insertion site.  Infection.  What happens before the procedure?  Follow instructions from your health care provider about eating or drinking restrictions.  Ask your health care provider about changing or stopping your regular medicines. This is especially important if you are taking diabetes medicines or blood thinners.  Wear clothing with sleeves that can be raised above the elbow.  Plan to have someone take you home after the procedure.  You may have a blood sample taken. What  happens during the procedure?  A needle will be inserted into one of your veins.  Tubing and a collection bag will be attached to that needle.  Blood will flow through the needle and tubing into the collection bag.  You may be asked to open and close your hand slowly and continually during the entire collection.  After the specified amount of blood has been removed from your body, the collection bag and tubing will be clamped.  The needle will be removed from your vein.  Pressure will be held on the site of the needle insertion to stop the bleeding.  A bandage (dressing) will be placed over the needle insertion site. The procedure may vary among health care providers and hospitals. What happens after the procedure?  Your recovery will be assessed and monitored.  You can return to your normal activities as directed by your health care provider. This information is not intended to replace advice given to you by your health care provider. Make sure you discuss any questions you have with your health care provider. Document Released: 06/16/2010 Document Revised: 09/14/2015 Document Reviewed: 01/08/2014 Elsevier Interactive Patient Education  2018 Elsevier Inc.  

## 2017-07-12 NOTE — Progress Notes (Signed)
Hematology and Oncology Follow Up Visit  Stuart Hill [id number] 1970/11/04 47 y.o. 07/12/2017   Principle Diagnosis:  Secondary polycythemia JAK2 (-), smoker  LEFT Bell's palsy  Current Therapy:   Phlebotomy to maintain hematocrit below 42% Plavix 75 mg po q day- stoppedhimself and does not want to restart   Interim History: Stuart Hill is here today for follow-up. He states that his BP has been up and he has had episodes of bluy vision. Hct today is 41.8%.  He admits that he is smoking heavily again.  He is under a bit more stress at work than usual as well.  No fever, chills, n/v, cough, rash, dizziness, SOB, chest pain, palpitations, abdominal pain or changes in bowel or bladder habits.  No swelling, numbness or tingling in his extremities. He has generalized aches and pains in his back and joints due to arthritis.  He has maintained a good appetite and is staying well hydrated. No problems swallowing. His weight is stable.   ECOG Performance Status: 1 - Symptomatic but completely ambulatory  Medications:  Allergies as of 07/12/2017      Reactions   Asa [aspirin] Hives   Acetaminophen Hives, Other (See Comments)   Afrin [nasal Spray] Hives   Antihistamines, Chlorpheniramine-type Hives   Benadryl [diphenhydramine Hcl] Hives   Buprenorphine Hcl Hives, Other (See Comments)   Carisoprodol Hives, Other (See Comments)   Codeine Hives, Other (See Comments)   Diphenhydramine Hcl Other (See Comments), Hives   Hydrocodone-acetaminophen Other (See Comments), Swelling, Hives   ANGIOEDEMA ANGIOEDEMA   Ibuprofen Hives, Other (See Comments)   Ipratropium Hives, Other (See Comments)   Metoclopramide Hives, Other (See Comments)   Morphine And Related Hives   Other Hives   Oxymetazoline Hives, Other (See Comments)   Plavix [clopidogrel Bisulfate] Other (See Comments)   Confusion, lethargy, slurred speech   Vicodin [hydrocodone-acetaminophen] Hives, Swelling, Other (See  Comments)   ANGIOEDEMA   Pheniramine Hives, Rash, Other (See Comments)      Medication List        Accurate as of 07/12/17  3:01 PM. Always use your most recent med list.          ALPRAZolam 1 MG tablet Commonly known as:  XANAX TAKE 1 TABLET BY MOUTH 3 TIMES A DAY AS NEEDED FOR ANXIETY   ergocalciferol 50000 units capsule Commonly known as:  VITAMIN D2 Take 1 capsule (50,000 Units total) by mouth once a week.   esomeprazole 20 MG capsule Commonly known as:  NEXIUM Take 20 mg by mouth daily at 12 noon.   sildenafil 20 MG tablet Commonly known as:  REVATIO Take 20- 60 mg po prior to sexual activity as needed       Allergies:  Allergies  Allergen Reactions  . Asa [Aspirin] Hives  . Acetaminophen Hives and Other (See Comments)  . Afrin [Nasal Spray] Hives  . Antihistamines, Chlorpheniramine-Type Hives  . Benadryl [Diphenhydramine Hcl] Hives  . Buprenorphine Hcl Hives and Other (See Comments)  . Carisoprodol Hives and Other (See Comments)  . Codeine Hives and Other (See Comments)  . Diphenhydramine Hcl Other (See Comments) and Hives  . Hydrocodone-Acetaminophen Other (See Comments), Swelling and Hives    ANGIOEDEMA ANGIOEDEMA  . Ibuprofen Hives and Other (See Comments)  . Ipratropium Hives and Other (See Comments)  . Metoclopramide Hives and Other (See Comments)  . Morphine And Related Hives  . Other Hives  . Oxymetazoline Hives and Other (See Comments)  . Plavix [Clopidogrel Bisulfate] Other (  See Comments)    Confusion, lethargy, slurred speech  . Vicodin [Hydrocodone-Acetaminophen] Hives, Swelling and Other (See Comments)    ANGIOEDEMA  . Pheniramine Hives, Rash and Other (See Comments)    Past Medical History, Surgical history, Social history, and Family History were reviewed and updated.  Review of Systems: All other 10 point review of systems is negative.   Physical Exam:  vitals were not taken for this visit.   Wt Readings from Last 3 Encounters:    06/12/17 196 lb (88.9 kg)  06/07/17 198 lb (89.8 kg)  05/13/17 195 lb (88.5 kg)    Ocular: Sclerae unicteric, pupils equal, round and reactive to light Ear-nose-throat: Oropharynx clear, dentition fair Lymphatic: No cervical, supraclavicular or axillary adenopathy Lungs no rales or rhonchi, good excursion bilaterally Heart regular rate and rhythm, no murmur appreciated Abd soft, nontender, positive bowel sounds, no liver or spleen tip palpated on exam, no fluid wave  MSK no focal spinal tenderness, no joint edema Neuro: non-focal, well-oriented, appropriate affect Breasts: Deferred   Lab Results  Component Value Date   WBC 12.0 (H) 07/12/2017   HGB 14.0 07/12/2017   HCT 41.8 07/12/2017   MCV 85.7 07/12/2017   PLT 238 07/12/2017   Lab Results  Component Value Date   FERRITIN 11 (L) 05/13/2017   IRON 106 05/13/2017   TIBC 390 05/13/2017   UIBC 285 05/13/2017   IRONPCTSAT 27 (L) 05/13/2017   Lab Results  Component Value Date   RBC 4.88 07/12/2017   No results found for: KPAFRELGTCHN, LAMBDASER, KAPLAMBRATIO No results found for: IGGSERUM, IGA, IGMSERUM No results found for: Odetta Pink, SPEI   Chemistry      Component Value Date/Time   NA 142 06/07/2017 1508   NA 143 01/01/2017 1453   NA 139 11/12/2015 0945   K 3.9 06/07/2017 1508   K 4.3 01/01/2017 1453   K 3.7 11/12/2015 0945   CL 104 06/07/2017 1508   CL 103 01/01/2017 1453   CO2 29 06/07/2017 1508   CO2 27 01/01/2017 1453   CO2 22 11/12/2015 0945   BUN 10 06/07/2017 1508   BUN 18 01/01/2017 1453   BUN 13.1 11/12/2015 0945   CREATININE 1.00 06/07/2017 1508   CREATININE 1.0 01/01/2017 1453   CREATININE 0.9 11/12/2015 0945      Component Value Date/Time   CALCIUM 9.1 06/07/2017 1508   CALCIUM 8.8 01/01/2017 1453   CALCIUM 8.9 11/12/2015 0945   ALKPHOS 50 06/07/2017 1508   ALKPHOS 44 01/01/2017 1453   ALKPHOS 66 11/12/2015 0945   AST 26 06/07/2017  1508   AST 13 11/12/2015 0945   ALT 28 06/07/2017 1508   ALT 368 (HH) 01/01/2017 1453   ALT 14 11/12/2015 0945   BILITOT 0.6 06/07/2017 1508   BILITOT <0.22 11/12/2015 0945      Impression and Plan: Stuart Hill is a pleasant 47 yo caucasian gentleman with polycythemia secondary to smoking. Hct today is 41.8%. He is symptomatic with episodes of HTN and blurry vision so we will do a mini phlebotomy today. He is in agreement with the plan.  We will see him back in another month for follow-up.  He will contact our office with any questions or concerns. We can certainly see him sooner if need be.    [name], NP 6/17/20193:01 PM

## 2017-07-13 LAB — FERRITIN: FERRITIN: 9 ng/mL — AB (ref 22–316)

## 2017-07-13 LAB — IRON AND TIBC
Iron: 21 ug/dL — ABNORMAL LOW (ref 42–163)
SATURATION RATIOS: 5 % — AB (ref 42–163)
TIBC: 400 ug/dL (ref 202–409)
UIBC: 379 ug/dL

## 2017-07-21 IMAGING — MR MR HEAD W/O CM
9 of 11 series · 33 of 48 positions shown · non-contrast
Comparison: 09/01/2015 head CT

CLINICAL DATA: Back of head ache for 4 days. Tongue numbness
beginning yesterday.

EXAM:
MRI HEAD WITHOUT CONTRAST
TECHNIQUE: Multiplanar, multiecho pulse sequences of the brain and surrounding
structures were obtained without intravenous contrast.

[Series 3: T2 · axial · 5.0mm · 0.43mm/px · z∈[-105,+69]mm · 2 of 30 slices shown]
[im 1/30]
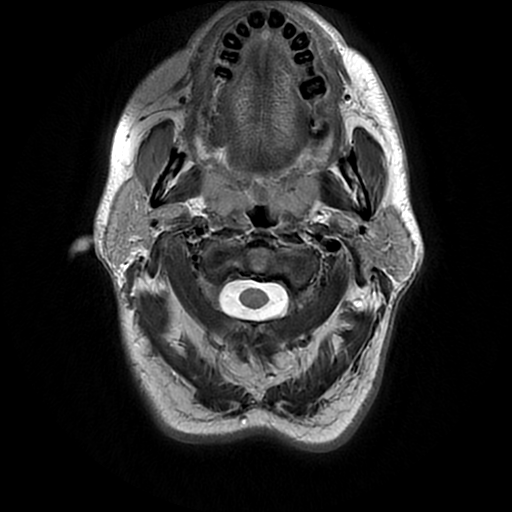
[im 30/30]
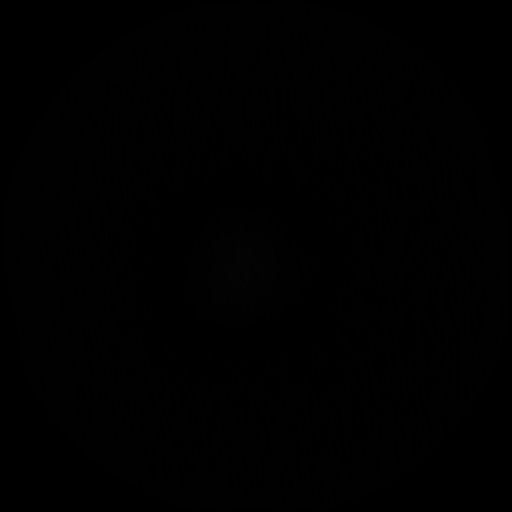

[Series 6: (person_name) · axial · 3.0mm · 0.47mm/px · z∈[-106,-4]mm · 5 of 120 slices shown]
[im 1/120]
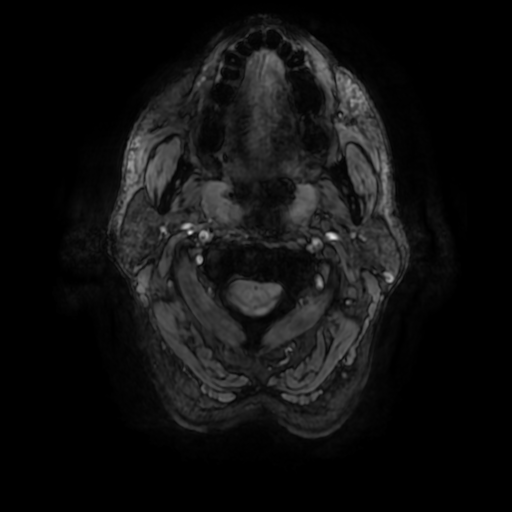
[im 18/120]
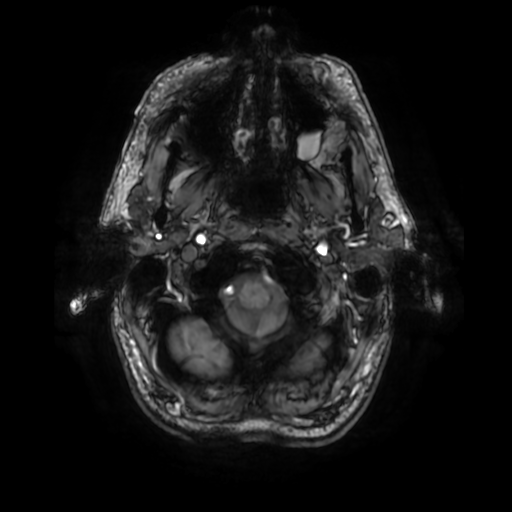
[im 35/120]
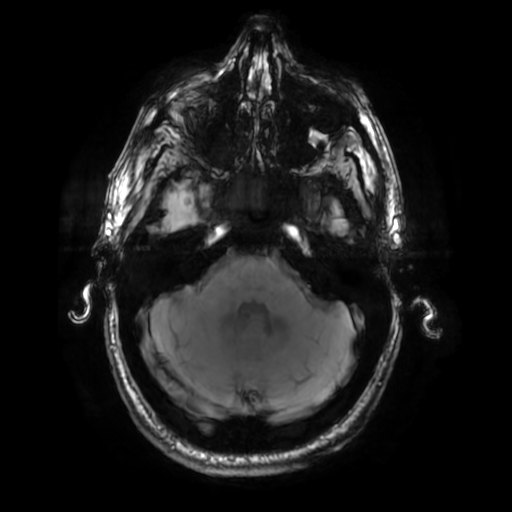
[im 52/120]
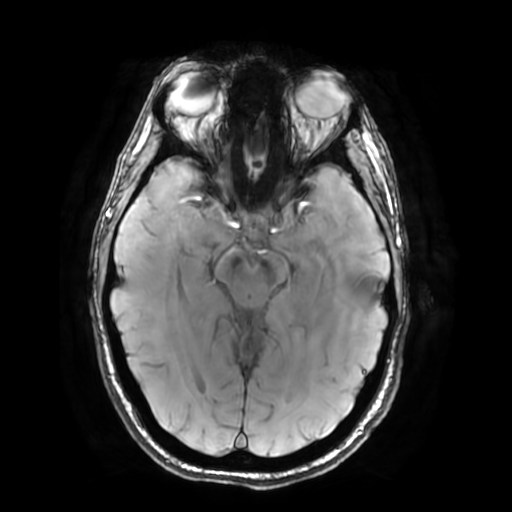
[im 69/120]
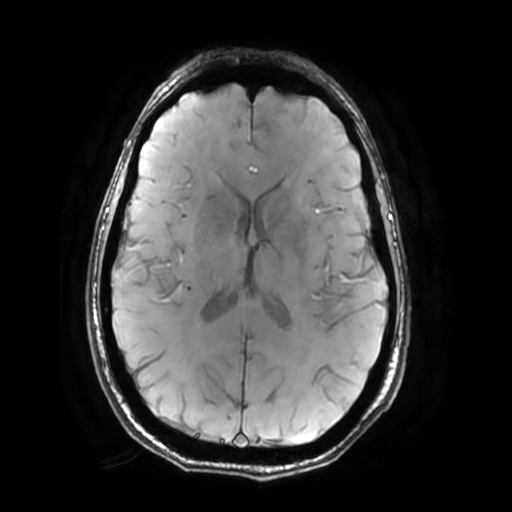

[Series 8: T2 post-contrast · coronal · 5.0mm · 0.39mm/px · 2 of 30 slices shown]
[im 1/30]
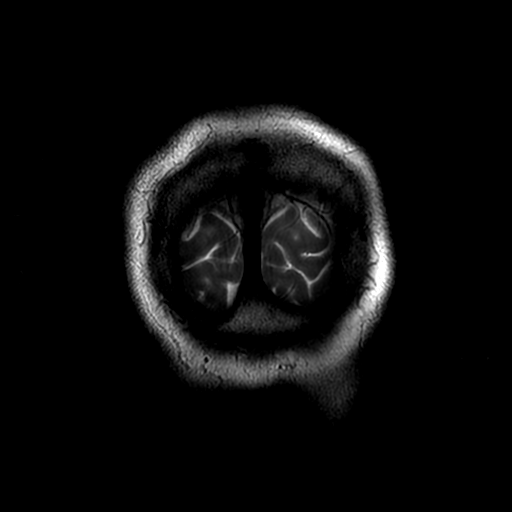
[im 30/30]
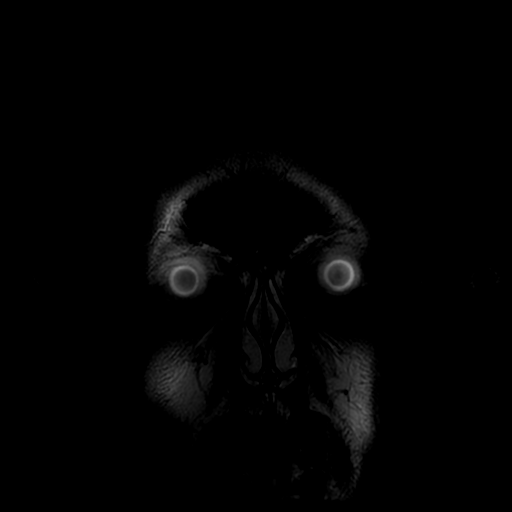

[Series 11: FLAIR · sagittal · 5.0mm · 0.47mm/px · 2 of 25 slices shown (1 of 2)]
[im 1/25]
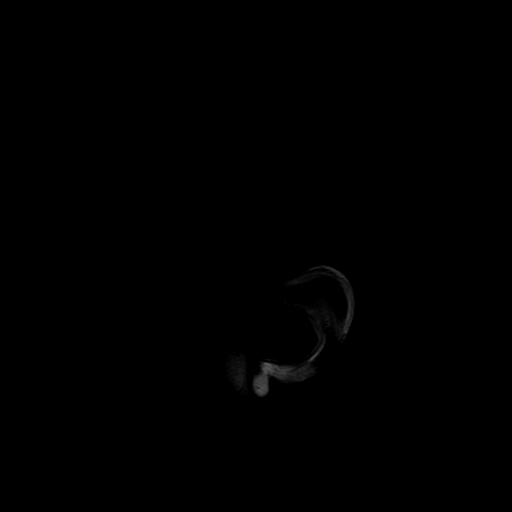
[im 25/25]
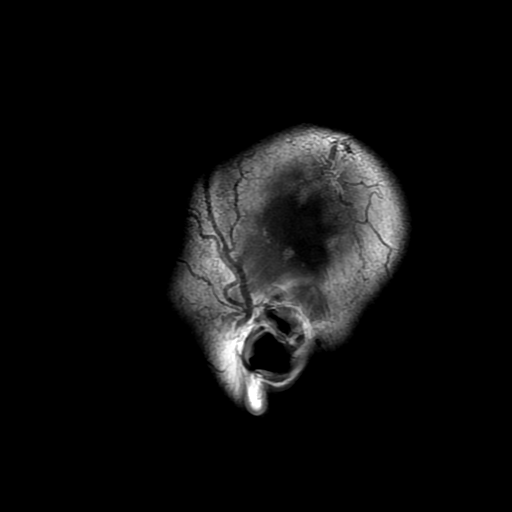

[Series 13: DWI · axial · 3.0mm · 0.94mm/px · z∈[-87,+72]mm · 8 of 108 slices shown (1 of 2)]
[im 1/108]
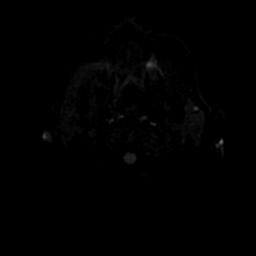
[im 16/108]
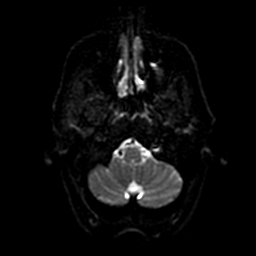
[im 31/108]
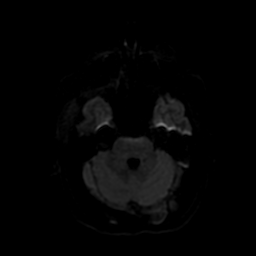
[im 46/108]
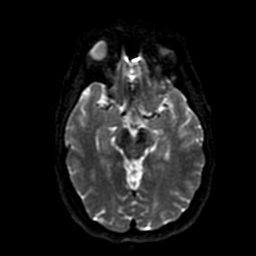
[im 62/108]
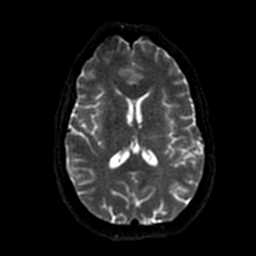
[im 77/108]
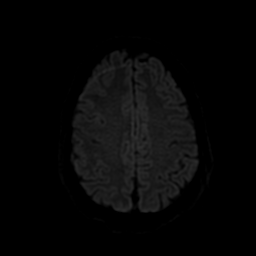
[im 92/108]
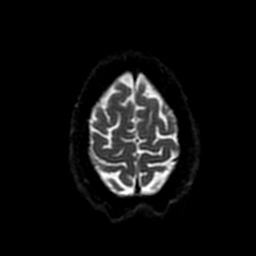
[im 108/108]
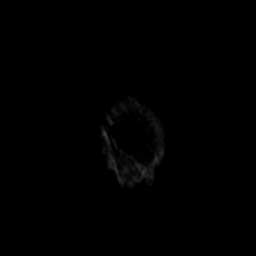

[Series 14: FLAIR · axial · 4.0mm · 0.41mm/px · z∈[-84,+70]mm · 2 of 29 slices shown (2 of 2)]
[im 1/29]
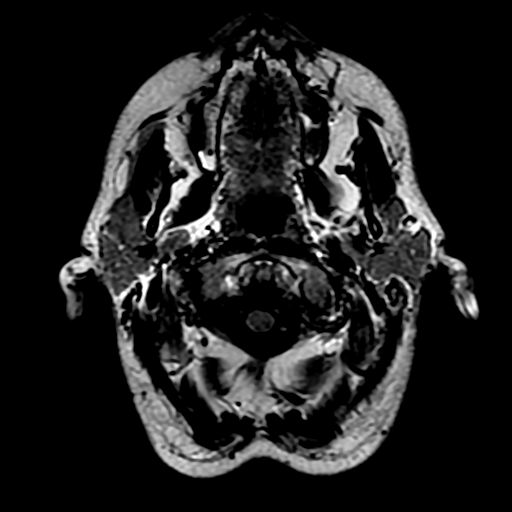
[im 29/29]
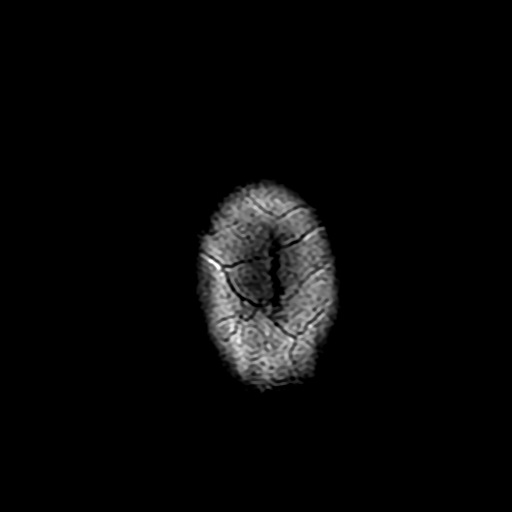

[Series 15: DWI · coronal · 5.0mm · 0.94mm/px · 5 of 78 slices shown (2 of 2)]
[im 1/78]
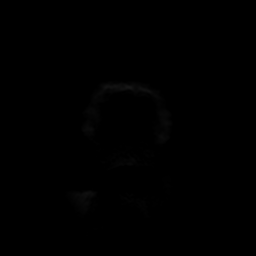
[im 20/78]
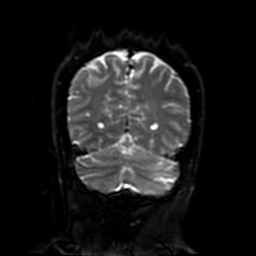
[im 39/78]
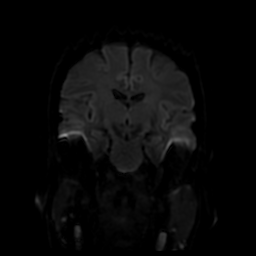
[im 58/78]
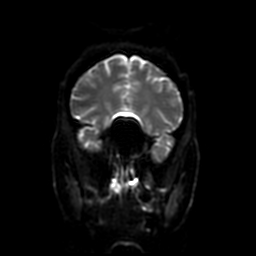
[im 78/78]
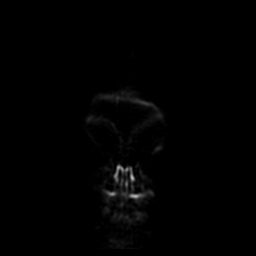

[Series 1350: ADC · axial · 3.0mm · 0.94mm/px · z∈[-87,+72]mm · 4 of 54 slices shown (1 of 2)]
[im 1/54]
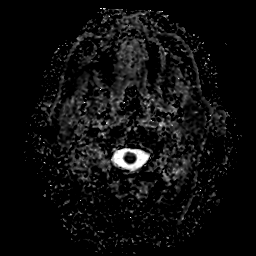
[im 18/54]
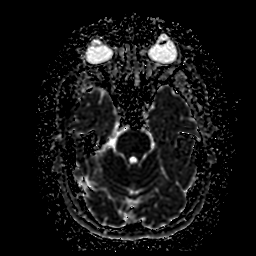
[im 36/54]
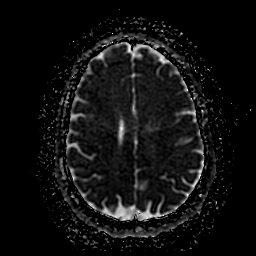
[im 54/54]
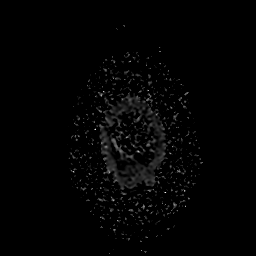

[Series 1550: ADC · coronal · 5.0mm · 0.94mm/px · 3 of 39 slices shown (2 of 2)]
[im 1/39]
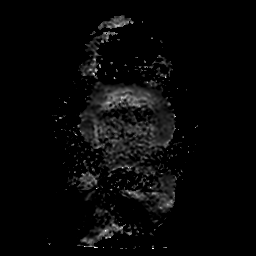
[im 20/39]
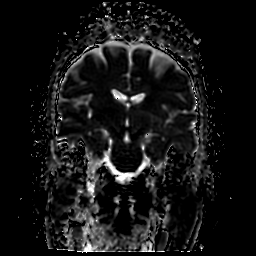
[im 39/39]
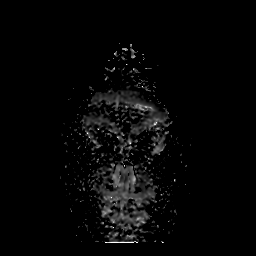

[33 of 48 positions shown; findings below may reference images not displayed]

FINDINGS: Calvarium and upper cervical spine: No focal marrow signal
abnormality.

Orbits: Negative.

Sinuses and Mastoids: Small nonobstructive mucous retention cysts in
the left maxillary antrum.

Brain: No acute or remote infarct, hemorrhage, hydrocephalus, or
mass lesion. No evidence of large vessel occlusion. No white matter
disease or atrophy.
IMPRESSION: Normal appearance of the brain.  No explanation for headache.

## 2017-08-09 ENCOUNTER — Inpatient Hospital Stay: Payer: Self-pay

## 2017-08-09 ENCOUNTER — Other Ambulatory Visit: Payer: Self-pay

## 2017-08-09 ENCOUNTER — Inpatient Hospital Stay: Payer: Self-pay | Attending: Family | Admitting: Family

## 2017-08-09 VITALS — BP 131/72 | HR 55

## 2017-08-09 VITALS — BP 132/70 | HR 52 | Temp 97.7°F | Resp 18 | Wt 193.0 lb

## 2017-08-09 DIAGNOSIS — D751 Secondary polycythemia: Secondary | ICD-10-CM | POA: Insufficient documentation

## 2017-08-09 DIAGNOSIS — F17210 Nicotine dependence, cigarettes, uncomplicated: Secondary | ICD-10-CM | POA: Insufficient documentation

## 2017-08-09 DIAGNOSIS — Z79899 Other long term (current) drug therapy: Secondary | ICD-10-CM | POA: Insufficient documentation

## 2017-08-09 DIAGNOSIS — D500 Iron deficiency anemia secondary to blood loss (chronic): Secondary | ICD-10-CM

## 2017-08-09 DIAGNOSIS — G510 Bell's palsy: Secondary | ICD-10-CM | POA: Insufficient documentation

## 2017-08-09 LAB — CMP (CANCER CENTER ONLY)
ALK PHOS: 59 U/L (ref 26–84)
ALT: 22 U/L (ref 10–47)
AST: 21 U/L (ref 11–38)
Albumin: 3.7 g/dL (ref 3.5–5.0)
Anion gap: 7 (ref 5–15)
BILIRUBIN TOTAL: 0.6 mg/dL (ref 0.2–1.6)
BUN: 11 mg/dL (ref 7–22)
CO2: 30 mmol/L (ref 18–33)
CREATININE: 1 mg/dL (ref 0.60–1.20)
Calcium: 9 mg/dL (ref 8.0–10.3)
Chloride: 103 mmol/L (ref 98–108)
Glucose, Bld: 103 mg/dL (ref 73–118)
POTASSIUM: 4.2 mmol/L (ref 3.3–4.7)
SODIUM: 140 mmol/L (ref 128–145)
TOTAL PROTEIN: 7.2 g/dL (ref 6.4–8.1)

## 2017-08-09 LAB — CBC WITH DIFFERENTIAL (CANCER CENTER ONLY)
BASOS ABS: 0 10*3/uL (ref 0.0–0.1)
Basophils Relative: 0 %
Eosinophils Absolute: 0 10*3/uL (ref 0.0–0.5)
Eosinophils Relative: 0 %
HEMATOCRIT: 43.6 % (ref 38.7–49.9)
HEMOGLOBIN: 14.7 g/dL (ref 13.0–17.1)
Lymphocytes Relative: 15 %
Lymphs Abs: 1.7 10*3/uL (ref 0.9–3.3)
MCH: 28.9 pg (ref 28.0–33.4)
MCHC: 33.7 g/dL (ref 32.0–35.9)
MCV: 85.7 fL (ref 82.0–98.0)
Monocytes Absolute: 0.8 10*3/uL (ref 0.1–0.9)
Monocytes Relative: 7 %
NEUTROS PCT: 78 %
Neutro Abs: 8.8 10*3/uL — ABNORMAL HIGH (ref 1.5–6.5)
Platelet Count: 271 10*3/uL (ref 145–400)
RBC: 5.09 MIL/uL (ref 4.20–5.70)
RDW: 15.6 % (ref 11.1–15.7)
WBC Count: 11.3 10*3/uL — ABNORMAL HIGH (ref 4.0–10.0)

## 2017-08-09 NOTE — Progress Notes (Signed)
Stuart Hill presents today for phlebotomy per MD orders. Phlebotomy procedure started at 1525 and ended at 1533. 518 cc removed via 16 G needle at R antecubital site. Patient tolerated procedure well. Refused to wait 30 minutes post phlebotomy, released stable and ASX.

## 2017-08-09 NOTE — Progress Notes (Signed)
Hematology and Oncology Follow Up Visit  Stuart Hill [id number] 1970-11-21 47 y.o. 08/09/2017   Principle Diagnosis:  Secondary polycythemia JAK2 (-), smoker  LEFT Bell's palsy  Current Therapy:   Phlebotomy to maintain hematocrit below 42% Plavix 75 mg po q day- stoppedhimself and does not want to restart   Interim History:  Stuart Hill is here today with his wife for follow-up. He is states that he feels "fuzzy", has had blurry vision and fatigue. Hct is 43.6.  No falls or syncopal episodes.  No fever, chills, n/v, cough, rash, dizziness, SOB, chest pain, palpitations, abdominal pain or changes in bowel or bladder habits.  He has chronic joint aches and pains. No swelling, numbness or tingling in his extremities.  No lymphadenopathy noted on exam.  No bleeding, bruising or petechiae.  He has maintained a good appetite but admits that he needs to better hydrate throughout the day. His weight is stable.   ECOG Performance Status: 1 - Symptomatic but completely ambulatory  Medications:  Allergies as of 08/09/2017      Reactions   Asa [aspirin] Hives   Acetaminophen Hives, Other (See Comments)   Afrin [nasal Spray] Hives   Antihistamines, Chlorpheniramine-type Hives   Benadryl [diphenhydramine Hcl] Hives   Buprenorphine Hcl Hives, Other (See Comments)   Carisoprodol Hives, Other (See Comments)   Codeine Hives, Other (See Comments)   Diphenhydramine Hcl Other (See Comments), Hives   Hydrocodone-acetaminophen Other (See Comments), Swelling, Hives   ANGIOEDEMA ANGIOEDEMA   Ibuprofen Hives, Other (See Comments)   Ipratropium Hives, Other (See Comments)   Metoclopramide Hives, Other (See Comments)   Morphine And Related Hives   Other Hives   Oxymetazoline Hives, Other (See Comments)   Plavix [clopidogrel Bisulfate] Other (See Comments)   Confusion, lethargy, slurred speech   Vicodin [hydrocodone-acetaminophen] Hives, Swelling, Other (See Comments)   ANGIOEDEMA   Pheniramine Hives, Rash, Other (See Comments)      Medication List        Accurate as of 08/09/17  2:59 PM. Always use your most recent med list.          ALPRAZolam 1 MG tablet Commonly known as:  XANAX TAKE 1 TABLET BY MOUTH 3 TIMES A DAY AS NEEDED FOR ANXIETY   ergocalciferol 50000 units capsule Commonly known as:  VITAMIN D2 Take 1 capsule (50,000 Units total) by mouth once a week.   esomeprazole 20 MG capsule Commonly known as:  NEXIUM Take 20 mg by mouth daily at 12 noon.   sildenafil 20 MG tablet Commonly known as:  REVATIO Take 20- 60 mg po prior to sexual activity as needed       Allergies:  Allergies  Allergen Reactions  . Asa [Aspirin] Hives  . Acetaminophen Hives and Other (See Comments)  . Afrin [Nasal Spray] Hives  . Antihistamines, Chlorpheniramine-Type Hives  . Benadryl [Diphenhydramine Hcl] Hives  . Buprenorphine Hcl Hives and Other (See Comments)  . Carisoprodol Hives and Other (See Comments)  . Codeine Hives and Other (See Comments)  . Diphenhydramine Hcl Other (See Comments) and Hives  . Hydrocodone-Acetaminophen Other (See Comments), Swelling and Hives    ANGIOEDEMA ANGIOEDEMA  . Ibuprofen Hives and Other (See Comments)  . Ipratropium Hives and Other (See Comments)  . Metoclopramide Hives and Other (See Comments)  . Morphine And Related Hives  . Other Hives  . Oxymetazoline Hives and Other (See Comments)  . Plavix [Clopidogrel Bisulfate] Other (See Comments)    Confusion, lethargy, slurred speech  .  Vicodin [Hydrocodone-Acetaminophen] Hives, Swelling and Other (See Comments)    ANGIOEDEMA  . Pheniramine Hives, Rash and Other (See Comments)    Past Medical History, Surgical history, Social history, and Family History were reviewed and updated.  Review of Systems: All other 10 point review of systems is negative.   Physical Exam:  vitals were not taken for this visit.   Wt Readings from Last 3 Encounters:  07/12/17 197 lb (89.4 kg)    06/12/17 196 lb (88.9 kg)  06/07/17 198 lb (89.8 kg)    Ocular: Sclerae unicteric, pupils equal, round and reactive to light Ear-nose-throat: Oropharynx clear, dentition fair Lymphatic: No cervical, supraclavicular or axillary adenopathy Lungs no rales or rhonchi, good excursion bilaterally Heart regular rate and rhythm, no murmur appreciated Abd soft, nontender, positive bowel sounds, no liver or spleen tip palpated on exam, no fluid wave  MSK no focal spinal tenderness, no joint edema Neuro: non-focal, well-oriented, appropriate affect Breasts: Deferred   Lab Results  Component Value Date   WBC 11.3 (H) 08/09/2017   HGB 14.7 08/09/2017   HCT 43.6 08/09/2017   MCV 85.7 08/09/2017   PLT 271 08/09/2017   Lab Results  Component Value Date   FERRITIN 9 (L) 07/12/2017   IRON 21 (L) 07/12/2017   TIBC 400 07/12/2017   UIBC 379 07/12/2017   IRONPCTSAT 5 (L) 07/12/2017   Lab Results  Component Value Date   RBC 5.09 08/09/2017   No results found for: KPAFRELGTCHN, LAMBDASER, KAPLAMBRATIO No results found for: IGGSERUM, IGA, IGMSERUM No results found for: Stuart Hill, SPEI   Chemistry      Component Value Date/Time   NA 142 07/12/2017 1447   NA 143 01/01/2017 1453   NA 139 11/12/2015 0945   K 4.1 07/12/2017 1447   K 4.3 01/01/2017 1453   K 3.7 11/12/2015 0945   CL 103 07/12/2017 1447   CL 103 01/01/2017 1453   CO2 30 07/12/2017 1447   CO2 27 01/01/2017 1453   CO2 22 11/12/2015 0945   BUN 10 07/12/2017 1447   BUN 18 01/01/2017 1453   BUN 13.1 11/12/2015 0945   CREATININE 1.30 (H) 07/12/2017 1447   CREATININE 1.0 01/01/2017 1453   CREATININE 0.9 11/12/2015 0945      Component Value Date/Time   CALCIUM 9.3 07/12/2017 1447   CALCIUM 8.8 01/01/2017 1453   CALCIUM 8.9 11/12/2015 0945   ALKPHOS 59 07/12/2017 1447   ALKPHOS 44 01/01/2017 1453   ALKPHOS 66 11/12/2015 0945   AST 20 07/12/2017 1447   AST 13  11/12/2015 0945   ALT 22 07/12/2017 1447   ALT 368 (HH) 01/01/2017 1453   ALT 14 11/12/2015 0945   BILITOT 0.7 07/12/2017 1447   BILITOT <0.22 11/12/2015 0945      Impression and Plan: Stuart Hill is a pleasant 47 yo caucasian gentleman with polycythemia secondary to smoking. He is symptomatic as mentioned above and Hct is 43.6%.  We will proceed with phlebotomy today as planned.  We will see him back in another month for follow-up.  They will contact our office with any questions or concerns. We can certainly see him sooner if need be.   [name], NP 7/15/20192:59 PM

## 2017-08-09 NOTE — Patient Instructions (Signed)

## 2017-08-10 LAB — FERRITIN: Ferritin: 20 ng/mL — ABNORMAL LOW (ref 24–336)

## 2017-08-10 LAB — IRON AND TIBC
Iron: 31 ug/dL — ABNORMAL LOW (ref 42–163)
Saturation Ratios: 7 % — ABNORMAL LOW (ref 42–163)
TIBC: 429 ug/dL — ABNORMAL HIGH (ref 202–409)
UIBC: 397 ug/dL

## 2017-09-02 ENCOUNTER — Other Ambulatory Visit: Payer: Self-pay | Admitting: Family Medicine

## 2017-09-02 DIAGNOSIS — F411 Generalized anxiety disorder: Secondary | ICD-10-CM

## 2017-09-02 NOTE — Telephone Encounter (Signed)
Requesting:Alprazolam Contract:none, needs csc HNG:[id number], needs uds Last Visit:04/22/17 Next Visit:09/13/17 Last Refill:06/01/17 2 refills  Please Advise

## 2017-09-03 NOTE — Telephone Encounter (Signed)
Reviewed NCCSr- ok to refill alprazolam today

## 2017-09-13 ENCOUNTER — Inpatient Hospital Stay: Payer: Self-pay

## 2017-09-13 ENCOUNTER — Inpatient Hospital Stay: Payer: Self-pay | Attending: Family | Admitting: Family

## 2017-09-13 ENCOUNTER — Other Ambulatory Visit: Payer: Self-pay

## 2017-09-13 VITALS — BP 124/72 | HR 51

## 2017-09-13 VITALS — BP 114/68 | HR 56 | Temp 98.1°F | Resp 18 | Wt 188.2 lb

## 2017-09-13 DIAGNOSIS — D751 Secondary polycythemia: Secondary | ICD-10-CM

## 2017-09-13 DIAGNOSIS — F17210 Nicotine dependence, cigarettes, uncomplicated: Secondary | ICD-10-CM

## 2017-09-13 DIAGNOSIS — D500 Iron deficiency anemia secondary to blood loss (chronic): Secondary | ICD-10-CM

## 2017-09-13 LAB — CBC WITH DIFFERENTIAL (CANCER CENTER ONLY)
BASOS ABS: 0 10*3/uL (ref 0.0–0.1)
Basophils Relative: 0 %
EOS PCT: 1 %
Eosinophils Absolute: 0.1 10*3/uL (ref 0.0–0.5)
HEMATOCRIT: 43.7 % (ref 38.7–49.9)
HEMOGLOBIN: 14.7 g/dL (ref 13.0–17.1)
LYMPHS ABS: 2.3 10*3/uL (ref 0.9–3.3)
LYMPHS PCT: 18 %
MCH: 29.3 pg (ref 28.0–33.4)
MCHC: 33.6 g/dL (ref 32.0–35.9)
MCV: 87.1 fL (ref 82.0–98.0)
Monocytes Absolute: 0.9 10*3/uL (ref 0.1–0.9)
Monocytes Relative: 7 %
NEUTROS ABS: 9.4 10*3/uL — AB (ref 1.5–6.5)
Neutrophils Relative %: 74 %
Platelet Count: 266 10*3/uL (ref 145–400)
RBC: 5.02 MIL/uL (ref 4.20–5.70)
RDW: 14.3 % (ref 11.1–15.7)
WBC: 12.8 10*3/uL — AB (ref 4.0–10.0)

## 2017-09-13 LAB — CMP (CANCER CENTER ONLY)
ALK PHOS: 50 U/L (ref 26–84)
ALT: 24 U/L (ref 10–47)
ANION GAP: 7 (ref 5–15)
AST: 28 U/L (ref 11–38)
Albumin: 3.9 g/dL (ref 3.5–5.0)
BILIRUBIN TOTAL: 0.6 mg/dL (ref 0.2–1.6)
BUN: 14 mg/dL (ref 7–22)
CALCIUM: 9.1 mg/dL (ref 8.0–10.3)
CO2: 28 mmol/L (ref 18–33)
Chloride: 105 mmol/L (ref 98–108)
Creatinine: 1.1 mg/dL (ref 0.60–1.20)
GLUCOSE: 94 mg/dL (ref 73–118)
POTASSIUM: 4.2 mmol/L (ref 3.3–4.7)
Sodium: 140 mmol/L (ref 128–145)
TOTAL PROTEIN: 7.3 g/dL (ref 6.4–8.1)

## 2017-09-13 LAB — RETICULOCYTES
RBC.: 4.99 MIL/uL (ref 4.20–5.82)
RETIC COUNT ABSOLUTE: 39.9 10*3/uL (ref 34.8–93.9)
RETIC CT PCT: 0.8 % (ref 0.8–1.8)

## 2017-09-13 NOTE — Progress Notes (Signed)
Stuart Hill presents today for phlebotomy per MD orders. Phlebotomy procedure started at 1540 and ended at 1550. 512 grams removed. Patient observed for 30 minutes after procedure without any incident. Patient tolerated procedure well. IV needle removed intact.

## 2017-09-13 NOTE — Patient Instructions (Signed)

## 2017-09-13 NOTE — Progress Notes (Signed)
Hematology and Oncology Follow Up Visit  Stuart Hill [id number] 11/03/1970 47 y.o. 09/13/2017   Principle Diagnosis:  Secondary polycythemia JAK2 (-), smoker  LEFT Bell's palsy  Current Therapy:   Phlebotomy to maintain hematocrit below 42% Plavix 75 mg po q day- stoppedhimself and does not want to restart   Interim History:  Stuart Hill is here today with his wife for follow-up. He is doing well but still has generalized joint aches and pains due to arthritis.  No c/o fatigue.  He has had no issue with infections. No fever, chills, n/v, cough, rash, dizziness, SOB, chest pain, palpitations, abdominal pain or changes in bowel or bladder habits.  No swelling, numbness or tingling in his extremities. No lymphadenopathy noted on exam.  He has maintained a good appetite and is staying well hydrated. Her weight is stable.   ECOG Performance Status: 1 - Symptomatic but completely ambulatory  Medications:  Allergies as of 09/13/2017      Reactions   Asa [aspirin] Hives   Acetaminophen Hives, Other (See Comments)   Afrin [nasal Spray] Hives   Antihistamines, Chlorpheniramine-type Hives   Benadryl [diphenhydramine Hcl] Hives   Buprenorphine Hcl Hives, Other (See Comments)   Carisoprodol Hives, Other (See Comments)   Codeine Hives, Other (See Comments)   Diphenhydramine Hcl Other (See Comments), Hives   Hydrocodone-acetaminophen Other (See Comments), Swelling, Hives   ANGIOEDEMA ANGIOEDEMA ANGIOEDEMA ANGIOEDEMA ANGIOEDEMA ANGIOEDEMA   Ibuprofen Hives, Other (See Comments)   Ipratropium Hives, Other (See Comments)   Metoclopramide Hives, Other (See Comments)   Morphine And Related Hives   Other Hives   Oxymetazoline Hives, Other (See Comments)   Plavix [clopidogrel Bisulfate] Other (See Comments)   Confusion, lethargy, slurred speech   Vicodin [hydrocodone-acetaminophen] Hives, Swelling, Other (See Comments)   ANGIOEDEMA   Pheniramine Hives, Rash, Other (See Comments)       Medication List        Accurate as of 09/13/17  3:41 PM. Always use your most recent med list.          ALPRAZolam 1 MG tablet Commonly known as:  XANAX TAKE 1 TABLET BY MOUTH 3 TIMES A DAY AS NEEDED   ALPRAZolam 1 MG tablet Commonly known as:  XANAX TAKE 1 TABLET BY MOUTH THREE TIMES A DAY AS NEEDED FOR ANXIETY   ergocalciferol 50000 units capsule Commonly known as:  VITAMIN D2 Take 1 capsule (50,000 Units total) by mouth once a week.   esomeprazole 20 MG capsule Commonly known as:  NEXIUM Take 20 mg by mouth daily at 12 noon.   predniSONE 10 MG (21) Tbpk tablet Commonly known as:  STERAPRED UNI-PAK 21 TAB TAKE BY MOUTH AS DIRECTED ON DOSE PACK   sildenafil 20 MG tablet Commonly known as:  REVATIO Take 20- 60 mg po prior to sexual activity as needed   sildenafil 20 MG tablet Commonly known as:  REVATIO Take 20- 60 mg po prior to sexual activity as needed   triamcinolone cream 0.5 % Commonly known as:  KENALOG APPLY TO AFFECTED AREA TWICE A DAY       Allergies:  Allergies  Allergen Reactions  . Asa [Aspirin] Hives  . Acetaminophen Hives and Other (See Comments)  . Afrin [Nasal Spray] Hives  . Antihistamines, Chlorpheniramine-Type Hives  . Benadryl [Diphenhydramine Hcl] Hives  . Buprenorphine Hcl Hives and Other (See Comments)  . Carisoprodol Hives and Other (See Comments)  . Codeine Hives and Other (See Comments)  . Diphenhydramine Hcl Other (See  Comments) and Hives  . Hydrocodone-Acetaminophen Other (See Comments), Swelling and Hives    ANGIOEDEMA ANGIOEDEMA ANGIOEDEMA ANGIOEDEMA ANGIOEDEMA ANGIOEDEMA  . Ibuprofen Hives and Other (See Comments)  . Ipratropium Hives and Other (See Comments)  . Metoclopramide Hives and Other (See Comments)  . Morphine And Related Hives  . Other Hives  . Oxymetazoline Hives and Other (See Comments)  . Plavix [Clopidogrel Bisulfate] Other (See Comments)    Confusion, lethargy, slurred speech  . Vicodin  [Hydrocodone-Acetaminophen] Hives, Swelling and Other (See Comments)    ANGIOEDEMA  . Pheniramine Hives, Rash and Other (See Comments)    Past Medical History, Surgical history, Social history, and Family History were reviewed and updated.  Review of Systems: All other 10 point review of systems is negative.   Physical Exam:  weight is 188 lb 4 oz (85.4 kg). His oral temperature is 98.1 F (36.7 C). His blood pressure is 114/68 and his pulse is 56 (abnormal). His respiration is 18 and oxygen saturation is 100%.   Wt Readings from Last 3 Encounters:  09/13/17 188 lb 4 oz (85.4 kg)  08/09/17 193 lb (87.5 kg)  07/12/17 197 lb (89.4 kg)    Ocular: Sclerae unicteric, pupils equal, round and reactive to light Ear-nose-throat: Oropharynx clear, dentition fair Lymphatic: No cervical, supraclavicular or axillary adenopathy Lungs no rales or rhonchi, good excursion bilaterally Heart regular rate and rhythm, no murmur appreciated Abd soft, nontender, positive bowel sounds, no liver or spleen tip palpated on exam, no fluid wave  MSK no focal spinal tenderness, no joint edema Neuro: non-focal, well-oriented, appropriate affect Breasts: Deferred   Lab Results  Component Value Date   WBC 12.8 (H) 09/13/2017   HGB 14.7 09/13/2017   HCT 43.7 09/13/2017   MCV 87.1 09/13/2017   PLT 266 09/13/2017   Lab Results  Component Value Date   FERRITIN 20 (L) 08/09/2017   IRON 31 (L) 08/09/2017   TIBC 429 (H) 08/09/2017   UIBC 397 08/09/2017   IRONPCTSAT 7 (L) 08/09/2017   Lab Results  Component Value Date   RBC 5.02 09/13/2017   No results found for: KPAFRELGTCHN, LAMBDASER, KAPLAMBRATIO No results found for: IGGSERUM, IGA, IGMSERUM No results found for: Odetta Pink, SPEI   Chemistry      Component Value Date/Time   NA 140 09/13/2017 1423   NA 143 01/01/2017 1453   NA 139 11/12/2015 0945   K 4.2 09/13/2017 1423   K 4.3 01/01/2017  1453   K 3.7 11/12/2015 0945   CL 105 09/13/2017 1423   CL 103 01/01/2017 1453   CO2 28 09/13/2017 1423   CO2 27 01/01/2017 1453   CO2 22 11/12/2015 0945   BUN 14 09/13/2017 1423   BUN 18 01/01/2017 1453   BUN 13.1 11/12/2015 0945   CREATININE 1.10 09/13/2017 1423   CREATININE 1.0 01/01/2017 1453   CREATININE 0.9 11/12/2015 0945      Component Value Date/Time   CALCIUM 9.1 09/13/2017 1423   CALCIUM 8.8 01/01/2017 1453   CALCIUM 8.9 11/12/2015 0945   ALKPHOS 50 09/13/2017 1423   ALKPHOS 44 01/01/2017 1453   ALKPHOS 66 11/12/2015 0945   AST 28 09/13/2017 1423   AST 13 11/12/2015 0945   ALT 24 09/13/2017 1423   ALT 368 (HH) 01/01/2017 1453   ALT 14 11/12/2015 0945   BILITOT 0.6 09/13/2017 1423   BILITOT <0.22 11/12/2015 0945      Impression and Plan: Stuart Hill is  a pleasant 47 yo caucasian gentleman with polycythemia secondary to smoking. Hct today is 43.7%.  We will proceed with phlebotomy.  We will plan to see him back in another 1 month for follow-up.  They will contact our office with any questions or concerns. We can certainly see him sooner if need be.   [name], NP 8/19/20193:41 PM

## 2017-09-14 LAB — IRON AND TIBC
IRON: 34 ug/dL — AB (ref 42–163)
SATURATION RATIOS: 8 % — AB (ref 42–163)
TIBC: 436 ug/dL — ABNORMAL HIGH (ref 202–409)
UIBC: 403 ug/dL

## 2017-09-14 LAB — FERRITIN: FERRITIN: 14 ng/mL — AB (ref 24–336)

## 2017-10-08 ENCOUNTER — Other Ambulatory Visit: Payer: Self-pay | Admitting: Family Medicine

## 2017-10-08 DIAGNOSIS — N529 Male erectile dysfunction, unspecified: Secondary | ICD-10-CM

## 2017-10-11 ENCOUNTER — Ambulatory Visit (INDEPENDENT_AMBULATORY_CARE_PROVIDER_SITE_OTHER): Payer: Self-pay | Admitting: Family Medicine

## 2017-10-11 ENCOUNTER — Encounter: Payer: Self-pay | Admitting: Family Medicine

## 2017-10-11 VITALS — BP 134/76 | HR 51 | Temp 97.9°F | Resp 16 | Ht 75.0 in | Wt 189.0 lb

## 2017-10-11 DIAGNOSIS — R001 Bradycardia, unspecified: Secondary | ICD-10-CM

## 2017-10-11 DIAGNOSIS — M25512 Pain in left shoulder: Secondary | ICD-10-CM

## 2017-10-11 DIAGNOSIS — Z9089 Acquired absence of other organs: Secondary | ICD-10-CM

## 2017-10-11 DIAGNOSIS — R1310 Dysphagia, unspecified: Secondary | ICD-10-CM

## 2017-10-11 MED ORDER — MELOXICAM 15 MG PO TABS: ORAL_TABLET | ORAL | 3 refills | Status: DC

## 2017-10-11 NOTE — Progress Notes (Signed)
[place] at [facility] [address] [place], [place] [address] [phone]  Date:  10/11/2017   Name:  Stuart Hill   DOB:  [birth year]   MRN:  [id number]  PCP:  Darreld Mclean, MD    Chief Complaint: Arthritis (would like advise on medication to help with pain) and ENT referral (tonsillectomy 4/19, still having sore throat, clearing throat)   History of Present Illness:  Stuart Hill is a 47 y.o. very pleasant male patient who presents with the following:  History of anxiety, tonsillar hypertrophy s/p tonsillectomy in April He has polycythemia vera managed per hematology- per their note in August: Impression and Plan: [name] is a pleasant 47 yo caucasian gentleman with polycythemia secondary to smoking. Hct today is 43.7%.  We will proceed with phlebotomy.  We will plan to see him back in another 1 month for follow-up.   NCCSR: 10/04/2017  2  09/03/2017  Alprazolam 1 Mg Tablet  90.00 30 Je Cop  [id number]  [place] (1204)  1/2 6.00 LME Private Pay  [place]  09/03/2017  2  09/03/2017  Alprazolam 1 Mg Tablet  90.00 30 Je Cop  [id number]  [place] (1204)  0/2 6.00 LME Private Pay  [place]  08/03/2017  2  06/01/2017  Alprazolam 1 Mg Tablet  90.00 30 Je Cop  [id number]  [place] (1204)  2/2 6.00 LME Private Pay  [place]  07/04/2017  2  06/01/2017  Alprazolam 1 Mg Tablet  90.00 30 Je Cop  [id number]  [place] (1204)  1/2 6.00 LME Private Pay  [place]  06/01/2017  2  06/01/2017  Alprazolam 1 Mg Tablet  90.00 30 Je Cop  [id number]  [place] (1204)  0/2 6.00 LME Private Pay  [place]  05/03/2017  2  02/18/2017  Alprazolam 1 Mg Tablet  90.00 30 Je Cop  [id number]  [place] (1204)  2/2 6.00 LME Private Pay  [place]  04/30/2017  2  04/30/2017  Hydrocodone-Acetamn 7.5-325/15  473.00 8 Da Sho  [id number]  [place] ([id number])  0/0 29.56 MME Private Pay  [place]  04/02/2017  1  02/18/2017  Alprazolam 1 Mg Tablet  90.00 30 Je Cop  [id number]  [place] (1204)  1/2 6.00 LME Private Pay  [place]  02/18/2017  1  02/18/2017  Alprazolam  1 Mg Tablet  90.00 30 Je Cop  [id number]  [place] ([id number])  0/2      Stuart Hill is concerned that his voice seems to have changed a bit since he had his operation.  His wife Stuart Hill is here and agrees that it seems a little bit different  Also he seems to be getting food stuck in his throat while he is eating and he will cough up "chunks of food' during and after eating The type of food does not seem to matter Liquids do not get stuck He has been to see his ENT who did his surgery once, but did not follow-up again.  He does not wish to go back to Dr. Wilburn Cornelia- he would like to have a 2nd opinion   He also notes left shoulder pain that bothers him - this was better while he was on prednisone  He took some mobic from his wife Stuart Hill and tolerated this well, and it helped with his shoulder pain and his arthritis pain in his hands He has an allergy listed to aspirin and to ibuprofen but did not have any reaction to the mobic  He also wonders about his low pulse rate- he has not noted any sx of same but this was pointed out to him.  He is not sure if he should be concerned   Pulse Readings from Last 3 Encounters:  10/11/17 (!) 51  09/13/17 (!) 51  09/13/17 (!) 56     Patient Active Problem List   Diagnosis Date Noted  . Tonsillar hypertrophy 04/30/2017  . Obstructive sleep apnea 04/23/2017  . 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 Polycythemia vera ([place]) 05/03/2015  . Hemoptysis 04/03/2015  . Condylomata acuminata 01/18/2015  . Generalized anxiety disorder 03/05/2014    Past Medical History:  Diagnosis Date  . Allergy   . Anxiety   . Arthritis   . Bell's palsy   . Erythrocytosis 04/10/2015  . Hyperlipidemia   . Polycythemia vera ([place]) 05/03/2015    Past Surgical History:  Procedure Laterality Date  . DENTAL SURGERY    . HAND SURGERY    . TONSILLECTOMY Bilateral 04/30/2017   Procedure: TONSILLECTOMY;  Surgeon: Jerrell Belfast, MD;  Location: [facility];  Service: ENT;   Laterality: Bilateral;    Social History   Tobacco Use  . Smoking status: Current Every Day Smoker    Packs/day: 1.00    Years: 24.00    Pack years: 24.00    Types: Cigarettes  . Smokeless tobacco: Never Used  Substance Use Topics  . Alcohol use: Yes    Alcohol/week: 1.0 standard drinks    Types: 1 Cans of beer per week    Comment: 6-10 beer a week  . Drug use: No    Family History  Problem Relation Age of Onset  . COPD Mother   . Fibromyalgia Mother   . Heart disease Father   . Stroke Father   . Depression Brother   . Heart disease Brother   . Heart disease Maternal Grandmother   . Stroke Maternal Grandmother   . Heart disease Maternal Grandfather   . Stroke Maternal Grandfather   . Heart disease Paternal Grandmother   . Stroke Paternal Grandmother   . Heart disease Paternal Grandfather   . Stroke Paternal Grandfather   . Stroke Paternal Aunt     Allergies  Allergen Reactions  . Asa [Aspirin] Hives  . Acetaminophen Hives and Other (See Comments)  . Afrin [Nasal Spray] Hives  . Antihistamines, Chlorpheniramine-Type Hives  . Benadryl [Diphenhydramine Hcl] Hives  . Buprenorphine Hcl Hives and Other (See Comments)  . Carisoprodol Hives and Other (See Comments)  . Codeine Hives and Other (See Comments)  . Diphenhydramine Hcl Other (See Comments) and Hives  . Ibuprofen Hives and Other (See Comments)  . Ipratropium Hives and Other (See Comments)  . Metoclopramide Hives and Other (See Comments)  . Morphine And Related Hives  . Other Hives  . Oxymetazoline Hives and Other (See Comments)  . Plavix [Clopidogrel Bisulfate] Other (See Comments)    Confusion, lethargy, slurred speech  . Vicodin [Hydrocodone-Acetaminophen] Hives, Swelling and Other (See Comments)    ANGIOEDEMA  . Pheniramine Hives, Rash and Other (See Comments)    Medication list has been reviewed and updated.  Current Outpatient Medications on File Prior to Visit  Medication Sig Dispense Refill   . ALPRAZolam (XANAX) 1 MG tablet TAKE 1 TABLET BY MOUTH THREE TIMES A DAY AS NEEDED FOR ANXIETY 90 tablet 2  . ALPRAZolam (XANAX) 1 MG tablet TAKE 1 TABLET BY MOUTH 3 TIMES A DAY AS NEEDED    . ergocalciferol (VITAMIN D2)  50000 units capsule Take 1 capsule (50,000 Units total) by mouth once a week. 8 capsule 6  . esomeprazole (NEXIUM) 20 MG capsule Take 20 mg by mouth daily at 12 noon.    . sildenafil (REVATIO) 20 MG tablet Take 20- 60 mg po prior to sexual activity as needed    . triamcinolone cream (KENALOG) 0.5 % APPLY TO AFFECTED AREA TWICE A DAY  0   No current facility-administered medications on file prior to visit.     Review of Systems:  As per HPI- otherwise negative. No fever or chills No CP or SOB No palpitations    Physical Examination: Vitals:   10/11/17 1123  BP: 134/76  Pulse: (!) 51  Resp: 16  Temp: 97.9 F (36.6 C)  SpO2: 98%   Vitals:   10/11/17 1123  Weight: 189 lb (85.7 kg)  Height: 6\' 3"  (1.905 m)   Body mass index is 23.62 kg/m. Ideal Body Weight: Weight in (lb) to have BMI = 25: 199.6  GEN: WDWN, NAD, Non-toxic, A & O x 3, looks well.  I do not appreciate any voice abnl today  HEENT: Atraumatic, Normocephalic. Neck supple. No masses, No LAD.  Bilateral TM wnl, oropharynx normal.  PEERL,EOMI.   Ears and Nose: No external deformity. CV: RRR, No M/G/R. No JVD. No thrill. No extra heart sounds. PULM: CTA B, no wheezes, crackles, rhonchi. No retractions. No resp. distress. No accessory muscle use. EXTR: No c/c/e NEURO Normal gait.  PSYCH: Normally interactive. Conversant. Not depressed or anxious appearing.  Calm demeanor.  Left shoulder: he has tenderness over the RCT insertion and has reduced internal rotation of the left shoulder   Kalani did several push-ups in exam room and his pulse came to 85 BPM  Assessment and Plan: Pain in joint of left shoulder - Plan: meloxicam (MOBIC) 15 MG tablet, Ambulatory referral to Sports Medicine  Dysphagia,  unspecified type - Plan: Ambulatory referral to ENT  Bradycardia  S/P tonsillectomy  Koen would like to see a different ENT about his post-op concerns. He understands that I don't think starting over with a new doc at this juncture is the best idea but he does not think he can get along with Dr. Wilburn Cornelia.  Will refer to see Dr. Benjamine Mola for a 2nd opinion Reassured that his bradycardia seems to be benign.  We will continue to monitor this Referral to sports med concerning his shoulder pain Given rx for mobic as he reports taking this recently and tolerating well   Signed Lamar Blinks, MD

## 2017-10-11 NOTE — Patient Instructions (Addendum)
It was good to see you today- we can have you use some mobic as needed for your arthritis pain I gave you some exercises to try for your shoulder and I will refer you to sports med as well   I will refer you to another ENT doctor for a second opinion- likely Dr. Benjamine Mola in [facility]

## 2017-10-18 ENCOUNTER — Ambulatory Visit: Payer: Self-pay | Admitting: Family Medicine

## 2017-11-03 ENCOUNTER — Ambulatory Visit: Payer: Self-pay | Admitting: *Deleted

## 2017-11-03 NOTE — Telephone Encounter (Signed)
Pt called with having pain on his left chest when he takes a deep breath or cough only. It has been going on for about 2 weeks now.  He has not injured himself.  Has been going to the gym every day. He denies fever or cough, no cardiac symptoms, shortness of breath, headache, weakness, blurred vision. No respiratory Disease. He has mobic but not taking much of it. Appointment scheduled. Pt only wants to see his provider, so appointment scheduled as requested.  Advised to go to the emergency department if he feels like the pain is getting worst or any cardiac symptoms mentioned above. Pt voiced understanding.  Reason for Disposition . Chest pain(s) lasting a few seconds from coughing AND [2] persists > 3 days  Answer Assessment - Initial Assessment Questions 1. RESPIRATORY STATUS: "Describe your breathing?" (e.g., wheezing, shortness of breath, unable to speak, severe coughing)      Hurts to take a deep breath 2. ONSET: "When did this breathing problem begin?"      2 weeks 3. PATTERN "Does the difficult breathing come and go, or has it been constant since it started?"       constant 4. SEVERITY: "How bad is your breathing?" (e.g., mild, moderate, severe)    - MILD: No SOB at rest, mild SOB with walking, speaks normally in sentences, can lay down, no retractions, pulse < 100.    - MODERATE: SOB at rest, SOB with minimal exertion and prefers to sit, cannot lie down flat, speaks in phrases, mild retractions, audible wheezing, pulse 100-120.    - SEVERE: Very SOB at rest, speaks in single words, struggling to breathe, sitting hunched forward, retractions, pulse > 120      Hurts when taking a deep breath or cough on the left side 5. RECURRENT SYMPTOM: "Have you had difficulty breathing before?" If so, ask: "When was the last time?" and "What happened that time?"      no 6. CARDIAC HISTORY: "Do you have any history of heart disease?" (e.g., heart attack, angina, bypass surgery, angioplasty)       no 7. LUNG HISTORY: "Do you have any history of lung disease?"  (e.g., pulmonary embolus, asthma, emphysema)     no 8. CAUSE: "What do you think is causing the breathing problem?"      Not sure 9. OTHER SYMPTOMS: "Do you have any other symptoms? (e.g., dizziness, runny nose, cough, chest pain, fever)     no 11. TRAVEL: "Have you traveled out of the country in the last month?" (e.g., travel history, exposures)       no  Protocols used: CHEST PAIN-A-AH, BREATHING DIFFICULTY-A-AH

## 2017-11-04 NOTE — Telephone Encounter (Signed)
Appt scheduled 11/08/2017.

## 2017-11-06 NOTE — Progress Notes (Signed)
[facility] [address], [place], [place] [address] [phone]  Date:  11/08/2017   Name:  Stuart Hill   DOB:  February 25, 1970   MRN:  [id number]  PCP:  Darreld Mclean, MD    Chief Complaint: Chest Pain (pain when breathing in, worse when he took mobic-stopped taking and has gotten better- worried about PE)   History of Present Illness:  Stuart Hill is a 47 y.o. very pleasant male patient who presents with the following:  Here today with concern of pain with a deep breath.   Recent tonsillectomy for chronic tonsillitis - he is going to see Dr. Benjamine Mola for a 2nd opinion but still needs to schedule this  Dx of polycythemia with continued smoking  Anxiety   I last saw him in September with several concerns: Stuart Hill would like to see a different ENT about his post-op concerns. He understands that I don't think starting over with a new doc at this juncture is the best idea but he does not think he can get along with Dr. Wilburn Cornelia.  Will refer to see Dr. Benjamine Mola for a 2nd opinion Reassured that his bradycardia seems to be benign.  We will continue to monitor this Referral to sports med concerning his shoulder pain Given rx for mobic as he reports taking this recently and tolerating well   He is here today with concern of left chest pain with cough or deep breath- first noted about 3 weeks ago He stopped taking mobic 4-5 days ago and it seemed to get better He does have polycythemia and this made him worried about a blood clot He did have pulmonology nodule seen on CT in the past- however follow-up scan from 2/18 was reassuring: IMPRESSION: 1. Stable small subpleural nodules bilaterally from baseline examination of 11 months ago. These do not require any additional specific follow-up. This recommendation follows the consensus statement: Guidelines for Management of Small Pulmonary Nodules Detected on CT Images: From the  Fleischner Society 2017; Radiology 2017; 284:228-243. 2. No acute findings.  The sx seem to have gone away now- it stopped 3 days ago and he now feels fine He notes that even when the pain was acute he went to the gym and worked out hard with no problems.   Not having a cough in particular No fever He is not feeling SOB He has felt energetic and good  When he has been physically active he is not having any CP Never had a DVT or PE   Patient Active Problem List   Diagnosis Date Noted  . Tonsillar hypertrophy 04/30/2017  . Obstructive sleep apnea 04/23/2017  . 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 Polycythemia vera ([place]) 05/03/2015  . Hemoptysis 04/03/2015  . Condylomata acuminata 01/18/2015  . Generalized anxiety disorder 03/05/2014    Past Medical History:  Diagnosis Date  . Allergy   . Anxiety   . Arthritis   . Bell's palsy   . Erythrocytosis 04/10/2015  . Hyperlipidemia   . Polycythemia vera ([place]) 05/03/2015    Past Surgical History:  Procedure Laterality Date  . DENTAL SURGERY    . HAND SURGERY    . TONSILLECTOMY Bilateral 04/30/2017   Procedure: TONSILLECTOMY;  Surgeon: Jerrell Belfast, MD;  Location: [facility];  Service: ENT;  Laterality: Bilateral;    Social History   Tobacco Use  . Smoking status: Current Every Day Smoker    Packs/day: 1.00  Years: 24.00    Pack years: 24.00    Types: Cigarettes  . Smokeless tobacco: Never Used  Substance Use Topics  . Alcohol use: Yes    Alcohol/week: 1.0 standard drinks    Types: 1 Cans of beer per week    Comment: 6-10 beer a week  . Drug use: No    Family History  Problem Relation Age of Onset  . COPD Mother   . Fibromyalgia Mother   . Heart disease Father   . Stroke Father   . Depression Brother   . Heart disease Brother   . Heart disease Maternal Grandmother   . Stroke Maternal Grandmother   . Heart disease Maternal Grandfather   . Stroke Maternal Grandfather   . Heart disease Paternal  Grandmother   . Stroke Paternal Grandmother   . Heart disease Paternal Grandfather   . Stroke Paternal Grandfather   . Stroke Paternal Aunt     Allergies  Allergen Reactions  . Asa [Aspirin] Hives  . Acetaminophen Hives and Other (See Comments)  . Afrin [Nasal Spray] Hives  . Antihistamines, Chlorpheniramine-Type Hives  . Benadryl [Diphenhydramine Hcl] Hives  . Buprenorphine Hcl Hives and Other (See Comments)  . Carisoprodol Hives and Other (See Comments)  . Codeine Hives and Other (See Comments)  . Diphenhydramine Hcl Other (See Comments) and Hives  . Ibuprofen Hives and Other (See Comments)  . Ipratropium Hives and Other (See Comments)  . Metoclopramide Hives and Other (See Comments)  . Morphine And Related Hives  . Other Hives  . Oxymetazoline Hives and Other (See Comments)  . Plavix [Clopidogrel Bisulfate] Other (See Comments)    Confusion, lethargy, slurred speech  . Pheniramine Hives, Rash and Other (See Comments)    Medication list has been reviewed and updated.  Current Outpatient Medications on File Prior to Visit  Medication Sig Dispense Refill  . ALPRAZolam (XANAX) 1 MG tablet TAKE 1 TABLET BY MOUTH THREE TIMES A DAY AS NEEDED FOR ANXIETY 90 tablet 2  . ALPRAZolam (XANAX) 1 MG tablet TAKE 1 TABLET BY MOUTH 3 TIMES A DAY AS NEEDED    . ergocalciferol (VITAMIN D2) 50000 units capsule Take 1 capsule (50,000 Units total) by mouth once a week. 8 capsule 6  . esomeprazole (NEXIUM) 20 MG capsule Take 20 mg by mouth daily at 12 noon.    . sildenafil (REVATIO) 20 MG tablet Take 20- 60 mg po prior to sexual activity as needed    . sildenafil (REVATIO) 20 MG tablet TAKE 1-3 TABLETS BY MOUTH PRIOR TO SEXUAL ACTIVITY AS NEEDED 30 tablet 5  . triamcinolone cream (KENALOG) 0.5 % APPLY TO AFFECTED AREA TWICE A DAY  0  . meloxicam (MOBIC) 15 MG tablet Take 1/2 or 1 daily -Use as needed for joint pain (Patient not taking: Reported on 11/08/2017) 30 tablet 3   No current  facility-administered medications on file prior to visit.     Review of Systems:  As per HPI- otherwise negative. No fever or chills  No calf pain or swelling No hemoptysis  Physical Examination: Vitals:   11/08/17 1425  BP: 130/80  Pulse: 71  Resp: 16  Temp: 98.1 F (36.7 C)  SpO2: 98%   Vitals:   11/08/17 1425  Weight: 191 lb (86.6 kg)  Height: 6\' 3"  (1.905 m)   Body mass index is 23.87 kg/m. Ideal Body Weight: Weight in (lb) to have BMI = 25: 199.6  GEN: WDWN, NAD, Non-toxic, A & O x 3,  looks well  HEENT: Atraumatic, Normocephalic. Neck supple. No masses, No LAD.  Bilateral TM wnl, oropharynx normal.  PEERL,EOMI.   Ears and Nose: No external deformity. CV: RRR, No M/G/R. No JVD. No thrill. No extra heart sounds. No current chest wall tendernss  PULM: CTA B, no wheezes, crackles, rhonchi. No retractions. No resp. distress. No accessory muscle use. ABD: S, NT, ND. No rebound. No HSM. EXTR: No c/c/e NEURO Normal gait.  PSYCH: Normally interactive. Conversant. Not depressed or anxious appearing.  Calm demeanor.     EKG: SR, mild bradycardia, no concerning findings Compared with EKG from 8/17- no significant change  Assessment and Plan: Left-sided chest pain - Plan: EKG 12-Lead  Farooq is here today with left sided CP associated with cough that seems to have now resolved  Discussed in detail with pt EKG is normal today Discussed doing a D dimer but his probability of a PE seems low at this time and we do not want to get a false positive that leeds to an unneeded CT scan However asked him to seek care asap if any change or worsening   Signed Lamar Blinks, MD

## 2017-11-08 ENCOUNTER — Ambulatory Visit (INDEPENDENT_AMBULATORY_CARE_PROVIDER_SITE_OTHER): Payer: Self-pay | Admitting: Family Medicine

## 2017-11-08 ENCOUNTER — Encounter: Payer: Self-pay | Admitting: Family Medicine

## 2017-11-08 VITALS — BP 130/80 | HR 71 | Temp 98.1°F | Resp 16 | Ht 75.0 in | Wt 191.0 lb

## 2017-11-08 DIAGNOSIS — R079 Chest pain, unspecified: Secondary | ICD-10-CM

## 2017-11-08 NOTE — Patient Instructions (Addendum)
Please call Dr. Benjamine Mola to reschedule your appt with his office:  Address: [place], [place], [place] [address]  Phone: [phone]  Let me know if your chest pains return!

## 2017-11-15 ENCOUNTER — Inpatient Hospital Stay: Payer: Self-pay

## 2017-11-15 ENCOUNTER — Encounter: Payer: Self-pay | Admitting: Family

## 2017-11-15 ENCOUNTER — Other Ambulatory Visit: Payer: Self-pay

## 2017-11-15 ENCOUNTER — Inpatient Hospital Stay: Payer: Self-pay | Attending: Family | Admitting: Family

## 2017-11-15 VITALS — BP 139/80 | HR 68 | Temp 98.1°F | Resp 18 | Wt 190.8 lb

## 2017-11-15 DIAGNOSIS — D500 Iron deficiency anemia secondary to blood loss (chronic): Secondary | ICD-10-CM

## 2017-11-15 DIAGNOSIS — D751 Secondary polycythemia: Secondary | ICD-10-CM | POA: Insufficient documentation

## 2017-11-15 DIAGNOSIS — G510 Bell's palsy: Secondary | ICD-10-CM | POA: Insufficient documentation

## 2017-11-15 DIAGNOSIS — F17210 Nicotine dependence, cigarettes, uncomplicated: Secondary | ICD-10-CM | POA: Insufficient documentation

## 2017-11-15 DIAGNOSIS — Z79899 Other long term (current) drug therapy: Secondary | ICD-10-CM | POA: Insufficient documentation

## 2017-11-15 LAB — RETICULOCYTES
Immature Retic Fract: 2.9 % (ref 2.3–15.9)
RBC.: 5.27 MIL/uL (ref 4.22–5.81)
RETIC COUNT ABSOLUTE: 43.7 10*3/uL (ref 19.0–186.0)
Retic Ct Pct: 0.8 % (ref 0.4–3.1)

## 2017-11-15 LAB — CMP (CANCER CENTER ONLY)
ALBUMIN: 3.8 g/dL (ref 3.5–5.0)
ALT: 23 U/L (ref 10–47)
ANION GAP: 4 — AB (ref 5–15)
AST: 23 U/L (ref 11–38)
Alkaline Phosphatase: 66 U/L (ref 26–84)
BUN: 13 mg/dL (ref 7–22)
CALCIUM: 8.8 mg/dL (ref 8.0–10.3)
CO2: 26 mmol/L (ref 18–33)
CREATININE: 1.1 mg/dL (ref 0.60–1.20)
Chloride: 109 mmol/L — ABNORMAL HIGH (ref 98–108)
GLUCOSE: 100 mg/dL (ref 73–118)
Potassium: 4 mmol/L (ref 3.3–4.7)
Sodium: 139 mmol/L (ref 128–145)
TOTAL PROTEIN: 7.3 g/dL (ref 6.4–8.1)
Total Bilirubin: 0.5 mg/dL (ref 0.2–1.6)

## 2017-11-15 LAB — CBC WITH DIFFERENTIAL (CANCER CENTER ONLY)
Abs Immature Granulocytes: 0.02 10*3/uL (ref 0.00–0.07)
BASOS ABS: 0.1 10*3/uL (ref 0.0–0.1)
Basophils Relative: 1 %
Eosinophils Absolute: 0.2 10*3/uL (ref 0.0–0.5)
Eosinophils Relative: 1 %
HEMATOCRIT: 45.4 % (ref 39.0–52.0)
HEMOGLOBIN: 14.9 g/dL (ref 13.0–17.0)
IMMATURE GRANULOCYTES: 0 %
LYMPHS ABS: 2.9 10*3/uL (ref 0.7–4.0)
LYMPHS PCT: 24 %
MCH: 28.3 pg (ref 26.0–34.0)
MCHC: 32.8 g/dL (ref 30.0–36.0)
MCV: 86.1 fL (ref 80.0–100.0)
MONOS PCT: 7 %
Monocytes Absolute: 0.8 10*3/uL (ref 0.1–1.0)
NEUTROS ABS: 8 10*3/uL — AB (ref 1.7–7.7)
NEUTROS PCT: 67 %
NRBC: 0 % (ref 0.0–0.2)
Platelet Count: 287 10*3/uL (ref 150–400)
RBC: 5.27 MIL/uL (ref 4.22–5.81)
RDW: 14.3 % (ref 11.5–15.5)
WBC Count: 12 10*3/uL — ABNORMAL HIGH (ref 4.0–10.5)

## 2017-11-15 NOTE — Patient Instructions (Signed)

## 2017-11-15 NOTE — Progress Notes (Signed)
Stuart Hill presents today for phlebotomy per MD orders. Phlebotomy procedure started at 1526 and ended at 1534 via phlebotomy kit to right ac. 525 grams removed. Patient refused to stay for 30 minutes after procedure and was discharged to home with no complaints.  Patient tolerated procedure well. IV needle removed intact.

## 2017-11-15 NOTE — Progress Notes (Signed)
Hematology and Oncology Follow Up Visit  Stuart Hill [id number] Jan 13, 1971 47 y.o. 11/15/2017   Principle Diagnosis:  Secondary polycythemia JAK2 (-), smoker  LEFT Bell's palsy  Current Therapy:   Phlebotomy to maintain hematocrit below 42% Plavix 75 mg po q day- stoppedhimself and does not want to restart   Interim History:  Stuart Hill is here today with his wife for follow-up. He is symptomatic with blurry vision and fatigue Hct is 45.4% He is still smoking 1-1 1/2 ppd.  He went to see his PCP for chest pain last week but his EKG was normal. He is feeling ok today. He still has generalized joint aches and back pain.  No fever, chills, n/v, cough, rash, dizziness, SOB, palpitations, abdominal pain or changes in bowel or bladder habits.  No swelling, tenderness, numbness or tingling in his extremities.  No lymphadenopathy noted on exam.  No episodes of bleeding, no bruising or petechiae.  He has maintained a good appetite and is staying well hydrated. Her weight is stable.   ECOG Performance Status: 1 - Symptomatic but completely ambulatory  Medications:  Allergies as of 11/15/2017      Reactions   Asa [aspirin] Hives   Acetaminophen Hives, Other (See Comments)   Afrin [nasal Spray] Hives   Antihistamines, Chlorpheniramine-type Hives   Benadryl [diphenhydramine Hcl] Hives   Buprenorphine Hcl Hives, Other (See Comments)   Carisoprodol Hives, Other (See Comments)   Codeine Hives, Other (See Comments)   Diphenhydramine Hcl Other (See Comments), Hives   Ibuprofen Hives, Other (See Comments)   Ipratropium Hives, Other (See Comments)   Metoclopramide Hives, Other (See Comments)   Morphine And Related Hives   Other Hives   Oxymetazoline Hives, Other (See Comments)   Plavix [clopidogrel Bisulfate] Other (See Comments)   Confusion, lethargy, slurred speech   Pheniramine Hives, Rash, Other (See Comments)      Medication List        Accurate as of 11/15/17  3:08  PM. Always use your most recent med list.          ALPRAZolam 1 MG tablet Commonly known as:  XANAX TAKE 1 TABLET BY MOUTH THREE TIMES A DAY AS NEEDED FOR ANXIETY   ergocalciferol 50000 units capsule Commonly known as:  VITAMIN D2 Take 1 capsule (50,000 Units total) by mouth once a week.   esomeprazole 20 MG capsule Commonly known as:  NEXIUM Take 20 mg by mouth daily at 12 noon.   meloxicam 15 MG tablet Commonly known as:  MOBIC Take 1/2 or 1 daily -Use as needed for joint pain   sildenafil 20 MG tablet Commonly known as:  REVATIO Take 20- 60 mg po prior to sexual activity as needed   triamcinolone cream 0.5 % Commonly known as:  KENALOG APPLY TO AFFECTED AREA TWICE A DAY       Allergies:  Allergies  Allergen Reactions  . Asa [Aspirin] Hives  . Acetaminophen Hives and Other (See Comments)  . Afrin [Nasal Spray] Hives  . Antihistamines, Chlorpheniramine-Type Hives  . Benadryl [Diphenhydramine Hcl] Hives  . Buprenorphine Hcl Hives and Other (See Comments)  . Carisoprodol Hives and Other (See Comments)  . Codeine Hives and Other (See Comments)  . Diphenhydramine Hcl Other (See Comments) and Hives  . Ibuprofen Hives and Other (See Comments)  . Ipratropium Hives and Other (See Comments)  . Metoclopramide Hives and Other (See Comments)  . Morphine And Related Hives  . Other Hives  . Oxymetazoline Hives and  Other (See Comments)  . Plavix [Clopidogrel Bisulfate] Other (See Comments)    Confusion, lethargy, slurred speech  . Pheniramine Hives, Rash and Other (See Comments)    Past Medical History, Surgical history, Social history, and Family History were reviewed and updated.  Review of Systems: All other 10 point review of systems is negative.   Physical Exam:  weight is 190 lb 12 oz (86.5 kg). His oral temperature is 98.1 F (36.7 C). His blood pressure is 139/80 and his pulse is 68. His respiration is 18 and oxygen saturation is 98%.   Wt Readings from Last  3 Encounters:  11/15/17 190 lb 12 oz (86.5 kg)  11/08/17 191 lb (86.6 kg)  10/11/17 189 lb (85.7 kg)    Ocular: Sclerae unicteric, pupils equal, round and reactive to light Ear-nose-throat: Oropharynx clear, dentition fair Lymphatic: No cervical, supraclavicular or axillary adenopathy Lungs no rales or rhonchi, good excursion bilaterally Heart regular rate and rhythm, no murmur appreciated Abd soft, nontender, positive bowel sounds, no liver or spleen tip palpated on exam, no fluid wave  MSK no focal spinal tenderness, no joint edema Neuro: non-focal, well-oriented, appropriate affect Breasts: Deferred   Lab Results  Component Value Date   WBC 12.0 (H) 11/15/2017   HGB 14.9 11/15/2017   HCT 45.4 11/15/2017   MCV 86.1 11/15/2017   PLT 287 11/15/2017   Lab Results  Component Value Date   FERRITIN 14 (L) 09/13/2017   IRON 34 (L) 09/13/2017   TIBC 436 (H) 09/13/2017   UIBC 403 09/13/2017   IRONPCTSAT 8 (L) 09/13/2017   Lab Results  Component Value Date   RETICCTPCT 0.8 11/15/2017   RBC 5.27 11/15/2017   RBC 5.27 11/15/2017   No results found for: KPAFRELGTCHN, LAMBDASER, KAPLAMBRATIO No results found for: IGGSERUM, IGA, IGMSERUM No results found for: Odetta Pink, SPEI   Chemistry      Component Value Date/Time   NA 140 09/13/2017 1423   NA 143 01/01/2017 1453   NA 139 11/12/2015 0945   K 4.2 09/13/2017 1423   K 4.3 01/01/2017 1453   K 3.7 11/12/2015 0945   CL 105 09/13/2017 1423   CL 103 01/01/2017 1453   CO2 28 09/13/2017 1423   CO2 27 01/01/2017 1453   CO2 22 11/12/2015 0945   BUN 14 09/13/2017 1423   BUN 18 01/01/2017 1453   BUN 13.1 11/12/2015 0945   CREATININE 1.10 09/13/2017 1423   CREATININE 1.0 01/01/2017 1453   CREATININE 0.9 11/12/2015 0945      Component Value Date/Time   CALCIUM 9.1 09/13/2017 1423   CALCIUM 8.8 01/01/2017 1453   CALCIUM 8.9 11/12/2015 0945   ALKPHOS 50 09/13/2017 1423    ALKPHOS 44 01/01/2017 1453   ALKPHOS 66 11/12/2015 0945   AST 28 09/13/2017 1423   AST 13 11/12/2015 0945   ALT 24 09/13/2017 1423   ALT 368 (HH) 01/01/2017 1453   ALT 14 11/12/2015 0945   BILITOT 0.6 09/13/2017 1423   BILITOT <0.22 11/12/2015 0945      Impression and Plan: Stuart Hill is a very pleasant 47 yo caucasian gentleman with polycythemia secondary to smoking. He is symptomatic with fatigue and blurred vision.  Hct is 45.4% so we will proceed with phlebotomy today.  We will see him in another month for follow-up.   They will contact our office with any questions or concerns. We can certainly see him sooner if need be.   Judson Roch  [place], NP 10/21/20193:08 PM

## 2017-11-16 LAB — IRON AND TIBC
IRON: 37 ug/dL — AB (ref 42–163)
SATURATION RATIOS: 8 % — AB (ref 42–163)
TIBC: 455 ug/dL — ABNORMAL HIGH (ref 202–409)
UIBC: 418 ug/dL

## 2017-11-16 LAB — FERRITIN: Ferritin: 7 ng/mL — ABNORMAL LOW (ref 24–336)

## 2017-11-26 ENCOUNTER — Other Ambulatory Visit (HOSPITAL_COMMUNITY): Payer: Self-pay | Admitting: Specialist

## 2017-11-26 DIAGNOSIS — R1319 Other dysphagia: Secondary | ICD-10-CM

## 2017-11-29 ENCOUNTER — Other Ambulatory Visit: Payer: Self-pay | Admitting: Family Medicine

## 2017-11-29 DIAGNOSIS — F411 Generalized anxiety disorder: Secondary | ICD-10-CM

## 2017-11-29 NOTE — Telephone Encounter (Signed)
Reviewed [facility]:  11/02/2017  2   09/03/2017  Alprazolam 1 Mg Tablet  90.00 30 Je Cop  [id number]  [place] (1204)  2/2 6.00 LME Private Pay  [facility]  10/04/2017  2   09/03/2017  Alprazolam 1 Mg Tablet  90.00 30 Je Cop  [id number]  [place] (1204)  1/2 6.00 LME Private Pay  [facility]  09/03/2017  2   09/03/2017  Alprazolam 1 Mg Tablet  90.00 30 Je Cop  [id number]  [place] (1204)  0/2 6.00 LME Private Pay  [facility]  08/03/2017  2   06/01/2017  Alprazolam 1 Mg Tablet  90.00 30 Je Cop  [id number]  [place] (1204)  2/2 6.00 LME Private Pay  [facility]  07/04/2017  2   06/01/2017  Alprazolam 1 Mg Tablet  90.00 30 Je Cop  [id number]  [place] (1204)  1/2 6.00 LME Private Pay  [place]  06/01/2017  2   06/01/2017  Alprazolam 1 Mg Tablet  90.00 30 Je Cop  [id number]  [place] (1204)  0/2 6.00 LME Private Pay  [facility]  05/03/2017  2   02/18/2017  Alprazolam 1 Mg Tablet  90.00 30 Je Cop  [id number]  [place] (1204)  2/2 6.00 LME Private Pay  [place]  04/30/2017  2   04/30/2017  Hydrocodone-Acetamn 7.5-325/15  473.00 8 Da Sho  [id number]  [place] (2805)  0/0 29.56 MME Private Pay  [place]  04/02/2017  1   02/18/2017  Alprazolam 1 Mg Tablet  90.00 30 Je Cop  [id number]  [place] (1204)  1/2 6.00 LME Private Pay  [facility]  02/18/2017  1   02/18/2017  Alprazolam 1 Mg Tablet  90.00 30 Je Cop  [id number]  [place] (3967)  0/2      Ok to refill

## 2017-12-01 ENCOUNTER — Other Ambulatory Visit: Payer: Self-pay

## 2017-12-01 ENCOUNTER — Encounter (HOSPITAL_COMMUNITY): Payer: Self-pay | Admitting: Speech Pathology

## 2017-12-01 ENCOUNTER — Ambulatory Visit (HOSPITAL_COMMUNITY)
Admission: RE | Admit: 2017-12-01 | Discharge: 2017-12-01 | Disposition: A | Discharge status: Home / Self Care | Payer: Self-pay | Source: Ambulatory Visit | Attending: Otolaryngology | Admitting: Otolaryngology

## 2017-12-01 ENCOUNTER — Ambulatory Visit (HOSPITAL_COMMUNITY): Payer: Self-pay | Attending: Otolaryngology | Admitting: Speech Pathology

## 2017-12-01 DIAGNOSIS — R1319 Other dysphagia: Secondary | ICD-10-CM

## 2017-12-01 DIAGNOSIS — R1312 Dysphagia, oropharyngeal phase: Secondary | ICD-10-CM | POA: Insufficient documentation

## 2017-12-01 NOTE — Therapy (Signed)
[place], [place], [address] Phone: [phone]   Fax:  [phone]  Modified Barium Swallow  Patient Details  Name: Stuart Hill MRN: [id number] Date of Birth: 10/12/1970 No data recorded  Encounter Date: 12/01/2017  End of Session - 12/01/17 1548    Visit Number  1    Number of Visits  1    Authorization Type  No insurance coverage    SLP Start Time  1330    SLP Stop Time   1500    SLP Time Calculation (min)  90 min    Activity Tolerance  Patient tolerated treatment well       Past Medical History:  Diagnosis Date  . Allergy   . Anxiety   . Arthritis   . Bell's palsy   . Erythrocytosis 04/10/2015  . Hyperlipidemia   . Polycythemia vera ([place]) 05/03/2015    Past Surgical History:  Procedure Laterality Date  . DENTAL SURGERY    . HAND SURGERY    . TONSILLECTOMY Bilateral 04/30/2017   Procedure: TONSILLECTOMY;  Surgeon: Stuart Belfast, MD;  Location: [facility];  Service: ENT;  Laterality: Bilateral;    There were no vitals filed for this visit.  Subjective Assessment - 12/01/17 1541    Subjective  "I feel something right here." (points to sternal notch)    Patient is accompained by:  Family member    Special Tests  MBSS    Currently in Pain?  No/denies          General - 12/01/17 1541      General Information   Date of Onset  05/26/17    HPI  [name] is a 47 yo male who was referred by Dr. Benjamine Mola for MBSS due to Pt reports of globus sensation and dysphagia following tonsillectomy last spring (04/2017). Mr. Karapetyan underwent tonsillectomy with Dr. Wilburn Cornelia and was seen by Dr. Benjamine Mola of a second opinion. Past medical history is significant for Bell's Palsy (resolved), anxiety, and polycythemia secondary to smoking. He reports that he is tolerating liquids well, but that solid foods often become "stuck" and he has to cough it back up (especially rice). He also indicates  that his "voice" seems a little different since his surgery. He is worried that he may have cancer and is bothered by his difficulty swallowing solid foods. He is accompanied to today's appointment by his wife.    Type of Study  MBS-Modified Barium Swallow Study    Diet Prior to this Study  Regular;Thin liquids   difficulty with rice   Temperature Spikes Noted  No    Respiratory Status  Room air    History of Recent Intubation  No    Behavior/Cognition  Alert;Cooperative;Pleasant mood    Oral Cavity Assessment  Within Functional Limits    Oral Care Completed by SLP  No    Oral Cavity - Dentition  Adequate natural dentition    Vision  Functional for self feeding    Self-Feeding Abilities  Able to feed self    Patient Positioning  Upright in chair    Baseline Vocal Quality  Normal    Volitional Cough  Strong    Volitional Swallow  Able to elicit    Anatomy  Within functional limits    Pharyngeal Secretions  Not observed secondary MBS         Oral Preparation/Oral Phase - 12/01/17 1543      Oral Preparation/Oral  Phase   Oral Phase  Within functional limits      Oral - Solids   Oral - Pill  Delayed A-P transit      Electrical stimulation - Oral Phase   Was Electrical Stimulation Used  No       Pharyngeal Phase - 12/01/17 1544      Pharyngeal Phase   Pharyngeal Phase  Impaired      Pharyngeal - Thin   Pharyngeal- Thin Cup  Reduced tongue base retraction;Pharyngeal residue - valleculae   BOT and vallecular residue   Pharyngeal- Thin Straw  Penetration/Aspiration during swallow;Reduced tongue base retraction;Pharyngeal residue - valleculae;Pharyngeal residue - pyriform    Pharyngeal  Material does not enter airway;Material enters airway, remains ABOVE vocal cords then ejected out      Pharyngeal - Solids   Pharyngeal- Puree  Within functional limits;Reduced tongue base retraction;Pharyngeal residue - valleculae   mild BOT and vallecular residue   Pharyngeal- Regular   Within functional limits    Pharyngeal- Multi-consistency  Penetration/Aspiration during swallow;Reduced tongue base retraction;Pharyngeal residue - valleculae    Pharyngeal  Material enters airway, CONTACTS cords and then ejected out    Pharyngeal- Pill  Within functional limits      Electrical Stimulation - Pharyngeal Phase   Was Electrical Stimulation Used  No       Cricopharyngeal Phase - 12/01/17 1546      Cervical Esophageal Phase   Cervical Esophageal Phase  Within functional limits   mild delayed emptying noted in distal esophagus       Plan - 12/01/17 1548    Clinical Impression Statement Pt presents with min oropharyngeal phase dysphagia characterized by reduced tongue base retraction during the swallow resulting in mild base of tonge and vallecular residue across textures and consistencies. Trace penetration of thin liquids noted x 2 without aspiration (once when taking straw sips and once when swallowing mixed consistency of solid/liquid). Mild base of tongue residuals were observed to spill to lateral channels and pyriforms but were cleared with a repeat swallow. Pt pointed to sternal notch area during the study to indicate source of globus, however Pt shown the imaging to reveal mild residuals along the base of tongue/vallecular space.   Pt's symptoms seemed consistent with a possible Zenker's diverticulum, however nothing visualized and radiologist confirmed by SLP request.  The study and the imaging was reviewed at length with the Pt and his wife. He voiced many concerns, mostly fearing cancer followed by fear of choking. SLP assured Pt that he was in fact protecting his airway well and risk for aspiration is minimal. SLP suspects that Pt's globus sensation is commensurate with min base of tongue and vallecular residuals. Query whether excision of hypertrophied tonsils is source for residuals- suspect anatomical changes in oropharynx (near BOT and uvulae and approximation with  posterior pharyngeal wall) has changed bolus flow during swallow. Also question whether scar tissue and/or edema post tonsillectomy could be negatively impacting swallow. Pt is 6 months post op, so would expect edema to be resolved- will defer to ENT. SLP provided Pt with lingual strengthening and retraction exercises to see if this would facilitate improvement. He was also encouraged to continue eating a variety of solid foods and assured him his risk for aspiration is minimal. Pt has been told that he has reflux and does chronically clear his throat so he was advised to attempt throat clearing with a dry swallow or a sip of water. He was also advised to quit  smoking and limit caffeine intake. He reports increased phlegm/mucous when drinking beer, however suspect this may be due to carbonation. Velopharyngeal closure appeared adequate during MBSS, however Pt does indicate increased hypernasality when fatigued. This also could be related to uvulae adjustment with removal of tonsils. Pt will follow up with ENT. No further SLP services indicated at this time, however Pt was given my contact information should he have further questions.    Consulted and Agree with Plan of Care  Patient       Patient will benefit from skilled therapeutic intervention in order to improve the following deficits and impairments:   Dysphagia, oropharyngeal phase    Recommendations/Treatment - 12/01/17 1547      Swallow Evaluation Recommendations   Recommended Consults  Consider ENT evaluation   already followed by ENT   SLP Diet Recommendations  Age appropriate regular;Thin    Liquid Administration via  Cup;Straw    Medication Administration  Whole meds with liquid    Supervision  Patient able to self feed    Compensations  Multiple dry swallows after each bite/sip    Postural Changes  Seated upright at 90 degrees;Remain upright for at least 30 minutes after feeds/meals       Prognosis - 12/01/17 1547       Prognosis   Prognosis for Safe Diet Advancement  Good      Individuals Consulted   Consulted and Agree with Results and Recommendations  Patient    Report Sent to   Referring physician       Problem List Patient Active Problem List   Diagnosis Date Noted  . Tonsillar hypertrophy 04/30/2017  . Obstructive sleep apnea 04/23/2017  . 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 Polycythemia vera ([place]) 05/03/2015  . Hemoptysis 04/03/2015  . Condylomata acuminata 01/18/2015  . Generalized anxiety disorder 03/05/2014   Thank you,  Genene Churn, [place]  [facility] 12/01/2017, 3:50 PM  [facility] [address] [place], [place], [address] Phone: [phone]   Fax:  [phone]  Name: DIMA FERRUFINO MRN: [id number] Date of Birth: 1970-10-26

## 2017-12-20 ENCOUNTER — Inpatient Hospital Stay: Payer: Self-pay | Attending: Family | Admitting: Hematology & Oncology

## 2017-12-20 ENCOUNTER — Inpatient Hospital Stay: Payer: Self-pay

## 2017-12-20 ENCOUNTER — Encounter: Payer: Self-pay | Admitting: Hematology & Oncology

## 2017-12-20 ENCOUNTER — Other Ambulatory Visit: Payer: Self-pay

## 2017-12-20 VITALS — BP 125/81 | HR 52 | Temp 98.1°F | Resp 18 | Wt 192.0 lb

## 2017-12-20 DIAGNOSIS — D500 Iron deficiency anemia secondary to blood loss (chronic): Secondary | ICD-10-CM

## 2017-12-20 DIAGNOSIS — D751 Secondary polycythemia: Secondary | ICD-10-CM

## 2017-12-20 DIAGNOSIS — D45 Polycythemia vera: Secondary | ICD-10-CM | POA: Insufficient documentation

## 2017-12-20 LAB — CMP (CANCER CENTER ONLY)
ALK PHOS: 49 U/L (ref 26–84)
ALT: 20 U/L (ref 10–47)
ANION GAP: 3 — AB (ref 5–15)
AST: 25 U/L (ref 11–38)
Albumin: 3.8 g/dL (ref 3.5–5.0)
BILIRUBIN TOTAL: 0.7 mg/dL (ref 0.2–1.6)
BUN: 9 mg/dL (ref 7–22)
CALCIUM: 9.2 mg/dL (ref 8.0–10.3)
CO2: 32 mmol/L (ref 18–33)
Chloride: 105 mmol/L (ref 98–108)
Creatinine: 0.8 mg/dL (ref 0.60–1.20)
Glucose, Bld: 106 mg/dL (ref 73–118)
Potassium: 4.4 mmol/L (ref 3.3–4.7)
Sodium: 140 mmol/L (ref 128–145)
TOTAL PROTEIN: 7.3 g/dL (ref 6.4–8.1)

## 2017-12-20 LAB — CBC WITH DIFFERENTIAL (CANCER CENTER ONLY)
Abs Immature Granulocytes: 0.09 10*3/uL — ABNORMAL HIGH (ref 0.00–0.07)
BASOS PCT: 1 %
Basophils Absolute: 0.1 10*3/uL (ref 0.0–0.1)
EOS ABS: 0.1 10*3/uL (ref 0.0–0.5)
Eosinophils Relative: 1 %
HCT: 45.5 % (ref 39.0–52.0)
Hemoglobin: 14.3 g/dL (ref 13.0–17.0)
Immature Granulocytes: 1 %
Lymphocytes Relative: 22 %
Lymphs Abs: 2.2 10*3/uL (ref 0.7–4.0)
MCH: 27.8 pg (ref 26.0–34.0)
MCHC: 31.4 g/dL (ref 30.0–36.0)
MCV: 88.3 fL (ref 80.0–100.0)
MONO ABS: 0.8 10*3/uL (ref 0.1–1.0)
Monocytes Relative: 8 %
NEUTROS PCT: 67 %
NRBC: 0 % (ref 0.0–0.2)
Neutro Abs: 6.6 10*3/uL (ref 1.7–7.7)
Platelet Count: 283 10*3/uL (ref 150–400)
RBC: 5.15 MIL/uL (ref 4.22–5.81)
RDW: 15 % (ref 11.5–15.5)
WBC Count: 9.8 10*3/uL (ref 4.0–10.5)

## 2017-12-20 LAB — RETICULOCYTES
Immature Retic Fract: 8.7 % (ref 2.3–15.9)
RBC.: 5.15 MIL/uL (ref 4.22–5.81)
RETIC CT PCT: 1 % (ref 0.4–3.1)
Retic Count, Absolute: 53 10*3/uL (ref 19.0–186.0)

## 2017-12-20 NOTE — Progress Notes (Signed)
Hematology and Oncology Follow Up Visit  Stuart Hill [id number] 01-Oct-1970 47 y.o. 12/20/2017   Principle Diagnosis:  Secondary polycythemia JAK2 (-), smoker  LEFT Bell's palsy  Current Therapy:   Phlebotomy to maintain hematocrit below 42% Plavix 75 mg po q day- stoppedhimself and does not want to restart   Interim History:  Stuart Hill is here today with his wife for follow-up.  He is doing pretty well.  As always, he is still working.  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  He has phlebotomies to keep his hematocrit below 42%.  This seems to help quite a lot.  He does not want to go back onto Plavix.  He understands the risk that he takes by not being on Plavix.  He is not having any headaches.  He did have an episode of shingles and Bell's palsy about a year or so ago.  He is recovered from this.  He is still smoking.  He probably smokes little over one pack a day.  He has an incredible tattoo on his left side.  I am incredibly impressed by the detail of this tattoo.  His iron studies back in October showed a ferritin of 7 with an iron saturation of only 8%.  He and his wife will be going over to his family is for Thanksgiving.  Overall, his performance status is ECOG 1.   Medications:  Allergies as of 12/20/2017      Reactions   Asa [aspirin] Hives   Acetaminophen Hives, Other (See Comments)   Afrin [nasal Spray] Hives   Antihistamines, Chlorpheniramine-type Hives   Benadryl [diphenhydramine Hcl] Hives   Buprenorphine Hcl Hives, Other (See Comments)   Carisoprodol Hives, Other (See Comments)   Codeine Hives, Other (See Comments)   Diphenhydramine Hcl Other (See Comments), Hives   Ibuprofen Hives, Other (See Comments)   Ipratropium Hives, Other (See Comments)   Metoclopramide Hives, Other (See Comments)   Morphine And Related Hives   Other Hives   Oxymetazoline Hives, Other (See Comments)   Plavix [clopidogrel Bisulfate] Other (See Comments)   Confusion, lethargy, slurred speech   Pheniramine Hives, Rash, Other (See Comments)      Medication List        Accurate as of 12/20/17  4:04 PM. Always use your most recent med list.          ALPRAZolam 1 MG tablet Commonly known as:  XANAX TAKE 1 TABLET BY MOUTH THREE TIMES A DAY AS NEEDED FOR ANXIETY   ergocalciferol 1.25 MG (50000 UT) capsule Commonly known as:  VITAMIN D2 Take 1 capsule (50,000 Units total) by mouth once a week.   esomeprazole 20 MG capsule Commonly known as:  NEXIUM Take 20 mg by mouth daily at 12 noon.   meloxicam 15 MG tablet Commonly known as:  MOBIC Take 1/2 or 1 daily -Use as needed for joint pain   sildenafil 20 MG tablet Commonly known as:  REVATIO Take 20- 60 mg po prior to sexual activity as needed   triamcinolone cream 0.5 % Commonly known as:  KENALOG APPLY TO AFFECTED AREA TWICE A DAY       Allergies:  Allergies  Allergen Reactions  . Asa [Aspirin] Hives  . Acetaminophen Hives and Other (See Comments)  . Afrin [Nasal Spray] Hives  . Antihistamines, Chlorpheniramine-Type Hives  . Benadryl [Diphenhydramine Hcl] Hives  . Buprenorphine Hcl Hives and Other (  See Comments)  . Carisoprodol Hives and Other (See Comments)  . Codeine Hives and Other (See Comments)  . Diphenhydramine Hcl Other (See Comments) and Hives  . Ibuprofen Hives and Other (See Comments)  . Ipratropium Hives and Other (See Comments)  . Metoclopramide Hives and Other (See Comments)  . Morphine And Related Hives  . Other Hives  . Oxymetazoline Hives and Other (See Comments)  . Plavix [Clopidogrel Bisulfate] Other (See Comments)    Confusion, lethargy, slurred speech  . Pheniramine Hives, Rash and Other (See Comments)    Past Medical History, Surgical history, Social history, and Family History were reviewed and updated.  Review of Systems: Review of Systems  Constitutional: Negative.   HENT: Negative.    Eyes: Negative.   Respiratory: Negative.   Cardiovascular: Negative.   Gastrointestinal: Negative.   Genitourinary: Negative.   Musculoskeletal: Negative.   Skin: Negative.   Neurological: Negative.   Endo/Heme/Allergies: Negative.   Psychiatric/Behavioral: Negative.       Physical Exam:  weight is 192 lb (87.1 kg). His oral temperature is 98.1 F (36.7 C). His blood pressure is 125/81 and his pulse is 52 (abnormal). His respiration is 18 and oxygen saturation is 100%.   Wt Readings from Last 3 Encounters:  12/20/17 192 lb (87.1 kg)  11/15/17 190 lb 12 oz (86.5 kg)  11/08/17 191 lb (86.6 kg)    Physical Exam  Constitutional: He is oriented to person, place, and time.  HENT:  Head: Normocephalic and atraumatic.  Mouth/Throat: Oropharynx is clear and moist.  Eyes: Pupils are equal, round, and reactive to light. EOM are normal.  Neck: Normal range of motion.  Cardiovascular: Normal rate, regular rhythm and normal heart sounds.  Pulmonary/Chest: Effort normal and breath sounds normal.  Abdominal: Soft. Bowel sounds are normal.  Musculoskeletal: Normal range of motion. He exhibits no edema, tenderness or deformity.  Lymphadenopathy:    He has no cervical adenopathy.  Neurological: He is alert and oriented to person, place, and time.  Skin: Skin is warm and dry. No rash noted. No erythema.  Psychiatric: He has a normal mood and affect. His behavior is normal. Judgment and thought content normal.  Vitals reviewed.    Lab Results  Component Value Date   WBC 9.8 12/20/2017   HGB 14.3 12/20/2017   HCT 45.5 12/20/2017   MCV 88.3 12/20/2017   PLT 283 12/20/2017   Lab Results  Component Value Date   FERRITIN 7 (L) 11/15/2017   IRON 37 (L) 11/15/2017   TIBC 455 (H) 11/15/2017   UIBC 418 11/15/2017   IRONPCTSAT 8 (L) 11/15/2017   Lab Results  Component Value Date   RETICCTPCT 1.0 12/20/2017   RBC 5.15 12/20/2017   RBC 5.15 12/20/2017   No results found for:  KPAFRELGTCHN, LAMBDASER, KAPLAMBRATIO No results found for: IGGSERUM, IGA, IGMSERUM No results found for: Odetta Pink, SPEI   Chemistry      Component Value Date/Time   NA 139 11/15/2017 1436   NA 143 01/01/2017 1453   NA 139 11/12/2015 0945   K 4.0 11/15/2017 1436   K 4.3 01/01/2017 1453   K 3.7 11/12/2015 0945   CL 109 (H) 11/15/2017 1436   CL 103 01/01/2017 1453   CO2 26 11/15/2017 1436   CO2 27 01/01/2017 1453   CO2 22 11/12/2015 0945   BUN 13 11/15/2017 1436   BUN 18 01/01/2017 1453   BUN 13.1 11/12/2015 0945   CREATININE  1.10 11/15/2017 1436   CREATININE 1.0 01/01/2017 1453   CREATININE 0.9 11/12/2015 0945      Component Value Date/Time   CALCIUM 8.8 11/15/2017 1436   CALCIUM 8.8 01/01/2017 1453   CALCIUM 8.9 11/12/2015 0945   ALKPHOS 66 11/15/2017 1436   ALKPHOS 44 01/01/2017 1453   ALKPHOS 66 11/12/2015 0945   AST 23 11/15/2017 1436   AST 13 11/12/2015 0945   ALT 23 11/15/2017 1436   ALT 368 (HH) 01/01/2017 1453   ALT 14 11/12/2015 0945   BILITOT 0.5 11/15/2017 1436   BILITOT <0.22 11/12/2015 0945      Impression and Plan: Mr. Arora is a very pleasant 47 yo white male with history of polycythemia vera.  I realize that he is Jak 2-.  However, he has a very low erythropoietin level.  Despite being iron deficient, his hemoglobin tends to trend upward.  We will phlebotomize him today.  I think we can then plan to get him back in about 3 months.  I am sure we will have to phlebotomize him then but at least we can get him through the holidays and winter time.     Volanda Napoleon, MD 11/25/20194:04 PM

## 2017-12-20 NOTE — Patient Instructions (Signed)
Therapeutic Phlebotomy Therapeutic phlebotomy is the controlled removal of blood from a person's body for the purpose of treating a medical condition. The procedure is similar to donating blood. Usually, about a pint (470 mL, or 0.47L) of blood is removed. The average adult has 9-12 pints (4.3-5.7 L) of blood. Therapeutic phlebotomy may be used to treat the following medical conditions:  Hemochromatosis. This is a condition in which the blood contains too much iron.  Polycythemia vera. This is a condition in which the blood contains too many red blood cells.  Porphyria cutanea tarda. This is a disease in which an important part of hemoglobin is not made properly. It results in the buildup of abnormal amounts of porphyrins in the body.  Sickle cell disease. This is a condition in which the red blood cells form an abnormal crescent shape rather than a round shape.  Tell a health care provider about:  Any allergies you have.  All medicines you are taking, including vitamins, herbs, eye drops, creams, and over-the-counter medicines.  Any problems you or family members have had with anesthetic medicines.  Any blood disorders you have.  Any surgeries you have had.  Any medical conditions you have. What are the risks? Generally, this is a safe procedure. However, problems may occur, including:  Nausea or light-headedness.  Low blood pressure.  Soreness, bleeding, swelling, or bruising at the needle insertion site.  Infection.  What happens before the procedure?  Follow instructions from your health care provider about eating or drinking restrictions.  Ask your health care provider about changing or stopping your regular medicines. This is especially important if you are taking diabetes medicines or blood thinners.  Wear clothing with sleeves that can be raised above the elbow.  Plan to have someone take you home after the procedure.  You may have a blood sample taken. What  happens during the procedure?  A needle will be inserted into one of your veins.  Tubing and a collection bag will be attached to that needle.  Blood will flow through the needle and tubing into the collection bag.  You may be asked to open and close your hand slowly and continually during the entire collection.  After the specified amount of blood has been removed from your body, the collection bag and tubing will be clamped.  The needle will be removed from your vein.  Pressure will be held on the site of the needle insertion to stop the bleeding.  A bandage (dressing) will be placed over the needle insertion site. The procedure may vary among health care providers and hospitals. What happens after the procedure?  Your recovery will be assessed and monitored.  You can return to your normal activities as directed by your health care provider. This information is not intended to replace advice given to you by your health care provider. Make sure you discuss any questions you have with your health care provider. Document Released: 06/16/2010 Document Revised: 09/14/2015 Document Reviewed: 01/08/2014 Elsevier Interactive Patient Education  2018 Elsevier Inc.  

## 2017-12-21 LAB — FERRITIN: FERRITIN: 12 ng/mL — AB (ref 24–336)

## 2017-12-21 LAB — IRON AND TIBC
IRON: 59 ug/dL (ref 42–163)
Saturation Ratios: 14 % — ABNORMAL LOW (ref 20–55)
TIBC: 430 ug/dL — ABNORMAL HIGH (ref 202–409)
UIBC: 371 ug/dL (ref 117–376)

## 2018-02-15 ENCOUNTER — Telehealth: Payer: Self-pay | Admitting: Family

## 2018-02-15 NOTE — Telephone Encounter (Signed)
Returned call to patient and lmom to inform of lab/Royal/phlebotomy appt 1/28 at 11 am

## 2018-02-16 ENCOUNTER — Other Ambulatory Visit: Payer: Self-pay

## 2018-02-16 ENCOUNTER — Inpatient Hospital Stay: Payer: Self-pay | Attending: Family

## 2018-02-16 ENCOUNTER — Inpatient Hospital Stay (HOSPITAL_BASED_OUTPATIENT_CLINIC_OR_DEPARTMENT_OTHER): Payer: Self-pay | Admitting: Family

## 2018-02-16 ENCOUNTER — Encounter: Payer: Self-pay | Admitting: Family

## 2018-02-16 ENCOUNTER — Inpatient Hospital Stay: Payer: Self-pay

## 2018-02-16 VITALS — BP 128/70 | HR 66 | Temp 98.3°F | Resp 17

## 2018-02-16 VITALS — BP 140/68 | HR 69 | Temp 97.9°F | Resp 16 | Wt 198.0 lb

## 2018-02-16 DIAGNOSIS — D45 Polycythemia vera: Secondary | ICD-10-CM

## 2018-02-16 DIAGNOSIS — D751 Secondary polycythemia: Secondary | ICD-10-CM

## 2018-02-16 DIAGNOSIS — Z7902 Long term (current) use of antithrombotics/antiplatelets: Secondary | ICD-10-CM

## 2018-02-16 DIAGNOSIS — Z79899 Other long term (current) drug therapy: Secondary | ICD-10-CM | POA: Insufficient documentation

## 2018-02-16 DIAGNOSIS — D500 Iron deficiency anemia secondary to blood loss (chronic): Secondary | ICD-10-CM

## 2018-02-16 DIAGNOSIS — F17210 Nicotine dependence, cigarettes, uncomplicated: Secondary | ICD-10-CM

## 2018-02-16 LAB — LACTATE DEHYDROGENASE: LDH: 160 U/L (ref 98–192)

## 2018-02-16 LAB — CMP (CANCER CENTER ONLY)
ALK PHOS: 54 U/L (ref 38–126)
ALT: 17 U/L (ref 0–44)
AST: 18 U/L (ref 15–41)
Albumin: 4.6 g/dL (ref 3.5–5.0)
Anion gap: 6 (ref 5–15)
BILIRUBIN TOTAL: 0.6 mg/dL (ref 0.3–1.2)
BUN: 13 mg/dL (ref 6–20)
CALCIUM: 9.4 mg/dL (ref 8.9–10.3)
CO2: 30 mmol/L (ref 22–32)
CREATININE: 0.96 mg/dL (ref 0.61–1.24)
Chloride: 104 mmol/L (ref 98–111)
Glucose, Bld: 74 mg/dL (ref 70–99)
Potassium: 4.3 mmol/L (ref 3.5–5.1)
Sodium: 140 mmol/L (ref 135–145)
TOTAL PROTEIN: 7.1 g/dL (ref 6.5–8.1)

## 2018-02-16 LAB — CBC WITH DIFFERENTIAL (CANCER CENTER ONLY)
Abs Immature Granulocytes: 0.06 10*3/uL (ref 0.00–0.07)
BASOS ABS: 0.1 10*3/uL (ref 0.0–0.1)
BASOS PCT: 1 %
EOS ABS: 0.1 10*3/uL (ref 0.0–0.5)
EOS PCT: 1 %
HCT: 46.2 % (ref 39.0–52.0)
HEMOGLOBIN: 15.4 g/dL (ref 13.0–17.0)
Immature Granulocytes: 0 %
LYMPHS PCT: 16 %
Lymphs Abs: 2.2 10*3/uL (ref 0.7–4.0)
MCH: 29.2 pg (ref 26.0–34.0)
MCHC: 33.3 g/dL (ref 30.0–36.0)
MCV: 87.5 fL (ref 80.0–100.0)
Monocytes Absolute: 0.9 10*3/uL (ref 0.1–1.0)
Monocytes Relative: 7 %
NRBC: 0 % (ref 0.0–0.2)
Neutro Abs: 10.4 10*3/uL — ABNORMAL HIGH (ref 1.7–7.7)
Neutrophils Relative %: 75 %
PLATELETS: 263 10*3/uL (ref 150–400)
RBC: 5.28 MIL/uL (ref 4.22–5.81)
RDW: 14.6 % (ref 11.5–15.5)
WBC: 13.8 10*3/uL — AB (ref 4.0–10.5)

## 2018-02-16 NOTE — Progress Notes (Signed)
Hematology and Oncology Follow Up Visit  Stuart Hill [id number] 1970-03-13 48 y.o. 02/16/2018   Principle Diagnosis:  Secondary polycythemia JAK2 (-), heavy smoker  LEFT Bell's palsy  Current Therapy:   Phlebotomy to maintain hematocrit below 42% Plavix 75 mg po q day- stoppedhimself and does not want to restart   Interim History:  Stuart Hill is here today for follow-up. He called to come in earlier for his appointment. He is symptomatic with fatigue needing to sleep more, blurry vision, "brain fog" and a fall last week.  He states that when his Hct is up he develops these symptoms. Hct today is 46.2%.  He states that he has had some episodes of stuttering with his speech and will still have occasional spasms in the left jaw.  He is drinking a lot of coffee each day and still smoking.  No fever, chills, n/v, cough, rash, SOB, chest pain, palpitations, abdominal pain or changes in bowel or bladder habits.  No swelling, tenderness, numbness or tingling in his extremities.  No lymphadenopathy noted on exam.  He has maintained a good appetite and is staying well hydrate. His weight is stable.   ECOG Performance Status: 1 - Symptomatic but completely ambulatory  Medications:  Allergies as of 02/16/2018      Reactions   Asa [aspirin] Hives   Acetaminophen Hives, Other (See Comments)   Afrin [nasal Spray] Hives   Antihistamines, Chlorpheniramine-type Hives   Benadryl [diphenhydramine Hcl] Hives   Buprenorphine Hcl Hives, Other (See Comments)   Carisoprodol Hives, Other (See Comments)   Codeine Hives, Other (See Comments)   Diphenhydramine Hcl Other (See Comments), Hives   Ibuprofen Hives, Other (See Comments)   Ipratropium Hives, Other (See Comments)   Metoclopramide Hives, Other (See Comments)   Morphine And Related Hives   Other Hives   Oxymetazoline Hives, Other (See Comments)   Plavix [clopidogrel Bisulfate] Other (See Comments)   Confusion, lethargy, slurred speech     Pheniramine Hives, Rash, Other (See Comments)      Medication List       Accurate as of February 16, 2018 11:35 AM. Always use your most recent med list.        ALPRAZolam 1 MG tablet Commonly known as:  XANAX TAKE 1 TABLET BY MOUTH THREE TIMES A DAY AS NEEDED FOR ANXIETY   ergocalciferol 1.25 MG (50000 UT) capsule Commonly known as:  VITAMIN D2 Take 1 capsule (50,000 Units total) by mouth once a week.   esomeprazole 20 MG capsule Commonly known as:  NEXIUM Take 20 mg by mouth daily at 12 noon.   meloxicam 15 MG tablet Commonly known as:  MOBIC Take 1/2 or 1 daily -Use as needed for joint pain   sildenafil 20 MG tablet Commonly known as:  REVATIO Take 20- 60 mg po prior to sexual activity as needed   triamcinolone cream 0.5 % Commonly known as:  KENALOG APPLY TO AFFECTED AREA TWICE A DAY       Allergies:  Allergies  Allergen Reactions  . Asa [Aspirin] Hives  . Acetaminophen Hives and Other (See Comments)  . Afrin [Nasal Spray] Hives  . Antihistamines, Chlorpheniramine-Type Hives  . Benadryl [Diphenhydramine Hcl] Hives  . Buprenorphine Hcl Hives and Other (See Comments)  . Carisoprodol Hives and Other (See Comments)  . Codeine Hives and Other (See Comments)  . Diphenhydramine Hcl Other (See Comments) and Hives  . Ibuprofen Hives and Other (See Comments)  . Ipratropium Hives and Other (See Comments)  .  Metoclopramide Hives and Other (See Comments)  . Morphine And Related Hives  . Other Hives  . Oxymetazoline Hives and Other (See Comments)  . Plavix [Clopidogrel Bisulfate] Other (See Comments)    Confusion, lethargy, slurred speech  . Pheniramine Hives, Rash and Other (See Comments)    Past Medical History, Surgical history, Social history, and Family History were reviewed and updated.  Review of Systems: All other 10 point review of systems is negative.   Physical Exam:  vitals were not taken for this visit.   Wt Readings from Last 3 Encounters:   12/20/17 192 lb (87.1 kg)  11/15/17 190 lb 12 oz (86.5 kg)  11/08/17 191 lb (86.6 kg)    Ocular: Sclerae unicteric, pupils equal, round and reactive to light Ear-nose-throat: Oropharynx clear, dentition fair Lymphatic: No cervical, supraclavicular or axillary adenopathy Lungs no rales or rhonchi, good excursion bilaterally Heart regular rate and rhythm, no murmur appreciated Abd soft, nontender, positive bowel sounds, no liver or spleen tip palpated on exam, no fluid wave  MSK no focal spinal tenderness, no joint edema Neuro: non-focal, well-oriented, appropriate affect Breasts: Deferred   Lab Results  Component Value Date   WBC 13.8 (H) 02/16/2018   HGB 15.4 02/16/2018   HCT 46.2 02/16/2018   MCV 87.5 02/16/2018   PLT 263 02/16/2018   Lab Results  Component Value Date   FERRITIN 12 (L) 12/20/2017   IRON 59 12/20/2017   TIBC 430 (H) 12/20/2017   UIBC 371 12/20/2017   IRONPCTSAT 14 (L) 12/20/2017   Lab Results  Component Value Date   RETICCTPCT 1.0 12/20/2017   RBC 5.28 02/16/2018   No results found for: KPAFRELGTCHN, LAMBDASER, KAPLAMBRATIO No results found for: IGGSERUM, IGA, IGMSERUM No results found for: Kathrynn Ducking, MSPIKE, SPEI   Chemistry      Component Value Date/Time   NA 140 02/16/2018 1057   NA 143 01/01/2017 1453   NA 139 11/12/2015 0945   K 4.3 02/16/2018 1057   K 4.3 01/01/2017 1453   K 3.7 11/12/2015 0945   CL 104 02/16/2018 1057   CL 103 01/01/2017 1453   CO2 30 02/16/2018 1057   CO2 27 01/01/2017 1453   CO2 22 11/12/2015 0945   BUN 13 02/16/2018 1057   BUN 18 01/01/2017 1453   BUN 13.1 11/12/2015 0945   CREATININE 0.96 02/16/2018 1057   CREATININE 1.0 01/01/2017 1453   CREATININE 0.9 11/12/2015 0945      Component Value Date/Time   CALCIUM 9.4 02/16/2018 1057   CALCIUM 8.8 01/01/2017 1453   CALCIUM 8.9 11/12/2015 0945   ALKPHOS 54 02/16/2018 1057   ALKPHOS 44 01/01/2017 1453   ALKPHOS 66  11/12/2015 0945   AST 18 02/16/2018 1057   AST 13 11/12/2015 0945   ALT 17 02/16/2018 1057   ALT 368 (HH) 01/01/2017 1453   ALT 14 11/12/2015 0945   BILITOT 0.6 02/16/2018 1057   BILITOT <0.22 11/12/2015 0945       Impression and Plan: Stuart Hill is a pleasant 48 yo caucasian gentleman with 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  He is symptomatic as mentioned above with Hct 46.2%.  We will proceed with phlebotomy today.  We will see him for lab only and phlebotomy only if needed in 1 month and then follow-up in 2 months.  He will contact our office with any questions or concerns. We can certainly see her sooner if need be.   [name], NP 1/22/202011:35  AM

## 2018-02-17 LAB — IRON AND TIBC
IRON: 72 ug/dL (ref 42–163)
Saturation Ratios: 17 % — ABNORMAL LOW (ref 20–55)
TIBC: 433 ug/dL — AB (ref 202–409)
UIBC: 361 ug/dL (ref 117–376)

## 2018-02-17 LAB — FERRITIN: Ferritin: 7 ng/mL — ABNORMAL LOW (ref 24–336)

## 2018-02-21 ENCOUNTER — Other Ambulatory Visit: Payer: Self-pay | Admitting: Family Medicine

## 2018-02-21 DIAGNOSIS — F411 Generalized anxiety disorder: Secondary | ICD-10-CM

## 2018-02-22 ENCOUNTER — Other Ambulatory Visit: Payer: Self-pay

## 2018-02-22 ENCOUNTER — Ambulatory Visit: Payer: Self-pay | Admitting: Family

## 2018-02-23 NOTE — Telephone Encounter (Signed)
Patient last seen October [facility]:  01/27/2018  2   11/29/2017  Alprazolam 1 MG Tablet  90.00 30 Je Cop   [id number]   [place] (1204)   2  6.00 LME  Private Pay   [place]  12/28/2017  2   11/29/2017  Alprazolam 1 MG Tablet  90.00 30 Je Cop   [id number]   [place] (1204)   1  6.00 LME  Private Pay   [place]  11/29/2017  2   11/29/2017  Alprazolam 1 MG Tablet  90.00 30 Je Cop   [id number]   [place] (1204)   0  6.00 LME  Private Pay   [place]  11/02/2017  2   09/03/2017  Alprazolam 1 MG Tablet  90.00 30 Je Cop   [id number]   [place] (1204)   2  6.00 LME  Private Pay   [place]  10/04/2017  2   09/03/2017  Alprazolam 1 MG Tablet  90.00 30 Je Cop   [id number]   [place] (1204)   1  6.00 LME  Private Pay   [place]  09/03/2017  2   09/03/2017  Alprazolam 1 MG Tablet  90.00 30 Je Cop   [id number]   [place] (1204)   0  6.00 LME  Private Pay   [place]  08/03/2017  2   06/01/2017  Alprazolam 1 MG Tablet  90.00 30 Je Cop   [id number]   [place] (1204)   2  6.00 LME  Private Pay   [place]  07/04/2017  2   06/01/2017  Alprazolam 1 MG Tablet  90.00 30 Je Cop   [id number]   [place] (1204)   1  6.00 LME  Private Pay   [place]  06/01/2017  2   06/01/2017  Alprazolam 1 MG Tablet  90.00 30 Je Cop   [id number]   [place] (1204)   0  6.00 LME  Private Pay   [place]  05/03/2017  2   02/18/2017  Alprazolam 1 MG Tablet  90.00 30 Je Cop   [id number]   [place] (1204)   2  6.00 LME  Private Pay     04/30/2017  2   04/30/2017  Hydrocodone-Acetamn 7.5-325/15  473.00 8 Da Sho   [id number]   [place] (2805)   0  29.56 MME  Private Pay

## 2018-03-16 ENCOUNTER — Inpatient Hospital Stay: Payer: Self-pay

## 2018-03-16 ENCOUNTER — Inpatient Hospital Stay: Payer: Self-pay | Attending: Family

## 2018-03-16 VITALS — BP 133/75 | HR 65 | Temp 97.5°F | Resp 16

## 2018-03-16 DIAGNOSIS — D751 Secondary polycythemia: Secondary | ICD-10-CM | POA: Insufficient documentation

## 2018-03-16 DIAGNOSIS — D500 Iron deficiency anemia secondary to blood loss (chronic): Secondary | ICD-10-CM

## 2018-03-16 LAB — CBC WITH DIFFERENTIAL (CANCER CENTER ONLY)
Abs Immature Granulocytes: 0.05 10*3/uL (ref 0.00–0.07)
Basophils Absolute: 0.1 10*3/uL (ref 0.0–0.1)
Basophils Relative: 1 %
Eosinophils Absolute: 0.1 10*3/uL (ref 0.0–0.5)
Eosinophils Relative: 1 %
HCT: 45.4 % (ref 39.0–52.0)
Hemoglobin: 15.3 g/dL (ref 13.0–17.0)
Immature Granulocytes: 0 %
Lymphocytes Relative: 16 %
Lymphs Abs: 2.2 10*3/uL (ref 0.7–4.0)
MCH: 29.5 pg (ref 26.0–34.0)
MCHC: 33.7 g/dL (ref 30.0–36.0)
MCV: 87.6 fL (ref 80.0–100.0)
Monocytes Absolute: 1.1 10*3/uL — ABNORMAL HIGH (ref 0.1–1.0)
Monocytes Relative: 8 %
Neutro Abs: 9.7 10*3/uL — ABNORMAL HIGH (ref 1.7–7.7)
Neutrophils Relative %: 74 %
Platelet Count: 261 10*3/uL (ref 150–400)
RBC: 5.18 MIL/uL (ref 4.22–5.81)
RDW: 14 % (ref 11.5–15.5)
WBC Count: 13.1 10*3/uL — ABNORMAL HIGH (ref 4.0–10.5)
nRBC: 0 % (ref 0.0–0.2)

## 2018-03-16 LAB — CMP (CANCER CENTER ONLY)
ALT: 21 U/L (ref 0–44)
AST: 20 U/L (ref 15–41)
Albumin: 4.4 g/dL (ref 3.5–5.0)
Alkaline Phosphatase: 54 U/L (ref 38–126)
Anion gap: 9 (ref 5–15)
BUN: 12 mg/dL (ref 6–20)
CHLORIDE: 102 mmol/L (ref 98–111)
CO2: 28 mmol/L (ref 22–32)
Calcium: 9.3 mg/dL (ref 8.9–10.3)
Creatinine: 0.93 mg/dL (ref 0.61–1.24)
GFR, Est AFR Am: >60 mL/min (ref >60)
GFR, Estimated: >60 mL/min (ref >60)
Glucose, Bld: 100 mg/dL — ABNORMAL HIGH (ref 70–99)
Potassium: 4.4 mmol/L (ref 3.5–5.1)
Sodium: 139 mmol/L (ref 135–145)
Total Bilirubin: 0.4 mg/dL (ref 0.3–1.2)
Total Protein: 7.3 g/dL (ref 6.5–8.1)

## 2018-03-16 NOTE — Patient Instructions (Signed)

## 2018-03-16 NOTE — Progress Notes (Signed)
Stuart Hill presents today for phlebotomy per MD orders. Phlebotomy procedure started at 1101 and ended at 1111 via phlebotomy kit to right ac.  525 grams removed.  Pressure dressing applied.  Patient refused to stay for full 30 minutes after phlebotomy and refused snack and drink.  Patient tolerated procedure well.  Patient without complaints at time of discharge. Pressure dressing clean, dry and intact to right ac at time of discharge.  IV needle removed intact.

## 2018-03-17 LAB — IRON AND TIBC
Iron: 30 ug/dL — ABNORMAL LOW (ref 42–163)
Saturation Ratios: 7 % — ABNORMAL LOW (ref 20–55)
TIBC: 444 ug/dL — ABNORMAL HIGH (ref 202–409)
UIBC: 414 ug/dL — ABNORMAL HIGH (ref 117–376)

## 2018-03-17 LAB — LACTATE DEHYDROGENASE: LDH: 173 U/L (ref 98–192)

## 2018-03-17 LAB — FERRITIN: Ferritin: 7 ng/mL — ABNORMAL LOW (ref 24–336)

## 2018-03-21 ENCOUNTER — Other Ambulatory Visit: Payer: Self-pay | Admitting: Family

## 2018-03-21 DIAGNOSIS — E559 Vitamin D deficiency, unspecified: Secondary | ICD-10-CM

## 2018-04-06 ENCOUNTER — Inpatient Hospital Stay (HOSPITAL_BASED_OUTPATIENT_CLINIC_OR_DEPARTMENT_OTHER): Payer: Self-pay | Admitting: Hematology & Oncology

## 2018-04-06 ENCOUNTER — Encounter: Payer: Self-pay | Admitting: Hematology & Oncology

## 2018-04-06 ENCOUNTER — Inpatient Hospital Stay: Payer: Self-pay | Attending: Family

## 2018-04-06 ENCOUNTER — Other Ambulatory Visit: Payer: Self-pay

## 2018-04-06 ENCOUNTER — Inpatient Hospital Stay: Payer: Self-pay

## 2018-04-06 VITALS — BP 129/77 | HR 71 | Temp 97.9°F | Resp 20 | Ht 75.0 in | Wt 198.8 lb

## 2018-04-06 DIAGNOSIS — G510 Bell's palsy: Secondary | ICD-10-CM | POA: Insufficient documentation

## 2018-04-06 DIAGNOSIS — D500 Iron deficiency anemia secondary to blood loss (chronic): Secondary | ICD-10-CM

## 2018-04-06 DIAGNOSIS — D45 Polycythemia vera: Secondary | ICD-10-CM

## 2018-04-06 DIAGNOSIS — D751 Secondary polycythemia: Secondary | ICD-10-CM

## 2018-04-06 LAB — CBC WITH DIFFERENTIAL (CANCER CENTER ONLY)
Abs Immature Granulocytes: 0.02 10*3/uL (ref 0.00–0.07)
Basophils Absolute: 0.1 10*3/uL (ref 0.0–0.1)
Basophils Relative: 1 %
EOS ABS: 0.1 10*3/uL (ref 0.0–0.5)
EOS PCT: 1 %
HCT: 39.6 % (ref 39.0–52.0)
Hemoglobin: 13.2 g/dL (ref 13.0–17.0)
Immature Granulocytes: 0 %
Lymphocytes Relative: 23 %
Lymphs Abs: 2.4 10*3/uL (ref 0.7–4.0)
MCH: 29.3 pg (ref 26.0–34.0)
MCHC: 33.3 g/dL (ref 30.0–36.0)
MCV: 87.8 fL (ref 80.0–100.0)
MONO ABS: 0.8 10*3/uL (ref 0.1–1.0)
Monocytes Relative: 8 %
Neutro Abs: 7.1 10*3/uL (ref 1.7–7.7)
Neutrophils Relative %: 67 %
Platelet Count: 269 10*3/uL (ref 150–400)
RBC: 4.51 MIL/uL (ref 4.22–5.81)
RDW: 14.1 % (ref 11.5–15.5)
WBC Count: 10.4 10*3/uL (ref 4.0–10.5)
nRBC: 0 % (ref 0.0–0.2)

## 2018-04-06 LAB — CMP (CANCER CENTER ONLY)
ALT: 17 U/L (ref 0–44)
ANION GAP: 8 (ref 5–15)
AST: 19 U/L (ref 15–41)
Albumin: 4.5 g/dL (ref 3.5–5.0)
Alkaline Phosphatase: 50 U/L (ref 38–126)
BUN: 16 mg/dL (ref 6–20)
CO2: 25 mmol/L (ref 22–32)
Calcium: 8.6 mg/dL — ABNORMAL LOW (ref 8.9–10.3)
Chloride: 104 mmol/L (ref 98–111)
Creatinine: 1.23 mg/dL (ref 0.61–1.24)
GFR, Est AFR Am: >60 mL/min (ref >60)
GFR, Estimated: >60 mL/min (ref >60)
Glucose, Bld: 104 mg/dL — ABNORMAL HIGH (ref 70–99)
Potassium: 4 mmol/L (ref 3.5–5.1)
Sodium: 137 mmol/L (ref 135–145)
TOTAL PROTEIN: 7.1 g/dL (ref 6.5–8.1)
Total Bilirubin: 0.5 mg/dL (ref 0.3–1.2)

## 2018-04-06 LAB — LACTATE DEHYDROGENASE: LDH: 186 U/L (ref 98–192)

## 2018-04-06 NOTE — Progress Notes (Signed)
Hematology and Oncology Follow Up Visit  Stuart Hill [id number] 1970/06/25 48 y.o. 04/06/2018   Principle Diagnosis:  Secondary polycythemia JAK2 (-), smoker  LEFT Bell's palsy  Current Therapy:   Phlebotomy to maintain hematocrit below 42% Plavix 75 mg po q day- stoppedhimself and does not want to restart   Interim History:  Stuart Hill is here today for follow-up.  He is very busy with work.  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  The weather has really not been all that bad this winter so he has been able to have a lot of jobs being done.  He is worried about his wife.  His wife had hip surgery in February.  She had avascular necrosis.  She is had history of inflammatory breast cancer.  He is just worried about her cancer coming back.  He does feel little bit "foggy."  He thinks that his blood is too high.  In actuality, his hematocrit is only 39.6.  He does not need to be phlebotomized.  He is still smoking.  He probably smokes quite a bit.  There is been no nausea or vomiting.  He has had no rashes.  He has had no change in bowel or bladder habits.  He is not working out like he would like to.  He is gained a little bit of weight.  He is being pretty liberal with what he eats.  His iron studies clearly on the low side.  I think his ferritin is only 7 with an iron saturation of 7%.  Overall, his performance status is ECOG 1.    Medications:  Allergies as of 04/06/2018      Reactions   Asa [aspirin] Hives   Acetaminophen Hives, Other (See Comments)   Afrin [nasal Spray] Hives   Antihistamines, Chlorpheniramine-type Hives   Benadryl [diphenhydramine Hcl] Hives   Buprenorphine Hcl Hives, Other (See Comments)   Carisoprodol Hives, Other (See Comments)   Codeine Hives, Other (See Comments)   Diphenhydramine Hcl Other (See Comments), Hives   Ibuprofen Hives, Other (See Comments)   Ipratropium Hives, Other (See Comments)   Metoclopramide Hives, Other (See  Comments)   Morphine And Related Hives   Other Hives   Oxymetazoline Hives, Other (See Comments)   Plavix [clopidogrel Bisulfate] Other (See Comments)   Confusion, lethargy, slurred speech   Pheniramine Hives, Rash, Other (See Comments)      Medication List       Accurate as of April 06, 2018  2:44 PM. Always use your most recent med list.        ALPRAZolam 1 MG tablet Commonly known as:  XANAX TAKE 1 TABLET BY MOUTH THREE TIMES A DAY AS NEEDED FOR ANXIETY   esomeprazole 20 MG capsule Commonly known as:  NEXIUM Take 20 mg by mouth daily at 12 noon.   meloxicam 15 MG tablet Commonly known as:  MOBIC Take 1/2 or 1 daily -Use as needed for joint pain   sildenafil 20 MG tablet Commonly known as:  REVATIO Take 20- 60 mg po prior to sexual activity as needed   triamcinolone cream 0.5 % Commonly known as:  KENALOG APPLY TO AFFECTED AREA TWICE A DAY   Vitamin D (Ergocalciferol) 1.25 MG (50000 UT) Caps capsule Commonly known as:  DRISDOL TAKE ONE CAPSULE BY MOUTH ONE TIME PER WEEK       Allergies:  Allergies  Allergen Reactions  . Asa [Aspirin] Hives  . Acetaminophen Hives and Other (  See Comments)  . Afrin [Nasal Spray] Hives  . Antihistamines, Chlorpheniramine-Type Hives  . Benadryl [Diphenhydramine Hcl] Hives  . Buprenorphine Hcl Hives and Other (See Comments)  . Carisoprodol Hives and Other (See Comments)  . Codeine Hives and Other (See Comments)  . Diphenhydramine Hcl Other (See Comments) and Hives  . Ibuprofen Hives and Other (See Comments)  . Ipratropium Hives and Other (See Comments)  . Metoclopramide Hives and Other (See Comments)  . Morphine And Related Hives  . Other Hives  . Oxymetazoline Hives and Other (See Comments)  . Plavix [Clopidogrel Bisulfate] Other (See Comments)    Confusion, lethargy, slurred speech  . Pheniramine Hives, Rash and Other (See Comments)    Past Medical History, Surgical history, Social history, and Family History were  reviewed and updated.  Review of Systems: Review of Systems  Constitutional: Negative.   HENT: Negative.   Eyes: Negative.   Respiratory: Negative.   Cardiovascular: Negative.   Gastrointestinal: Negative.   Genitourinary: Negative.   Musculoskeletal: Negative.   Skin: Negative.   Neurological: Negative.   Endo/Heme/Allergies: Negative.   Psychiatric/Behavioral: Negative.       Physical Exam:  height is 6\' 3"  (1.905 m) and weight is 198 lb 12.8 oz (90.2 kg). His oral temperature is 97.9 F (36.6 C). His blood pressure is 129/77 and his pulse is 71. His respiration is 20 and oxygen saturation is 99%.   Wt Readings from Last 3 Encounters:  04/06/18 198 lb 12.8 oz (90.2 kg)  02/16/18 198 lb (89.8 kg)  12/20/17 192 lb (87.1 kg)    Physical Exam Vitals signs reviewed.  HENT:     Head: Normocephalic and atraumatic.  Eyes:     Pupils: Pupils are equal, round, and reactive to light.  Neck:     Musculoskeletal: Normal range of motion.  Cardiovascular:     Rate and Rhythm: Normal rate and regular rhythm.     Heart sounds: Normal heart sounds.  Pulmonary:     Effort: Pulmonary effort is normal.     Breath sounds: Normal breath sounds.  Abdominal:     General: Bowel sounds are normal.     Palpations: Abdomen is soft.  Musculoskeletal: Normal range of motion.        General: No tenderness or deformity.  Lymphadenopathy:     Cervical: No cervical adenopathy.  Skin:    General: Skin is warm and dry.     Findings: No erythema or rash.  Neurological:     Mental Status: He is alert and oriented to person, place, and time.  Psychiatric:        Behavior: Behavior normal.        Thought Content: Thought content normal.        Judgment: Judgment normal.      Lab Results  Component Value Date   WBC 10.4 04/06/2018   HGB 13.2 04/06/2018   HCT 39.6 04/06/2018   MCV 87.8 04/06/2018   PLT 269 04/06/2018   Lab Results  Component Value Date   FERRITIN 7 (L) 03/16/2018    IRON 30 (L) 03/16/2018   TIBC 444 (H) 03/16/2018   UIBC 414 (H) 03/16/2018   IRONPCTSAT 7 (L) 03/16/2018   Lab Results  Component Value Date   RETICCTPCT 1.0 12/20/2017   RBC 4.51 04/06/2018   No results found for: KPAFRELGTCHN, LAMBDASER, KAPLAMBRATIO No results found for: IGGSERUM, IGA, IGMSERUM No results found for: TOTALPROTELP, ALBUMINELP, A1GS, A2GS, BETS, BETA2SER, [place], MSPIKE, SPEI  Chemistry      Component Value Date/Time   NA 137 04/06/2018 1345   NA 143 01/01/2017 1453   NA 139 11/12/2015 0945   K 4.0 04/06/2018 1345   K 4.3 01/01/2017 1453   K 3.7 11/12/2015 0945   CL 104 04/06/2018 1345   CL 103 01/01/2017 1453   CO2 25 04/06/2018 1345   CO2 27 01/01/2017 1453   CO2 22 11/12/2015 0945   BUN 16 04/06/2018 1345   BUN 18 01/01/2017 1453   BUN 13.1 11/12/2015 0945   CREATININE 1.23 04/06/2018 1345   CREATININE 1.0 01/01/2017 1453   CREATININE 0.9 11/12/2015 0945      Component Value Date/Time   CALCIUM 8.6 (L) 04/06/2018 1345   CALCIUM 8.8 01/01/2017 1453   CALCIUM 8.9 11/12/2015 0945   ALKPHOS 50 04/06/2018 1345   ALKPHOS 44 01/01/2017 1453   ALKPHOS 66 11/12/2015 0945   AST 19 04/06/2018 1345   AST 13 11/12/2015 0945   ALT 17 04/06/2018 1345   ALT 368 (HH) 01/01/2017 1453   ALT 14 11/12/2015 0945   BILITOT 0.5 04/06/2018 1345   BILITOT <0.22 11/12/2015 0945      Impression and Plan: Mr. Dishman is a very pleasant 48 yo white male with history of polycythemia vera.  I realize that he is Jak 2-.  However, he has a very low erythropoietin level.  For right now, he does not need to be phlebotomized.  We will have him come back in 6 weeks.  I think that as long as he has low iron levels, his chance of being phlebotomized goes down.  I think his biggest risk right now is smoking.Marland Kitchen     Stuart Napoleon, MD 3/11/20202:44 PM

## 2018-04-07 LAB — IRON AND TIBC
Iron: 76 ug/dL (ref 42–163)
Saturation Ratios: 18 % — ABNORMAL LOW (ref 20–55)
TIBC: 431 ug/dL — ABNORMAL HIGH (ref 202–409)
UIBC: 355 ug/dL (ref 117–376)

## 2018-04-07 LAB — FERRITIN: Ferritin: 5 ng/mL — ABNORMAL LOW (ref 24–336)

## 2018-04-20 ENCOUNTER — Ambulatory Visit: Payer: Self-pay | Admitting: Hematology & Oncology

## 2018-04-20 ENCOUNTER — Other Ambulatory Visit: Payer: Self-pay

## 2018-05-12 ENCOUNTER — Inpatient Hospital Stay (HOSPITAL_BASED_OUTPATIENT_CLINIC_OR_DEPARTMENT_OTHER): Payer: Self-pay | Admitting: Family

## 2018-05-12 ENCOUNTER — Other Ambulatory Visit: Payer: Self-pay

## 2018-05-12 ENCOUNTER — Inpatient Hospital Stay: Payer: Self-pay | Attending: Family

## 2018-05-12 ENCOUNTER — Encounter: Payer: Self-pay | Admitting: Family

## 2018-05-12 ENCOUNTER — Inpatient Hospital Stay: Payer: Self-pay

## 2018-05-12 VITALS — BP 134/82 | HR 56 | Resp 18

## 2018-05-12 VITALS — BP 135/67 | HR 53

## 2018-05-12 DIAGNOSIS — Z79899 Other long term (current) drug therapy: Secondary | ICD-10-CM | POA: Insufficient documentation

## 2018-05-12 DIAGNOSIS — D751 Secondary polycythemia: Secondary | ICD-10-CM

## 2018-05-12 DIAGNOSIS — G510 Bell's palsy: Secondary | ICD-10-CM | POA: Insufficient documentation

## 2018-05-12 DIAGNOSIS — D500 Iron deficiency anemia secondary to blood loss (chronic): Secondary | ICD-10-CM

## 2018-05-12 DIAGNOSIS — F17210 Nicotine dependence, cigarettes, uncomplicated: Secondary | ICD-10-CM

## 2018-05-12 DIAGNOSIS — D45 Polycythemia vera: Secondary | ICD-10-CM

## 2018-05-12 LAB — CMP (CANCER CENTER ONLY)
ALT: 12 U/L (ref 0–44)
AST: 16 U/L (ref 15–41)
Albumin: 4.3 g/dL (ref 3.5–5.0)
Alkaline Phosphatase: 47 U/L (ref 38–126)
Anion gap: 5 (ref 5–15)
BUN: 14 mg/dL (ref 6–20)
CO2: 27 mmol/L (ref 22–32)
Calcium: 9.1 mg/dL (ref 8.9–10.3)
Chloride: 101 mmol/L (ref 98–111)
Creatinine: 0.95 mg/dL (ref 0.61–1.24)
GFR, Est AFR Am: >60 mL/min (ref >60)
GFR, Estimated: >60 mL/min (ref >60)
Glucose, Bld: 101 mg/dL — ABNORMAL HIGH (ref 70–99)
Potassium: 3.8 mmol/L (ref 3.5–5.1)
Sodium: 133 mmol/L — ABNORMAL LOW (ref 135–145)
Total Bilirubin: 0.5 mg/dL (ref 0.3–1.2)
Total Protein: 6.9 g/dL (ref 6.5–8.1)

## 2018-05-12 LAB — CBC WITH DIFFERENTIAL (CANCER CENTER ONLY)
Abs Immature Granulocytes: 0.03 10*3/uL (ref 0.00–0.07)
Basophils Absolute: 0.1 10*3/uL (ref 0.0–0.1)
Basophils Relative: 1 %
Eosinophils Absolute: 0.1 10*3/uL (ref 0.0–0.5)
Eosinophils Relative: 1 %
HCT: 43.8 % (ref 39.0–52.0)
Hemoglobin: 14.5 g/dL (ref 13.0–17.0)
Immature Granulocytes: 0 %
Lymphocytes Relative: 17 %
Lymphs Abs: 2 10*3/uL (ref 0.7–4.0)
MCH: 29.4 pg (ref 26.0–34.0)
MCHC: 33.1 g/dL (ref 30.0–36.0)
MCV: 88.7 fL (ref 80.0–100.0)
Monocytes Absolute: 0.8 10*3/uL (ref 0.1–1.0)
Monocytes Relative: 7 %
Neutro Abs: 8.6 10*3/uL — ABNORMAL HIGH (ref 1.7–7.7)
Neutrophils Relative %: 74 %
Platelet Count: 259 10*3/uL (ref 150–400)
RBC: 4.94 MIL/uL (ref 4.22–5.81)
RDW: 14.3 % (ref 11.5–15.5)
WBC Count: 11.7 10*3/uL — ABNORMAL HIGH (ref 4.0–10.5)
nRBC: 0 % (ref 0.0–0.2)

## 2018-05-12 LAB — LACTATE DEHYDROGENASE: LDH: 170 U/L (ref 98–192)

## 2018-05-12 NOTE — Progress Notes (Signed)
Hematology and Oncology Follow Up Visit  Stuart Hill [id number] 1970/11/27 48 y.o. 05/12/2018   Principle Diagnosis:  Secondary polycythemia JAK2 (-), smoker  LEFT Bell's palsy  Current Therapy:   Phlebotomy to maintain hematocrit below 42% Plavix 75 mg po q day- stoppedhimself and does not want to restart   Interim History:  Stuart Hill is here today for follow-up. He is symptomatic with fatigue, brain fog and blurry vision at times.  Hct today is 43.8%.  He is still smoking.  He still has generalized joint aches and pains.  No fever, chills, n/v, cough, rash, dizziness, SOB, chest pain, palpitations, abdominal pain or changes in bowel or bladder habits.  No swelling, numbness or tingling in his extremities.  No lymphadenopathy noted on exam.  He has maintained a good appetite and is staying well hydrated. His weight is stable.   ECOG Performance Status: 1 - Symptomatic but completely ambulatory  Medications:  Allergies as of 05/12/2018      Reactions   Asa [aspirin] Hives   Acetaminophen Hives, Other (See Comments)   Afrin [nasal Spray] Hives   Antihistamines, Chlorpheniramine-type Hives   Benadryl [diphenhydramine Hcl] Hives   Buprenorphine Hcl Hives, Other (See Comments)   Carisoprodol Hives, Other (See Comments)   Codeine Hives, Other (See Comments)   Diphenhydramine Hcl Other (See Comments), Hives   Ibuprofen Hives, Other (See Comments)   Ipratropium Hives, Other (See Comments)   Metoclopramide Hives, Other (See Comments)   Morphine And Related Hives   Other Hives   Oxymetazoline Hives, Other (See Comments)   Plavix [clopidogrel Bisulfate] Other (See Comments)   Confusion, lethargy, slurred speech   Pheniramine Hives, Rash, Other (See Comments)      Medication List       Accurate as of May 12, 2018  1:28 PM. Always use your most recent med list.        ALPRAZolam 1 MG tablet Commonly known as:  XANAX TAKE 1 TABLET BY MOUTH THREE TIMES A DAY AS  NEEDED FOR ANXIETY   esomeprazole 20 MG capsule Commonly known as:  NEXIUM Take 20 mg by mouth daily at 12 noon.   meloxicam 15 MG tablet Commonly known as:  MOBIC Take 1/2 or 1 daily -Use as needed for joint pain   sildenafil 20 MG tablet Commonly known as:  REVATIO Take 20- 60 mg po prior to sexual activity as needed   triamcinolone cream 0.5 % Commonly known as:  KENALOG APPLY TO AFFECTED AREA TWICE A DAY   Vitamin D (Ergocalciferol) 1.25 MG (50000 UT) Caps capsule Commonly known as:  DRISDOL TAKE ONE CAPSULE BY MOUTH ONE TIME PER WEEK       Allergies:  Allergies  Allergen Reactions  . Asa [Aspirin] Hives  . Acetaminophen Hives and Other (See Comments)  . Afrin [Nasal Spray] Hives  . Antihistamines, Chlorpheniramine-Type Hives  . Benadryl [Diphenhydramine Hcl] Hives  . Buprenorphine Hcl Hives and Other (See Comments)  . Carisoprodol Hives and Other (See Comments)  . Codeine Hives and Other (See Comments)  . Diphenhydramine Hcl Other (See Comments) and Hives  . Ibuprofen Hives and Other (See Comments)  . Ipratropium Hives and Other (See Comments)  . Metoclopramide Hives and Other (See Comments)  . Morphine And Related Hives  . Other Hives  . Oxymetazoline Hives and Other (See Comments)  . Plavix [Clopidogrel Bisulfate] Other (See Comments)    Confusion, lethargy, slurred speech  . Pheniramine Hives, Rash and Other (See Comments)  Past Medical History, Surgical history, Social history, and Family History were reviewed and updated.  Review of Systems: All other 10 point review of systems is negative.   Physical Exam:  vitals were not taken for this visit.   Wt Readings from Last 3 Encounters:  04/06/18 198 lb 12.8 oz (90.2 kg)  02/16/18 198 lb (89.8 kg)  12/20/17 192 lb (87.1 kg)    Ocular: Sclerae unicteric, pupils equal, round and reactive to light Ear-nose-throat: Oropharynx clear, dentition fair Lymphatic: No cervical or supraclavicular adenopathy  Lungs no rales or rhonchi, good excursion bilaterally Heart regular rate and rhythm, no murmur appreciated Abd soft, nontender, positive bowel sounds, no liver or spleen tip palpated on exam, no fluid wave  MSK no focal spinal tenderness, no joint edema Neuro: non-focal, well-oriented, appropriate affect Breasts: Deferred   Lab Results  Component Value Date   WBC 11.7 (H) 05/12/2018   HGB 14.5 05/12/2018   HCT 43.8 05/12/2018   MCV 88.7 05/12/2018   PLT 259 05/12/2018   Lab Results  Component Value Date   FERRITIN 5 (L) 04/06/2018   IRON 76 04/06/2018   TIBC 431 (H) 04/06/2018   UIBC 355 04/06/2018   IRONPCTSAT 18 (L) 04/06/2018   Lab Results  Component Value Date   RETICCTPCT 1.0 12/20/2017   RBC 4.94 05/12/2018   No results found for: KPAFRELGTCHN, LAMBDASER, KAPLAMBRATIO No results found for: IGGSERUM, IGA, IGMSERUM No results found for: Odetta Pink, SPEI   Chemistry      Component Value Date/Time   NA 137 04/06/2018 1345   NA 143 01/01/2017 1453   NA 139 11/12/2015 0945   K 4.0 04/06/2018 1345   K 4.3 01/01/2017 1453   K 3.7 11/12/2015 0945   CL 104 04/06/2018 1345   CL 103 01/01/2017 1453   CO2 25 04/06/2018 1345   CO2 27 01/01/2017 1453   CO2 22 11/12/2015 0945   BUN 16 04/06/2018 1345   BUN 18 01/01/2017 1453   BUN 13.1 11/12/2015 0945   CREATININE 1.23 04/06/2018 1345   CREATININE 1.0 01/01/2017 1453   CREATININE 0.9 11/12/2015 0945      Component Value Date/Time   CALCIUM 8.6 (L) 04/06/2018 1345   CALCIUM 8.8 01/01/2017 1453   CALCIUM 8.9 11/12/2015 0945   ALKPHOS 50 04/06/2018 1345   ALKPHOS 44 01/01/2017 1453   ALKPHOS 66 11/12/2015 0945   AST 19 04/06/2018 1345   AST 13 11/12/2015 0945   ALT 17 04/06/2018 1345   ALT 368 (HH) 01/01/2017 1453   ALT 14 11/12/2015 0945   BILITOT 0.5 04/06/2018 1345   BILITOT <0.22 11/12/2015 0945       Impression and Plan: Stuart Hill is a pleasant  48 yo caucasian male with 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  His Hct is 43.8% and we will proceed with a mini phlebotomy today.  We will see him back in another 8 weeks for follow-up.  He will contact our office with any questions or concerns. We can certainly see him sooner if need be.   [name], NP 4/16/20201:28 PM

## 2018-05-12 NOTE — Patient Instructions (Signed)
Therapeutic Phlebotomy Therapeutic phlebotomy is the planned removal of blood from a person's body for the purpose of treating a medical condition. The procedure is similar to donating blood. Usually, about a pint (470 mL, or 0.47 L) of blood is removed. The average adult has 9-12 pints (4.3-5.7 L) of blood in the body. Therapeutic phlebotomy may be used to treat the following medical conditions:  Hemochromatosis. This is a condition in which the blood contains too much iron.  Polycythemia vera. This is a condition in which the blood contains too many red blood cells.  Porphyria cutanea tarda. This is a disease in which an important part of hemoglobin is not made properly. It results in the buildup of abnormal amounts of porphyrins in the body.  Sickle cell disease. This is a condition in which the red blood cells form an abnormal crescent shape rather than a round shape. Tell a health care provider about:  Any allergies you have.  All medicines you are taking, including vitamins, herbs, eye drops, creams, and over-the-counter medicines.  Any problems you or family members have had with anesthetic medicines.  Any blood disorders you have.  Any surgeries you have had.  Any medical conditions you have.  Whether you are pregnant or may be pregnant. What are the risks? Generally, this is a safe procedure. However, problems may occur, including:  Nausea or light-headedness.  Low blood pressure (hypotension).  Soreness, bleeding, swelling, or bruising at the needle insertion site.  Infection. What happens before the procedure?  Follow instructions from your health care provider about eating or drinking restrictions.  Ask your health care provider about: ? Changing or stopping your regular medicines. This is especially important if you are taking diabetes medicines or blood thinners (anticoagulants). ? Taking medicines such as aspirin and ibuprofen. These medicines can thin your  blood. Do not take these medicines unless your health care provider tells you to take them. ? Taking over-the-counter medicines, vitamins, herbs, and supplements.  Wear clothing with sleeves that can be raised above the elbow.  Plan to have someone take you home from the hospital or clinic.  You may have a blood sample taken.  Your blood pressure, pulse rate, and breathing rate will be measured. What happens during the procedure?   To lower your risk of infection: ? Your health care team will wash or sanitize their hands. ? Your skin will be cleaned with an antiseptic.  You may be given a medicine to numb the area (local anesthetic).  A tourniquet will be placed on your arm.  A needle will be inserted into one of your veins.  Tubing and a collection bag will be attached to that needle.  Blood will flow through the needle and tubing into the collection bag.  The collection bag will be placed lower than your arm to allow gravity to help the flow of blood into the bag.  You may be asked to open and close your hand slowly and continually during the entire collection.  After the specified amount of blood has been removed from your body, the collection bag and tubing will be clamped.  The needle will be removed from your vein.  Pressure will be held on the site of the needle insertion to stop the bleeding.  A bandage (dressing) will be placed over the needle insertion site. The procedure may vary among health care providers and hospitals. What happens after the procedure?  Your blood pressure, pulse rate, and breathing rate will be   measured after the procedure.  You will be encouraged to drink fluids.  Your recovery will be assessed and monitored.  You can return to your normal activities as told by your health care provider. Summary  Therapeutic phlebotomy is the planned removal of blood from a person's body for the purpose of treating a medical condition.  Therapeutic  phlebotomy may be used to treat hemochromatosis, polycythemia vera, porphyria cutanea tarda, or sickle cell disease.  In the procedure, a needle is inserted and about a pint (470 mL, or 0.47 L) of blood is removed. The average adult has 9-12 pints (4.3-5.7 L) of blood in the body.  This is generally a safe procedure, but it can sometimes cause problems such as nausea, light-headedness, or low blood pressure (hypotension). This information is not intended to replace advice given to you by your health care provider. Make sure you discuss any questions you have with your health care provider. Document Released: 06/16/2010 Document Revised: 01/28/2017 Document Reviewed: 01/28/2017 Elsevier Interactive Patient Education  2019 [employer] Inc[employer]  

## 2018-05-13 LAB — IRON AND TIBC
Iron: 58 ug/dL (ref 42–163)
Saturation Ratios: 14 % — ABNORMAL LOW (ref 20–55)
TIBC: 419 ug/dL — ABNORMAL HIGH (ref 202–409)
UIBC: 361 ug/dL (ref 117–376)

## 2018-05-13 LAB — FERRITIN: Ferritin: 5 ng/mL — ABNORMAL LOW (ref 24–336)

## 2018-05-16 ENCOUNTER — Other Ambulatory Visit: Payer: Self-pay | Admitting: Family Medicine

## 2018-05-16 ENCOUNTER — Telehealth: Payer: Self-pay | Admitting: Family

## 2018-05-16 DIAGNOSIS — F411 Generalized anxiety disorder: Secondary | ICD-10-CM

## 2018-05-16 NOTE — Telephone Encounter (Signed)
Appointments scheduled letter/calendar mailed per 4/17 los

## 2018-05-16 NOTE — Telephone Encounter (Signed)
Will refill but he is due for a followup.  Will set up a virtual visit    04/19/2018  2   02/23/2018  Alprazolam 1 MG Tablet  90.00 30 Je Cop   [id number]   [place] (1204)   2  6.00 LME  Private Pay   [place]  03/22/2018  2   02/23/2018  Alprazolam 1 MG Tablet  90.00 30 Je Cop   [id number]   [place] (1204)   1  6.00 LME  Private Pay   [place]  02/23/2018  2   02/23/2018  Alprazolam 1 MG Tablet  90.00 30 Je Cop   [id number]   [place] (1204)   0  6.00 LME  Private Pay   [place]  01/27/2018  2   11/29/2017  Alprazolam 1 MG Tablet  90.00 30 Je Cop   [id number]   [place] (1204)   2  6.00 LME  Private Pay   [place]  12/28/2017  2   11/29/2017  Alprazolam 1 MG Tablet  90.00 30 Je Cop   [id number]   [place] (1204)   1  6.00 LME  Private Pay   [place]  11/29/2017  2   11/29/2017  Alprazolam 1 MG Tablet  90.00 30 Je Cop   [id number]   [place] (1204)   0  6.00 LME  Private Pay   [place]  11/02/2017  2   09/03/2017  Alprazolam 1 MG Tablet  90.00 30 Je Cop   [id number]   [place] (1204)   2  6.00 LME  Private Pay   [place]  10/04/2017  2   09/03/2017  Alprazolam 1 MG Tablet  90.00 30 Je Cop   [id number]   [place] (1204)   1  6.00 LME  Private Pay   [place]  09/03/2017  2   09/03/2017  Alprazolam 1 MG Tablet  90.00 30 Je Cop   [id number]   [place] (1204)   0  6.00 LME  Private Pay   [place]  08/03/2017  2   06/01/2017  Alprazolam 1 MG Tablet  90.00 30 Je Cop   [id number]   [place] (1204)   2  6.00 LME  Private Pay   [place]  07/04/2017  2   06/01/2017  Alprazolam 1 MG Tablet  90.00 30 Je Cop   [id number]   [place] (1204)   1  6.00 LME  Private Pay      Will set up virtual visit

## 2018-05-23 ENCOUNTER — Ambulatory Visit: Payer: Self-pay | Admitting: Hematology & Oncology

## 2018-05-23 ENCOUNTER — Other Ambulatory Visit: Payer: Self-pay

## 2018-07-01 ENCOUNTER — Inpatient Hospital Stay: Payer: Self-pay | Attending: Family

## 2018-07-01 ENCOUNTER — Other Ambulatory Visit: Payer: Self-pay

## 2018-07-01 ENCOUNTER — Inpatient Hospital Stay (HOSPITAL_BASED_OUTPATIENT_CLINIC_OR_DEPARTMENT_OTHER): Payer: Self-pay | Admitting: Family

## 2018-07-01 ENCOUNTER — Inpatient Hospital Stay: Payer: Self-pay

## 2018-07-01 ENCOUNTER — Encounter: Payer: Self-pay | Admitting: Family

## 2018-07-01 VITALS — BP 134/72 | HR 65 | Temp 98.0°F | Resp 18 | Ht 75.0 in | Wt 190.0 lb

## 2018-07-01 DIAGNOSIS — Z79899 Other long term (current) drug therapy: Secondary | ICD-10-CM

## 2018-07-01 DIAGNOSIS — E559 Vitamin D deficiency, unspecified: Secondary | ICD-10-CM

## 2018-07-01 DIAGNOSIS — D751 Secondary polycythemia: Secondary | ICD-10-CM | POA: Insufficient documentation

## 2018-07-01 DIAGNOSIS — D500 Iron deficiency anemia secondary to blood loss (chronic): Secondary | ICD-10-CM

## 2018-07-01 DIAGNOSIS — G510 Bell's palsy: Secondary | ICD-10-CM | POA: Insufficient documentation

## 2018-07-01 LAB — CMP (CANCER CENTER ONLY)
ALT: 14 U/L (ref 0–44)
AST: 15 U/L (ref 15–41)
Albumin: 4.7 g/dL (ref 3.5–5.0)
Alkaline Phosphatase: 52 U/L (ref 38–126)
Anion gap: 7 (ref 5–15)
BUN: 15 mg/dL (ref 6–20)
CO2: 29 mmol/L (ref 22–32)
Calcium: 9 mg/dL (ref 8.9–10.3)
Chloride: 101 mmol/L (ref 98–111)
Creatinine: 0.9 mg/dL (ref 0.61–1.24)
GFR, Est AFR Am: >60 mL/min (ref >60)
GFR, Estimated: >60 mL/min (ref >60)
Glucose, Bld: 98 mg/dL (ref 70–99)
Potassium: 4.6 mmol/L (ref 3.5–5.1)
Sodium: 137 mmol/L (ref 135–145)
Total Bilirubin: 0.5 mg/dL (ref 0.3–1.2)
Total Protein: 7.7 g/dL (ref 6.5–8.1)

## 2018-07-01 LAB — CBC WITH DIFFERENTIAL (CANCER CENTER ONLY)
Abs Immature Granulocytes: 0.02 10*3/uL (ref 0.00–0.07)
Basophils Absolute: 0.1 10*3/uL (ref 0.0–0.1)
Basophils Relative: 1 %
Eosinophils Absolute: 0.1 10*3/uL (ref 0.0–0.5)
Eosinophils Relative: 1 %
HCT: 47.4 % (ref 39.0–52.0)
Hemoglobin: 15.5 g/dL (ref 13.0–17.0)
Immature Granulocytes: 0 %
Lymphocytes Relative: 20 %
Lymphs Abs: 1.9 10*3/uL (ref 0.7–4.0)
MCH: 29.1 pg (ref 26.0–34.0)
MCHC: 32.7 g/dL (ref 30.0–36.0)
MCV: 88.9 fL (ref 80.0–100.0)
Monocytes Absolute: 0.9 10*3/uL (ref 0.1–1.0)
Monocytes Relative: 10 %
Neutro Abs: 6.6 10*3/uL (ref 1.7–7.7)
Neutrophils Relative %: 68 %
Platelet Count: 249 10*3/uL (ref 150–400)
RBC: 5.33 MIL/uL (ref 4.22–5.81)
RDW: 14.5 % (ref 11.5–15.5)
WBC Count: 9.6 10*3/uL (ref 4.0–10.5)
nRBC: 0 % (ref 0.0–0.2)

## 2018-07-01 LAB — IRON AND TIBC
Iron: 100 ug/dL (ref 42–163)
Saturation Ratios: 24 % (ref 20–55)
TIBC: 421 ug/dL — ABNORMAL HIGH (ref 202–409)
UIBC: 322 ug/dL (ref 117–376)

## 2018-07-01 LAB — LACTATE DEHYDROGENASE: LDH: 168 U/L (ref 98–192)

## 2018-07-01 LAB — FERRITIN: Ferritin: 16 ng/mL — ABNORMAL LOW (ref 24–336)

## 2018-07-01 NOTE — Patient Instructions (Signed)

## 2018-07-01 NOTE — Progress Notes (Signed)
Hematology and Oncology Follow Up Visit  Stuart Hill [id number] 08-Feb-1970 48 y.o. 07/01/2018   Principle Diagnosis:  Secondary polycythemia JAK2 (-), smoker  LEFT Bell's palsy  Current Therapy:   Phlebotomy to maintain hematocrit below 42% Plavix 75 mg po q day- stoppedhimself and does not want to restart   Interim History:  Stuart Hill is here today for follow-up and phlebotomy. He has noted fatigue, blurry vision and brain fog.  He states that he is trying to pass a kidney stone right now and has had some intermittent pain in the right side. He is drinking lots of fluids to help facilitate this.  No episodes of bleeding, no bruising or petechiae.  He is making an effort to eat healthier and avoiding bread. His weight is stable.  No fever, chills, n/v, cough, rash, dizziness, SOB, chest pain, palpitations, abdominal pain or changes in bowel or bladder habits.  No swelling, tenderness, numbness or tingling in his extremities.  No lymphadenopathy noted on exam.   ECOG Performance Status: 1 - Symptomatic but completely ambulatory  Medications:  Allergies as of 07/01/2018      Reactions   Asa [aspirin] Hives   Acetaminophen Hives, Other (See Comments)   Afrin [nasal Spray] Hives   Antihistamines, Chlorpheniramine-type Hives   Benadryl [diphenhydramine Hcl] Hives   Buprenorphine Hcl Hives, Other (See Comments)   Carisoprodol Hives, Other (See Comments)   Codeine Hives, Other (See Comments)   Diphenhydramine Hcl Other (See Comments), Hives   Ibuprofen Hives, Other (See Comments)   Ipratropium Hives, Other (See Comments)   Metoclopramide Hives, Other (See Comments)   Morphine And Related Hives   Other Hives   Oxymetazoline Hives, Other (See Comments)   Plavix [clopidogrel Bisulfate] Other (See Comments)   Confusion, lethargy, slurred speech   Pheniramine Hives, Rash, Other (See Comments)      Medication List       Accurate as of July 01, 2018  9:19 AM. If you have  any questions, ask your nurse or doctor.        ALPRAZolam 1 MG tablet Commonly known as:  XANAX TAKE 1 TABLET BY MOUTH THREE TIMES A DAY AS NEEDED FOR ANXIETY   esomeprazole 20 MG capsule Commonly known as:  NEXIUM Take 20 mg by mouth daily at 12 noon.   meloxicam 15 MG tablet Commonly known as:  MOBIC Take 1/2 or 1 daily -Use as needed for joint pain   sildenafil 20 MG tablet Commonly known as:  REVATIO Take 20- 60 mg po prior to sexual activity as needed   triamcinolone cream 0.5 % Commonly known as:  KENALOG APPLY TO AFFECTED AREA TWICE A DAY   Vitamin D (Ergocalciferol) 1.25 MG (50000 UT) Caps capsule Commonly known as:  DRISDOL TAKE ONE CAPSULE BY MOUTH ONE TIME PER WEEK       Allergies:  Allergies  Allergen Reactions  . Asa [Aspirin] Hives  . Acetaminophen Hives and Other (See Comments)  . Afrin [Nasal Spray] Hives  . Antihistamines, Chlorpheniramine-Type Hives  . Benadryl [Diphenhydramine Hcl] Hives  . Buprenorphine Hcl Hives and Other (See Comments)  . Carisoprodol Hives and Other (See Comments)  . Codeine Hives and Other (See Comments)  . Diphenhydramine Hcl Other (See Comments) and Hives  . Ibuprofen Hives and Other (See Comments)  . Ipratropium Hives and Other (See Comments)  . Metoclopramide Hives and Other (See Comments)  . Morphine And Related Hives  . Other Hives  . Oxymetazoline Hives and Other (  See Comments)  . Plavix [Clopidogrel Bisulfate] Other (See Comments)    Confusion, lethargy, slurred speech  . Pheniramine Hives, Rash and Other (See Comments)    Past Medical History, Surgical history, Social history, and Family History were reviewed and updated.  Review of Systems: All other 10 point review of systems is negative.   Physical Exam:  vitals were not taken for this visit.   Wt Readings from Last 3 Encounters:  04/06/18 198 lb 12.8 oz (90.2 kg)  02/16/18 198 lb (89.8 kg)  12/20/17 192 lb (87.1 kg)    Ocular: Sclerae unicteric,  pupils equal, round and reactive to light Ear-nose-throat: Oropharynx clear, dentition fair Lymphatic: No cervical or supraclavicular adenopathy Lungs no rales or rhonchi, good excursion bilaterally Heart regular rate and rhythm, no murmur appreciated Abd soft, nontender, positive bowel sounds, no liver or spleen tip palpated on exam, no fluid wave MSK no focal spinal tenderness, no joint edema Neuro: non-focal, well-oriented, appropriate affect Breasts: Deferred   Lab Results  Component Value Date   WBC 9.6 07/01/2018   HGB 15.5 07/01/2018   HCT 47.4 07/01/2018   MCV 88.9 07/01/2018   PLT 249 07/01/2018   Lab Results  Component Value Date   FERRITIN 5 (L) 05/12/2018   IRON 58 05/12/2018   TIBC 419 (H) 05/12/2018   UIBC 361 05/12/2018   IRONPCTSAT 14 (L) 05/12/2018   Lab Results  Component Value Date   RETICCTPCT 1.0 12/20/2017   RBC 5.33 07/01/2018   No results found for: KPAFRELGTCHN, LAMBDASER, KAPLAMBRATIO No results found for: IGGSERUM, IGA, IGMSERUM No results found for: Kathrynn Ducking, MSPIKE, SPEI   Chemistry      Component Value Date/Time   NA 133 (L) 05/12/2018 1256   NA 143 01/01/2017 1453   NA 139 11/12/2015 0945   K 3.8 05/12/2018 1256   K 4.3 01/01/2017 1453   K 3.7 11/12/2015 0945   CL 101 05/12/2018 1256   CL 103 01/01/2017 1453   CO2 27 05/12/2018 1256   CO2 27 01/01/2017 1453   CO2 22 11/12/2015 0945   BUN 14 05/12/2018 1256   BUN 18 01/01/2017 1453   BUN 13.1 11/12/2015 0945   CREATININE 0.95 05/12/2018 1256   CREATININE 1.0 01/01/2017 1453   CREATININE 0.9 11/12/2015 0945      Component Value Date/Time   CALCIUM 9.1 05/12/2018 1256   CALCIUM 8.8 01/01/2017 1453   CALCIUM 8.9 11/12/2015 0945   ALKPHOS 47 05/12/2018 1256   ALKPHOS 44 01/01/2017 1453   ALKPHOS 66 11/12/2015 0945   AST 16 05/12/2018 1256   AST 13 11/12/2015 0945   ALT 12 05/12/2018 1256   ALT 368 (HH) 01/01/2017 1453   ALT 14  11/12/2015 0945   BILITOT 0.5 05/12/2018 1256   BILITOT <0.22 11/12/2015 0945       Impression and Plan: Stuart Hill is a very pleasant 48 yo caucasian male with 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  Hct today is 47.4% and he is symptomatic as mentioned above.  He was phlebotomized today.  We will see him back in another 2 months for follow-up.  He will contact our office with any questions or concerns. We can certainly see him sooner if need be.   [name], NP 6/5/20209:19 AM

## 2018-07-01 NOTE — Progress Notes (Signed)
Phlebotomy to right ac via phlebotomy kit, 525 grams removed without difficulty from 0947-1000.  Pressure dressing applied.  Pt refused snack, but did take drink.  Pt refused to stay for 30 minutes post phlebotomy and is without complaints at time of discharge.

## 2018-07-02 LAB — VITAMIN D 25 HYDROXY (VIT D DEFICIENCY, FRACTURES): Vit D, 25-Hydroxy: 41.4 ng/mL (ref 30.0–100.0)

## 2018-07-06 ENCOUNTER — Telehealth: Payer: Self-pay | Admitting: Family

## 2018-07-06 NOTE — Telephone Encounter (Signed)
Appointments scheduled letter amiled per 6/5 los

## 2018-07-12 ENCOUNTER — Other Ambulatory Visit: Payer: Self-pay

## 2018-07-12 ENCOUNTER — Ambulatory Visit: Payer: Self-pay | Admitting: Family

## 2018-08-08 ENCOUNTER — Other Ambulatory Visit: Payer: Self-pay | Admitting: Family Medicine

## 2018-08-08 DIAGNOSIS — F411 Generalized anxiety disorder: Secondary | ICD-10-CM

## 2018-08-08 NOTE — Telephone Encounter (Signed)
Last alprazolam RX:  05/16/18, #90 x 2 refills Last OV: 11/08/17 Next OV: none scheduled UDS: NOT on file CSC: NOT on file

## 2018-09-01 ENCOUNTER — Encounter: Payer: Self-pay | Admitting: Hematology & Oncology

## 2018-09-01 ENCOUNTER — Other Ambulatory Visit: Payer: Self-pay

## 2018-09-01 ENCOUNTER — Inpatient Hospital Stay: Payer: Self-pay | Attending: Family

## 2018-09-01 ENCOUNTER — Inpatient Hospital Stay: Payer: Self-pay

## 2018-09-01 ENCOUNTER — Inpatient Hospital Stay (HOSPITAL_BASED_OUTPATIENT_CLINIC_OR_DEPARTMENT_OTHER): Payer: Self-pay | Admitting: Hematology & Oncology

## 2018-09-01 VITALS — BP 133/74 | HR 65 | Temp 97.7°F | Resp 20 | Wt 190.1 lb

## 2018-09-01 VITALS — BP 120/73 | HR 57

## 2018-09-01 DIAGNOSIS — D45 Polycythemia vera: Secondary | ICD-10-CM

## 2018-09-01 DIAGNOSIS — D500 Iron deficiency anemia secondary to blood loss (chronic): Secondary | ICD-10-CM

## 2018-09-01 DIAGNOSIS — F17210 Nicotine dependence, cigarettes, uncomplicated: Secondary | ICD-10-CM | POA: Insufficient documentation

## 2018-09-01 DIAGNOSIS — D751 Secondary polycythemia: Secondary | ICD-10-CM

## 2018-09-01 DIAGNOSIS — R7989 Other specified abnormal findings of blood chemistry: Secondary | ICD-10-CM | POA: Insufficient documentation

## 2018-09-01 DIAGNOSIS — G510 Bell's palsy: Secondary | ICD-10-CM | POA: Insufficient documentation

## 2018-09-01 LAB — CBC WITH DIFFERENTIAL (CANCER CENTER ONLY)
Abs Immature Granulocytes: 0.03 10*3/uL (ref 0.00–0.07)
Basophils Absolute: 0.1 10*3/uL (ref 0.0–0.1)
Basophils Relative: 1 %
Eosinophils Absolute: 0.1 10*3/uL (ref 0.0–0.5)
Eosinophils Relative: 2 %
HCT: 46.9 % (ref 39.0–52.0)
Hemoglobin: 15.4 g/dL (ref 13.0–17.0)
Immature Granulocytes: 0 %
Lymphocytes Relative: 20 %
Lymphs Abs: 1.8 10*3/uL (ref 0.7–4.0)
MCH: 30.1 pg (ref 26.0–34.0)
MCHC: 32.8 g/dL (ref 30.0–36.0)
MCV: 91.6 fL (ref 80.0–100.0)
Monocytes Absolute: 0.8 10*3/uL (ref 0.1–1.0)
Monocytes Relative: 9 %
Neutro Abs: 6.4 10*3/uL (ref 1.7–7.7)
Neutrophils Relative %: 68 %
Platelet Count: 249 10*3/uL (ref 150–400)
RBC: 5.12 MIL/uL (ref 4.22–5.81)
RDW: 13.8 % (ref 11.5–15.5)
WBC Count: 9.2 10*3/uL (ref 4.0–10.5)
nRBC: 0 % (ref 0.0–0.2)

## 2018-09-01 LAB — IRON AND TIBC
Iron: 41 ug/dL — ABNORMAL LOW (ref 42–163)
Saturation Ratios: 11 % — ABNORMAL LOW (ref 20–55)
TIBC: 390 ug/dL (ref 202–409)
UIBC: 349 ug/dL (ref 117–376)

## 2018-09-01 LAB — CMP (CANCER CENTER ONLY)
ALT: 15 U/L (ref 0–44)
AST: 16 U/L (ref 15–41)
Albumin: 4.1 g/dL (ref 3.5–5.0)
Alkaline Phosphatase: 47 U/L (ref 38–126)
Anion gap: 6 (ref 5–15)
BUN: 14 mg/dL (ref 6–20)
CO2: 30 mmol/L (ref 22–32)
Calcium: 8.6 mg/dL — ABNORMAL LOW (ref 8.9–10.3)
Chloride: 104 mmol/L (ref 98–111)
Creatinine: 0.75 mg/dL (ref 0.61–1.24)
GFR, Est AFR Am: >60 mL/min (ref >60)
GFR, Estimated: >60 mL/min (ref >60)
Glucose, Bld: 88 mg/dL (ref 70–99)
Potassium: 4.3 mmol/L (ref 3.5–5.1)
Sodium: 140 mmol/L (ref 135–145)
Total Bilirubin: 0.4 mg/dL (ref 0.3–1.2)
Total Protein: 6.8 g/dL (ref 6.5–8.1)

## 2018-09-01 LAB — FERRITIN: Ferritin: 18 ng/mL — ABNORMAL LOW (ref 24–336)

## 2018-09-01 LAB — LACTATE DEHYDROGENASE: LDH: 170 U/L (ref 98–192)

## 2018-09-01 NOTE — Patient Instructions (Signed)

## 2018-09-01 NOTE — Progress Notes (Signed)
Hematology and Oncology Follow Up Visit  Stuart Hill [id number] 01/03/1971 48 y.o. 09/01/2018   Principle Diagnosis:  Secondary polycythemia JAK2 (-), smoker  LEFT Bell's palsy  Current Therapy:   Phlebotomy to maintain hematocrit below 42% Plavix 75 mg po q day- stoppedhimself and does not want to restart   Interim History:  Stuart Hill is here today for follow-up.  He is very busy with work.  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  Despite the coronavirus, it is nice to hear that he is working out a bit.  He is losing weight.  He is trying to lose weight.  He is able to go to his gym.  He goes 4 times a day.  He has had no problems with his appetite.  He is really changed his diet around.  He is eating a lot of fruits and vegetables.  He has had no headache.  He has had no mouth sores.  He has had no nausea or vomiting.  He has had no bleeding.  His wife just had knee surgery.  He is helping her out.    Overall, his performance status is ECOG 1.    Medications:  Allergies as of 09/01/2018      Reactions   Asa [aspirin] Hives   Acetaminophen Hives, Other (See Comments)   Afrin [nasal Spray] Hives   Antihistamines, Chlorpheniramine-type Hives   Benadryl [diphenhydramine Hcl] Hives   Buprenorphine Hcl Hives, Other (See Comments)   Carisoprodol Hives, Other (See Comments)   Codeine Hives, Other (See Comments)   Diphenhydramine Hcl Other (See Comments), Hives   Ibuprofen Hives, Other (See Comments)   Ipratropium Hives, Other (See Comments)   Metoclopramide Hives, Other (See Comments)   Morphine And Related Hives   Other Hives   Oxymetazoline Hives, Other (See Comments)   Plavix [clopidogrel Bisulfate] Other (See Comments)   Confusion, lethargy, slurred speech   Pheniramine Hives, Rash, Other (See Comments)      Medication List       Accurate as of September 01, 2018  9:43 AM. If you have any questions, ask your nurse or doctor.        ALPRAZolam 1 MG  tablet Commonly known as: XANAX TAKE 1 TABLET BY MOUTH THREE TIMES A DAY AS NEEDED FOR ANXIETY.  Please come in for visit   meloxicam 15 MG tablet Commonly known as: MOBIC Take 1/2 or 1 daily -Use as needed for joint pain   sildenafil 20 MG tablet Commonly known as: REVATIO Take 20- 60 mg po prior to sexual activity as needed   Vitamin D (Ergocalciferol) 1.25 MG (50000 UT) Caps capsule Commonly known as: DRISDOL TAKE ONE CAPSULE BY MOUTH ONE TIME PER WEEK       Allergies:  Allergies  Allergen Reactions  . Asa [Aspirin] Hives  . Acetaminophen Hives and Other (See Comments)  . Afrin [Nasal Spray] Hives  . Antihistamines, Chlorpheniramine-Type Hives  . Benadryl [Diphenhydramine Hcl] Hives  . Buprenorphine Hcl Hives and Other (See Comments)  . Carisoprodol Hives and Other (See Comments)  . Codeine Hives and Other (See Comments)  . Diphenhydramine Hcl Other (See Comments) and Hives  . Ibuprofen Hives and Other (See Comments)  . Ipratropium Hives and Other (See Comments)  . Metoclopramide Hives and Other (See Comments)  . Morphine And Related Hives  . Other Hives  . Oxymetazoline Hives and Other (See Comments)  . Plavix [Clopidogrel Bisulfate] Other (See Comments)  Confusion, lethargy, slurred speech  . Pheniramine Hives, Rash and Other (See Comments)    Past Medical History, Surgical history, Social history, and Family History were reviewed and updated.  Review of Systems: Review of Systems  Constitutional: Negative.   HENT: Negative.   Eyes: Negative.   Respiratory: Negative.   Cardiovascular: Negative.   Gastrointestinal: Negative.   Genitourinary: Negative.   Musculoskeletal: Negative.   Skin: Negative.   Neurological: Negative.   Endo/Heme/Allergies: Negative.   Psychiatric/Behavioral: Negative.       Physical Exam:  weight is 190 lb 1.9 oz (86.2 kg). His oral temperature is 97.7 F (36.5 C). His blood pressure is 133/74 and his pulse is 65. His  respiration is 20 and oxygen saturation is 100%.   Wt Readings from Last 3 Encounters:  09/01/18 190 lb 1.9 oz (86.2 kg)  07/01/18 190 lb (86.2 kg)  04/06/18 198 lb 12.8 oz (90.2 kg)    Physical Exam Vitals signs reviewed.  HENT:     Head: Normocephalic and atraumatic.  Eyes:     Pupils: Pupils are equal, round, and reactive to light.  Neck:     Musculoskeletal: Normal range of motion.  Cardiovascular:     Rate and Rhythm: Normal rate and regular rhythm.     Heart sounds: Normal heart sounds.  Pulmonary:     Effort: Pulmonary effort is normal.     Breath sounds: Normal breath sounds.  Abdominal:     General: Bowel sounds are normal.     Palpations: Abdomen is soft.  Musculoskeletal: Normal range of motion.        General: No tenderness or deformity.  Lymphadenopathy:     Cervical: No cervical adenopathy.  Skin:    General: Skin is warm and dry.     Findings: No erythema or rash.  Neurological:     Mental Status: He is alert and oriented to person, place, and time.  Psychiatric:        Behavior: Behavior normal.        Thought Content: Thought content normal.        Judgment: Judgment normal.      Lab Results  Component Value Date   WBC 9.2 09/01/2018   HGB 15.4 09/01/2018   HCT 46.9 09/01/2018   MCV 91.6 09/01/2018   PLT 249 09/01/2018   Lab Results  Component Value Date   FERRITIN 16 (L) 07/01/2018   IRON 100 07/01/2018   TIBC 421 (H) 07/01/2018   UIBC 322 07/01/2018   IRONPCTSAT 24 07/01/2018   Lab Results  Component Value Date   RETICCTPCT 1.0 12/20/2017   RBC 5.12 09/01/2018   No results found for: KPAFRELGTCHN, LAMBDASER, KAPLAMBRATIO No results found for: IGGSERUM, IGA, IGMSERUM No results found for: Odetta Pink, SPEI   Chemistry      Component Value Date/Time   NA 140 09/01/2018 0836   NA 143 01/01/2017 1453   NA 139 11/12/2015 0945   K 4.3 09/01/2018 0836   K 4.3 01/01/2017 1453    K 3.7 11/12/2015 0945   CL 104 09/01/2018 0836   CL 103 01/01/2017 1453   CO2 30 09/01/2018 0836   CO2 27 01/01/2017 1453   CO2 22 11/12/2015 0945   BUN 14 09/01/2018 0836   BUN 18 01/01/2017 1453   BUN 13.1 11/12/2015 0945   CREATININE 0.75 09/01/2018 0836   CREATININE 1.0 01/01/2017 1453   CREATININE 0.9 11/12/2015 0945  Component Value Date/Time   CALCIUM 8.6 (L) 09/01/2018 0836   CALCIUM 8.8 01/01/2017 1453   CALCIUM 8.9 11/12/2015 0945   ALKPHOS 47 09/01/2018 0836   ALKPHOS 44 01/01/2017 1453   ALKPHOS 66 11/12/2015 0945   AST 16 09/01/2018 0836   AST 13 11/12/2015 0945   ALT 15 09/01/2018 0836   ALT 368 (HH) 01/01/2017 1453   ALT 14 11/12/2015 0945   BILITOT 0.4 09/01/2018 0836   BILITOT <0.22 11/12/2015 0945      Impression and Plan: Stuart Hill is a very pleasant 48 yo white male with history of polycythemia vera.  I realize that he is Jak 2-.  However, he has a very low erythropoietin level.  We will go ahead and phlebotomize him today.  I think this would certainly be a good idea.  We really want to be aggressive with keeping his hematocrit below 42%.  We will plan to get him back in another 4 weeks or so.Marland Kitchen     Volanda Napoleon, MD 8/6/20209:43 AM

## 2018-09-11 ENCOUNTER — Other Ambulatory Visit: Payer: Self-pay | Admitting: Family Medicine

## 2018-09-11 DIAGNOSIS — F411 Generalized anxiety disorder: Secondary | ICD-10-CM

## 2018-09-14 NOTE — Progress Notes (Signed)
[facility], [place], [place] [address] [phone]  Date:  09/19/2018   Name:  Stuart Hill   DOB:  17-Apr-1970   MRN:  [id number]  PCP:  Darreld Mclean, MD    Chief Complaint: Med check   History of Present Illness:  Stuart Hill is a 48 y.o. very pleasant male patient who presents with the following:  Virtual visit today for a med check Pt with history of polycythemia, anxiety which I am treating with xanax Tonsillar hypertrophy and OSA s/o tonsillectomy  Seen by oncology just recently - they did phlebotomy, goal is to keep hct under 42% He stopped taking plavix He notes that he is stable on his xanax - he takes 1 mg about 3x a day Sleep is pretty good, some days he had a harder time  He is trying to eat a healthier diet Anxiety is still present but baseline   Married to [name] who is also my patient- she just had knee surgery She is recovering ok Works in [place] to get into a discussion with him about if wearing face masks, etc is helpful during current pandemic.    08/12/2018  2   08/09/2018  Alprazolam 1 MG Tablet  90.00 30 Je Cop   [id number]   [place] (1204)   0  6.00 LME  Private Pay   [place]  07/16/2018  2   05/16/2018  Alprazolam 1 MG Tablet  90.00 30 Je Cop   [id number]   [place] (1204)   2  6.00 LME  Private Pay   [place]  06/14/2018  2   05/16/2018  Alprazolam 1 MG Tablet  90.00 30 Je Cop   [id number]   [place] (1204)   1  6.00 LME  Private Pay   [place]  05/25/2018  2   05/25/2018  Tramadol Hcl 50 MG Tablet  15.00 [place]   [id number]   [place] (1204)   0  15.00 MME  Private Pay   [place]  05/16/2018  2   05/16/2018  Alprazolam 1 MG Tablet  90.00 30 Je Cop   [id number]   [place] (1204)   0  6.00 LME  Private Pay   [place]  04/19/2018  2   02/23/2018  Alprazolam 1 MG Tablet  90.00 30 Je Cop   [id number]   [place] (1204)   2  6.00 LME  Private Pay   [place]  03/22/2018  2   02/23/2018  Alprazolam 1 MG Tablet  90.00 30 Je Cop    [id number]   [place] (1204)   1  6.00 LME  Private Pay   [place]  02/23/2018  2   02/23/2018  Alprazolam 1 MG Tablet  90.00 30 Je Cop   [id number]   [place] (1204)   0  6.00 LME  Private Pay   [place]  01/27/2018  2   11/29/2017  Alprazolam 1 MG Tablet  90.00 30 Je Cop   [id number]   [place] (1204)   2  6.00 LME       Patient Active Problem List   Diagnosis Date Noted  . Tonsillar hypertrophy 04/30/2017  . Obstructive sleep apnea 04/23/2017  . 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 Polycythemia vera ([place]) 05/03/2015  . Hemoptysis 04/03/2015  . Condylomata acuminata 01/18/2015  . Generalized anxiety disorder 03/05/2014    Past Medical History:  Diagnosis Date  . Allergy   .  Anxiety   . Arthritis   . Bell's palsy   . Erythrocytosis 04/10/2015  . Hyperlipidemia   . Polycythemia vera ([place]) 05/03/2015    Past Surgical History:  Procedure Laterality Date  . DENTAL SURGERY    . HAND SURGERY    . TONSILLECTOMY Bilateral 04/30/2017   Procedure: TONSILLECTOMY;  Surgeon: Jerrell Belfast, MD;  Location: [facility];  Service: ENT;  Laterality: Bilateral;    Social History   Tobacco Use  . Smoking status: Current Every Day Smoker    Packs/day: 1.00    Years: 24.00    Pack years: 24.00    Types: Cigarettes  . Smokeless tobacco: Never Used  Substance Use Topics  . Alcohol use: Yes    Alcohol/week: 1.0 standard drinks    Types: 1 Cans of beer per week    Comment: 6-10 beer a week  . Drug use: Yes    Types: Other-see comments, Marijuana    Comment: Marijuana every other day.    Family History  Problem Relation Age of Onset  . COPD Mother   . Fibromyalgia Mother   . Heart disease Father   . Stroke Father   . Depression Brother   . Heart disease Brother   . Heart disease Maternal Grandmother   . Stroke Maternal Grandmother   . Heart disease Maternal Grandfather   . Stroke Maternal Grandfather   . Heart disease Paternal Grandmother   . Stroke Paternal Grandmother   . Heart disease  Paternal Grandfather   . Stroke Paternal Grandfather   . Stroke Paternal Aunt     Allergies  Allergen Reactions  . Asa [Aspirin] Hives  . Acetaminophen Hives and Other (See Comments)  . Afrin [Nasal Spray] Hives  . Antihistamines, Chlorpheniramine-Type Hives  . Benadryl [Diphenhydramine Hcl] Hives  . Buprenorphine Hcl Hives and Other (See Comments)  . Carisoprodol Hives and Other (See Comments)  . Codeine Hives and Other (See Comments)  . Diphenhydramine Hcl Other (See Comments) and Hives  . Ibuprofen Hives and Other (See Comments)  . Metoclopramide Hives and Other (See Comments)  . Morphine And Related Hives  . Other Hives  . Oxymetazoline Hives and Other (See Comments)  . Plavix [Clopidogrel Bisulfate] Other (See Comments)    Confusion, lethargy, slurred speech  . Pheniramine Hives, Rash and Other (See Comments)    Medication list has been reviewed and updated.  Current Outpatient Medications on File Prior to Visit  Medication Sig Dispense Refill  . ALPRAZolam (XANAX) 1 MG tablet TAKE 1 TABLET BY MOUTH THREE TIMES A DAY AS NEEDED FOR ANXIETY.  Office visit required please 30 tablet 0  . sildenafil (REVATIO) 20 MG tablet Take 20- 60 mg po prior to sexual activity as needed    . meloxicam (MOBIC) 15 MG tablet Take 1/2 or 1 daily -Use as needed for joint pain (Patient not taking: Reported on 09/19/2018) 30 tablet 3   No current facility-administered medications on file prior to visit.     Review of Systems:  As per HPI- otherwise negative.   Physical Examination: Vitals:   09/19/18 1053  BP: 122/70  Pulse: 74  Resp: 16  Temp: 98.2 F (36.8 C)  SpO2: 98%   Vitals:   09/19/18 1053  Weight: 189 lb (85.7 kg)  Height: 6\' 3"  (1.905 m)   Body mass index is 23.62 kg/m. Ideal Body Weight: Weight in (lb) to have BMI = 25: 199.6  GEN: WDWN, NAD, Non-toxic, A & O x  3, normal weight, looks well  HEENT: Atraumatic, Normocephalic. Neck supple. No masses, No LAD. Ears and  Nose: No external deformity. CV: RRR, No M/G/R. No JVD. No thrill. No extra heart sounds. PULM: CTA B, no wheezes, crackles, rhonchi. No retractions. No resp. distress. No accessory muscle use. ABD: S, NT, ND. No rebound. No HSM. EXTR: No c/c/e NEURO Normal gait.  PSYCH: Normally interactive. Conversant. Not depressed or anxious appearing.  Calm demeanor.   Wt Readings from Last 3 Encounters:  09/19/18 189 lb (85.7 kg)  09/01/18 190 lb 1.9 oz (86.2 kg)  07/01/18 190 lb (86.2 kg)     Assessment and Plan: Generalized anxiety disorder - Plan: ALPRAZolam (XANAX) 1 MG tablet  Here today for a med check Continue xanax as above Recheck in 6 months Declines flu shot    Signed Lamar Blinks, MD

## 2018-09-19 ENCOUNTER — Other Ambulatory Visit: Payer: Self-pay

## 2018-09-19 ENCOUNTER — Ambulatory Visit (INDEPENDENT_AMBULATORY_CARE_PROVIDER_SITE_OTHER): Payer: Self-pay | Admitting: Family Medicine

## 2018-09-19 ENCOUNTER — Encounter: Payer: Self-pay | Admitting: Family Medicine

## 2018-09-19 DIAGNOSIS — F411 Generalized anxiety disorder: Secondary | ICD-10-CM

## 2018-09-19 MED ORDER — ALPRAZOLAM 1 MG PO TABS: ORAL_TABLET | ORAL | 3 refills | Status: DC

## 2018-09-19 NOTE — Patient Instructions (Signed)
Good to see you today- give my best to [place]! I refilled your alprazolam.  Please plan to see me in about 6 months for a med check

## 2018-10-06 ENCOUNTER — Other Ambulatory Visit: Payer: Self-pay

## 2018-10-06 ENCOUNTER — Ambulatory Visit: Payer: Self-pay | Admitting: Hematology & Oncology

## 2018-10-12 ENCOUNTER — Encounter: Payer: Self-pay | Admitting: Family

## 2018-10-12 ENCOUNTER — Other Ambulatory Visit: Payer: Self-pay

## 2018-10-12 ENCOUNTER — Telehealth: Payer: Self-pay | Admitting: Hematology & Oncology

## 2018-10-12 ENCOUNTER — Inpatient Hospital Stay: Payer: Self-pay | Attending: Family

## 2018-10-12 ENCOUNTER — Inpatient Hospital Stay: Payer: Self-pay

## 2018-10-12 ENCOUNTER — Inpatient Hospital Stay (HOSPITAL_BASED_OUTPATIENT_CLINIC_OR_DEPARTMENT_OTHER): Payer: Self-pay | Admitting: Family

## 2018-10-12 VITALS — BP 130/80 | HR 63 | Temp 98.0°F | Resp 18 | Ht 75.0 in | Wt 192.0 lb

## 2018-10-12 DIAGNOSIS — R5383 Other fatigue: Secondary | ICD-10-CM | POA: Insufficient documentation

## 2018-10-12 DIAGNOSIS — D45 Polycythemia vera: Secondary | ICD-10-CM

## 2018-10-12 DIAGNOSIS — R51 Headache: Secondary | ICD-10-CM | POA: Insufficient documentation

## 2018-10-12 DIAGNOSIS — D751 Secondary polycythemia: Secondary | ICD-10-CM

## 2018-10-12 DIAGNOSIS — G510 Bell's palsy: Secondary | ICD-10-CM | POA: Insufficient documentation

## 2018-10-12 DIAGNOSIS — F17210 Nicotine dependence, cigarettes, uncomplicated: Secondary | ICD-10-CM | POA: Insufficient documentation

## 2018-10-12 DIAGNOSIS — D500 Iron deficiency anemia secondary to blood loss (chronic): Secondary | ICD-10-CM

## 2018-10-12 LAB — CMP (CANCER CENTER ONLY)
ALT: 15 U/L (ref 0–44)
AST: 18 U/L (ref 15–41)
Albumin: 4.4 g/dL (ref 3.5–5.0)
Alkaline Phosphatase: 49 U/L (ref 38–126)
Anion gap: 7 (ref 5–15)
BUN: 13 mg/dL (ref 6–20)
CO2: 30 mmol/L (ref 22–32)
Calcium: 9.2 mg/dL (ref 8.9–10.3)
Chloride: 105 mmol/L (ref 98–111)
Creatinine: 0.87 mg/dL (ref 0.61–1.24)
GFR, Est AFR Am: >60 mL/min (ref >60)
GFR, Estimated: >60 mL/min (ref >60)
Glucose, Bld: 60 mg/dL — ABNORMAL LOW (ref 70–99)
Potassium: 4.1 mmol/L (ref 3.5–5.1)
Sodium: 142 mmol/L (ref 135–145)
Total Bilirubin: 0.4 mg/dL (ref 0.3–1.2)
Total Protein: 7 g/dL (ref 6.5–8.1)

## 2018-10-12 LAB — IRON AND TIBC
Iron: 54 ug/dL (ref 42–163)
Saturation Ratios: 14 % — ABNORMAL LOW (ref 20–55)
TIBC: 393 ug/dL (ref 202–409)
UIBC: 339 ug/dL (ref 117–376)

## 2018-10-12 LAB — FERRITIN: Ferritin: 11 ng/mL — ABNORMAL LOW (ref 24–336)

## 2018-10-12 LAB — CBC WITH DIFFERENTIAL (CANCER CENTER ONLY)
Abs Immature Granulocytes: 0.02 10*3/uL (ref 0.00–0.07)
Basophils Absolute: 0.1 10*3/uL (ref 0.0–0.1)
Basophils Relative: 1 %
Eosinophils Absolute: 0.1 10*3/uL (ref 0.0–0.5)
Eosinophils Relative: 1 %
HCT: 45.7 % (ref 39.0–52.0)
Hemoglobin: 15.1 g/dL (ref 13.0–17.0)
Immature Granulocytes: 0 %
Lymphocytes Relative: 18 %
Lymphs Abs: 1.8 10*3/uL (ref 0.7–4.0)
MCH: 30.5 pg (ref 26.0–34.0)
MCHC: 33 g/dL (ref 30.0–36.0)
MCV: 92.3 fL (ref 80.0–100.0)
Monocytes Absolute: 0.9 10*3/uL (ref 0.1–1.0)
Monocytes Relative: 10 %
Neutro Abs: 6.7 10*3/uL (ref 1.7–7.7)
Neutrophils Relative %: 70 %
Platelet Count: 254 10*3/uL (ref 150–400)
RBC: 4.95 MIL/uL (ref 4.22–5.81)
RDW: 13.6 % (ref 11.5–15.5)
WBC Count: 9.6 10*3/uL (ref 4.0–10.5)
nRBC: 0 % (ref 0.0–0.2)

## 2018-10-12 LAB — LACTATE DEHYDROGENASE: LDH: 182 U/L (ref 98–192)

## 2018-10-12 NOTE — Telephone Encounter (Signed)
Called and LMVM for patient w/ Date/time of next appointments

## 2018-10-12 NOTE — Progress Notes (Signed)
Stuart Hill presents today for phlebotomy per MD orders. Phlebotomy procedure started at 1132 and ended at 1140. 512 grams removed from rt [facility] using 16g phlebotomy kit.  Pt declined 30 minute post observation.  Patient tolerated procedure well. IV needle removed intact.

## 2018-10-12 NOTE — Progress Notes (Signed)
Hematology and Oncology Follow Up Visit  Stuart Hill [id number] 1970/04/12 48 y.o. 10/12/2018   Principle Diagnosis:  Secondary polycythemia JAK2 (-), smoker  LEFT Bell's palsy  Current Therapy:   Phlebotomy to maintain hematocrit below 42% Plavix 75 mg po q day- stoppedhimself and does not want to restart   Interim History:  Stuart Hill is here today for follow-up and phlebotomy. He has noted increased fatigue and occasional headaches. He states that he feels that he needs a phlebotomy.  Hct today is 45.7%.  No episodes of bleeding, no bruising or petechiae.  No fever, chills, n/v, cough, rash, dizziness, SOB, chest pain, palpitations, abdominal pain or changes in bowel or bladder habits.  No swelling, tenderness, numbness or tingling in his extremities at this time.  He is eating well and staying hydrated. His weight is stable.   ECOG Performance Status: 1 - Symptomatic but completely ambulatory  Medications:  Allergies as of 10/12/2018      Reactions   Asa [aspirin] Hives   Plavix [clopidogrel Bisulfate] Other (See Comments)   Confusion, lethargy, slurred speech   Acetaminophen Hives   Afrin [nasal Spray] Hives   Antihistamines, Chlorpheniramine-type Hives   Benadryl [diphenhydramine Hcl] Hives   Buprenorphine Hcl Hives   Carisoprodol Hives   Codeine Hives   Metoclopramide Hives   Morphine And Related Hives   Oxymetazoline Hives   Ibuprofen Hives   Pheniramine Hives, Rash      Medication List       Accurate as of October 12, 2018 11:47 AM. If you have any questions, ask your nurse or doctor.        ALPRAZolam 1 MG tablet Commonly known as: XANAX TAKE 1 TABLET BY MOUTH THREE TIMES A DAY AS NEEDED FOR ANXIETY.   meloxicam 15 MG tablet Commonly known as: MOBIC Take 1/2 or 1 daily -Use as needed for joint pain   sildenafil 20 MG tablet Commonly known as: REVATIO Take 20- 60 mg po prior to sexual activity as needed       Allergies:  Allergies   Allergen Reactions  . Asa [Aspirin] Hives  . Plavix [Clopidogrel Bisulfate] Other (See Comments)    Confusion, lethargy, slurred speech  . Acetaminophen Hives  . Afrin [Nasal Spray] Hives  . Antihistamines, Chlorpheniramine-Type Hives  . Benadryl [Diphenhydramine Hcl] Hives  . Buprenorphine Hcl Hives  . Carisoprodol Hives  . Codeine Hives  . Metoclopramide Hives  . Morphine And Related Hives  . Oxymetazoline Hives  . Ibuprofen Hives  . Pheniramine Hives and Rash    Past Medical History, Surgical history, Social history, and Family History were reviewed and updated.  Review of Systems: All other 10 point review of systems is negative.   Physical Exam:  height is 6\' 3"  (1.905 m) and weight is 192 lb (87.1 kg). His temporal temperature is 98 F (36.7 C). His blood pressure is 130/80 and his pulse is 63. His respiration is 18 and oxygen saturation is 100%.   Wt Readings from Last 3 Encounters:  10/12/18 192 lb (87.1 kg)  09/19/18 189 lb (85.7 kg)  09/01/18 190 lb 1.9 oz (86.2 kg)    Ocular: Sclerae unicteric, pupils equal, round and reactive to light Ear-nose-throat: Oropharynx clear, dentition fair Lymphatic: No cervical or supraclavicular adenopathy Lungs no rales or rhonchi, good excursion bilaterally Heart regular rate and rhythm, no murmur appreciated Abd soft, nontender, positive bowel sounds, no liver or spleen tip palpated on exam, no fluid wave  MSK no  focal spinal tenderness, no joint edema Neuro: non-focal, well-oriented, appropriate affect Breasts: Deferred   Lab Results  Component Value Date   WBC 9.6 10/12/2018   HGB 15.1 10/12/2018   HCT 45.7 10/12/2018   MCV 92.3 10/12/2018   PLT 254 10/12/2018   Lab Results  Component Value Date   FERRITIN 18 (L) 09/01/2018   IRON 41 (L) 09/01/2018   TIBC 390 09/01/2018   UIBC 349 09/01/2018   IRONPCTSAT 11 (L) 09/01/2018   Lab Results  Component Value Date   RETICCTPCT 1.0 12/20/2017   RBC 4.95 10/12/2018    No results found for: KPAFRELGTCHN, LAMBDASER, KAPLAMBRATIO No results found for: IGGSERUM, IGA, IGMSERUM No results found for: Odetta Pink, SPEI   Chemistry      Component Value Date/Time   NA 142 10/12/2018 1041   NA 143 01/01/2017 1453   NA 139 11/12/2015 0945   K 4.1 10/12/2018 1041   K 4.3 01/01/2017 1453   K 3.7 11/12/2015 0945   CL 105 10/12/2018 1041   CL 103 01/01/2017 1453   CO2 30 10/12/2018 1041   CO2 27 01/01/2017 1453   CO2 22 11/12/2015 0945   BUN 13 10/12/2018 1041   BUN 18 01/01/2017 1453   BUN 13.1 11/12/2015 0945   CREATININE 0.87 10/12/2018 1041   CREATININE 1.0 01/01/2017 1453   CREATININE 0.9 11/12/2015 0945      Component Value Date/Time   CALCIUM 9.2 10/12/2018 1041   CALCIUM 8.8 01/01/2017 1453   CALCIUM 8.9 11/12/2015 0945   ALKPHOS 49 10/12/2018 1041   ALKPHOS 44 01/01/2017 1453   ALKPHOS 66 11/12/2015 0945   AST 18 10/12/2018 1041   AST 13 11/12/2015 0945   ALT 15 10/12/2018 1041   ALT 368 (HH) 01/01/2017 1453   ALT 14 11/12/2015 0945   BILITOT 0.4 10/12/2018 1041   BILITOT <0.22 11/12/2015 0945       Impression and Plan: Stuart Hill is a 48 yo caucasian male with secondary polycythemia, heavy smoker.  We will proceed with phlebotomy for Hct 45.7.  We will do monthly lab work and phlebotomy is needed and follow-up in 3 months.  He will contact our office with any questions or concerns. We can certainly see him sooner if needed.   [name], NP 9/16/202011:47 AM

## 2018-10-12 NOTE — Patient Instructions (Signed)

## 2018-10-28 ENCOUNTER — Other Ambulatory Visit: Payer: Self-pay | Admitting: Family Medicine

## 2018-11-11 ENCOUNTER — Other Ambulatory Visit: Payer: Self-pay

## 2018-11-11 ENCOUNTER — Inpatient Hospital Stay: Payer: Self-pay

## 2018-11-11 ENCOUNTER — Inpatient Hospital Stay: Payer: Self-pay | Attending: Family

## 2018-11-11 VITALS — BP 125/75 | HR 63 | Temp 98.0°F | Resp 18

## 2018-11-11 DIAGNOSIS — D45 Polycythemia vera: Secondary | ICD-10-CM | POA: Insufficient documentation

## 2018-11-11 DIAGNOSIS — D751 Secondary polycythemia: Secondary | ICD-10-CM

## 2018-11-11 DIAGNOSIS — D500 Iron deficiency anemia secondary to blood loss (chronic): Secondary | ICD-10-CM | POA: Insufficient documentation

## 2018-11-11 LAB — CMP (CANCER CENTER ONLY)
ALT: 19 U/L (ref 0–44)
AST: 20 U/L (ref 15–41)
Albumin: 4.8 g/dL (ref 3.5–5.0)
Alkaline Phosphatase: 57 U/L (ref 38–126)
Anion gap: 7 (ref 5–15)
BUN: 17 mg/dL (ref 6–20)
CO2: 30 mmol/L (ref 22–32)
Calcium: 9.6 mg/dL (ref 8.9–10.3)
Chloride: 103 mmol/L (ref 98–111)
Creatinine: 0.86 mg/dL (ref 0.61–1.24)
GFR, Est AFR Am: >60 mL/min (ref >60)
GFR, Estimated: >60 mL/min (ref >60)
Glucose, Bld: 80 mg/dL (ref 70–99)
Potassium: 4.4 mmol/L (ref 3.5–5.1)
Sodium: 140 mmol/L (ref 135–145)
Total Bilirubin: 0.5 mg/dL (ref 0.3–1.2)
Total Protein: 7.4 g/dL (ref 6.5–8.1)

## 2018-11-11 LAB — CBC WITH DIFFERENTIAL (CANCER CENTER ONLY)
Abs Immature Granulocytes: 0.03 10*3/uL (ref 0.00–0.07)
Basophils Absolute: 0.1 10*3/uL (ref 0.0–0.1)
Basophils Relative: 1 %
Eosinophils Absolute: 0.1 10*3/uL (ref 0.0–0.5)
Eosinophils Relative: 1 %
HCT: 48.2 % (ref 39.0–52.0)
Hemoglobin: 15.6 g/dL (ref 13.0–17.0)
Immature Granulocytes: 0 %
Lymphocytes Relative: 24 %
Lymphs Abs: 2 10*3/uL (ref 0.7–4.0)
MCH: 30.2 pg (ref 26.0–34.0)
MCHC: 32.4 g/dL (ref 30.0–36.0)
MCV: 93.2 fL (ref 80.0–100.0)
Monocytes Absolute: 0.7 10*3/uL (ref 0.1–1.0)
Monocytes Relative: 8 %
Neutro Abs: 5.7 10*3/uL (ref 1.7–7.7)
Neutrophils Relative %: 66 %
Platelet Count: 241 10*3/uL (ref 150–400)
RBC: 5.17 MIL/uL (ref 4.22–5.81)
RDW: 13.4 % (ref 11.5–15.5)
WBC Count: 8.6 10*3/uL (ref 4.0–10.5)
nRBC: 0 % (ref 0.0–0.2)

## 2018-11-11 NOTE — Patient Instructions (Signed)
Therapeutic Phlebotomy Therapeutic phlebotomy is the planned removal of blood from a person's body for the purpose of treating a medical condition. The procedure is similar to donating blood. Usually, about a pint (470 mL, or 0.47 L) of blood is removed. The average adult has 9-12 pints (4.3-5.7 L) of blood in the body. Therapeutic phlebotomy may be used to treat the following medical conditions:  Hemochromatosis. This is a condition in which the blood contains too much iron.  Polycythemia vera. This is a condition in which the blood contains too many red blood cells.  Porphyria cutanea tarda. This is a disease in which an important part of hemoglobin is not made properly. It results in the buildup of abnormal amounts of porphyrins in the body.  Sickle cell disease. This is a condition in which the red blood cells form an abnormal crescent shape rather than a round shape. Tell a health care provider about:  Any allergies you have.  All medicines you are taking, including vitamins, herbs, eye drops, creams, and over-the-counter medicines.  Any problems you or family members have had with anesthetic medicines.  Any blood disorders you have.  Any surgeries you have had.  Any medical conditions you have.  Whether you are pregnant or may be pregnant. What are the risks? Generally, this is a safe procedure. However, problems may occur, including:  Nausea or light-headedness.  Low blood pressure (hypotension).  Soreness, bleeding, swelling, or bruising at the needle insertion site.  Infection. What happens before the procedure?  Follow instructions from your health care provider about eating or drinking restrictions.  Ask your health care provider about: ? Changing or stopping your regular medicines. This is especially important if you are taking diabetes medicines or blood thinners (anticoagulants). ? Taking medicines such as aspirin and ibuprofen. These medicines can thin your  blood. Do not take these medicines unless your health care provider tells you to take them. ? Taking over-the-counter medicines, vitamins, herbs, and supplements.  Wear clothing with sleeves that can be raised above the elbow.  Plan to have someone take you home from the hospital or clinic.  You may have a blood sample taken.  Your blood pressure, pulse rate, and breathing rate will be measured. What happens during the procedure?   To lower your risk of infection: ? Your health care team will wash or sanitize their hands. ? Your skin will be cleaned with an antiseptic.  You may be given a medicine to numb the area (local anesthetic).  A tourniquet will be placed on your arm.  A needle will be inserted into one of your veins.  Tubing and a collection bag will be attached to that needle.  Blood will flow through the needle and tubing into the collection bag.  The collection bag will be placed lower than your arm to allow gravity to help the flow of blood into the bag.  You may be asked to open and close your hand slowly and continually during the entire collection.  After the specified amount of blood has been removed from your body, the collection bag and tubing will be clamped.  The needle will be removed from your vein.  Pressure will be held on the site of the needle insertion to stop the bleeding.  A bandage (dressing) will be placed over the needle insertion site. The procedure may vary among health care providers and hospitals. What happens after the procedure?  Your blood pressure, pulse rate, and breathing rate will be   measured after the procedure.  You will be encouraged to drink fluids.  Your recovery will be assessed and monitored.  You can return to your normal activities as told by your health care provider. Summary  Therapeutic phlebotomy is the planned removal of blood from a person's body for the purpose of treating a medical condition.  Therapeutic  phlebotomy may be used to treat hemochromatosis, polycythemia vera, porphyria cutanea tarda, or sickle cell disease.  In the procedure, a needle is inserted and about a pint (470 mL, or 0.47 L) of blood is removed. The average adult has 9-12 pints (4.3-5.7 L) of blood in the body.  This is generally a safe procedure, but it can sometimes cause problems such as nausea, light-headedness, or low blood pressure (hypotension). This information is not intended to replace advice given to you by your health care provider. Make sure you discuss any questions you have with your health care provider. Document Released: 06/16/2010 Document Revised: 01/28/2017 Document Reviewed: 01/28/2017 Elsevier Patient Education  2020 [employer]  

## 2018-11-11 NOTE — Progress Notes (Signed)
Stuart Hill presents today for phlebotomy per MD orders. Phlebotomy procedure started at 1110 and ended at 1120. 512 grams removed. Patient observed for 30 minutes after procedure without any incident. Patient tolerated procedure well. IV needle removed intact.

## 2018-11-14 LAB — IRON AND TIBC
Iron: 94 ug/dL (ref 42–163)
Saturation Ratios: 22 % (ref 20–55)
TIBC: 428 ug/dL — ABNORMAL HIGH (ref 202–409)
UIBC: 334 ug/dL (ref 117–376)

## 2018-11-14 LAB — LACTATE DEHYDROGENASE: LDH: 177 U/L (ref 98–192)

## 2018-11-14 LAB — FERRITIN: Ferritin: 12 ng/mL — ABNORMAL LOW (ref 24–336)

## 2018-12-13 ENCOUNTER — Inpatient Hospital Stay: Payer: Self-pay | Attending: Family

## 2018-12-13 ENCOUNTER — Inpatient Hospital Stay: Payer: Self-pay

## 2018-12-13 ENCOUNTER — Other Ambulatory Visit: Payer: Self-pay

## 2018-12-13 VITALS — BP 143/85 | HR 58 | Temp 98.0°F | Resp 18

## 2018-12-13 DIAGNOSIS — D500 Iron deficiency anemia secondary to blood loss (chronic): Secondary | ICD-10-CM

## 2018-12-13 DIAGNOSIS — D751 Secondary polycythemia: Secondary | ICD-10-CM | POA: Insufficient documentation

## 2018-12-13 LAB — CMP (CANCER CENTER ONLY)
ALT: 21 U/L (ref 0–44)
AST: 22 U/L (ref 15–41)
Albumin: 4.7 g/dL (ref 3.5–5.0)
Alkaline Phosphatase: 49 U/L (ref 38–126)
Anion gap: 5 (ref 5–15)
BUN: 13 mg/dL (ref 6–20)
CO2: 31 mmol/L (ref 22–32)
Calcium: 9.4 mg/dL (ref 8.9–10.3)
Chloride: 104 mmol/L (ref 98–111)
Creatinine: 0.92 mg/dL (ref 0.61–1.24)
GFR, Est AFR Am: >60 mL/min (ref >60)
GFR, Estimated: >60 mL/min (ref >60)
Glucose, Bld: 100 mg/dL — ABNORMAL HIGH (ref 70–99)
Potassium: 4.5 mmol/L (ref 3.5–5.1)
Sodium: 140 mmol/L (ref 135–145)
Total Bilirubin: 0.4 mg/dL (ref 0.3–1.2)
Total Protein: 7 g/dL (ref 6.5–8.1)

## 2018-12-13 LAB — CBC WITH DIFFERENTIAL (CANCER CENTER ONLY)
Abs Immature Granulocytes: 0.13 10*3/uL — ABNORMAL HIGH (ref 0.00–0.07)
Basophils Absolute: 0.1 10*3/uL (ref 0.0–0.1)
Basophils Relative: 1 %
Eosinophils Absolute: 0.1 10*3/uL (ref 0.0–0.5)
Eosinophils Relative: 1 %
HCT: 45.3 % (ref 39.0–52.0)
Hemoglobin: 14.8 g/dL (ref 13.0–17.0)
Immature Granulocytes: 1 %
Lymphocytes Relative: 13 %
Lymphs Abs: 1.7 10*3/uL (ref 0.7–4.0)
MCH: 29.8 pg (ref 26.0–34.0)
MCHC: 32.7 g/dL (ref 30.0–36.0)
MCV: 91.3 fL (ref 80.0–100.0)
Monocytes Absolute: 0.9 10*3/uL (ref 0.1–1.0)
Monocytes Relative: 7 %
Neutro Abs: 9.9 10*3/uL — ABNORMAL HIGH (ref 1.7–7.7)
Neutrophils Relative %: 77 %
Platelet Count: 303 10*3/uL (ref 150–400)
RBC: 4.96 MIL/uL (ref 4.22–5.81)
RDW: 13.2 % (ref 11.5–15.5)
WBC Count: 12.8 10*3/uL — ABNORMAL HIGH (ref 4.0–10.5)
nRBC: 0 % (ref 0.0–0.2)

## 2018-12-13 NOTE — Progress Notes (Signed)
Stuart Hill presents today for phlebotomy per MD orders. Phlebotomy procedure started at 1140 and ended at 1255pm. 520 grams removed. Patient observed for 30 minutes after procedure without any incident. Patient tolerated procedure well. IV needle removed intact.

## 2018-12-13 NOTE — Patient Instructions (Signed)
Therapeutic Phlebotomy Therapeutic phlebotomy is the planned removal of blood from a person's body for the purpose of treating a medical condition. The procedure is similar to donating blood. Usually, about a pint (470 mL, or 0.47 L) of blood is removed. The average adult has 9-12 pints (4.3-5.7 L) of blood in the body. Therapeutic phlebotomy may be used to treat the following medical conditions:  Hemochromatosis. This is a condition in which the blood contains too much iron.  Polycythemia vera. This is a condition in which the blood contains too many red blood cells.  Porphyria cutanea tarda. This is a disease in which an important part of hemoglobin is not made properly. It results in the buildup of abnormal amounts of porphyrins in the body.  Sickle cell disease. This is a condition in which the red blood cells form an abnormal crescent shape rather than a round shape. Tell a health care provider about:  Any allergies you have.  All medicines you are taking, including vitamins, herbs, eye drops, creams, and over-the-counter medicines.  Any problems you or family members have had with anesthetic medicines.  Any blood disorders you have.  Any surgeries you have had.  Any medical conditions you have.  Whether you are pregnant or may be pregnant. What are the risks? Generally, this is a safe procedure. However, problems may occur, including:  Nausea or light-headedness.  Low blood pressure (hypotension).  Soreness, bleeding, swelling, or bruising at the needle insertion site.  Infection. What happens before the procedure?  Follow instructions from your health care provider about eating or drinking restrictions.  Ask your health care provider about: ? Changing or stopping your regular medicines. This is especially important if you are taking diabetes medicines or blood thinners (anticoagulants). ? Taking medicines such as aspirin and ibuprofen. These medicines can thin your  blood. Do not take these medicines unless your health care provider tells you to take them. ? Taking over-the-counter medicines, vitamins, herbs, and supplements.  Wear clothing with sleeves that can be raised above the elbow.  Plan to have someone take you home from the hospital or clinic.  You may have a blood sample taken.  Your blood pressure, pulse rate, and breathing rate will be measured. What happens during the procedure?   To lower your risk of infection: ? Your health care team will wash or sanitize their hands. ? Your skin will be cleaned with an antiseptic.  You may be given a medicine to numb the area (local anesthetic).  A tourniquet will be placed on your arm.  A needle will be inserted into one of your veins.  Tubing and a collection bag will be attached to that needle.  Blood will flow through the needle and tubing into the collection bag.  The collection bag will be placed lower than your arm to allow gravity to help the flow of blood into the bag.  You may be asked to open and close your hand slowly and continually during the entire collection.  After the specified amount of blood has been removed from your body, the collection bag and tubing will be clamped.  The needle will be removed from your vein.  Pressure will be held on the site of the needle insertion to stop the bleeding.  A bandage (dressing) will be placed over the needle insertion site. The procedure may vary among health care providers and hospitals. What happens after the procedure?  Your blood pressure, pulse rate, and breathing rate will be   measured after the procedure.  You will be encouraged to drink fluids.  Your recovery will be assessed and monitored.  You can return to your normal activities as told by your health care provider. Summary  Therapeutic phlebotomy is the planned removal of blood from a person's body for the purpose of treating a medical condition.  Therapeutic  phlebotomy may be used to treat hemochromatosis, polycythemia vera, porphyria cutanea tarda, or sickle cell disease.  In the procedure, a needle is inserted and about a pint (470 mL, or 0.47 L) of blood is removed. The average adult has 9-12 pints (4.3-5.7 L) of blood in the body.  This is generally a safe procedure, but it can sometimes cause problems such as nausea, light-headedness, or low blood pressure (hypotension). This information is not intended to replace advice given to you by your health care provider. Make sure you discuss any questions you have with your health care provider. Document Released: 06/16/2010 Document Revised: 01/28/2017 Document Reviewed: 01/28/2017 Elsevier Patient Education  2020 [employer]  

## 2018-12-14 LAB — IRON AND TIBC
Iron: 36 ug/dL — ABNORMAL LOW (ref 42–163)
Saturation Ratios: 7 % — ABNORMAL LOW (ref 20–55)
TIBC: 495 ug/dL — ABNORMAL HIGH (ref 202–409)
UIBC: 460 ug/dL — ABNORMAL HIGH (ref 117–376)

## 2018-12-14 LAB — FERRITIN: Ferritin: 11 ng/mL — ABNORMAL LOW (ref 24–336)

## 2019-01-09 ENCOUNTER — Other Ambulatory Visit: Payer: Self-pay | Admitting: Family Medicine

## 2019-01-09 DIAGNOSIS — F411 Generalized anxiety disorder: Secondary | ICD-10-CM

## 2019-01-12 ENCOUNTER — Encounter: Payer: Self-pay | Admitting: Hematology & Oncology

## 2019-01-12 ENCOUNTER — Inpatient Hospital Stay: Payer: Self-pay | Attending: Family

## 2019-01-12 ENCOUNTER — Other Ambulatory Visit: Payer: Self-pay

## 2019-01-12 ENCOUNTER — Inpatient Hospital Stay: Payer: Self-pay

## 2019-01-12 ENCOUNTER — Telehealth: Payer: Self-pay | Admitting: Hematology & Oncology

## 2019-01-12 ENCOUNTER — Inpatient Hospital Stay (HOSPITAL_BASED_OUTPATIENT_CLINIC_OR_DEPARTMENT_OTHER): Payer: Self-pay | Admitting: Hematology & Oncology

## 2019-01-12 VITALS — BP 137/83 | HR 69 | Temp 98.6°F | Resp 17

## 2019-01-12 DIAGNOSIS — D45 Polycythemia vera: Secondary | ICD-10-CM

## 2019-01-12 DIAGNOSIS — D500 Iron deficiency anemia secondary to blood loss (chronic): Secondary | ICD-10-CM

## 2019-01-12 DIAGNOSIS — D751 Secondary polycythemia: Secondary | ICD-10-CM | POA: Insufficient documentation

## 2019-01-12 LAB — CBC WITH DIFFERENTIAL (CANCER CENTER ONLY)
Abs Immature Granulocytes: 0.02 10*3/uL (ref 0.00–0.07)
Basophils Absolute: 0.1 10*3/uL (ref 0.0–0.1)
Basophils Relative: 1 %
Eosinophils Absolute: 0.2 10*3/uL (ref 0.0–0.5)
Eosinophils Relative: 2 %
HCT: 43.3 % (ref 39.0–52.0)
Hemoglobin: 14.4 g/dL (ref 13.0–17.0)
Immature Granulocytes: 0 %
Lymphocytes Relative: 23 %
Lymphs Abs: 1.8 10*3/uL (ref 0.7–4.0)
MCH: 29.2 pg (ref 26.0–34.0)
MCHC: 33.3 g/dL (ref 30.0–36.0)
MCV: 87.8 fL (ref 80.0–100.0)
Monocytes Absolute: 0.8 10*3/uL (ref 0.1–1.0)
Monocytes Relative: 11 %
Neutro Abs: 4.8 10*3/uL (ref 1.7–7.7)
Neutrophils Relative %: 63 %
Platelet Count: 326 10*3/uL (ref 150–400)
RBC: 4.93 MIL/uL (ref 4.22–5.81)
RDW: 13.5 % (ref 11.5–15.5)
WBC Count: 7.7 10*3/uL (ref 4.0–10.5)
nRBC: 0 % (ref 0.0–0.2)

## 2019-01-12 LAB — CMP (CANCER CENTER ONLY)
ALT: 27 U/L (ref 0–44)
AST: 21 U/L (ref 15–41)
Albumin: 4.6 g/dL (ref 3.5–5.0)
Alkaline Phosphatase: 33 U/L — ABNORMAL LOW (ref 38–126)
Anion gap: 6 (ref 5–15)
BUN: 15 mg/dL (ref 6–20)
CO2: 28 mmol/L (ref 22–32)
Calcium: 8.8 mg/dL — ABNORMAL LOW (ref 8.9–10.3)
Chloride: 106 mmol/L (ref 98–111)
Creatinine: 0.82 mg/dL (ref 0.61–1.24)
GFR, Est AFR Am: >60 mL/min (ref >60)
GFR, Estimated: >60 mL/min (ref >60)
Glucose, Bld: 100 mg/dL — ABNORMAL HIGH (ref 70–99)
Potassium: 4.2 mmol/L (ref 3.5–5.1)
Sodium: 140 mmol/L (ref 135–145)
Total Bilirubin: 0.3 mg/dL (ref 0.3–1.2)
Total Protein: 7.3 g/dL (ref 6.5–8.1)

## 2019-01-12 LAB — IRON AND TIBC
Iron: 51 ug/dL (ref 42–163)
Saturation Ratios: 10 % — ABNORMAL LOW (ref 20–55)
TIBC: 533 ug/dL — ABNORMAL HIGH (ref 202–409)
UIBC: 481 ug/dL — ABNORMAL HIGH (ref 117–376)

## 2019-01-12 LAB — LACTATE DEHYDROGENASE: LDH: 169 U/L (ref 98–192)

## 2019-01-12 LAB — FERRITIN: Ferritin: 8 ng/mL — ABNORMAL LOW (ref 24–336)

## 2019-01-12 NOTE — Progress Notes (Signed)
Pt requested to leave, unable to stay x40min. VSS.

## 2019-01-12 NOTE — Progress Notes (Signed)
Hematology and Oncology Follow Up Visit  MATTHEWS GARNIER [id number] 1970-11-29 48 y.o. 01/12/2019   Principle Diagnosis:  Secondary polycythemia JAK2 (-), smoker  LEFT Bell's palsy  Current Therapy:   Phlebotomy to maintain hematocrit below 42% Plavix 75 mg po q day- stoppedhimself and does not want to restart   Interim History:  Mr. Vanweelden is here today for follow-up.  He is doing quite well.  I actually see him in the gym on the weekends when he is working out.  He seems to be doing pretty well with his workouts.  He is still working quite a lot.  Unfortunately, work has caused arthritis for him.  He is on Mobic.  This appears to be causing him some issues.  He is going to see his family doctor regarding this.  He has had no problems with headache.  He has had no rashes.  He has had no nausea or vomiting.  He has had no change in bowel or bladder habits.  He says on occasion, he gets numbness in his arms.  I suspect this probably is neurologic he probably has some bone spurs or spinal stenosis in the neck.  He has had no obvious change in bowel or bladder habits.  He does have some occasional urinary retention.  I suspect that he may have some prostate issues although he is only 48 years old.  He is thinking about going onto testosterone.  I am not sure if this is a great idea for him given his hematologic problems and the potential for cerebrovascular disease from hypercoagulability.  Currently, his performance status is ECOG 0.     Medications:  Allergies as of 01/12/2019      Reactions   Asa [aspirin] Hives   Plavix [clopidogrel Bisulfate] Other (See Comments)   Confusion, lethargy, slurred speech   Acetaminophen Hives   Afrin [nasal Spray] Hives   Antihistamines, Chlorpheniramine-type Hives   Benadryl [diphenhydramine Hcl] Hives   Buprenorphine Hcl Hives   Carisoprodol Hives   Codeine Hives   Metoclopramide Hives   Morphine And Related Hives   Oxymetazoline  Hives   Ibuprofen Hives   Pheniramine Hives, Rash      Medication List       Accurate as of January 12, 2019  9:48 AM. If you have any questions, ask your nurse or doctor.        ALPRAZolam 1 MG tablet Commonly known as: XANAX TAKE 1 TABLET BY MOUTH THREE TIMES A DAY AS NEEDED FOR ANXIETY   meloxicam 15 MG tablet Commonly known as: MOBIC Take 1/2 or 1 daily -Use as needed for joint pain   sildenafil 20 MG tablet Commonly known as: REVATIO TAKE 1-3 TABLETS BY MOUTH PRIOR TO SEXUAL ACTIVITY AS NEEDED       Allergies:  Allergies  Allergen Reactions  . Asa [Aspirin] Hives  . Plavix [Clopidogrel Bisulfate] Other (See Comments)    Confusion, lethargy, slurred speech  . Acetaminophen Hives  . Afrin [Nasal Spray] Hives  . Antihistamines, Chlorpheniramine-Type Hives  . Benadryl [Diphenhydramine Hcl] Hives  . Buprenorphine Hcl Hives  . Carisoprodol Hives  . Codeine Hives  . Metoclopramide Hives  . Morphine And Related Hives  . Oxymetazoline Hives  . Ibuprofen Hives  . Pheniramine Hives and Rash    Past Medical History, Surgical history, Social history, and Family History were reviewed and updated.  Review of Systems: Review of Systems  Constitutional: Negative.   HENT: Negative.   Eyes: Negative.  Respiratory: Negative.   Cardiovascular: Negative.   Gastrointestinal: Negative.   Genitourinary: Negative.   Musculoskeletal: Negative.   Skin: Negative.   Neurological: Negative.   Endo/Heme/Allergies: Negative.   Psychiatric/Behavioral: Negative.       Physical Exam:  vitals were not taken for this visit.   Wt Readings from Last 3 Encounters:  10/12/18 192 lb (87.1 kg)  09/19/18 189 lb (85.7 kg)  09/01/18 190 lb 1.9 oz (86.2 kg)    Physical Exam Vitals reviewed.  HENT:     Head: Normocephalic and atraumatic.  Eyes:     Pupils: Pupils are equal, round, and reactive to light.  Cardiovascular:     Rate and Rhythm: Normal rate and regular rhythm.      Heart sounds: Normal heart sounds.  Pulmonary:     Effort: Pulmonary effort is normal.     Breath sounds: Normal breath sounds.  Abdominal:     General: Bowel sounds are normal.     Palpations: Abdomen is soft.  Musculoskeletal:        General: No tenderness or deformity. Normal range of motion.     Cervical back: Normal range of motion.  Lymphadenopathy:     Cervical: No cervical adenopathy.  Skin:    General: Skin is warm and dry.     Findings: No erythema or rash.  Neurological:     Mental Status: He is alert and oriented to person, place, and time.  Psychiatric:        Behavior: Behavior normal.        Thought Content: Thought content normal.        Judgment: Judgment normal.      Lab Results  Component Value Date   WBC 7.7 01/12/2019   HGB 14.4 01/12/2019   HCT 43.3 01/12/2019   MCV 87.8 01/12/2019   PLT 326 01/12/2019   Lab Results  Component Value Date   FERRITIN 11 (L) 12/13/2018   IRON 36 (L) 12/13/2018   TIBC 495 (H) 12/13/2018   UIBC 460 (H) 12/13/2018   IRONPCTSAT 7 (L) 12/13/2018   Lab Results  Component Value Date   RETICCTPCT 1.0 12/20/2017   RBC 4.93 01/12/2019   No results found for: KPAFRELGTCHN, LAMBDASER, KAPLAMBRATIO No results found for: IGGSERUM, IGA, IGMSERUM No results found for: Kathrynn Ducking, MSPIKE, SPEI   Chemistry      Component Value Date/Time   NA 140 12/13/2018 1057   NA 143 01/01/2017 1453   NA 139 11/12/2015 0945   K 4.5 12/13/2018 1057   K 4.3 01/01/2017 1453   K 3.7 11/12/2015 0945   CL 104 12/13/2018 1057   CL 103 01/01/2017 1453   CO2 31 12/13/2018 1057   CO2 27 01/01/2017 1453   CO2 22 11/12/2015 0945   BUN 13 12/13/2018 1057   BUN 18 01/01/2017 1453   BUN 13.1 11/12/2015 0945   CREATININE 0.92 12/13/2018 1057   CREATININE 1.0 01/01/2017 1453   CREATININE 0.9 11/12/2015 0945      Component Value Date/Time   CALCIUM 9.4 12/13/2018 1057   CALCIUM 8.8 01/01/2017  1453   CALCIUM 8.9 11/12/2015 0945   ALKPHOS 49 12/13/2018 1057   ALKPHOS 44 01/01/2017 1453   ALKPHOS 66 11/12/2015 0945   AST 22 12/13/2018 1057   AST 13 11/12/2015 0945   ALT 21 12/13/2018 1057   ALT 368 (HH) 01/01/2017 1453   ALT 14 11/12/2015 0945   BILITOT 0.4 12/13/2018 1057  BILITOT <0.22 11/12/2015 0945      Impression and Plan: Mr. Viana is a very pleasant 48 yo white male with history of polycythemia vera.  I realize that he is Jak 2-.  However, he has a very low erythropoietin level.  We will go ahead and phlebotomize him today.  I think this would certainly be a good idea.  We really want to be aggressive with keeping his hematocrit below 42%.  We will plan to get him back in another 4 weeks or so.Marland Kitchen     Volanda Napoleon, MD 12/17/20209:48 AM

## 2019-01-12 NOTE — Telephone Encounter (Signed)
Called LMVM for patient with updated appointments per 12/17 los

## 2019-01-12 NOTE — Patient Instructions (Signed)
Therapeutic Phlebotomy Therapeutic phlebotomy is the planned removal of blood from a person's body for the purpose of treating a medical condition. The procedure is similar to donating blood. Usually, about a pint (470 mL, or 0.47 L) of blood is removed. The average adult has 9-12 pints (4.3-5.7 L) of blood in the body. Therapeutic phlebotomy may be used to treat the following medical conditions:  Hemochromatosis. This is a condition in which the blood contains too much iron.  Polycythemia vera. This is a condition in which the blood contains too many red blood cells.  Porphyria cutanea tarda. This is a disease in which an important part of hemoglobin is not made properly. It results in the buildup of abnormal amounts of porphyrins in the body.  Sickle cell disease. This is a condition in which the red blood cells form an abnormal crescent shape rather than a round shape. Tell a health care provider about:  Any allergies you have.  All medicines you are taking, including vitamins, herbs, eye drops, creams, and over-the-counter medicines.  Any problems you or family members have had with anesthetic medicines.  Any blood disorders you have.  Any surgeries you have had.  Any medical conditions you have.  Whether you are pregnant or may be pregnant. What are the risks? Generally, this is a safe procedure. However, problems may occur, including:  Nausea or light-headedness.  Low blood pressure (hypotension).  Soreness, bleeding, swelling, or bruising at the needle insertion site.  Infection. What happens before the procedure?  Follow instructions from your health care provider about eating or drinking restrictions.  Ask your health care provider about: ? Changing or stopping your regular medicines. This is especially important if you are taking diabetes medicines or blood thinners (anticoagulants). ? Taking medicines such as aspirin and ibuprofen. These medicines can thin your  blood. Do not take these medicines unless your health care provider tells you to take them. ? Taking over-the-counter medicines, vitamins, herbs, and supplements.  Wear clothing with sleeves that can be raised above the elbow.  Plan to have someone take you home from the hospital or clinic.  You may have a blood sample taken.  Your blood pressure, pulse rate, and breathing rate will be measured. What happens during the procedure?   To lower your risk of infection: ? Your health care team will wash or sanitize their hands. ? Your skin will be cleaned with an antiseptic.  You may be given a medicine to numb the area (local anesthetic).  A tourniquet will be placed on your arm.  A needle will be inserted into one of your veins.  Tubing and a collection bag will be attached to that needle.  Blood will flow through the needle and tubing into the collection bag.  The collection bag will be placed lower than your arm to allow gravity to help the flow of blood into the bag.  You may be asked to open and close your hand slowly and continually during the entire collection.  After the specified amount of blood has been removed from your body, the collection bag and tubing will be clamped.  The needle will be removed from your vein.  Pressure will be held on the site of the needle insertion to stop the bleeding.  A bandage (dressing) will be placed over the needle insertion site. The procedure may vary among health care providers and hospitals. What happens after the procedure?  Your blood pressure, pulse rate, and breathing rate will be   measured after the procedure.  You will be encouraged to drink fluids.  Your recovery will be assessed and monitored.  You can return to your normal activities as told by your health care provider. Summary  Therapeutic phlebotomy is the planned removal of blood from a person's body for the purpose of treating a medical condition.  Therapeutic  phlebotomy may be used to treat hemochromatosis, polycythemia vera, porphyria cutanea tarda, or sickle cell disease.  In the procedure, a needle is inserted and about a pint (470 mL, or 0.47 L) of blood is removed. The average adult has 9-12 pints (4.3-5.7 L) of blood in the body.  This is generally a safe procedure, but it can sometimes cause problems such as nausea, light-headedness, or low blood pressure (hypotension). This information is not intended to replace advice given to you by your health care provider. Make sure you discuss any questions you have with your health care provider. Document Released: 06/16/2010 Document Revised: 01/28/2017 Document Reviewed: 01/28/2017 Elsevier Patient Education  2020 [employer]  

## 2019-02-12 NOTE — Patient Instructions (Addendum)
It was good to see you again today I will set you up to see Dr Mardelle Matte about your shoulder pain If you like, try wellbutrin for your fatigue and difficulty with concentration- take it once daily for one week, then go to twice a day  I refilled your meloxicam and tramadol to use as needed for pain  The nodule on your scrotum appears to be a blocked oil duct- try warm compresses and then gently squeezing the area.    Take care

## 2019-02-12 NOTE — Progress Notes (Signed)
[facility] [place], [place], [place] [address] [phone]  Date:  02/13/2019   Name:  Stuart Hill   DOB:  May 29, 1970   MRN:  [id number]  PCP:  Darreld Mclean, MD    Chief Complaint: Muscle Pain (Left shoulder pain, constant pain) and Foot Numbness (bilateral )   History of Present Illness:  Stuart Hill is a 49 y.o. very pleasant male patient who presents with the following:  Patient with history of anxiety, sleep apnea, generalized anxiety disorder, secondary polycythemia Here today for a periodic follow-up visit Last seen by myself in August  Most recent visit with oncology for polycythemia this morning--they did therapeutic phlebotomy, goal HCT under 42%  CMP, CBC, iron panel done at that time  Tetanus vaccine-declines Flu shot- pt declines   He notes pain with his left shoulder and lower back- I referred him to ortho in the past for this but he did not go  He would like a referral now He did have an MRI of his lumbar spine in 2018 which was virtually normal He notes that he is now able to take aspirin, tylenol, ibuprofen, tramadol-previously he had listed allergies for all of these medications 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  He has to prop himself with pillows in a certain way to immobilize his shoulders so he can sleep at night He tried some gabapentin belonging to his wife in the past but did not like it   He notes that tramadol seemed to help him in the past-he took it after his tonsillectomy  He notes a nodule of some sort on his scrotum It has been present for about 6 months Feels hard - he has tried to squeeze it but nothing came out Not generally tender No other abnormality noted  He is taking xanax 1mg  TID- this is working pretty well for him He notes that he is feeling really tired lately, he can sleep 11 to 12 hours and then still  feel tired He wonders if this may be due to low testosterone.  This was checked by oncology this am- still pending He notes difficulty paying attention, and wonders if he may be depressed  02/08/2019  2   01/09/2019  Alprazolam 1 MG Tablet  90.00  30 Je Cop   [id number]   [place] (1204)   1  6.00 LME  Private Pay   [place]  01/10/2019  2   01/09/2019  Alprazolam 1 MG Tablet  90.00  30 Je Cop   [id number]   [place] (1204)   0  6.00 LME  Private Pay   [place]  12/14/2018  2   09/19/2018  Alprazolam 1 MG Tablet  90.00  30 Je Cop   [id number]   [place] (1204)   3  6.00 LME  Private Pay   [place]  11/14/2018  2   09/19/2018  Alprazolam 1 MG Tablet  90.00  30 Je Cop   [id number]   [place] (1204)   2  6.00 LME  Private Pay   [place]  10/18/2018  2   09/19/2018  Alprazolam 1 MG Tablet  90.00  30 Je Cop   [id number]   [place] (1204)   1  6.00 LME  Private Pay   [place]  09/20/2018  2   09/19/2018  Alprazolam 1 MG Tablet  90.00  30 Je Cop  [id number]   [place] (1204)   0  6.00 LME  Private Pay   [place]  09/12/2018  2   09/12/2018  Alprazolam 1 MG Tablet  30.00  10 Je Cop   [id number]   [place] (1204)   0  6.00 LME  Private Pay   [place]  08/12/2018  2   08/09/2018  Alprazolam 1 MG Tablet  90.00  30 Je Cop   [id number]   [place] (1204)   0  6.00 LME  Private Pay   [place]  07/16/2018  2   05/16/2018  Alprazolam 1 MG Tablet  90.00  30 Je Cop   [id number]   [place] (1204)   2  6.00 LME  Private Pay   [place]  06/14/2018  2   05/16/2018  Alprazolam 1 MG Tablet  90.00  30 Je Cop   [id number]   [place] (1204)   1  6.00 LME  Private Pay   [place]  05/25/2018  2   05/25/2018  Tramadol Hcl 50 MG Tablet  15.00  [address] [place]   [id number]   [place] (1204)   0  15.00 MME  Private Pay   [place]  05/16/2018  2   05/16/2018  Alprazolam 1 MG Tablet  90.00  30 Je Cop   [id number]   [place] (1204)   0  6.00 LME  Private Pay     04/19/2018  2   02/23/2018  Alprazolam 1 MG Tablet  90.00  30 Je Cop   [id number]   [place] (1204)   2  6.00 LME        Patient Active Problem List   Diagnosis Date Noted  . Tonsillar hypertrophy 04/30/2017  . Obstructive sleep  apnea 04/23/2017  . 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 Polycythemia vera ([place]) 05/03/2015  . Hemoptysis 04/03/2015  . Condylomata acuminata 01/18/2015  . Generalized anxiety disorder 03/05/2014    Past Medical History:  Diagnosis Date  . Allergy   . Anxiety   . Arthritis   . Bell's palsy   . Erythrocytosis 04/10/2015  . Hyperlipidemia   . Polycythemia vera ([place]) 05/03/2015    Past Surgical History:  Procedure Laterality Date  . DENTAL SURGERY    . HAND SURGERY    . TONSILLECTOMY Bilateral 04/30/2017   Procedure: TONSILLECTOMY;  Surgeon: Stuart Belfast, MD;  Location: [facility];  Service: ENT;  Laterality: Bilateral;    Social History   Tobacco Use  . Smoking status: Current Every Day Smoker    Packs/day: 1.00    Years: 24.00    Pack years: 24.00    Types: Cigarettes  . Smokeless tobacco: Never Used  Substance Use Topics  . Alcohol use: Yes    Alcohol/week: 1.0 standard drinks    Types: 1 Cans of beer per week    Comment: 6-10 beer a week  . Drug use: Yes    Types: Other-see comments, Marijuana    Comment: Marijuana every other day.    Family History  Problem Relation Age of Onset  . COPD Mother   . Fibromyalgia Mother   . Heart disease Father   . Stroke Father   . Depression Brother   . Heart disease Brother   . Heart disease Maternal Grandmother   . Stroke Maternal Grandmother   . Heart disease Maternal Grandfather   . Stroke Maternal Grandfather   . Heart disease Paternal Grandmother   . Stroke Paternal Grandmother   . Heart disease Paternal Grandfather   .  Stroke Paternal Grandfather   . Stroke Paternal Aunt     Allergies  Allergen Reactions  . Asa [Aspirin] Hives  . Plavix [Clopidogrel Bisulfate] Other (See Comments)    Confusion, lethargy, slurred speech  . Acetaminophen Hives  . Afrin [Nasal Spray] Hives  . Antihistamines, Chlorpheniramine-Type Hives  . Benadryl [Diphenhydramine Hcl] Hives  . Buprenorphine Hcl Hives  .  Carisoprodol Hives  . Codeine Hives  . Metoclopramide Hives  . Morphine And Related Hives  . Oxymetazoline Hives  . Ibuprofen Hives  . Pheniramine Hives and Rash    Medication list has been reviewed and updated.  Current Outpatient Medications on File Prior to Visit  Medication Sig Dispense Refill  . ALPRAZolam (XANAX) 1 MG tablet TAKE 1 TABLET BY MOUTH THREE TIMES A DAY AS NEEDED FOR ANXIETY 90 tablet 3  . meloxicam (MOBIC) 15 MG tablet Take 1/2 or 1 daily -Use as needed for joint pain 30 tablet 3  . sildenafil (REVATIO) 20 MG tablet TAKE 1-3 TABLETS BY MOUTH PRIOR TO SEXUAL ACTIVITY AS NEEDED 30 tablet 5   No current facility-administered medications on file prior to visit.    Review of Systems:  As per HPI- otherwise negative.   Physical Examination: Vitals:   02/13/19 1258  BP: 118/82  Pulse: 71  Resp: 17  Temp: 97.8 F (36.6 C)  SpO2: 98%   Vitals:   02/13/19 1258  Weight: 185 lb (83.9 kg)  Height: 6\' 1"  (1.854 m)   Body mass index is 24.41 kg/m. Ideal Body Weight: Weight in (lb) to have BMI = 25: 189.1  GEN: WDWN, NAD, Non-toxic, A & O x 3, tall build, normal weight.  Looks well HEENT: Atraumatic, Normocephalic. Neck supple. No masses, No LAD. Ears and Nose: No external deformity. CV: RRR, No M/G/R. No JVD. No thrill. No extra heart sounds. PULM: CTA B, no wheezes, crackles, rhonchi. No retractions. No resp. distress. No accessory muscle use. ABD: S, NT, ND, +BS. No rebound. No HSM. EXTR: No c/c/e NEURO Normal gait.  PSYCH: Normally interactive. Conversant. Not depressed or anxious appearing.  Calm demeanor.  He has what appears to be a blocked sebaceous duct of the scrotum, a small milia is present. Left shoulder displays loss of about 15 degrees of range of motion, pain with limits of internal and external rotation Back exam appears normal, no tenderness to palpation.  Normal thoracolumbar flexion and extension.  Normal bilateral leg strength, sensation,  DTR.  Negative bilateral straight leg raise  Assessment and Plan: Generalized anxiety disorder  Chronic left shoulder pain - Plan: meloxicam (MOBIC) 7.5 MG tablet, traMADol (ULTRAM) 50 MG tablet, Ambulatory referral to Orthopedic Surgery  Concentration deficit - Plan: buPROPion (WELLBUTRIN SR) 150 MG 12 hr tablet  Scrotum swelling  Chronic bilateral low back pain without sciatica  Chronic fatigue  Jonus is here today with a few concerns I reassured him that the spot in his scrotum appears to be a blocked oil duct, suggested warm compresses and hopefully this will resolve.  Let me know if it does not go away Chronic left shoulder pain-suspect he needs physical therapy, may also benefit from a steroid injection Will refer to orthopedics, prescribed meloxicam. He may also use tramadol on occasion for more severe pain Updated allergy list-per patient he is now able to take NSAIDs, aspirin, Tylenol, and tramadol without any reaction  Sari notes fatigue and amotivation, he may struggle with some depression and difficulty concentrating We will have him try Wellbutrin, he  will let me know how this works for him  Moderate medical decision making  This visit occurred during the SARS-CoV-2 public health emergency.  Safety protocols were in place, including screening questions prior to the visit, additional usage of staff PPE, and extensive cleaning of exam room while observing appropriate contact time as indicated for disinfecting solutions.     Signed Lamar Blinks, MD

## 2019-02-13 ENCOUNTER — Other Ambulatory Visit: Payer: Self-pay

## 2019-02-13 ENCOUNTER — Encounter: Payer: Self-pay | Admitting: Family Medicine

## 2019-02-13 ENCOUNTER — Ambulatory Visit (INDEPENDENT_AMBULATORY_CARE_PROVIDER_SITE_OTHER): Payer: Self-pay | Admitting: Family Medicine

## 2019-02-13 ENCOUNTER — Telehealth: Payer: Self-pay | Admitting: Family

## 2019-02-13 ENCOUNTER — Inpatient Hospital Stay: Payer: Self-pay

## 2019-02-13 ENCOUNTER — Inpatient Hospital Stay: Payer: Self-pay | Attending: Family

## 2019-02-13 ENCOUNTER — Encounter: Payer: Self-pay | Admitting: Family

## 2019-02-13 ENCOUNTER — Inpatient Hospital Stay (HOSPITAL_BASED_OUTPATIENT_CLINIC_OR_DEPARTMENT_OTHER): Payer: Self-pay | Admitting: Family

## 2019-02-13 VITALS — BP 118/82 | HR 71 | Temp 97.8°F | Resp 17 | Ht 73.0 in | Wt 185.0 lb

## 2019-02-13 VITALS — BP 100/66 | HR 74 | Temp 97.9°F | Resp 18

## 2019-02-13 VITALS — BP 116/72 | HR 75 | Temp 97.8°F | Resp 18 | Ht 73.0 in | Wt 187.0 lb

## 2019-02-13 DIAGNOSIS — E291 Testicular hypofunction: Secondary | ICD-10-CM

## 2019-02-13 DIAGNOSIS — D500 Iron deficiency anemia secondary to blood loss (chronic): Secondary | ICD-10-CM

## 2019-02-13 DIAGNOSIS — G8929 Other chronic pain: Secondary | ICD-10-CM

## 2019-02-13 DIAGNOSIS — Z79899 Other long term (current) drug therapy: Secondary | ICD-10-CM | POA: Insufficient documentation

## 2019-02-13 DIAGNOSIS — M545 Low back pain: Secondary | ICD-10-CM

## 2019-02-13 DIAGNOSIS — R41840 Attention and concentration deficit: Secondary | ICD-10-CM

## 2019-02-13 DIAGNOSIS — D751 Secondary polycythemia: Secondary | ICD-10-CM

## 2019-02-13 DIAGNOSIS — N5089 Other specified disorders of the male genital organs: Secondary | ICD-10-CM

## 2019-02-13 DIAGNOSIS — R5383 Other fatigue: Secondary | ICD-10-CM | POA: Insufficient documentation

## 2019-02-13 DIAGNOSIS — R5382 Chronic fatigue, unspecified: Secondary | ICD-10-CM

## 2019-02-13 DIAGNOSIS — M25512 Pain in left shoulder: Secondary | ICD-10-CM

## 2019-02-13 DIAGNOSIS — Z7982 Long term (current) use of aspirin: Secondary | ICD-10-CM | POA: Insufficient documentation

## 2019-02-13 DIAGNOSIS — F411 Generalized anxiety disorder: Secondary | ICD-10-CM

## 2019-02-13 LAB — CBC WITH DIFFERENTIAL (CANCER CENTER ONLY)
Abs Immature Granulocytes: 0.02 10*3/uL (ref 0.00–0.07)
Basophils Absolute: 0.1 10*3/uL (ref 0.0–0.1)
Basophils Relative: 1 %
Eosinophils Absolute: 0.1 10*3/uL (ref 0.0–0.5)
Eosinophils Relative: 1 %
HCT: 44.3 % (ref 39.0–52.0)
Hemoglobin: 14.4 g/dL (ref 13.0–17.0)
Immature Granulocytes: 0 %
Lymphocytes Relative: 17 %
Lymphs Abs: 1.3 10*3/uL (ref 0.7–4.0)
MCH: 28 pg (ref 26.0–34.0)
MCHC: 32.5 g/dL (ref 30.0–36.0)
MCV: 86.2 fL (ref 80.0–100.0)
Monocytes Absolute: 0.8 10*3/uL (ref 0.1–1.0)
Monocytes Relative: 10 %
Neutro Abs: 5.5 10*3/uL (ref 1.7–7.7)
Neutrophils Relative %: 71 %
Platelet Count: 267 10*3/uL (ref 150–400)
RBC: 5.14 MIL/uL (ref 4.22–5.81)
RDW: 13.4 % (ref 11.5–15.5)
WBC Count: 7.7 10*3/uL (ref 4.0–10.5)
nRBC: 0 % (ref 0.0–0.2)

## 2019-02-13 LAB — CMP (CANCER CENTER ONLY)
ALT: 16 U/L (ref 0–44)
AST: 17 U/L (ref 15–41)
Albumin: 4.8 g/dL (ref 3.5–5.0)
Alkaline Phosphatase: 43 U/L (ref 38–126)
Anion gap: 6 (ref 5–15)
BUN: 14 mg/dL (ref 6–20)
CO2: 29 mmol/L (ref 22–32)
Calcium: 9.5 mg/dL (ref 8.9–10.3)
Chloride: 103 mmol/L (ref 98–111)
Creatinine: 0.9 mg/dL (ref 0.61–1.24)
GFR, Est AFR Am: >60 mL/min (ref >60)
GFR, Estimated: >60 mL/min (ref >60)
Glucose, Bld: 72 mg/dL (ref 70–99)
Potassium: 4.3 mmol/L (ref 3.5–5.1)
Sodium: 138 mmol/L (ref 135–145)
Total Bilirubin: 0.5 mg/dL (ref 0.3–1.2)
Total Protein: 7.2 g/dL (ref 6.5–8.1)

## 2019-02-13 MED ORDER — TRAMADOL HCL 50 MG PO TABS: 50.0000 mg | ORAL_TABLET | Freq: Three times a day (TID) | ORAL | 0 refills | Status: DC | PRN

## 2019-02-13 MED ORDER — BUPROPION HCL ER (SR) 150 MG PO TB12: 150.0000 mg | ORAL_TABLET | Freq: Two times a day (BID) | ORAL | 3 refills | Status: DC

## 2019-02-13 MED ORDER — MELOXICAM 7.5 MG PO TABS: 7.5000 mg | ORAL_TABLET | Freq: Every day | ORAL | 1 refills | Status: DC

## 2019-02-13 NOTE — Progress Notes (Signed)
Hematology and Oncology Follow Up Visit  Stuart Hill [id number] [birth year] 49 y.o. 02/13/2019   Principle Diagnosis:  Secondary polycythemia JAK2 (-), smoker  LEFT Bell's palsy  Current Therapy:   Phlebotomy to maintain hematocrit below 42% 2 baby aspirin daily at bedtime   Interim History:  Stuart Hill is here today for follow-up and phlebotomy. He is feeling quite fatigued and states that he can sleep 14 hours at a time. He feels this may be due to low testosterone. Result is pending.  He has started taking 2 baby aspirin at night before bed.  No bleeding, bruising or petechiae.  No fever, chills, n/v, cough, rash, dizziness, SOB, chest pain, palpitations, abdominal pain or changes in bowel or bladder habits.  No swelling in his extremities.  The numbness and tingling in his feet is unchanged. He attributes this to chronic back problems.  No falls or syncope.  He has maintained a good appetite but admits that he needs to better hydrated. He has been drinking Pedialyte.   ECOG Performance Status: 1 - Symptomatic but completely ambulatory  Medications:  Allergies as of 02/13/2019      Reactions   Asa [aspirin] Hives   Plavix [clopidogrel Bisulfate] Other (See Comments)   Confusion, lethargy, slurred speech   Acetaminophen Hives   Afrin [nasal Spray] Hives   Antihistamines, Chlorpheniramine-type Hives   Benadryl [diphenhydramine Hcl] Hives   Buprenorphine Hcl Hives   Carisoprodol Hives   Codeine Hives   Metoclopramide Hives   Morphine And Related Hives   Oxymetazoline Hives   Ibuprofen Hives   Pheniramine Hives, Rash      Medication List       Accurate as of February 13, 2019 10:57 AM. If you have any questions, ask your nurse or doctor.        ALPRAZolam 1 MG tablet Commonly known as: XANAX TAKE 1 TABLET BY MOUTH THREE TIMES A DAY AS NEEDED FOR ANXIETY   meloxicam 15 MG tablet Commonly known as: MOBIC Take 1/2 or 1 daily -Use as needed for joint pain    sildenafil 20 MG tablet Commonly known as: REVATIO TAKE 1-3 TABLETS BY MOUTH PRIOR TO SEXUAL ACTIVITY AS NEEDED       Allergies:  Allergies  Allergen Reactions  . Asa [Aspirin] Hives  . Plavix [Clopidogrel Bisulfate] Other (See Comments)    Confusion, lethargy, slurred speech  . Acetaminophen Hives  . Afrin [Nasal Spray] Hives  . Antihistamines, Chlorpheniramine-Type Hives  . Benadryl [Diphenhydramine Hcl] Hives  . Buprenorphine Hcl Hives  . Carisoprodol Hives  . Codeine Hives  . Metoclopramide Hives  . Morphine And Related Hives  . Oxymetazoline Hives  . Ibuprofen Hives  . Pheniramine Hives and Rash    Past Medical History, Surgical history, Social history, and Family History were reviewed and updated.  Review of Systems: All other 10 point review of systems is negative.   Physical Exam:  height is 6\' 1"  (1.854 m) and weight is 187 lb (84.8 kg). His temporal temperature is 97.8 F (36.6 C). His blood pressure is 116/72 and his pulse is 75. His respiration is 18 and oxygen saturation is 100%.   Wt Readings from Last 3 Encounters:  02/13/19 187 lb (84.8 kg)  10/12/18 192 lb (87.1 kg)  09/19/18 189 lb (85.7 kg)    Ocular: Sclerae unicteric, pupils equal, round and reactive to light Ear-nose-throat: Oropharynx clear, dentition fair Lymphatic: No cervical or supraclavicular adenopathy Lungs no rales or rhonchi, good excursion bilaterally  Heart regular rate and rhythm, no murmur appreciated Abd soft, nontender, positive bowel sounds, no liver or spleen tip palpated on exam, no fluid wave  MSK no focal spinal tenderness, no joint edema Neuro: non-focal, well-oriented, appropriate affect Breasts: Deferred   Lab Results  Component Value Date   WBC 7.7 02/13/2019   HGB 14.4 02/13/2019   HCT 44.3 02/13/2019   MCV 86.2 02/13/2019   PLT 267 02/13/2019   Lab Results  Component Value Date   FERRITIN 8 (L) 01/12/2019   IRON 51 01/12/2019   TIBC 533 (H) 01/12/2019    UIBC 481 (H) 01/12/2019   IRONPCTSAT 10 (L) 01/12/2019   Lab Results  Component Value Date   RETICCTPCT 1.0 12/20/2017   RBC 5.14 02/13/2019   No results found for: KPAFRELGTCHN, LAMBDASER, KAPLAMBRATIO No results found for: IGGSERUM, IGA, IGMSERUM No results found for: Kathrynn Ducking, MSPIKE, SPEI   Chemistry      Component Value Date/Time   NA 140 01/12/2019 0925   NA 143 01/01/2017 1453   NA 139 11/12/2015 0945   K 4.2 01/12/2019 0925   K 4.3 01/01/2017 1453   K 3.7 11/12/2015 0945   CL 106 01/12/2019 0925   CL 103 01/01/2017 1453   CO2 28 01/12/2019 0925   CO2 27 01/01/2017 1453   CO2 22 11/12/2015 0945   BUN 15 01/12/2019 0925   BUN 18 01/01/2017 1453   BUN 13.1 11/12/2015 0945   CREATININE 0.82 01/12/2019 0925   CREATININE 1.0 01/01/2017 1453   CREATININE 0.9 11/12/2015 0945      Component Value Date/Time   CALCIUM 8.8 (L) 01/12/2019 0925   CALCIUM 8.8 01/01/2017 1453   CALCIUM 8.9 11/12/2015 0945   ALKPHOS 33 (L) 01/12/2019 0925   ALKPHOS 44 01/01/2017 1453   ALKPHOS 66 11/12/2015 0945   AST 21 01/12/2019 0925   AST 13 11/12/2015 0945   ALT 27 01/12/2019 0925   ALT 368 (HH) 01/01/2017 1453   ALT 14 11/12/2015 0945   BILITOT 0.3 01/12/2019 0925   BILITOT <0.22 11/12/2015 0945       Impression and Plan: Stuart Hill is a 49 yo caucasian male with history of secondary polycythemia, JAK2 negative.  Testosterone is pending.  Hct is 44.3 so we did do a phlebotomy today.  We will see him back in another month for lab/phlebotomy only and then follow-up in 2 months.  He will contact our office with any questions or concerns. We can certainly see him sooner if needed.   [name], NP 1/18/202110:57 AM

## 2019-02-13 NOTE — Telephone Encounter (Signed)
Appointments scheduled calendar printed in infusion per 1/18 los

## 2019-02-13 NOTE — Progress Notes (Signed)
16g PIV to Right AC, removed 531cc blood from 1121-1128. Pt tolerated procedure without difficulty. Nutrition and hydration provided. Pt observed x30 minutes

## 2019-02-14 ENCOUNTER — Telehealth: Payer: Self-pay | Admitting: *Deleted

## 2019-02-14 LAB — TESTOSTERONE: Testosterone: 374 ng/dL (ref 264–916)

## 2019-02-14 LAB — FERRITIN: Ferritin: 6 ng/mL — ABNORMAL LOW (ref 24–336)

## 2019-02-14 LAB — IRON AND TIBC
Iron: 109 ug/dL (ref 42–163)
Saturation Ratios: 21 % (ref 20–55)
TIBC: 513 ug/dL — ABNORMAL HIGH (ref 202–409)
UIBC: 403 ug/dL — ABNORMAL HIGH (ref 117–376)

## 2019-02-14 NOTE — Telephone Encounter (Signed)
-----   Message from Eliezer Bottom, NP sent at 02/14/2019  9:00 AM EST ----- Testosterone looks good!   ----- Message ----- From: Interface, Lab In [facility] Sent: 02/14/2019   3:35 AM EST To: Eliezer Bottom, NP

## 2019-02-14 NOTE — Telephone Encounter (Signed)
Message left for pt per order of S. [place] NP to notify pt that his "testosterone looks good!"  Instructed pt to call office back with any questions or concerns.

## 2019-02-17 ENCOUNTER — Ambulatory Visit: Payer: Self-pay | Admitting: Hematology & Oncology

## 2019-02-17 ENCOUNTER — Other Ambulatory Visit: Payer: Self-pay

## 2019-03-08 ENCOUNTER — Other Ambulatory Visit: Payer: Self-pay | Admitting: Family Medicine

## 2019-03-08 DIAGNOSIS — G8929 Other chronic pain: Secondary | ICD-10-CM

## 2019-03-08 NOTE — Telephone Encounter (Signed)
Requesting:Tramadol Contract:none [id number] Last Visit:02/13/2019 Next Visit:none scheduled with pcp Last Refill: not on current med list.   Please Advise

## 2019-03-17 ENCOUNTER — Inpatient Hospital Stay: Payer: Self-pay

## 2019-03-21 ENCOUNTER — Inpatient Hospital Stay: Payer: Self-pay | Attending: Family

## 2019-03-21 ENCOUNTER — Other Ambulatory Visit: Payer: Self-pay

## 2019-03-21 ENCOUNTER — Inpatient Hospital Stay: Payer: Self-pay

## 2019-03-21 VITALS — BP 136/79 | HR 70 | Temp 98.7°F

## 2019-03-21 DIAGNOSIS — D500 Iron deficiency anemia secondary to blood loss (chronic): Secondary | ICD-10-CM

## 2019-03-21 DIAGNOSIS — D751 Secondary polycythemia: Secondary | ICD-10-CM

## 2019-03-21 LAB — CBC WITH DIFFERENTIAL (CANCER CENTER ONLY)
Abs Immature Granulocytes: 0.03 10*3/uL (ref 0.00–0.07)
Basophils Absolute: 0.1 10*3/uL (ref 0.0–0.1)
Basophils Relative: 1 %
Eosinophils Absolute: 0.1 10*3/uL (ref 0.0–0.5)
Eosinophils Relative: 1 %
HCT: 43.9 % (ref 39.0–52.0)
Hemoglobin: 14.5 g/dL (ref 13.0–17.0)
Immature Granulocytes: 0 %
Lymphocytes Relative: 19 %
Lymphs Abs: 1.6 10*3/uL (ref 0.7–4.0)
MCH: 27.6 pg (ref 26.0–34.0)
MCHC: 33 g/dL (ref 30.0–36.0)
MCV: 83.5 fL (ref 80.0–100.0)
Monocytes Absolute: 0.8 10*3/uL (ref 0.1–1.0)
Monocytes Relative: 9 %
Neutro Abs: 5.7 10*3/uL (ref 1.7–7.7)
Neutrophils Relative %: 70 %
Platelet Count: 231 10*3/uL (ref 150–400)
RBC: 5.26 MIL/uL (ref 4.22–5.81)
RDW: 14.7 % (ref 11.5–15.5)
WBC Count: 8.2 10*3/uL (ref 4.0–10.5)
nRBC: 0 % (ref 0.0–0.2)

## 2019-03-21 LAB — CMP (CANCER CENTER ONLY)
ALT: 16 U/L (ref 0–44)
AST: 15 U/L (ref 15–41)
Albumin: 4.6 g/dL (ref 3.5–5.0)
Alkaline Phosphatase: 48 U/L (ref 38–126)
Anion gap: 6 (ref 5–15)
BUN: 15 mg/dL (ref 6–20)
CO2: 28 mmol/L (ref 22–32)
Calcium: 9.4 mg/dL (ref 8.9–10.3)
Chloride: 105 mmol/L (ref 98–111)
Creatinine: 0.97 mg/dL (ref 0.61–1.24)
GFR, Est AFR Am: >60 mL/min (ref >60)
GFR, Estimated: >60 mL/min (ref >60)
Glucose, Bld: 93 mg/dL (ref 70–99)
Potassium: 4.4 mmol/L (ref 3.5–5.1)
Sodium: 139 mmol/L (ref 135–145)
Total Bilirubin: 0.3 mg/dL (ref 0.3–1.2)
Total Protein: 7.5 g/dL (ref 6.5–8.1)

## 2019-03-21 NOTE — Patient Instructions (Signed)

## 2019-03-22 LAB — FERRITIN: Ferritin: 7 ng/mL — ABNORMAL LOW (ref 24–336)

## 2019-03-22 LAB — IRON AND TIBC
Iron: 28 ug/dL — ABNORMAL LOW (ref 42–163)
Saturation Ratios: 5 % — ABNORMAL LOW (ref 20–55)
TIBC: 519 ug/dL — ABNORMAL HIGH (ref 202–409)
UIBC: 491 ug/dL — ABNORMAL HIGH (ref 117–376)

## 2019-04-10 ENCOUNTER — Encounter: Payer: Self-pay | Admitting: Family

## 2019-04-10 ENCOUNTER — Other Ambulatory Visit: Payer: Self-pay

## 2019-04-10 ENCOUNTER — Telehealth: Payer: Self-pay | Admitting: Family

## 2019-04-10 ENCOUNTER — Inpatient Hospital Stay (HOSPITAL_BASED_OUTPATIENT_CLINIC_OR_DEPARTMENT_OTHER): Payer: Self-pay | Admitting: Family

## 2019-04-10 ENCOUNTER — Inpatient Hospital Stay: Payer: Self-pay

## 2019-04-10 ENCOUNTER — Encounter: Payer: Self-pay | Admitting: Family Medicine

## 2019-04-10 ENCOUNTER — Inpatient Hospital Stay: Payer: Self-pay | Attending: Family

## 2019-04-10 VITALS — BP 134/84 | HR 70 | Temp 98.6°F | Resp 18 | Ht 73.0 in | Wt 184.0 lb

## 2019-04-10 DIAGNOSIS — D500 Iron deficiency anemia secondary to blood loss (chronic): Secondary | ICD-10-CM

## 2019-04-10 DIAGNOSIS — D751 Secondary polycythemia: Secondary | ICD-10-CM

## 2019-04-10 LAB — CMP (CANCER CENTER ONLY)
ALT: 19 U/L (ref 0–44)
AST: 17 U/L (ref 15–41)
Albumin: 4.6 g/dL (ref 3.5–5.0)
Alkaline Phosphatase: 48 U/L (ref 38–126)
Anion gap: 7 (ref 5–15)
BUN: 19 mg/dL (ref 6–20)
CO2: 28 mmol/L (ref 22–32)
Calcium: 9.5 mg/dL (ref 8.9–10.3)
Chloride: 103 mmol/L (ref 98–111)
Creatinine: 0.91 mg/dL (ref 0.61–1.24)
GFR, Est AFR Am: >60 mL/min (ref >60)
GFR, Estimated: >60 mL/min (ref >60)
Glucose, Bld: 109 mg/dL — ABNORMAL HIGH (ref 70–99)
Potassium: 4.2 mmol/L (ref 3.5–5.1)
Sodium: 138 mmol/L (ref 135–145)
Total Bilirubin: 0.3 mg/dL (ref 0.3–1.2)
Total Protein: 7.3 g/dL (ref 6.5–8.1)

## 2019-04-10 LAB — CBC WITH DIFFERENTIAL (CANCER CENTER ONLY)
Abs Immature Granulocytes: 0.03 10*3/uL (ref 0.00–0.07)
Basophils Absolute: 0.1 10*3/uL (ref 0.0–0.1)
Basophils Relative: 1 %
Eosinophils Absolute: 0.2 10*3/uL (ref 0.0–0.5)
Eosinophils Relative: 2 %
HCT: 43.9 % (ref 39.0–52.0)
Hemoglobin: 14 g/dL (ref 13.0–17.0)
Immature Granulocytes: 0 %
Lymphocytes Relative: 21 %
Lymphs Abs: 2 10*3/uL (ref 0.7–4.0)
MCH: 26.9 pg (ref 26.0–34.0)
MCHC: 31.9 g/dL (ref 30.0–36.0)
MCV: 84.3 fL (ref 80.0–100.0)
Monocytes Absolute: 0.8 10*3/uL (ref 0.1–1.0)
Monocytes Relative: 8 %
Neutro Abs: 6.4 10*3/uL (ref 1.7–7.7)
Neutrophils Relative %: 68 %
Platelet Count: 316 10*3/uL (ref 150–400)
RBC: 5.21 MIL/uL (ref 4.22–5.81)
RDW: 15.1 % (ref 11.5–15.5)
WBC Count: 9.4 10*3/uL (ref 4.0–10.5)
nRBC: 0 % (ref 0.0–0.2)

## 2019-04-10 LAB — IRON AND TIBC
Iron: 23 ug/dL — ABNORMAL LOW (ref 42–163)
Saturation Ratios: 5 % — ABNORMAL LOW (ref 20–55)
TIBC: 501 ug/dL — ABNORMAL HIGH (ref 202–409)
UIBC: 478 ug/dL — ABNORMAL HIGH (ref 117–376)

## 2019-04-10 LAB — FERRITIN: Ferritin: 5 ng/mL — ABNORMAL LOW (ref 24–336)

## 2019-04-10 NOTE — Progress Notes (Signed)
Hematology and Oncology Follow Up Visit  Stuart Hill [id number] September 03, 1970 49 y.o. 04/10/2019   Principle Diagnosis:  Secondary polycythemia JAK2 (-), smoker  LEFT Bell's palsy  Current Therapy:   Phlebotomy to maintain hematocrit below 42% 2 baby aspirin daily at bedtime   Interim History:  Stuart Hill is here today for follow-up and phlebotomy for Hct 43.9%. He is feeling fatigued at this time.  No episodes of bleeding. No bruising or petechiae.  He has occasional numbness and tingling in his hands and feet he states is positional.  No falls or syncope.  No fever, chills, n/v, cough, rash, dizziness, SOB, chest pain, palpitations, abdominal pain or changes in bowel or bladder habits.  He is still smoking daily.  No swelling in his extremities at this time.  No falls or syncopal episodes to report.  He has maintained a good appetite and is staying well hydrated. His weight is stable at 184 lbs.   ECOG Performance Status: 1 - Symptomatic but completely ambulatory  Medications:  Allergies as of 04/10/2019      Reactions   Plavix [clopidogrel Bisulfate] Other (See Comments)   Confusion, lethargy, slurred speech   Afrin [nasal Spray] Hives   Antihistamines, Chlorpheniramine-type Hives   Benadryl [diphenhydramine Hcl] Hives   Buprenorphine Hcl Hives   Carisoprodol Hives   Codeine Hives   Metoclopramide Hives   Morphine And Related Hives   Oxymetazoline Hives   Pheniramine Hives, Rash      Medication List       Accurate as of April 10, 2019 10:03 AM. If you have any questions, ask your nurse or doctor.        ALPRAZolam 1 MG tablet Commonly known as: XANAX TAKE 1 TABLET BY MOUTH THREE TIMES A DAY AS NEEDED FOR ANXIETY   buPROPion 150 MG 12 hr tablet Commonly known as: Wellbutrin SR Take 1 tablet (150 mg total) by mouth 2 (two) times daily. Take once daily for the first week   meloxicam 15 MG tablet Commonly known as: MOBIC Take 1/2 or 1 daily -Use as  needed for joint pain   meloxicam 7.5 MG tablet Commonly known as: MOBIC Take 1 tablet (7.5 mg total) by mouth daily. Use as needed for back and shoulder pain   sildenafil 20 MG tablet Commonly known as: REVATIO TAKE 1-3 TABLETS BY MOUTH PRIOR TO SEXUAL ACTIVITY AS NEEDED   traMADol 50 MG tablet Commonly known as: ULTRAM TAKE 1 TABLET BY MOUTH EVERY 8 HOURS AS NEEDED FOR UP TO 5 DAYS. USE SPARINGLY FOR PAIN       Allergies:  Allergies  Allergen Reactions  . Plavix [Clopidogrel Bisulfate] Other (See Comments)    Confusion, lethargy, slurred speech  . Afrin [Nasal Spray] Hives  . Antihistamines, Chlorpheniramine-Type Hives  . Benadryl [Diphenhydramine Hcl] Hives  . Buprenorphine Hcl Hives  . Carisoprodol Hives  . Codeine Hives  . Metoclopramide Hives  . Morphine And Related Hives  . Oxymetazoline Hives  . Pheniramine Hives and Rash    Past Medical History, Surgical history, Social history, and Family History were reviewed and updated.  Review of Systems: All other 10 point review of systems is negative.   Physical Exam:  vitals were not taken for this visit.   Wt Readings from Last 3 Encounters:  02/13/19 185 lb (83.9 kg)  02/13/19 187 lb (84.8 kg)  10/12/18 192 lb (87.1 kg)    Ocular: Sclerae unicteric, pupils equal, round and reactive to light Ear-nose-throat: Oropharynx  clear, dentition fair Lymphatic: No cervical or supraclavicular adenopathy Lungs no rales or rhonchi, good excursion bilaterally Heart regular rate and rhythm, no murmur appreciated Abd soft, nontender, positive bowel sounds, no liver or spleen tip palpated on exam, no fluid wave  MSK no focal spinal tenderness, no joint edema Neuro: non-focal, well-oriented, appropriate affect Breasts: Deferred   Lab Results  Component Value Date   WBC 9.4 04/10/2019   HGB 14.0 04/10/2019   HCT 43.9 04/10/2019   MCV 84.3 04/10/2019   PLT 316 04/10/2019   Lab Results  Component Value Date   FERRITIN  7 (L) 03/21/2019   IRON 28 (L) 03/21/2019   TIBC 519 (H) 03/21/2019   UIBC 491 (H) 03/21/2019   IRONPCTSAT 5 (L) 03/21/2019   Lab Results  Component Value Date   RETICCTPCT 1.0 12/20/2017   RBC 5.21 04/10/2019   No results found for: KPAFRELGTCHN, LAMBDASER, KAPLAMBRATIO No results found for: IGGSERUM, IGA, IGMSERUM No results found for: Kathrynn Ducking, MSPIKE, SPEI   Chemistry      Component Value Date/Time   NA 139 03/21/2019 1047   NA 143 01/01/2017 1453   NA 139 11/12/2015 0945   K 4.4 03/21/2019 1047   K 4.3 01/01/2017 1453   K 3.7 11/12/2015 0945   CL 105 03/21/2019 1047   CL 103 01/01/2017 1453   CO2 28 03/21/2019 1047   CO2 27 01/01/2017 1453   CO2 22 11/12/2015 0945   BUN 15 03/21/2019 1047   BUN 18 01/01/2017 1453   BUN 13.1 11/12/2015 0945   CREATININE 0.97 03/21/2019 1047   CREATININE 1.0 01/01/2017 1453   CREATININE 0.9 11/12/2015 0945      Component Value Date/Time   CALCIUM 9.4 03/21/2019 1047   CALCIUM 8.8 01/01/2017 1453   CALCIUM 8.9 11/12/2015 0945   ALKPHOS 48 03/21/2019 1047   ALKPHOS 44 01/01/2017 1453   ALKPHOS 66 11/12/2015 0945   AST 15 03/21/2019 1047   AST 13 11/12/2015 0945   ALT 16 03/21/2019 1047   ALT 368 (HH) 01/01/2017 1453   ALT 14 11/12/2015 0945   BILITOT 0.3 03/21/2019 1047   BILITOT <0.22 11/12/2015 0945       Impression and Plan: Stuart Hill is a 49 yo caucasian male with history of secondary polycythemia, JAK2 negative.  He had phlebotomy today for Hct 43.9%.  We will see him back in another month for lab/phlebotomy only and then follow-up in 2 months.  He will contact our office with any questions or concerns. We can certainly see him sooner if needed.   [name], NP 3/15/202110:03 AM

## 2019-04-10 NOTE — Patient Instructions (Signed)

## 2019-04-10 NOTE — Telephone Encounter (Signed)
Appointments scheduled calendar printed per 3/15 los 

## 2019-04-12 ENCOUNTER — Other Ambulatory Visit: Payer: Self-pay | Admitting: Family Medicine

## 2019-04-12 DIAGNOSIS — F411 Generalized anxiety disorder: Secondary | ICD-10-CM

## 2019-04-13 NOTE — Telephone Encounter (Signed)
Requesting: Alprazolam 1mg , TID Contract:n/a UDS:n/a Last Visit:02/13/19 Next Visit:n/a Last Refill:01/09/19(90,3)  Please Advise. Medication pending
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
## 2019-04-19 ENCOUNTER — Encounter: Payer: Self-pay | Admitting: Family Medicine

## 2019-04-19 ENCOUNTER — Ambulatory Visit (INDEPENDENT_AMBULATORY_CARE_PROVIDER_SITE_OTHER): Payer: Self-pay | Admitting: Family Medicine

## 2019-04-19 ENCOUNTER — Other Ambulatory Visit: Payer: Self-pay

## 2019-04-19 VITALS — BP 122/77 | HR 60 | Resp 16 | Ht 73.0 in | Wt 185.0 lb

## 2019-04-19 DIAGNOSIS — G8929 Other chronic pain: Secondary | ICD-10-CM

## 2019-04-19 DIAGNOSIS — Z13220 Encounter for screening for lipoid disorders: Secondary | ICD-10-CM

## 2019-04-19 DIAGNOSIS — M25512 Pain in left shoulder: Secondary | ICD-10-CM

## 2019-04-19 DIAGNOSIS — F411 Generalized anxiety disorder: Secondary | ICD-10-CM

## 2019-04-19 MED ORDER — TRAMADOL HCL 50 MG PO TABS: ORAL_TABLET | ORAL | 3 refills | Status: DC

## 2019-04-19 MED ORDER — ALPRAZOLAM 1 MG PO TABS: ORAL_TABLET | ORAL | 3 refills | Status: DC

## 2019-04-19 NOTE — Patient Instructions (Signed)
Good to see you again today- I will be in touch with your cholesterol panel asap  I am sorry for all the trauma you have been though, and hope that you will feel more hopeful soon.  Seeing a counselor may be helpful for you!  Perhaps ask [name]'s counselor for a recommendation  You can take tramadol once a day- perhaps at lunch- as needed for pain Ok to go back on wellbutrin

## 2019-05-11 ENCOUNTER — Inpatient Hospital Stay: Payer: Self-pay

## 2019-05-12 ENCOUNTER — Other Ambulatory Visit: Payer: Self-pay

## 2019-05-12 ENCOUNTER — Inpatient Hospital Stay: Payer: Self-pay | Attending: Family

## 2019-05-12 ENCOUNTER — Inpatient Hospital Stay: Payer: Self-pay

## 2019-05-12 DIAGNOSIS — D500 Iron deficiency anemia secondary to blood loss (chronic): Secondary | ICD-10-CM | POA: Insufficient documentation

## 2019-05-12 DIAGNOSIS — D751 Secondary polycythemia: Secondary | ICD-10-CM | POA: Insufficient documentation

## 2019-05-12 LAB — CBC WITH DIFFERENTIAL (CANCER CENTER ONLY)
Abs Immature Granulocytes: 0.03 10*3/uL (ref 0.00–0.07)
Basophils Absolute: 0.1 10*3/uL (ref 0.0–0.1)
Basophils Relative: 1 %
Eosinophils Absolute: 0.2 10*3/uL (ref 0.0–0.5)
Eosinophils Relative: 2 %
HCT: 39.3 % (ref 39.0–52.0)
Hemoglobin: 12.6 g/dL — ABNORMAL LOW (ref 13.0–17.0)
Immature Granulocytes: 0 %
Lymphocytes Relative: 19 %
Lymphs Abs: 1.9 10*3/uL (ref 0.7–4.0)
MCH: 26.3 pg (ref 26.0–34.0)
MCHC: 32.1 g/dL (ref 30.0–36.0)
MCV: 82 fL (ref 80.0–100.0)
Monocytes Absolute: 0.9 10*3/uL (ref 0.1–1.0)
Monocytes Relative: 9 %
Neutro Abs: 6.7 10*3/uL (ref 1.7–7.7)
Neutrophils Relative %: 69 %
Platelet Count: 301 10*3/uL (ref 150–400)
RBC: 4.79 MIL/uL (ref 4.22–5.81)
RDW: 15.3 % (ref 11.5–15.5)
WBC Count: 9.9 10*3/uL (ref 4.0–10.5)
nRBC: 0 % (ref 0.0–0.2)

## 2019-05-12 LAB — CMP (CANCER CENTER ONLY)
ALT: 13 U/L (ref 0–44)
AST: 17 U/L (ref 15–41)
Albumin: 4.2 g/dL (ref 3.5–5.0)
Alkaline Phosphatase: 45 U/L (ref 38–126)
Anion gap: 6 (ref 5–15)
BUN: 12 mg/dL (ref 6–20)
CO2: 29 mmol/L (ref 22–32)
Calcium: 9 mg/dL (ref 8.9–10.3)
Chloride: 105 mmol/L (ref 98–111)
Creatinine: 0.79 mg/dL (ref 0.61–1.24)
GFR, Est AFR Am: >60 mL/min (ref >60)
GFR, Estimated: >60 mL/min (ref >60)
Glucose, Bld: 96 mg/dL (ref 70–99)
Potassium: 4.6 mmol/L (ref 3.5–5.1)
Sodium: 140 mmol/L (ref 135–145)
Total Bilirubin: 0.3 mg/dL (ref 0.3–1.2)
Total Protein: 6.6 g/dL (ref 6.5–8.1)

## 2019-05-15 LAB — IRON AND TIBC
Iron: 19 ug/dL — ABNORMAL LOW (ref 42–163)
Saturation Ratios: 4 % — ABNORMAL LOW (ref 20–55)
TIBC: 499 ug/dL — ABNORMAL HIGH (ref 202–409)
UIBC: 480 ug/dL — ABNORMAL HIGH (ref 117–376)

## 2019-05-15 LAB — FERRITIN: Ferritin: 8 ng/mL — ABNORMAL LOW (ref 24–336)

## 2019-06-09 ENCOUNTER — Inpatient Hospital Stay: Payer: PRIVATE HEALTH INSURANCE | Admitting: Family

## 2019-06-09 ENCOUNTER — Inpatient Hospital Stay: Payer: PRIVATE HEALTH INSURANCE

## 2019-06-09 ENCOUNTER — Inpatient Hospital Stay: Payer: PRIVATE HEALTH INSURANCE | Attending: Family

## 2019-06-09 DIAGNOSIS — Z7982 Long term (current) use of aspirin: Secondary | ICD-10-CM | POA: Insufficient documentation

## 2019-06-09 DIAGNOSIS — D751 Secondary polycythemia: Secondary | ICD-10-CM | POA: Insufficient documentation

## 2019-06-12 ENCOUNTER — Telehealth: Payer: Self-pay
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
## 2019-06-12 NOTE — Telephone Encounter (Signed)
Left message to return call 

## 2019-06-13 NOTE — Telephone Encounter (Signed)
Patient scheduled appointment to see [facility] 06/15/2019.

## 2019-06-15 ENCOUNTER — Other Ambulatory Visit: Payer: Self-pay

## 2019-06-15 ENCOUNTER — Ambulatory Visit: Payer: PRIVATE HEALTH INSURANCE | Admitting: Family Medicine

## 2019-06-15 ENCOUNTER — Encounter: Payer: Self-pay | Admitting: Family Medicine

## 2019-06-15 VITALS — BP 129/91 | HR 77 | Temp 98.7°F | Resp 17 | Ht 73.0 in | Wt 179.0 lb

## 2019-06-15 DIAGNOSIS — F331 Major depressive disorder, recurrent, moderate: Secondary | ICD-10-CM | POA: Diagnosis not present

## 2019-06-15 MED ORDER — PAROXETINE HCL 20 MG PO TABS: 20.0000 mg | ORAL_TABLET | Freq: Every day | ORAL | 3 refills | Status: DC

## 2019-06-15 NOTE — Progress Notes (Signed)
[facility] [employer] [facility] [address], [place], [place] [address] [phone]  Date:  06/15/2019   Name:  Stuart Hill   DOB:  April 30, 1970   MRN:  [id number]  PCP:  Darreld Mclean, MD    Chief Complaint: Anxiety (trouble sleeping at night, medication change-would like abilify instead of wellbutrin, took self out of work)   History of Present Illness:  Stuart Hill is a 49 y.o. very pleasant male patient who presents with the following:  Patient has history of sleep apnea, reportedly status post tonsillectomy, polycythemia vera, tobacco use, anxiety Here today with concern of depression symptoms He feels like his depression and anxiety is getting worse He took a leave from his job- he cannot keep this up forever however as he needs to make money Markelle notes that his wife [name] tried to cut her own throat 3-4 weeks ago- he took her to the ER and she was admitted to psychiatry.    She is now home again and doing better, but of course this was very stressful and traumatic for them both   He is seeing a counselor - they have their 4th session tomorrow.  Doren Custard at  [facility] [place], [place], [address]  He tends to get very anxious with activities such as going to the store.  He feels very tired, lacks motivation He has lost weight.  He does not feel much like talking or doing anything- except if he drinks a beer maybe he will feel more talkative.  He notes that even taking a shower or eating may seem like too much effort  He states he does NOT feel like harming anyone- including himself- at this time  Wt Readings from Last 3 Encounters:  06/15/19 179 lb (81.2 kg)  04/19/19 185 lb (83.9 kg)  04/10/19 184 lb (83.5 kg)   He takes alprazolam 1 mg 3 times daily for anxiety I prescribed Wellbutrin in January of this year- he is no longer taking this, does not feel like it really  helped him  He has been overusing his xanax to sleep- often taking 2 mg at bedtime He is not using tramadol as he is not working so he does not have MSK pain  He notes that [name] is doing ok on abilify and he wonders if he might take this as well   He has used lexapro in the past- he also used wellbutrin, did take zyprexa in the past- this made him really sleepy He notes that he either sleeps too long or not well at all   Lab work done in April Colon cancer screening  05/29/2019  1   04/19/2019  Alprazolam 1 MG Tablet  90.00  30 Je Cop   [id number]   [place] (1204)   1  6.00 LME  Comm Ins   [place]  05/02/2019  1   04/19/2019  Alprazolam 1 MG Tablet  90.00  30 Je Cop   [id number]   [place] (1204)   0  6.00 LME  Private Pay   [place]  04/19/2019  1   04/19/2019  Tramadol Hcl 50 MG Tablet  30.00  30 Je Cop   [id number]   [place] (1204)   0  5.00 MME  Private Pay   [place]  04/04/2019  1   01/09/2019  Alprazolam 1 MG Tablet  90.00  30 Je Cop  [id number]   [place] (1204)   3  6.00 LME  Private Pay   [place]  03/08/2019  1   03/08/2019  Tramadol Hcl 50 MG Tablet  15.00  5 Je Cop   [id number]   [place] (1204)   0  15.00 MME  Private Pay   [place]  03/07/2019  1   01/09/2019  Alprazolam 1 MG Tablet  90.00  30 Je Cop   [id number]   [place] (1204)   2  6.00 LME  Private Pay   [place]  02/13/2019  1   02/13/2019  Tramadol Hcl 50 MG Tablet  15.00  5 Je Cop   [id number]   [place] (1204)   0  15.00 MME  Private Pay   [place]  02/08/2019  1   01/09/2019  Alprazolam 1 MG Tablet  90.00  30 Je Cop   [id number]   [place] (1204)   1  6.00 LME  Private Pay   [place]  01/10/2019  1   01/09/2019  Alprazolam 1 MG Tablet  90.00  30 Je Cop   [id number]   Nor (1204)   0  6.00 LME  Private Pay   [place]  12/14/2018  1   09/19/2018  Alprazolam 1 MG Tablet  90.00  30 Je Cop   [id number]   Nor (1204)   3  6.00 LME  Private Pay   [place]  11/14/2018  1   09/19/2018  Alprazolam 1 MG Tablet  90.00  30 Je Cop   [id number]   Nor (1204)   2  6.00 LME      Patient Active Problem List   Diagnosis Date Noted  . Tonsillar hypertrophy  04/30/2017  . Obstructive sleep apnea 04/23/2017  . 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 Polycythemia vera ([place]) 05/03/2015  . Hemoptysis 04/03/2015  . Condylomata acuminata 01/18/2015  . Generalized anxiety disorder 03/05/2014    Past Medical History:  Diagnosis Date  . Allergy   . Anxiety   . Arthritis   . Bell's palsy   . Erythrocytosis 04/10/2015  . Hyperlipidemia   . Polycythemia vera ([place]) 05/03/2015    Past Surgical History:  Procedure Laterality Date  . DENTAL SURGERY    . HAND SURGERY    . TONSILLECTOMY Bilateral 04/30/2017   Procedure: TONSILLECTOMY;  Surgeon: Jerrell Belfast, MD;  Location: [facility];  Service: ENT;  Laterality: Bilateral;    Social History   Tobacco Use  . Smoking status: Current Every Day Smoker    Packs/day: 1.00    Years: 24.00    Pack years: 24.00    Types: Cigarettes  . Smokeless tobacco: Never Used  Substance Use Topics  . Alcohol use: Yes    Alcohol/week: 1.0 standard drinks    Types: 1 Cans of beer per week    Comment: 6-10 beer a week  . Drug use: Yes    Types: Other-see comments, Marijuana    Comment: Marijuana every other day.    Family History  Problem Relation Age of Onset  . COPD Mother   . Fibromyalgia Mother   . Heart disease Father   . Stroke Father   . Depression Brother   . Heart disease Brother   . Heart disease Maternal Grandmother   . Stroke Maternal Grandmother   . Heart disease Maternal Grandfather   . Stroke Maternal Grandfather   . Heart disease Paternal Grandmother   . Stroke Paternal Grandmother   . Heart disease Paternal Grandfather   .  Stroke Paternal Grandfather   . Stroke Paternal Aunt     Allergies  Allergen Reactions  . Plavix [Clopidogrel Bisulfate] Other (See Comments)    Confusion, lethargy, slurred speech  . Afrin [Nasal Spray] Hives  . Antihistamines, Chlorpheniramine-Type Hives  . Benadryl [Diphenhydramine Hcl] Hives  . Buprenorphine Hcl Hives  . Carisoprodol  Hives  . Codeine Hives  . Metoclopramide Hives  . Morphine And Related Hives  . Oxymetazoline Hives  . Pheniramine Hives and Rash    Medication list has been reviewed and updated.  Current Outpatient Medications on File Prior to Visit  Medication Sig Dispense Refill  . ALPRAZolam (XANAX) 1 MG tablet Take 1 TID as needed for anxiety 90 tablet 3  . meloxicam (MOBIC) 15 MG tablet Take 1/2 or 1 daily -Use as needed for joint pain 30 tablet 3  . sildenafil (REVATIO) 20 MG tablet TAKE 1-3 TABLETS BY MOUTH PRIOR TO SEXUAL ACTIVITY AS NEEDED 30 tablet 5  . buPROPion (WELLBUTRIN SR) 150 MG 12 hr tablet Take 1 tablet (150 mg total) by mouth 2 (two) times daily. Take once daily for the first week (Patient not taking: Reported on 06/15/2019) 60 tablet 3   No current facility-administered medications on file prior to visit.    Review of Systems:  As per HPI- otherwise negative.  Physical Examination: Vitals:   06/15/19 1447  BP: (!) 129/91  Pulse: 77  Resp: 17  Temp: 98.7 F (37.1 C)  SpO2: 97%   Vitals:   06/15/19 1447  Weight: 179 lb (81.2 kg)  Height: 6\' 1"  (1.854 m)   Body mass index is 23.62 kg/m. Ideal Body Weight: Weight in (lb) to have BMI = 25: 189.1  GEN: No acute distress; alert,appropriate. PULM: Breathing comfortably in no respiratory distress PSYCH: Normally interactive.  Appears his normal self today, accompanied by his wife [name]   Assessment and Plan: Moderate episode of recurrent major depressive disorder ([place]) - Plan: PARoxetine (PAXIL) 20 MG tablet  Pt is here today with worsening/ recurrence of depression and anxiety Using chronic xanax for anxiety Will start him on paxil 20 mg, we discussed adding a second medication such as Abilify at a later date. I encouraged him to consult with a psychiatrist if he is able to find and afford a provider  I will call him in a few days and check in.  He agrees to seek immediate care if any risk of self harm   This  visit occurred during the SARS-CoV-2 public health emergency.  Safety protocols were in place, including screening questions prior to the visit, additional usage of staff PPE, and extensive cleaning of exam room while observing appropriate contact time as indicated for disinfecting solutions.    Signed Lamar Blinks, MD

## 2019-06-15 NOTE — Patient Instructions (Signed)
It was good to see you today-  I hope that the paxil will be helpful for you.  I will give you a call in a few days- if not doing ok in the next few days please let me know

## 2019-06-19 ENCOUNTER — Telehealth: Payer: Self-pay

## 2019-06-19 DIAGNOSIS — F411 Generalized anxiety disorder: Secondary | ICD-10-CM

## 2019-06-19 MED ORDER — ALPRAZOLAM 1 MG PO TABS: ORAL_TABLET | ORAL | 2 refills | Status: DC

## 2019-06-19 MED ORDER — MIRTAZAPINE 15 MG PO TABS
15.0000 mg | ORAL_TABLET | Freq: Every day | ORAL | 2 refills | Status: DC
Start: 2019-06-19 — End: 2019-10-05

## 2019-06-19 NOTE — Telephone Encounter (Signed)
Gave him a call back- we started him on paxil 4 ys ago for depression and anxiety He is not able to sleep and is having sweats, muscle spasms and nausea.   I will have him stop Paxil, will start on Remeron instead.  Start with 50 mg, increase to 2 pills after about a week.  Abhimanyu also admits that he has been taking his Xanax sometimes 4 pills daily, he has almost used up his prescription which he filled on May 3 05/29/2019  1   04/19/2019  Alprazolam 1 MG Tablet  90.00  30 Je Cop   [id number]   [place] (1204)   1  6.00 LME  Comm Ins   [place]  05/02/2019  1   04/19/2019  Alprazolam 1 MG Tablet  90.00  30 Je Cop   [id number]   Nor (1204)   0  6.00 LME  Private Pay   [place]  04/19/2019  1   04/19/2019  Tramadol Hcl 50 MG Tablet  30.00  30 Je Cop   [id number]   [place] (1204)   0  5.00 MME  Private Pay   [place]  04/04/2019  1   01/09/2019  Alprazolam 1 MG Tablet  90.00  30 Je Cop   [id number]   [place] (1204)   3  6.00 LME  Private Pay   [place]  03/08/2019  1   03/08/2019  Tramadol Hcl 50 MG Tablet  15.00  5 Je Cop   [id number]   [place] (1204)   0  15.00 MME  Private Pay   [place]  03/07/2019  1   01/09/2019  Alprazolam 1 MG Tablet  90.00  30 Je Cop   [id number]   [place] (1204)   2  6.00 LME  Private Pay   [place]  02/13/2019  1   02/13/2019  Tramadol Hcl 50 MG Tablet  15.00  5 Je Cop   [id number]   [place] (1204)   0  15.00 MME  Private      He has 2 alprazolam left.  Advised him that we do not want him to run out of alprazolam and had to stop it suddenly.  I will send in a refill for him with a note to the pharmacy that it may be filled now, however we need him to decrease to taking 1 pill 3 times a day as prescribed.  He agrees.  He will let me know if pharmacy is not able to refill early

## 2019-06-19 NOTE — Telephone Encounter (Signed)
Patient called in to let Dr. Lorelei Pont know that the following medication is not agreeingwith him at this time PARoxetine (PAXIL) 20 MG tablet [id number]     Per the patient he would like either the doctor or the nurse to give him a call back at [phone] as soon as possible.

## 2019-06-21 ENCOUNTER — Other Ambulatory Visit: Payer: Self-pay

## 2019-06-21 ENCOUNTER — Inpatient Hospital Stay (HOSPITAL_BASED_OUTPATIENT_CLINIC_OR_DEPARTMENT_OTHER): Payer: PRIVATE HEALTH INSURANCE | Admitting: Family

## 2019-06-21 ENCOUNTER — Telehealth: Payer: Self-pay | Admitting: Family

## 2019-06-21 ENCOUNTER — Inpatient Hospital Stay: Payer: PRIVATE HEALTH INSURANCE

## 2019-06-21 ENCOUNTER — Encounter: Payer: Self-pay | Admitting: Family

## 2019-06-21 VITALS — BP 125/81 | HR 77 | Temp 98.8°F | Resp 18 | Wt 182.2 lb

## 2019-06-21 DIAGNOSIS — D751 Secondary polycythemia: Secondary | ICD-10-CM

## 2019-06-21 DIAGNOSIS — Z7982 Long term (current) use of aspirin: Secondary | ICD-10-CM | POA: Diagnosis not present

## 2019-06-21 DIAGNOSIS — D500 Iron deficiency anemia secondary to blood loss (chronic): Secondary | ICD-10-CM

## 2019-06-21 LAB — CBC WITH DIFFERENTIAL (CANCER CENTER ONLY)
Abs Immature Granulocytes: 0.03 10*3/uL (ref 0.00–0.07)
Basophils Absolute: 0 10*3/uL (ref 0.0–0.1)
Basophils Relative: 0 %
Eosinophils Absolute: 0.1 10*3/uL (ref 0.0–0.5)
Eosinophils Relative: 1 %
HCT: 41.1 % (ref 39.0–52.0)
Hemoglobin: 13.2 g/dL (ref 13.0–17.0)
Immature Granulocytes: 0 %
Lymphocytes Relative: 24 %
Lymphs Abs: 2.4 10*3/uL (ref 0.7–4.0)
MCH: 26.4 pg (ref 26.0–34.0)
MCHC: 32.1 g/dL (ref 30.0–36.0)
MCV: 82.2 fL (ref 80.0–100.0)
Monocytes Absolute: 0.7 10*3/uL (ref 0.1–1.0)
Monocytes Relative: 7 %
Neutro Abs: 6.9 10*3/uL (ref 1.7–7.7)
Neutrophils Relative %: 68 %
Platelet Count: 265 10*3/uL (ref 150–400)
RBC: 5 MIL/uL (ref 4.22–5.81)
RDW: 16 % — ABNORMAL HIGH (ref 11.5–15.5)
WBC Count: 10.1 10*3/uL (ref 4.0–10.5)
nRBC: 0 % (ref 0.0–0.2)

## 2019-06-21 LAB — CMP (CANCER CENTER ONLY)
ALT: 11 U/L (ref 0–44)
AST: 14 U/L — ABNORMAL LOW (ref 15–41)
Albumin: 4.4 g/dL (ref 3.5–5.0)
Alkaline Phosphatase: 45 U/L (ref 38–126)
Anion gap: 6 (ref 5–15)
BUN: 14 mg/dL (ref 6–20)
CO2: 29 mmol/L (ref 22–32)
Calcium: 9.1 mg/dL (ref 8.9–10.3)
Chloride: 103 mmol/L (ref 98–111)
Creatinine: 0.89 mg/dL (ref 0.61–1.24)
GFR, Est AFR Am: >60 mL/min (ref >60)
GFR, Estimated: >60 mL/min (ref >60)
Glucose, Bld: 96 mg/dL (ref 70–99)
Potassium: 4.1 mmol/L (ref 3.5–5.1)
Sodium: 138 mmol/L (ref 135–145)
Total Bilirubin: 0.3 mg/dL (ref 0.3–1.2)
Total Protein: 7 g/dL (ref 6.5–8.1)

## 2019-06-21 NOTE — Telephone Encounter (Signed)
Appointments scheduled calendar printed &  mailed per 5/26 los °

## 2019-06-21 NOTE — Progress Notes (Signed)
Hematology and Oncology Follow Up Visit  Stuart Hill [id number] May 03, 1970 49 y.o. 06/21/2019   Principle Diagnosis:  Secondary polycythemia JAK2 (-), smoker  LEFT Bell's palsy  Current Therapy:        Phlebotomy to maintain hematocrit below 42% 2 baby aspirin daily at bedtime   Interim History:  Stuart Hill is here today for follow-up. Unfortunately he is under a great deal of stress with his wife's recent suicide attempt causing him to feel anxious and depressed. Both he and his wife are in counseling and he is followed closely by his PCP.  No fever, chills, n/v, cough, rash, dizziness, SOB, chest pain, palpitations, abdominal pain or changes in bowel or bladder habits.  No episodes of bleeding. No bruising or petechiae.  No swelling, tenderness, numbness or tingling in his extremities.  No falls or syncope.  His appetite comes and goes. He is hydrating. His weight is stable.   ECOG Performance Status: 0 - Asymptomatic  Medications:  Allergies as of 06/21/2019      Reactions   Plavix [clopidogrel Bisulfate] Other (See Comments)   Confusion, lethargy, slurred speech   Afrin [nasal Spray] Hives   Antihistamines, Chlorpheniramine-type Hives   Benadryl [diphenhydramine Hcl] Hives   Buprenorphine Hcl Hives   Carisoprodol Hives   Codeine Hives   Metoclopramide Hives   Morphine And Related Hives   Oxymetazoline Hives   Pheniramine Hives, Rash      Medication List       Accurate as of Jun 21, 2019  3:10 PM. If you have any questions, ask your nurse or doctor.        ALPRAZolam 1 MG tablet Commonly known as: XANAX Take 1 TID as needed for anxiety   meloxicam 15 MG tablet Commonly known as: MOBIC Take 1/2 or 1 daily -Use as needed for joint pain   mirtazapine 15 MG tablet Commonly known as: Remeron Take 1 tablet (15 mg total) by mouth at bedtime. Increase to 30 mg after 1 week if needed   sildenafil 20 MG tablet Commonly known as: REVATIO TAKE 1-3 TABLETS  BY MOUTH PRIOR TO SEXUAL ACTIVITY AS NEEDED       Allergies:  Allergies  Allergen Reactions  . Plavix [Clopidogrel Bisulfate] Other (See Comments)    Confusion, lethargy, slurred speech  . Afrin [Nasal Spray] Hives  . Antihistamines, Chlorpheniramine-Type Hives  . Benadryl [Diphenhydramine Hcl] Hives  . Buprenorphine Hcl Hives  . Carisoprodol Hives  . Codeine Hives  . Metoclopramide Hives  . Morphine And Related Hives  . Oxymetazoline Hives  . Pheniramine Hives and Rash    Past Medical History, Surgical history, Social history, and Family History were reviewed and updated.  Review of Systems: All other 10 point review of systems is negative.   Physical Exam:  weight is 182 lb 4 oz (82.7 kg). His temporal temperature is 98.8 F (37.1 C). His blood pressure is 125/81 and his pulse is 77. His respiration is 18 and oxygen saturation is 100%.   Wt Readings from Last 3 Encounters:  06/21/19 182 lb 4 oz (82.7 kg)  06/15/19 179 lb (81.2 kg)  04/19/19 185 lb (83.9 kg)    Ocular: Sclerae unicteric, pupils equal, round and reactive to light Ear-nose-throat: Oropharynx clear, dentition fair Lymphatic: No cervical or supraclavicular adenopathy Lungs no rales or rhonchi, good excursion bilaterally Heart regular rate and rhythm, no murmur appreciated Abd soft, nontender, positive bowel sounds, no liver or spleen tip palpated on exam, no fluid  wave  MSK no focal spinal tenderness, no joint edema Neuro: non-focal, well-oriented, appropriate affect Breasts: Deferred   Lab Results  Component Value Date   WBC 10.1 06/21/2019   HGB 13.2 06/21/2019   HCT 41.1 06/21/2019   MCV 82.2 06/21/2019   PLT 265 06/21/2019   Lab Results  Component Value Date   FERRITIN 8 (L) 05/12/2019   IRON 19 (L) 05/12/2019   TIBC 499 (H) 05/12/2019   UIBC 480 (H) 05/12/2019   IRONPCTSAT 4 (L) 05/12/2019   Lab Results  Component Value Date   RETICCTPCT 1.0 12/20/2017   RBC 5.00 06/21/2019   No  results found for: KPAFRELGTCHN, LAMBDASER, KAPLAMBRATIO No results found for: IGGSERUM, IGA, IGMSERUM No results found for: Kathrynn Ducking, MSPIKE, SPEI   Chemistry      Component Value Date/Time   NA 138 06/21/2019 1416   NA 143 01/01/2017 1453   NA 139 11/12/2015 0945   K 4.1 06/21/2019 1416   K 4.3 01/01/2017 1453   K 3.7 11/12/2015 0945   CL 103 06/21/2019 1416   CL 103 01/01/2017 1453   CO2 29 06/21/2019 1416   CO2 27 01/01/2017 1453   CO2 22 11/12/2015 0945   BUN 14 06/21/2019 1416   BUN 18 01/01/2017 1453   BUN 13.1 11/12/2015 0945   CREATININE 0.89 06/21/2019 1416   CREATININE 1.0 01/01/2017 1453   CREATININE 0.9 11/12/2015 0945      Component Value Date/Time   CALCIUM 9.1 06/21/2019 1416   CALCIUM 8.8 01/01/2017 1453   CALCIUM 8.9 11/12/2015 0945   ALKPHOS 45 06/21/2019 1416   ALKPHOS 44 01/01/2017 1453   ALKPHOS 66 11/12/2015 0945   AST 14 (L) 06/21/2019 1416   AST 13 11/12/2015 0945   ALT 11 06/21/2019 1416   ALT 368 (HH) 01/01/2017 1453   ALT 14 11/12/2015 0945   BILITOT 0.3 06/21/2019 1416   BILITOT <0.22 11/12/2015 0945       Impression and Plan: Stuart Hill is a 49 yo caucasian male with history of secondary polycythemia, JAK2 negative. No phlebotomy needed this visit for Hct 41.1%.  We will see him back in another month for lab/phlebotomy only and then follow-up in 2 months.  He will contact our office with any questions or concerns. We can certainly see him sooner if needed.  [name], NP 5/26/20213:10 PM

## 2019-06-22 LAB — FERRITIN: Ferritin: 9 ng/mL — ABNORMAL LOW (ref 24–336)

## 2019-06-22 LAB — IRON AND TIBC
Iron: 64 ug/dL (ref 42–163)
Saturation Ratios: 15 % — ABNORMAL LOW (ref 20–55)
TIBC: 438 ug/dL — ABNORMAL HIGH (ref 202–409)
UIBC: 374 ug/dL (ref 117–376)

## 2019-07-24 ENCOUNTER — Other Ambulatory Visit: Payer: Self-pay

## 2019-07-24 ENCOUNTER — Inpatient Hospital Stay: Payer: PRIVATE HEALTH INSURANCE

## 2019-07-24 ENCOUNTER — Inpatient Hospital Stay: Payer: PRIVATE HEALTH INSURANCE | Attending: Family

## 2019-07-24 VITALS — BP 122/72 | HR 61 | Temp 98.4°F | Resp 17

## 2019-07-24 DIAGNOSIS — D751 Secondary polycythemia: Secondary | ICD-10-CM | POA: Insufficient documentation

## 2019-07-24 DIAGNOSIS — D500 Iron deficiency anemia secondary to blood loss (chronic): Secondary | ICD-10-CM

## 2019-07-24 LAB — CMP (CANCER CENTER ONLY)
ALT: 13 U/L (ref 0–44)
AST: 15 U/L (ref 15–41)
Albumin: 4.2 g/dL (ref 3.5–5.0)
Alkaline Phosphatase: 42 U/L (ref 38–126)
Anion gap: 7 (ref 5–15)
BUN: 11 mg/dL (ref 6–20)
CO2: 29 mmol/L (ref 22–32)
Calcium: 9.2 mg/dL (ref 8.9–10.3)
Chloride: 104 mmol/L (ref 98–111)
Creatinine: 0.95 mg/dL (ref 0.61–1.24)
GFR, Est AFR Am: >60 mL/min (ref >60)
GFR, Estimated: >60 mL/min (ref >60)
Glucose, Bld: 107 mg/dL — ABNORMAL HIGH (ref 70–99)
Potassium: 4.1 mmol/L (ref 3.5–5.1)
Sodium: 140 mmol/L (ref 135–145)
Total Bilirubin: 0.3 mg/dL (ref 0.3–1.2)
Total Protein: 7 g/dL (ref 6.5–8.1)

## 2019-07-24 LAB — CBC WITH DIFFERENTIAL (CANCER CENTER ONLY)
Abs Immature Granulocytes: 0.02 10*3/uL (ref 0.00–0.07)
Basophils Absolute: 0.1 10*3/uL (ref 0.0–0.1)
Basophils Relative: 1 %
Eosinophils Absolute: 0.2 10*3/uL (ref 0.0–0.5)
Eosinophils Relative: 1 %
HCT: 45.1 % (ref 39.0–52.0)
Hemoglobin: 14.5 g/dL (ref 13.0–17.0)
Immature Granulocytes: 0 %
Lymphocytes Relative: 23 %
Lymphs Abs: 2.8 10*3/uL (ref 0.7–4.0)
MCH: 27.2 pg (ref 26.0–34.0)
MCHC: 32.2 g/dL (ref 30.0–36.0)
MCV: 84.6 fL (ref 80.0–100.0)
Monocytes Absolute: 0.7 10*3/uL (ref 0.1–1.0)
Monocytes Relative: 6 %
Neutro Abs: 8.3 10*3/uL — ABNORMAL HIGH (ref 1.7–7.7)
Neutrophils Relative %: 69 %
Platelet Count: 228 10*3/uL (ref 150–400)
RBC: 5.33 MIL/uL (ref 4.22–5.81)
RDW: 18.3 % — ABNORMAL HIGH (ref 11.5–15.5)
WBC Count: 12 10*3/uL — ABNORMAL HIGH (ref 4.0–10.5)
nRBC: 0 % (ref 0.0–0.2)

## 2019-07-24 NOTE — Progress Notes (Signed)
Stuart Hill presents today for phlebotomy per MD orders. Phlebotomy procedure started at 1406 and ended at 1414. 545 cc removed via 16 gauge needle to right AC. Patient tolerated procedure well. Pt declined to stay for post procedure observation period. Pt stated he has tolerated procedure before without difficulty. Pt denies any dizziness. Pt aware to call clinic with any questions or concerns. Pt verbalized understanding and had no further questions.

## 2019-07-25 LAB — IRON AND TIBC
Iron: 61 ug/dL (ref 42–163)
Saturation Ratios: 14 % — ABNORMAL LOW (ref 20–55)
TIBC: 426 ug/dL — ABNORMAL HIGH (ref 202–409)
UIBC: 365 ug/dL (ref 117–376)

## 2019-07-25 LAB — FERRITIN: Ferritin: 10 ng/mL — ABNORMAL LOW (ref 24–336)

## 2019-08-18 ENCOUNTER — Inpatient Hospital Stay: Payer: PRIVATE HEALTH INSURANCE | Admitting: Family

## 2019-08-18 ENCOUNTER — Inpatient Hospital Stay: Payer: PRIVATE HEALTH INSURANCE | Attending: Family

## 2019-08-18 ENCOUNTER — Inpatient Hospital Stay: Payer: PRIVATE HEALTH INSURANCE

## 2019-08-22 ENCOUNTER — Inpatient Hospital Stay: Payer: PRIVATE HEALTH INSURANCE | Admitting: Family

## 2019-08-22 ENCOUNTER — Inpatient Hospital Stay: Payer: PRIVATE HEALTH INSURANCE

## 2019-08-25 ENCOUNTER — Other Ambulatory Visit: Payer: PRIVATE HEALTH INSURANCE

## 2019-08-25 ENCOUNTER — Ambulatory Visit: Payer: PRIVATE HEALTH INSURANCE | Admitting: Family

## 2019-09-11 ENCOUNTER — Inpatient Hospital Stay: Payer: PRIVATE HEALTH INSURANCE

## 2019-09-11 ENCOUNTER — Telehealth: Payer: Self-pay | Admitting: Family

## 2019-09-11 ENCOUNTER — Encounter: Payer: Self-pay | Admitting: Family

## 2019-09-11 ENCOUNTER — Other Ambulatory Visit: Payer: Self-pay

## 2019-09-11 ENCOUNTER — Inpatient Hospital Stay: Payer: PRIVATE HEALTH INSURANCE | Attending: Family | Admitting: Family

## 2019-09-11 VITALS — BP 117/73 | HR 57 | Temp 97.9°F | Resp 18 | Ht 73.0 in | Wt 184.1 lb

## 2019-09-11 VITALS — BP 126/76 | HR 58 | Resp 17

## 2019-09-11 DIAGNOSIS — D751 Secondary polycythemia: Secondary | ICD-10-CM

## 2019-09-11 DIAGNOSIS — F17210 Nicotine dependence, cigarettes, uncomplicated: Secondary | ICD-10-CM | POA: Insufficient documentation

## 2019-09-11 DIAGNOSIS — D500 Iron deficiency anemia secondary to blood loss (chronic): Secondary | ICD-10-CM

## 2019-09-11 LAB — CBC WITH DIFFERENTIAL (CANCER CENTER ONLY)
Abs Immature Granulocytes: 0.04 10*3/uL (ref 0.00–0.07)
Basophils Absolute: 0.1 10*3/uL (ref 0.0–0.1)
Basophils Relative: 1 %
Eosinophils Absolute: 0.1 10*3/uL (ref 0.0–0.5)
Eosinophils Relative: 1 %
HCT: 45.4 % (ref 39.0–52.0)
Hemoglobin: 14.8 g/dL (ref 13.0–17.0)
Immature Granulocytes: 0 %
Lymphocytes Relative: 16 %
Lymphs Abs: 1.6 10*3/uL (ref 0.7–4.0)
MCH: 28.5 pg (ref 26.0–34.0)
MCHC: 32.6 g/dL (ref 30.0–36.0)
MCV: 87.3 fL (ref 80.0–100.0)
Monocytes Absolute: 0.9 10*3/uL (ref 0.1–1.0)
Monocytes Relative: 9 %
Neutro Abs: 7.4 10*3/uL (ref 1.7–7.7)
Neutrophils Relative %: 73 %
Platelet Count: 243 10*3/uL (ref 150–400)
RBC: 5.2 MIL/uL (ref 4.22–5.81)
RDW: 16.1 % — ABNORMAL HIGH (ref 11.5–15.5)
WBC Count: 10 10*3/uL (ref 4.0–10.5)
nRBC: 0 % (ref 0.0–0.2)

## 2019-09-11 LAB — CMP (CANCER CENTER ONLY)
ALT: 13 U/L (ref 0–44)
AST: 14 U/L — ABNORMAL LOW (ref 15–41)
Albumin: 4.4 g/dL (ref 3.5–5.0)
Alkaline Phosphatase: 47 U/L (ref 38–126)
Anion gap: 6 (ref 5–15)
BUN: 12 mg/dL (ref 6–20)
CO2: 30 mmol/L (ref 22–32)
Calcium: 9.5 mg/dL (ref 8.9–10.3)
Chloride: 103 mmol/L (ref 98–111)
Creatinine: 0.93 mg/dL (ref 0.61–1.24)
GFR, Est AFR Am: >60 mL/min (ref >60)
GFR, Estimated: >60 mL/min (ref >60)
Glucose, Bld: 58 mg/dL — ABNORMAL LOW (ref 70–99)
Potassium: 4.2 mmol/L (ref 3.5–5.1)
Sodium: 139 mmol/L (ref 135–145)
Total Bilirubin: 0.4 mg/dL (ref 0.3–1.2)
Total Protein: 6.8 g/dL (ref 6.5–8.1)

## 2019-09-11 NOTE — Patient Instructions (Signed)
Therapeutic Phlebotomy Therapeutic phlebotomy is the planned removal of blood from a person's body for the purpose of treating a medical condition. The procedure is similar to donating blood. Usually, about a pint (470 mL, or 0.47 L) of blood is removed. The average adult has 9-12 pints (4.3-5.7 L) of blood in the body. Therapeutic phlebotomy may be used to treat the following medical conditions:  Hemochromatosis. This is a condition in which the blood contains too much iron.  Polycythemia vera. This is a condition in which the blood contains too many red blood cells.  Porphyria cutanea tarda. This is a disease in which an important part of hemoglobin is not made properly. It results in the buildup of abnormal amounts of porphyrins in the body.  Sickle cell disease. This is a condition in which the red blood cells form an abnormal crescent shape rather than a round shape. Tell a health care provider about:  Any allergies you have.  All medicines you are taking, including vitamins, herbs, eye drops, creams, and over-the-counter medicines.  Any problems you or family members have had with anesthetic medicines.  Any blood disorders you have.  Any surgeries you have had.  Any medical conditions you have.  Whether you are pregnant or may be pregnant. What are the risks? Generally, this is a safe procedure. However, problems may occur, including:  Nausea or light-headedness.  Low blood pressure (hypotension).  Soreness, bleeding, swelling, or bruising at the needle insertion site.  Infection. What happens before the procedure?  Follow instructions from your health care provider about eating or drinking restrictions.  Ask your health care provider about: ? Changing or stopping your regular medicines. This is especially important if you are taking diabetes medicines or blood thinners (anticoagulants). ? Taking medicines such as aspirin and ibuprofen. These medicines can thin your  blood. Do not take these medicines unless your health care provider tells you to take them. ? Taking over-the-counter medicines, vitamins, herbs, and supplements.  Wear clothing with sleeves that can be raised above the elbow.  Plan to have someone take you home from the hospital or clinic.  You may have a blood sample taken.  Your blood pressure, pulse rate, and breathing rate will be measured. What happens during the procedure?   To lower your risk of infection: ? Your health care team will wash or sanitize their hands. ? Your skin will be cleaned with an antiseptic.  You may be given a medicine to numb the area (local anesthetic).  A tourniquet will be placed on your arm.  A needle will be inserted into one of your veins.  Tubing and a collection bag will be attached to that needle.  Blood will flow through the needle and tubing into the collection bag.  The collection bag will be placed lower than your arm to allow gravity to help the flow of blood into the bag.  You may be asked to open and close your hand slowly and continually during the entire collection.  After the specified amount of blood has been removed from your body, the collection bag and tubing will be clamped.  The needle will be removed from your vein.  Pressure will be held on the site of the needle insertion to stop the bleeding.  A bandage (dressing) will be placed over the needle insertion site. The procedure may vary among health care providers and hospitals. What happens after the procedure?  Your blood pressure, pulse rate, and breathing rate will be   measured after the procedure.  You will be encouraged to drink fluids.  Your recovery will be assessed and monitored.  You can return to your normal activities as told by your health care provider. Summary  Therapeutic phlebotomy is the planned removal of blood from a person's body for the purpose of treating a medical condition.  Therapeutic  phlebotomy may be used to treat hemochromatosis, polycythemia vera, porphyria cutanea tarda, or sickle cell disease.  In the procedure, a needle is inserted and about a pint (470 mL, or 0.47 L) of blood is removed. The average adult has 9-12 pints (4.3-5.7 L) of blood in the body.  This is generally a safe procedure, but it can sometimes cause problems such as nausea, light-headedness, or low blood pressure (hypotension). This information is not intended to replace advice given to you by your health care provider. Make sure you discuss any questions you have with your health care provider. Document Revised: 01/28/2017 Document Reviewed: 01/28/2017 Elsevier Patient Education  2020 [employer].  

## 2019-09-11 NOTE — Progress Notes (Signed)
Stuart Hill presents today for phlebotomy per MD orders. Phlebotomy procedure started at 1338 and ended at 1342. 270 grams removed via 16 gauge needle to RAC without difficulty. Per Princella Pellegrini, NP only remove 250 cc. Patient tolerated procedure well. IV needle removed intact.  Pt declined to stay for post phlebotomy observation period. Pt stated he has tolerated procedure multiple times prior without difficulty. Pt aware to call clinic with any questions or concerns. Pt verbalized understanding and had no further questions.

## 2019-09-11 NOTE — Telephone Encounter (Signed)
Appointments scheduled calendar printed per 8/16 los 

## 2019-09-11 NOTE — Progress Notes (Signed)
Hematology and Oncology Follow Up Visit  Stuart Hill [id number] [birth year] 49 y.o. 09/11/2019   Principle Diagnosis:  Secondary polycythemia JAK2 (-), smoker  LEFT Bell's palsy  Current Therapy: Phlebotomy to maintain hematocrit below 45% 2 baby aspirin daily at bedtime - not taking per patient    Interim History:  Stuart Hill is here today for follow-up. Hct is 45.4. He would prefer to have a partial phlebotomy (250 ml).  He is still fatigued at times. He has started working out a little again.  He has removed a lot of the unhealthy foods he was eating from his diet. He is doing his best to stay well hydrated. His weight is stable at 184 lbs. He has mild SOB after smoking. He uses an inhaler as needed.   He still sees his counselor as needed and states that his wife is feeling much better. We are so happy to hear this.  He stopped the baby aspirin because her was having hives induced by stress and was scratching his skin until it bled.  No fever, chills, n/v, cough, rash, dizziness, chest pain, palpitations, abdominal pain or changes in bowel or bladder habits.  No episodes of bleeding. No bruising or petechiae.  No falls or syncope.   ECOG Performance Status: 1 - Symptomatic but completely ambulatory  Medications:  Allergies as of 09/11/2019      Reactions   Plavix [clopidogrel Bisulfate] Other (See Comments)   Confusion, lethargy, slurred speech   Afrin [nasal Spray] Hives   Antihistamines, Chlorpheniramine-type Hives   Benadryl [diphenhydramine Hcl] Hives   Buprenorphine Hcl Hives   Carisoprodol Hives   Codeine Hives   Metoclopramide Hives   Morphine And Related Hives   Oxymetazoline Hives   Pheniramine Hives, Rash      Medication List       Accurate as of September 11, 2019  1:08 PM. If you have any questions, ask your nurse or doctor.        ALPRAZolam 1 MG tablet Commonly known as: XANAX Take 1 TID as needed for anxiety   meloxicam 15 MG  tablet Commonly known as: MOBIC Take 1/2 or 1 daily -Use as needed for joint pain   mirtazapine 15 MG tablet Commonly known as: Remeron Take 1 tablet (15 mg total) by mouth at bedtime. Increase to 30 mg after 1 week if needed   sildenafil 20 MG tablet Commonly known as: REVATIO TAKE 1-3 TABLETS BY MOUTH PRIOR TO SEXUAL ACTIVITY AS NEEDED       Allergies:  Allergies  Allergen Reactions   Plavix [Clopidogrel Bisulfate] Other (See Comments)    Confusion, lethargy, slurred speech   Afrin [Nasal Spray] Hives   Antihistamines, Chlorpheniramine-Type Hives   Benadryl [Diphenhydramine Hcl] Hives   Buprenorphine Hcl Hives   Carisoprodol Hives   Codeine Hives   Metoclopramide Hives   Morphine And Related Hives   Oxymetazoline Hives   Pheniramine Hives and Rash    Past Medical History, Surgical history, Social history, and Family History were reviewed and updated.  Review of Systems: All other 10 point review of systems is negative.   Physical Exam:  height is 6\' 1"  (1.854 m) and weight is 184 lb 1.3 oz (83.5 kg). His oral temperature is 97.9 F (36.6 C). His blood pressure is 117/73 and his pulse is 57 (abnormal). His respiration is 18 and oxygen saturation is 100%.   Wt Readings from Last 3 Encounters:  09/11/19 184 lb 1.3 oz (83.5 kg)  06/21/19 182 lb 4 oz (82.7 kg)  06/15/19 179 lb (81.2 kg)    Ocular: Sclerae unicteric, pupils equal, round and reactive to light Ear-nose-throat: Oropharynx clear, dentition fair Lymphatic: No cervical or supraclavicular adenopathy Lungs no rales or rhonchi, good excursion bilaterally Heart regular rate and rhythm, no murmur appreciated Abd soft, nontender, positive bowel sounds, no liver or spleen tip palpated on exam, no fluid wave  MSK no focal spinal tenderness, no joint edema Neuro: non-focal, well-oriented, appropriate affect Breasts: Deferred   Lab Results  Component Value Date   WBC 10.0 09/11/2019   HGB 14.8  09/11/2019   HCT 45.4 09/11/2019   MCV 87.3 09/11/2019   PLT 243 09/11/2019   Lab Results  Component Value Date   FERRITIN 10 (L) 07/24/2019   IRON 61 07/24/2019   TIBC 426 (H) 07/24/2019   UIBC 365 07/24/2019   IRONPCTSAT 14 (L) 07/24/2019   Lab Results  Component Value Date   RETICCTPCT 1.0 12/20/2017   RBC 5.20 09/11/2019   No results found for: KPAFRELGTCHN, LAMBDASER, KAPLAMBRATIO No results found for: IGGSERUM, IGA, IGMSERUM No results found for: Odetta Pink, SPEI   Chemistry      Component Value Date/Time   NA 140 07/24/2019 1345   NA 143 01/01/2017 1453   NA 139 11/12/2015 0945   K 4.1 07/24/2019 1345   K 4.3 01/01/2017 1453   K 3.7 11/12/2015 0945   CL 104 07/24/2019 1345   CL 103 01/01/2017 1453   CO2 29 07/24/2019 1345   CO2 27 01/01/2017 1453   CO2 22 11/12/2015 0945   BUN 11 07/24/2019 1345   BUN 18 01/01/2017 1453   BUN 13.1 11/12/2015 0945   CREATININE 0.95 07/24/2019 1345   CREATININE 1.0 01/01/2017 1453   CREATININE 0.9 11/12/2015 0945      Component Value Date/Time   CALCIUM 9.2 07/24/2019 1345   CALCIUM 8.8 01/01/2017 1453   CALCIUM 8.9 11/12/2015 0945   ALKPHOS 42 07/24/2019 1345   ALKPHOS 44 01/01/2017 1453   ALKPHOS 66 11/12/2015 0945   AST 15 07/24/2019 1345   AST 13 11/12/2015 0945   ALT 13 07/24/2019 1345   ALT 368 (HH) 01/01/2017 1453   ALT 14 11/12/2015 0945   BILITOT 0.3 07/24/2019 1345   BILITOT <0.22 11/12/2015 0945       Impression and Plan: Stuart Hill is a 49 yo caucasian male with history of secondary polycythemia, JAK2 negative, smoker. His iron is chronically low with phlebotomies. We will increase his Hct threshold to 45% at this time so as not to cause hypoxia.  We will proceed with partial phlebotomy today.  We will plan to see him again in 1 month.  He has maintained a good appetite and is staying well hydrated. His weight is stable.   [name],  NP 8/16/20211:08 PM

## 2019-09-12 LAB — IRON AND TIBC
Iron: 36 ug/dL — ABNORMAL LOW (ref 42–163)
Saturation Ratios: 8 % — ABNORMAL LOW (ref 20–55)
TIBC: 460 ug/dL — ABNORMAL HIGH (ref 202–409)
UIBC: 425 ug/dL — ABNORMAL HIGH (ref 117–376)

## 2019-09-12 LAB — FERRITIN: Ferritin: 8 ng/mL — ABNORMAL LOW (ref 24–336)

## 2019-10-03 ENCOUNTER — Telehealth: Payer: Self-pay

## 2019-10-03 NOTE — Telephone Encounter (Signed)
Hi- I have 2 to work in tomorrow 9/8 already- can we see if he can come on on Thursday in one of my blocked times? [place]

## 2019-10-03 NOTE — Telephone Encounter (Signed)
Patient called with c/o SOB, fatigue and chest tightness x 6 days.  Patient was evaluated at [facility] on Saturday (09/30/19), treated with steroids and inhaler. Tested negative for flu and COVID at that time.  Patient denies chest pain, diaphoresis, dizziness or arm pain. He states he has h/o lung nodules and is concerned. Typically an active person, however exhaust easily.   Patient requesting to be seen by PCP, refuses ER unless symptoms worsen.

## 2019-10-04 NOTE — Telephone Encounter (Signed)
Pt called. LVM for patient to call back to schedule a work in appt with [place] tomorrow.

## 2019-10-04 NOTE — Telephone Encounter (Signed)
Patient has been scheduled to see Dr. Lorelei Pont tomorrow.

## 2019-10-05 ENCOUNTER — Ambulatory Visit
Admission: RE | Admit: 2019-10-05 | Discharge: 2019-10-05 | Disposition: A | Discharge status: Home / Self Care | Payer: PRIVATE HEALTH INSURANCE | Source: Ambulatory Visit | Attending: Family Medicine | Admitting: Family Medicine

## 2019-10-05 ENCOUNTER — Other Ambulatory Visit: Payer: Self-pay

## 2019-10-05 ENCOUNTER — Encounter: Payer: Self-pay | Admitting: Family Medicine

## 2019-10-05 ENCOUNTER — Ambulatory Visit (INDEPENDENT_AMBULATORY_CARE_PROVIDER_SITE_OTHER): Payer: PRIVATE HEALTH INSURANCE | Admitting: Family Medicine

## 2019-10-05 VITALS — BP 122/70 | HR 60 | Resp 16 | Ht 73.0 in | Wt 182.0 lb

## 2019-10-05 DIAGNOSIS — R0602 Shortness of breath: Secondary | ICD-10-CM

## 2019-10-05 DIAGNOSIS — D45 Polycythemia vera: Secondary | ICD-10-CM | POA: Diagnosis not present

## 2019-10-05 DIAGNOSIS — F411 Generalized anxiety disorder: Secondary | ICD-10-CM

## 2019-10-05 LAB — D-DIMER, QUANTITATIVE: D-Dimer, Quant: <0.19 mcg/mL FEU (ref <0.50)

## 2019-10-05 LAB — TROPONIN I: Troponin I: <3 ng/L (ref <=47)

## 2019-10-05 NOTE — Progress Notes (Addendum)
Stuart Hill at [facility] [address] [place], [place] [address] [phone]  Date:  10/05/2019   Name:  Stuart Hill   DOB:  [birth year]   MRN:  [id number]  PCP:  Stuart Mclean, MD    Chief Complaint: Shortness of Breath (started one week, cough, tested negative, no fever)   History of Present Illness:  Stuart Hill is a 49 y.o. very pleasant male patient who presents with the following:  Patient with history of sleep apnea status post tonsillectomy polycythemia vera and also due to smoking, anxiety Last seen by myself in May of this year-at that time his wife Stuart Hill had recently attempted suicide, of course this is very traumatic for them both.  Thankfully she is doing better In May we started Stuart Hill on Paxil and I encouraged him to establish care with a psychiatrist.  He also does use Xanax for chronic anxiety Shortly after our visit, the patient reported side effects on Paxil, we stopped this medication and changed him to Remeron.  However, this did not agree with him either so he is no longer taking it.  In any case, his anxiety symptoms are currently stable  He saw his oncologist NP, Stuart Hill in mid August-at that time he had a partial phlebotomy, they are trying to keep his hematocrit at 45% as he becomes symptomatic at lower levels  Patient then called [place] on September 7 with concern of shortness of breath, fatigue, chest tightness for about 1 week.  He had been seen at [facility] urgent care on September 4, tested negative for flu and Covid.  He was given steroids and inhaler.  At that time [name] was not willing to go to the ER-we scheduled appointment for today  Pt reports that his chest has felt tight and he got SOB with exertion starting a week ago today-physical tasks that would normally be quite easy seemed difficult Recent covid and flu negative as above.  At urgent care, they gave him pred and albuterol inhaler He feels  better in the am- As he smokes cigs during the day his breathing seems to get a bit worse No wheezing noted No chest pain or pressure currently-he did feel a sensation of chest pressure a few days ago He is getting winded with very easy exertion No fever noted  He does not feel that prednisone or albuterol particularly helpful  He does note a family history of coronary artery disease  Tetanus vaccine Needs hep C and HIV screening Covid series-encouraged him to get this done  09/12/2019  1   06/19/2019  Alprazolam 1 MG Tablet  90.00  30 Stuart Hill   [id number]   [place] (1204)   1/2  6.00 LME  Comm Ins   [place]  08/16/2019  1   04/19/2019  Tramadol Hcl 50 MG Tablet  30.00  30 Stuart Hill   [id number]   [place] (1204)   1/3  5.00 MME  Private Pay   [place]  08/15/2019  1   04/19/2019  Alprazolam 1 MG Tablet  90.00  30 Stuart Hill   [id number]   [place] (1204)   3/3  6.00 LME  Comm Ins   [place]  07/19/2019  1   04/19/2019  Alprazolam 1 MG Tablet  90.00  30 Stuart Hill   [id number]   [place] (1204)   2/3  6.00 LME  Comm Ins     06/19/2019  1  06/19/2019  Alprazolam 1 MG Tablet  90.00  30 Stuart Hill   [id number]   [place] (1204)   0/2  6.00 LME  Private Pay   [place]  05/29/2019  1   04/19/2019  Alprazolam 1 MG Tablet  90.00  30 Stuart Hill   [id number]   [place] (1204)   1/3  6.00 LME  Comm Ins   [place]  05/02/2019  1   04/19/2019  Alprazolam 1 MG Tablet  90.00  30 Stuart Hill   [id number]   [place] (1204)   0/3  6.00 LME  Private Pay   [place]  04/19/2019  1   04/19/2019  Tramadol Hcl 50 MG Tablet  30.00  30 Stuart Hill   [id number]   [place] (1204)   0/3  5.00 MME       Patient Active Problem List   Diagnosis Date Noted  . Tonsillar hypertrophy 04/30/2017  . Obstructive sleep apnea 04/23/2017  . 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 Polycythemia vera ([place]) 05/03/2015  . Hemoptysis 04/03/2015  . Condylomata acuminata 01/18/2015  . Generalized anxiety disorder 03/05/2014    Past Medical History:  Diagnosis Date  . Allergy   . Anxiety   . Arthritis   . Bell's palsy   . Erythrocytosis 04/10/2015  .  Hyperlipidemia   . Polycythemia vera ([place]) 05/03/2015    Past Surgical History:  Procedure Laterality Date  . DENTAL SURGERY    . HAND SURGERY    . TONSILLECTOMY Bilateral 04/30/2017   Procedure: TONSILLECTOMY;  Surgeon: Jerrell Belfast, MD;  Location: [place];  Service: ENT;  Laterality: Bilateral;    Social History   Tobacco Use  . Smoking status: Current Every Day Smoker    Packs/day: 1.00    Years: 24.00    Pack years: 24.00    Types: Cigarettes  . Smokeless tobacco: Never Used  Vaping Use  . Vaping Use: Never used  Substance Use Topics  . Alcohol use: Yes    Alcohol/week: 1.0 standard drink    Types: 1 Cans of beer per week    Comment: 6-10 beer a week  . Drug use: Yes    Types: Other-see comments, Marijuana    Comment: Marijuana every other day.    Family History  Problem Relation Age of Onset  . COPD Mother   . Fibromyalgia Mother   . Heart disease Father   . Stroke Father   . Depression Brother   . Heart disease Brother   . Heart disease Maternal Grandmother   . Stroke Maternal Grandmother   . Heart disease Maternal Grandfather   . Stroke Maternal Grandfather   . Heart disease Paternal Grandmother   . Stroke Paternal Grandmother   . Heart disease Paternal Grandfather   . Stroke Paternal Grandfather   . Stroke Paternal Aunt     Allergies  Allergen Reactions  . Plavix [Clopidogrel Bisulfate] Other (See Comments)    Confusion, lethargy, slurred speech  . Afrin [Nasal Spray] Hives  . Antihistamines, Chlorpheniramine-Type Hives  . Benadryl [Diphenhydramine Hcl] Hives  . Buprenorphine Hcl Hives  . Carisoprodol Hives  . Codeine Hives  . Metoclopramide Hives  . Morphine And Related Hives  . Oxymetazoline Hives  . Pheniramine Hives and Rash    Medication list has been reviewed and updated.  Current Outpatient Medications on File Prior to Visit  Medication Sig Dispense Refill  . albuterol (VENTOLIN HFA) 108 (90 Base) MCG/ACT inhaler  Inhale into the lungs.    Marland Kitchen  ALPRAZolam (XANAX) 1 MG tablet Take 1 TID as needed for anxiety 90 tablet 2  . meloxicam (MOBIC) 15 MG tablet Take 1/2 or 1 daily -Use as needed for joint pain 30 tablet 3  . predniSONE (STERAPRED UNI-PAK 21 TAB) 5 MG (21) TBPK tablet Take by mouth.    . sildenafil (REVATIO) 20 MG tablet TAKE 1-3 TABLETS BY MOUTH PRIOR TO SEXUAL ACTIVITY AS NEEDED 30 tablet 5   No current facility-administered medications on file prior to visit.    Review of Systems:  As per HPI- otherwise negative.   Physical Examination: Vitals:   10/05/19 1001  BP: 122/70  Pulse: 60  Resp: 16  SpO2: 98%   Vitals:   10/05/19 1001  Weight: 182 lb (82.6 kg)  Height: 6\' 1"  (1.854 m)   Body mass index is 24.01 kg/m. Ideal Body Weight: Weight in (lb) to have BMI = 25: 189.1  GEN: no acute distress.  Tall build, normal weight.  Looks well HEENT: Atraumatic, Normocephalic.  Ears and Nose: No external deformity. CV: RRR, No M/G/R. No JVD. No thrill. No extra heart sounds. PULM: CTA B, no wheezes, crackles, rhonchi. No retractions. No resp. distress. No accessory muscle use. ABD: S, NT, ND, +BS. No rebound. No HSM. EXTR: No c/c/e PSYCH: Normally interactive. Conversant.   EKG: Sinus bradycardia, rate 51; per machine, possible anterior septal infarct age undetermined Compared with tracing from 2019 change V2, ow stable.  Given change in only 1 lead suspect this is due to placement Assessment and Plan: SOB (shortness of breath) - Plan: DG Chest 2 View, EKG 12-Lead, Troponin I, D-Dimer, Quantitative, TSH, CBC, Basic metabolic panel, Ambulatory referral to Cardiology, CANCELED: Basic metabolic panel, CANCELED: CBC, CANCELED: TSH  Generalized anxiety disorder  Polycythemia vera ([place])  Patient today with concern of shortness of breath for about 1 week Consider pulmonary embolism given tobacco and polycythemia, also CAD-he is a smoker and notes family history of CAD Symptoms are  subacute at this time, no current chest pain Order stat D-dimer and troponin, other routine labs If D-dimer positive plan for CT angiogram If negative, plan for cardiology evaluation/stress test Patient is asked to alert me or otherwise seek care if getting worse or any changes in the meantime This visit occurred during the SARS-CoV-2 public health emergency.  Safety protocols were in place, including screening questions prior to the visit, additional usage of staff PPE, and extensive cleaning of exam room while observing appropriate contact time as indicated for disinfecting solutions.    Signed Lamar Blinks, MD  Received his labs and chest x-ray as follows-gave patient a call Good news, D-dimer and troponin are both normal However, given his family history and smoking history I am slightly more suspicious he could have CAD-would like to have him see cardiology as opposed to simply ordering a stress test- ?  They might prefer a coronary calcium score Will place an urgent referral to cardiology As patient is starting a baby aspirin daily in the meantime If he is getting worse or has any changes in symptoms he will contact me, go to the ER if urgent Results for orders placed or performed in visit on 10/05/19  Troponin I  Result Value Ref Range   Troponin I <3 < OR = 47 ng/L  D-Dimer, Quantitative  Result Value Ref Range   D-Dimer, Quant <0.19 <0.50 mcg/mL FEU  TSH  Result Value Ref Range   TSH 1.23 0.40 - 4.50 mIU/L  CBC  Result  Value Ref Range   WBC 10.2 3.8 - 10.8 Thousand/uL   RBC 5.24 4.20 - 5.80 Million/uL   Hemoglobin 15.3 13.2 - 17.1 g/dL   HCT 46.4 38 - 50 %   MCV 88.5 80.0 - 100.0 fL   MCH 29.2 27.0 - 33.0 pg   MCHC 33.0 32.0 - 36.0 g/dL   RDW 13.8 11.0 - 15.0 %   Platelets 282 140 - 400 Thousand/uL   MPV 11.3 7.5 - 12.5 fL  Basic metabolic panel  Result Value Ref Range   Glucose, Bld 92 65 - 99 mg/dL   BUN 13 7 - 25 mg/dL   Creat 0.96 0.60 - 1.35 mg/dL    BUN/Creatinine Ratio NOT APPLICABLE 6 - 22 (calc)   Sodium 137 135 - 146 mmol/L   Potassium 4.4 3.5 - 5.3 mmol/L   Chloride 101 98 - 110 mmol/L   CO2 28 20 - 32 mmol/L   Calcium 9.3 8.6 - 10.3 mg/dL   No results found.  Addendum 9/13-received the rest of his labs, also normal Patient has a cardiology appointment scheduled on October 8; I touched base with his cardiologist about stress testing in the meantime.  He recommended an exercise nuclear medicine test Called pt and went over his labs, he is ok with going ahead and ordering stress test He reports that he is feeling better, SOB is improved   Will mail him a letter w labs

## 2019-10-05 NOTE — Addendum Note (Signed)
Addended by: Lamar Blinks C on: 10/05/2019 04:45 PM   Modules accepted: Orders

## 2019-10-05 NOTE — Patient Instructions (Signed)
It was good to see you again today, I will be in touch with your labs and chest x-ray report as soon as possible if your D-dimer is elevated, we will plan to do a CT of your chest to rule out any blood clot in the lung, if the troponin is elevated, we will have you urgently see cardiology  Assuming both of these tests are normal, we will plan to set you up for a stress test to further evaluate your heart if you have any worsening or change of your symptoms in the meantime please let me know.  Otherwise, try to take it easy and smoke as little as you can while we finish your evaluation  I would encourage you to get your COVID-19 series as soon as possible; this is our best tool to prevent hospitalization and death from Covid pneumonia.

## 2019-10-06 LAB — CBC
HCT: 46.4 % (ref 38.5–50.0)
Hemoglobin: 15.3 g/dL (ref 13.2–17.1)
MCH: 29.2 pg (ref 27.0–33.0)
MCHC: 33 g/dL (ref 32.0–36.0)
MCV: 88.5 fL (ref 80.0–100.0)
MPV: 11.3 fL (ref 7.5–12.5)
Platelets: 282 10*3/uL (ref 140–400)
RBC: 5.24 10*6/uL (ref 4.20–5.80)
RDW: 13.8 % (ref 11.0–15.0)
WBC: 10.2 10*3/uL (ref 3.8–10.8)

## 2019-10-06 LAB — BASIC METABOLIC PANEL
BUN: 13 mg/dL (ref 7–25)
CO2: 28 mmol/L (ref 20–32)
Calcium: 9.3 mg/dL (ref 8.6–10.3)
Chloride: 101 mmol/L (ref 98–110)
Creat: 0.96 mg/dL (ref 0.60–1.35)
Glucose, Bld: 92 mg/dL (ref 65–99)
Potassium: 4.4 mmol/L (ref 3.5–5.3)
Sodium: 137 mmol/L (ref 135–146)

## 2019-10-06 LAB — TSH: TSH: 1.23 mIU/L (ref 0.40–4.50)

## 2019-10-09 ENCOUNTER — Encounter: Payer: Self-pay | Admitting: Family Medicine

## 2019-10-09 NOTE — Addendum Note (Signed)
Addended by: Lamar Blinks C on: 10/09/2019 12:49 PM   Modules accepted: Orders

## 2019-10-12 ENCOUNTER — Inpatient Hospital Stay (HOSPITAL_BASED_OUTPATIENT_CLINIC_OR_DEPARTMENT_OTHER): Payer: PRIVATE HEALTH INSURANCE | Admitting: Family

## 2019-10-12 ENCOUNTER — Inpatient Hospital Stay: Payer: PRIVATE HEALTH INSURANCE | Attending: Family

## 2019-10-12 ENCOUNTER — Other Ambulatory Visit: Payer: Self-pay

## 2019-10-12 ENCOUNTER — Encounter: Payer: Self-pay | Admitting: Family

## 2019-10-12 ENCOUNTER — Inpatient Hospital Stay: Payer: PRIVATE HEALTH INSURANCE

## 2019-10-12 ENCOUNTER — Telehealth: Payer: Self-pay | Admitting: Family

## 2019-10-12 VITALS — BP 133/80 | HR 56

## 2019-10-12 VITALS — BP 136/82 | HR 60 | Temp 97.7°F | Resp 16 | Ht 73.0 in | Wt 186.0 lb

## 2019-10-12 DIAGNOSIS — G510 Bell's palsy: Secondary | ICD-10-CM | POA: Insufficient documentation

## 2019-10-12 DIAGNOSIS — D751 Secondary polycythemia: Secondary | ICD-10-CM

## 2019-10-12 DIAGNOSIS — D500 Iron deficiency anemia secondary to blood loss (chronic): Secondary | ICD-10-CM

## 2019-10-12 DIAGNOSIS — F17210 Nicotine dependence, cigarettes, uncomplicated: Secondary | ICD-10-CM | POA: Diagnosis not present

## 2019-10-12 LAB — CMP (CANCER CENTER ONLY)
ALT: 14 U/L (ref 0–44)
AST: 15 U/L (ref 15–41)
Albumin: 4.3 g/dL (ref 3.5–5.0)
Alkaline Phosphatase: 45 U/L (ref 38–126)
Anion gap: 7 (ref 5–15)
BUN: 12 mg/dL (ref 6–20)
CO2: 30 mmol/L (ref 22–32)
Calcium: 9.4 mg/dL (ref 8.9–10.3)
Chloride: 103 mmol/L (ref 98–111)
Creatinine: 0.92 mg/dL (ref 0.61–1.24)
GFR, Est AFR Am: >60 mL/min (ref >60)
GFR, Estimated: >60 mL/min (ref >60)
Glucose, Bld: 93 mg/dL (ref 70–99)
Potassium: 4.4 mmol/L (ref 3.5–5.1)
Sodium: 140 mmol/L (ref 135–145)
Total Bilirubin: 0.5 mg/dL (ref 0.3–1.2)
Total Protein: 7 g/dL (ref 6.5–8.1)

## 2019-10-12 LAB — CBC WITH DIFFERENTIAL (CANCER CENTER ONLY)
Abs Immature Granulocytes: 0.07 10*3/uL (ref 0.00–0.07)
Basophils Absolute: 0.1 10*3/uL (ref 0.0–0.1)
Basophils Relative: 1 %
Eosinophils Absolute: 0 10*3/uL (ref 0.0–0.5)
Eosinophils Relative: 0 %
HCT: 45.3 % (ref 39.0–52.0)
Hemoglobin: 14.9 g/dL (ref 13.0–17.0)
Immature Granulocytes: 1 %
Lymphocytes Relative: 13 %
Lymphs Abs: 1.9 10*3/uL (ref 0.7–4.0)
MCH: 28.9 pg (ref 26.0–34.0)
MCHC: 32.9 g/dL (ref 30.0–36.0)
MCV: 88 fL (ref 80.0–100.0)
Monocytes Absolute: 0.8 10*3/uL (ref 0.1–1.0)
Monocytes Relative: 5 %
Neutro Abs: 12 10*3/uL — ABNORMAL HIGH (ref 1.7–7.7)
Neutrophils Relative %: 80 %
Platelet Count: 302 10*3/uL (ref 150–400)
RBC: 5.15 MIL/uL (ref 4.22–5.81)
RDW: 15 % (ref 11.5–15.5)
WBC Count: 14.9 10*3/uL — ABNORMAL HIGH (ref 4.0–10.5)
nRBC: 0 % (ref 0.0–0.2)

## 2019-10-12 NOTE — Patient Instructions (Signed)
Therapeutic Phlebotomy Therapeutic phlebotomy is the planned removal of blood from a person's body for the purpose of treating a medical condition. The procedure is similar to donating blood. Usually, about a pint (470 mL, or 0.47 L) of blood is removed. The average adult has 9-12 pints (4.3-5.7 L) of blood in the body. Therapeutic phlebotomy may be used to treat the following medical conditions:  Hemochromatosis. This is a condition in which the blood contains too much iron.  Polycythemia vera. This is a condition in which the blood contains too many red blood cells.  Porphyria cutanea tarda. This is a disease in which an important part of hemoglobin is not made properly. It results in the buildup of abnormal amounts of porphyrins in the body.  Sickle cell disease. This is a condition in which the red blood cells form an abnormal crescent shape rather than a round shape. Tell a health care provider about:  Any allergies you have.  All medicines you are taking, including vitamins, herbs, eye drops, creams, and over-the-counter medicines.  Any problems you or family members have had with anesthetic medicines.  Any blood disorders you have.  Any surgeries you have had.  Any medical conditions you have.  Whether you are pregnant or may be pregnant. What are the risks? Generally, this is a safe procedure. However, problems may occur, including:  Nausea or light-headedness.  Low blood pressure (hypotension).  Soreness, bleeding, swelling, or bruising at the needle insertion site.  Infection. What happens before the procedure?  Follow instructions from your health care provider about eating or drinking restrictions.  Ask your health care provider about: ? Changing or stopping your regular medicines. This is especially important if you are taking diabetes medicines or blood thinners (anticoagulants). ? Taking medicines such as aspirin and ibuprofen. These medicines can thin your  blood. Do not take these medicines unless your health care provider tells you to take them. ? Taking over-the-counter medicines, vitamins, herbs, and supplements.  Wear clothing with sleeves that can be raised above the elbow.  Plan to have someone take you home from the hospital or clinic.  You may have a blood sample taken.  Your blood pressure, pulse rate, and breathing rate will be measured. What happens during the procedure?   To lower your risk of infection: ? Your health care team will wash or sanitize their hands. ? Your skin will be cleaned with an antiseptic.  You may be given a medicine to numb the area (local anesthetic).  A tourniquet will be placed on your arm.  A needle will be inserted into one of your veins.  Tubing and a collection bag will be attached to that needle.  Blood will flow through the needle and tubing into the collection bag.  The collection bag will be placed lower than your arm to allow gravity to help the flow of blood into the bag.  You may be asked to open and close your hand slowly and continually during the entire collection.  After the specified amount of blood has been removed from your body, the collection bag and tubing will be clamped.  The needle will be removed from your vein.  Pressure will be held on the site of the needle insertion to stop the bleeding.  A bandage (dressing) will be placed over the needle insertion site. The procedure may vary among health care providers and hospitals. What happens after the procedure?  Your blood pressure, pulse rate, and breathing rate will be   measured after the procedure.  You will be encouraged to drink fluids.  Your recovery will be assessed and monitored.  You can return to your normal activities as told by your health care provider. Summary  Therapeutic phlebotomy is the planned removal of blood from a person's body for the purpose of treating a medical condition.  Therapeutic  phlebotomy may be used to treat hemochromatosis, polycythemia vera, porphyria cutanea tarda, or sickle cell disease.  In the procedure, a needle is inserted and about a pint (470 mL, or 0.47 L) of blood is removed. The average adult has 9-12 pints (4.3-5.7 L) of blood in the body.  This is generally a safe procedure, but it can sometimes cause problems such as nausea, light-headedness, or low blood pressure (hypotension). This information is not intended to replace advice given to you by your health care provider. Make sure you discuss any questions you have with your health care provider. Document Revised: 01/28/2017 Document Reviewed: 01/28/2017 Elsevier Patient Education  2020 [employer].  

## 2019-10-12 NOTE — Telephone Encounter (Signed)
Appointments scheduled calendar printed & mailed per 9/16 los

## 2019-10-12 NOTE — Progress Notes (Signed)
Hematology and Oncology Follow Up Visit  Stuart Hill [id number] 09/07/1970 49 y.o. 10/12/2019   Principle Diagnosis:  Secondary polycythemia JAK2 (-), smoker  LEFT Bell's palsy  Current Therapy: Phlebotomy to maintain hematocrit below 45% 2 baby aspirin daily at bedtime - not taking per patient    Interim History:  Stuart Hill is here today for follow-up and phlebotomy. He has been having issues with episodes of SOB and palpitations with mild exertion. He is also feeling fatigued.  Work up with PCP so far has been negative. He has been referred to cardiology and is scheduled to have a stress test on 11/03/2019. He has significant family history of cardiac event and death.  He is taking a baby aspirin daily.  He is currently on a steroid taper.  Hct today is 45.3%. We will do a partial phlebotomy.  No fever, chills, n/v, cough, rash, dizziness, chest pain, abdominal pain or changes in bowel or bladder habits.  No swelling in his extremities. He has positional numbness and tingling in his arms and toes if he sleeps a certain way.  No falls or syncopal episodes to report.  He has a good appetite and is staying well hydrated.  He states that he is still smoking 1 ppd.  He also has 3 drinks of Hennessy in the evenings and smokes weed to relax. I emphasized his needing to cut back and not make this a habit. He has a strong family history of addiction. He verbalized understanding.   ECOG Performance Status: 1 - Symptomatic but completely ambulatory  Medications:  Allergies as of 10/12/2019      Reactions   Plavix [clopidogrel Bisulfate] Other (See Comments)   Confusion, lethargy, slurred speech   Afrin [nasal Spray] Hives   Antihistamines, Chlorpheniramine-type Hives   Benadryl [diphenhydramine Hcl] Hives   Buprenorphine Hcl Hives   Carisoprodol Hives   Codeine Hives   Metoclopramide Hives   Morphine And Related Hives   Oxymetazoline Hives   Pheniramine Hives, Rash       Medication List       Accurate as of October 12, 2019  1:41 PM. If you have any questions, ask your nurse or doctor.        albuterol 108 (90 Base) MCG/ACT inhaler Commonly known as: VENTOLIN HFA Inhale into the lungs.   ALPRAZolam 1 MG tablet Commonly known as: XANAX Take 1 TID as needed for anxiety   aspirin EC 81 MG tablet Take 81 mg by mouth daily. Swallow whole.   meloxicam 15 MG tablet Commonly known as: MOBIC Take 1/2 or 1 daily -Use as needed for joint pain   predniSONE 5 MG (21) Tbpk tablet Commonly known as: STERAPRED UNI-PAK 21 TAB Take by mouth.   sildenafil 20 MG tablet Commonly known as: REVATIO TAKE 1-3 TABLETS BY MOUTH PRIOR TO SEXUAL ACTIVITY AS NEEDED       Allergies:  Allergies  Allergen Reactions  . Plavix [Clopidogrel Bisulfate] Other (See Comments)    Confusion, lethargy, slurred speech  . Afrin [Nasal Spray] Hives  . Antihistamines, Chlorpheniramine-Type Hives  . Benadryl [Diphenhydramine Hcl] Hives  . Buprenorphine Hcl Hives  . Carisoprodol Hives  . Codeine Hives  . Metoclopramide Hives  . Morphine And Related Hives  . Oxymetazoline Hives  . Pheniramine Hives and Rash    Past Medical History, Surgical history, Social history, and Family History were reviewed and updated.  Review of Systems: All other 10 point review of systems is negative.  Physical Exam:  height is 6\' 1"  (1.854 m) and weight is 186 lb (84.4 kg). His oral temperature is 97.7 F (36.5 C). His blood pressure is 136/82 and his pulse is 60. His respiration is 16 and oxygen saturation is 100%.   Wt Readings from Last 3 Encounters:  10/12/19 186 lb (84.4 kg)  10/05/19 182 lb (82.6 kg)  09/11/19 184 lb 1.3 oz (83.5 kg)    Ocular: Sclerae unicteric, pupils equal, round and reactive to light Ear-nose-throat: Oropharynx clear, dentition fair Lymphatic: No cervical or supraclavicular adenopathy Lungs no rales or rhonchi, good excursion bilaterally Heart regular  rate and rhythm, no murmur appreciated Abd soft, nontender, positive bowel sounds MSK no focal spinal tenderness, no joint edema Neuro: non-focal, well-oriented, appropriate affect Breasts: Deferred   Lab Results  Component Value Date   WBC 14.9 (H) 10/12/2019   HGB 14.9 10/12/2019   HCT 45.3 10/12/2019   MCV 88.0 10/12/2019   PLT 302 10/12/2019   Lab Results  Component Value Date   FERRITIN 8 (L) 09/11/2019   IRON 36 (L) 09/11/2019   TIBC 460 (H) 09/11/2019   UIBC 425 (H) 09/11/2019   IRONPCTSAT 8 (L) 09/11/2019   Lab Results  Component Value Date   RETICCTPCT 1.0 12/20/2017   RBC 5.15 10/12/2019   No results found for: KPAFRELGTCHN, LAMBDASER, KAPLAMBRATIO No results found for: IGGSERUM, IGA, IGMSERUM No results found for: Odetta Pink, SPEI   Chemistry      Component Value Date/Time   NA 140 10/12/2019 1300   NA 143 01/01/2017 1453   NA 139 11/12/2015 0945   K 4.4 10/12/2019 1300   K 4.3 01/01/2017 1453   K 3.7 11/12/2015 0945   CL 103 10/12/2019 1300   CL 103 01/01/2017 1453   CO2 30 10/12/2019 1300   CO2 27 01/01/2017 1453   CO2 22 11/12/2015 0945   BUN 12 10/12/2019 1300   BUN 18 01/01/2017 1453   BUN 13.1 11/12/2015 0945   CREATININE 0.92 10/12/2019 1300   CREATININE 0.96 10/05/2019 1042   CREATININE 0.9 11/12/2015 0945      Component Value Date/Time   CALCIUM 9.4 10/12/2019 1300   CALCIUM 8.8 01/01/2017 1453   CALCIUM 8.9 11/12/2015 0945   ALKPHOS 45 10/12/2019 1300   ALKPHOS 44 01/01/2017 1453   ALKPHOS 66 11/12/2015 0945   AST 15 10/12/2019 1300   AST 13 11/12/2015 0945   ALT 14 10/12/2019 1300   ALT 368 (HH) 01/01/2017 1453   ALT 14 11/12/2015 0945   BILITOT 0.5 10/12/2019 1300   BILITOT <0.22 11/12/2015 0945       Impression and Plan: Stuart Hill is a 49 yo caucasian male with history of secondary polycythemia, JAK2 negative, smoker. His iron is chronically low with phlebotomies.   He had a partial phlebotomy today for Hct 45.3%.  Follow-up in 6 weeks.  He can contact our office with any questions or concerns.   [name], NP 9/16/20211:41 PM

## 2019-10-12 NOTE — Progress Notes (Signed)
Stuart Hill presents today for phlebotomy per MD orders. Phlebotomy procedure started at 1400 and ended at 1405. 256 grams removed via 16 gauge needle to Right AC. Pt declined to stay for post phlebotomy observation period. Pt stated he has tolerated procedure multiple times prior without difficulty. Pt aware to call clinic with any questions or concerns. Pt verbalized understanding and had no further questions.  Patient tolerated procedure well. IV needle removed intact.

## 2019-10-13 LAB — IRON AND TIBC
Iron: 46 ug/dL (ref 42–163)
Saturation Ratios: 10 % — ABNORMAL LOW (ref 20–55)
TIBC: 474 ug/dL — ABNORMAL HIGH (ref 202–409)
UIBC: 428 ug/dL — ABNORMAL HIGH (ref 117–376)

## 2019-10-13 LAB — FERRITIN: Ferritin: 9 ng/mL — ABNORMAL LOW (ref 24–336)

## 2019-10-19 ENCOUNTER — Telehealth: Payer: Self-pay | Admitting: Family Medicine

## 2019-10-19 NOTE — Telephone Encounter (Signed)
I received paperwork, written letter to insurance company appealing order for nuclear stress testing.  Will fax today

## 2019-10-19 NOTE — Telephone Encounter (Signed)
-----   Message from Margot Ables sent at 10/13/2019 12:40 PM EDT ----- Regarding: RE: PA for myocardial perfusion Dr. Lorelei Pont,  The request for myocardial perfusion was denied, claim # [id number].   I called [employer] and spoke with Colletta Maryland. The insurance plan does not allow reconsideration but will allow an appeal. Colletta Maryland has faxed the denial letter to [phone] and states it should be received w/in 2 hours of now. The letter explains the appeals process. Call ref # [id number]  Thank you, Drue Dun  ----- Message ----- From: Frederic Jericho Sent: 10/10/2019   9:43 AM EDT To: Margot Ables Subject: RE: PA for echo                                Thank you.  ----- Message ----- From: Margot Ables Sent: 10/09/2019   4:25 PM EDT To: Bonne Dolores Subject: RE: PA for echo                                Veva Holes,  I have added Sherri on the message. She and I have been working on [employer] and [employer]. I have submitted the request for this auth. It is pending.   9/13 KO auth pending for cpt [id number], tracking # [id number], faxed clinicals to [phone]   Thank you, Drue Dun   ----- Message ----- From: Frederic Jericho Sent: 10/09/2019   3:12 PM EDT To: Margot Ables Subject: PA for echo                                    We received an order for a Myoview  from your Provider.  Will you please check to see if a Prior Authorization is needed.  Once PA# is completed  please document on  the order in the comment line and we will be glad to call and schedule.  The appointment will not be able to be made until the PA# is obtained. Thank you so much for your help with this.

## 2019-11-01 ENCOUNTER — Other Ambulatory Visit: Payer: Self-pay

## 2019-11-01 ENCOUNTER — Telehealth (INDEPENDENT_AMBULATORY_CARE_PROVIDER_SITE_OTHER): Payer: PRIVATE HEALTH INSURANCE | Admitting: Family Medicine

## 2019-11-01 DIAGNOSIS — R197 Diarrhea, unspecified: Secondary | ICD-10-CM | POA: Diagnosis not present

## 2019-11-01 NOTE — Progress Notes (Signed)
[place] at [facility] [address] [place], [place] [address] [phone]  Date:  11/01/2019   Name:  Stuart Hill   DOB:  07-05-1970   MRN:  [id number]  PCP:  Darreld Mclean, MD    Chief Complaint: No chief complaint on file.   History of Present Illness:  Stuart Hill is a 49 y.o. very pleasant male patient who presents with the following:  Virtual visit today for concern of diarrhea and stomach upset Patient location is home, provider location is office.  Connected with patient via phone, patient and myself are present on the call today Patient with history of polycythemia vera and secondary polycythemia, anxiety Last seen by myself on September 9 with concern of shortness of breath-noted to have mildly abnormal EKG, D-dimer and troponin which were both negative.  He has been referred to see cardiology and I have attempted to set up a stress test for him.  Insurance initially denied his stress test, we have been appealing this decision but so far have not received approval. He is actually seeing cardiology this Friday in any case.  He is not having any current issues with CP  Pt notes diarrhea that is mostly watery for about 3 weeks now He has tried a bland diet and yogurt with probiotics- has not really helped  He had to use imodium and pepto to leave the house No vomiting  No fever He has not used abx recently His wife has felt fine- she does not have any diarrhea, they tend to eat the same foods He has noted some abd pain and cramping off and on- last week   He notes he has lost some weight during his illness He is not in any distress      Patient Active Problem List   Diagnosis Date Noted  . Tonsillar hypertrophy 04/30/2017  . Obstructive sleep apnea 04/23/2017  . 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 Polycythemia vera ([place]) 05/03/2015  . Hemoptysis 04/03/2015  . Condylomata acuminata 01/18/2015  .  Generalized anxiety disorder 03/05/2014    Past Medical History:  Diagnosis Date  . Allergy   . Anxiety   . Arthritis   . Bell's palsy   . Erythrocytosis 04/10/2015  . Hyperlipidemia   . Polycythemia vera ([place]) 05/03/2015    Past Surgical History:  Procedure Laterality Date  . DENTAL SURGERY    . HAND SURGERY    . TONSILLECTOMY Bilateral 04/30/2017   Procedure: TONSILLECTOMY;  Surgeon: Jerrell Belfast, MD;  Location: [facility];  Service: ENT;  Laterality: Bilateral;    Social History   Tobacco Use  . Smoking status: Current Every Day Smoker    Packs/day: 1.00    Years: 24.00    Pack years: 24.00    Types: Cigarettes  . Smokeless tobacco: Never Used  Vaping Use  . Vaping Use: Never used  Substance Use Topics  . Alcohol use: Yes    Alcohol/week: 1.0 standard drink    Types: 1 Cans of beer per week    Comment: 6-10 beer a week  . Drug use: Yes    Types: Other-see comments, Marijuana    Comment: Marijuana every other day.    Family History  Problem Relation Age of Onset  . COPD Mother   . Fibromyalgia Mother   . Heart disease Father   . Stroke Father   . Depression Brother   . Heart disease Brother   .  Heart disease Maternal Grandmother   . Stroke Maternal Grandmother   . Heart disease Maternal Grandfather   . Stroke Maternal Grandfather   . Heart disease Paternal Grandmother   . Stroke Paternal Grandmother   . Heart disease Paternal Grandfather   . Stroke Paternal Grandfather   . Stroke Paternal Aunt     Allergies  Allergen Reactions  . Plavix [Clopidogrel Bisulfate] Other (See Comments)    Confusion, lethargy, slurred speech  . Afrin [Nasal Spray] Hives  . Antihistamines, Chlorpheniramine-Type Hives  . Benadryl [Diphenhydramine Hcl] Hives  . Buprenorphine Hcl Hives  . Carisoprodol Hives  . Codeine Hives  . Metoclopramide Hives  . Morphine And Related Hives  . Oxymetazoline Hives  . Pheniramine Hives and Rash    Medication list  has been reviewed and updated.  Current Outpatient Medications on File Prior to Visit  Medication Sig Dispense Refill  . albuterol (VENTOLIN HFA) 108 (90 Base) MCG/ACT inhaler Inhale into the lungs.    . ALPRAZolam (XANAX) 1 MG tablet Take 1 TID as needed for anxiety 90 tablet 2  . aspirin EC 81 MG tablet Take 81 mg by mouth daily. Swallow whole.    . meloxicam (MOBIC) 15 MG tablet Take 1/2 or 1 daily -Use as needed for joint pain 30 tablet 3  . predniSONE (STERAPRED UNI-PAK 21 TAB) 5 MG (21) TBPK tablet Take by mouth.    . sildenafil (REVATIO) 20 MG tablet TAKE 1-3 TABLETS BY MOUTH PRIOR TO SEXUAL ACTIVITY AS NEEDED 30 tablet 5   No current facility-administered medications on file prior to visit.    Review of Systems:  As per HPI- otherwise negative.   Physical Examination: There were no vitals filed for this visit. There were no vitals filed for this visit. There is no height or weight on file to calculate BMI. Ideal Body Weight:   Spoke with pt over phone.  Video not available.  He sounds well, no distress Not checking VS at home   Assessment and Plan: Diarrhea, unspecified type - Plan: Stool Culture, Clostridium Difficile by PCR([facility])  Spoke with pt for 11 minutes today  Persistent diarrhea for 3 weeks Need to rule out C diff prior to trying lomotil, also order stool culture Patient plans to pick up stool testing kits asap and return as soon as he can.  Will let me know if getting worse or other problems in the meantime  This visit occurred during the SARS-CoV-2 public health emergency.  Safety protocols were in place, including screening questions prior to the visit, additional usage of staff PPE, and extensive cleaning of exam room while observing appropriate contact time as indicated for disinfecting solutions.    Signed Lamar Blinks, MD

## 2019-11-03 ENCOUNTER — Other Ambulatory Visit: Payer: Self-pay

## 2019-11-03 ENCOUNTER — Encounter: Payer: Self-pay | Admitting: Internal Medicine

## 2019-11-03 ENCOUNTER — Ambulatory Visit (INDEPENDENT_AMBULATORY_CARE_PROVIDER_SITE_OTHER): Payer: PRIVATE HEALTH INSURANCE | Admitting: Internal Medicine

## 2019-11-03 ENCOUNTER — Other Ambulatory Visit (INDEPENDENT_AMBULATORY_CARE_PROVIDER_SITE_OTHER): Payer: PRIVATE HEALTH INSURANCE

## 2019-11-03 VITALS — BP 122/68 | HR 79 | Ht 73.0 in | Wt 179.4 lb

## 2019-11-03 DIAGNOSIS — Z720 Tobacco use: Secondary | ICD-10-CM | POA: Diagnosis not present

## 2019-11-03 DIAGNOSIS — R197 Diarrhea, unspecified: Secondary | ICD-10-CM

## 2019-11-03 DIAGNOSIS — E7849 Other hyperlipidemia: Secondary | ICD-10-CM | POA: Diagnosis not present

## 2019-11-03 DIAGNOSIS — I209 Angina pectoris, unspecified: Secondary | ICD-10-CM | POA: Diagnosis not present

## 2019-11-03 LAB — LIPID PANEL
Chol/HDL Ratio: 3.1 ratio (ref 0.0–5.0)
Cholesterol, Total: 203 mg/dL — ABNORMAL HIGH (ref 100–199)
HDL: 65 mg/dL (ref >39)
LDL Chol Calc (NIH): 121 mg/dL — ABNORMAL HIGH (ref 0–99)
Triglycerides: 93 mg/dL (ref 0–149)
VLDL Cholesterol Cal: 17 mg/dL (ref 5–40)

## 2019-11-03 NOTE — Patient Instructions (Signed)
Medication Instructions:  Your physician recommends that you continue on your current medications as directed. Please refer to the Current Medication list given to you today.  *If you need a refill on your cardiac medications before your next appointment, please call your pharmacy*  Lab Work: You will have labs drawn today: Lipids  Testing/Procedures: Your physician has requested that you have en exercise stress myoview. For further information please visit [email or web address]. Please follow instruction sheet, as given.  Follow-Up: At [facility], you and your health needs are our priority.  As part of our continuing mission to provide you with exceptional heart care, we have created designated Provider Care Teams.  These Care Teams include your primary Cardiologist (physician) and Advanced Practice Providers (APPs -  Physician Assistants and Nurse Practitioners) who all work together to provide you with the care you need, when you need it.  Your next appointment:   3 month(s)  The format for your next appointment:   In Person  Provider:   Rudean Haskell, MD

## 2019-11-03 NOTE — Progress Notes (Addendum)
Cardiology Office Note:    Date:  11/22/2019   ID:  Stuart Hill, DOB 21-Jun-1970, MRN [id number]  PCP:  Darreld Mclean, MD  [place] Cardiologist:  No primary care provider on file.  [facility] Electrophysiologist:  None   Referring MD: Darreld Mclean, MD   CC: Exertional chest pain Consulted for the evaluation of chest pain at the behest of Copland, Gay Filler, MD   History of Present Illness:    Stuart Hill is a 49 y.o. male with a hx of HLD and erectile dysfunction  And tobacco abuse, had heart attacks in both at afe 60 and died, father had bypass in 62s; has hx of poylcythemia vera.  Patient notes that has been seen for chest pain in the past.  Dealing with a lot of anxiety of losing his wife to suicide.  Patient works as a Development worker, international aid.  Able to pull pushes without symptoms.  Patient notes that he went to get groceries and felt short of breath.  With shortness of breath and exertion, he felt his heart beat more, but denies chest pain, stinging, tightness.  Felt chest pressure.  Had distant stress echo in the past that was negative (15 years prior, negative).   No syncope.  Has some exertional shortness of breath.  3 months prior could workout at the gym for 1.5 hours.  Past Medical History:  Diagnosis Date  . Allergy   . Anxiety   . Arthritis   . Bell's palsy   . Erythrocytosis 04/10/2015  . Hyperlipidemia   . Polycythemia vera ([place]) 05/03/2015    Past Surgical History:  Procedure Laterality Date  . DENTAL SURGERY    . HAND SURGERY    . TONSILLECTOMY Bilateral 04/30/2017   Procedure: TONSILLECTOMY;  Surgeon: Jerrell Belfast, MD;  Location: [facility];  Service: ENT;  Laterality: Bilateral;   Current Medications: Current Meds  Medication Sig  . albuterol (VENTOLIN HFA) 108 (90 Base) MCG/ACT inhaler Inhale into the lungs.  . sildenafil (REVATIO) 20 MG tablet TAKE 1-3 TABLETS BY MOUTH PRIOR TO SEXUAL ACTIVITY AS NEEDED  .  [DISCONTINUED] ALPRAZolam (XANAX) 1 MG tablet Take 1 TID as needed for anxiety     Allergies:   Plavix [clopidogrel bisulfate]; Afrin [nasal spray]; Antihistamines, chlorpheniramine-type; Benadryl [diphenhydramine hcl]; Buprenorphine hcl; Carisoprodol; Codeine; Metoclopramide; Morphine and related; Oxymetazoline; and Pheniramine   Social History   Socioeconomic History  . Marital status: Married    Spouse name: Not on file  . Number of children: Not on file  . Years of education: Not on file  . Highest education level: Not on file  Occupational History  . Not on file  Tobacco Use  . Smoking status: Current Every Day Smoker    Packs/day: 1.00    Years: 24.00    Pack years: 24.00    Types: Cigarettes  . Smokeless tobacco: Never Used  Vaping Use  . Vaping Use: Never used  Substance and Sexual Activity  . Alcohol use: Yes    Alcohol/week: 1.0 standard drink    Types: 1 Cans of beer per week    Comment: 6-10 beer a week  . Drug use: Yes    Types: Other-see comments, Marijuana    Comment: Marijuana every other day.  Marland Kitchen Sexual activity: Yes  Other Topics Concern  . Not on file  Social History Narrative  . Not on file   Social Determinants of Health   Financial Resource Strain:   .  Difficulty of Paying Living Expenses: Not on file  Food Insecurity:   . Worried About Charity fundraiser in the Last Year: Not on file  . Ran Out of Food in the Last Year: Not on file  Transportation Needs:   . Lack of Transportation (Medical): Not on file  . Lack of Transportation (Non-Medical): Not on file  Physical Activity:   . Days of Exercise per Week: Not on file  . Minutes of Exercise per Session: Not on file  Stress:   . Feeling of Stress : Not on file  Social Connections:   . Frequency of Communication with Friends and Family: Not on file  . Frequency of Social Gatherings with Friends and Family: Not on file  . Attends Religious Services: Not on file  . Active Member of Clubs or  Organizations: Not on file  . Attends Archivist Meetings: Not on file  . Marital Status: Not on file    Family History: The patient's family history includes COPD in his mother; Depression in his brother; Fibromyalgia in his mother; Heart disease in his brother, father, maternal grandfather, maternal grandmother, paternal grandfather, and paternal grandmother; Stroke in his father, maternal grandfather, maternal grandmother, paternal aunt, paternal grandfather, and paternal grandmother.   ROS:   Please see the history of present illness.    Notes 10 lbs unintentional weight loss  All other systems reviewed and are negative.  EKGs/Labs/Other Studies Reviewed:    The following studies were reviewed today:  EKG:   10/05/19 sinus brady 51 without q wave in V3 (anteroseptal infarct criteria not met) Recent Labs: 10/05/2019: TSH 1.23 10/12/2019: ALT 14; BUN 12; Creatinine 0.92; Hemoglobin 14.9; Platelet Count 302; Potassium 4.4; Sodium 140  Recent Lipid Panel    Component Value Date/Time   CHOL 203 (H) 11/03/2019 1052   TRIG 93 11/03/2019 1052   HDL 65 11/03/2019 1052   CHOLHDL 3.1 11/03/2019 1052   CHOLHDL 4.9 12/13/2014 0842   VLDL 36 (H) 12/13/2014 0842   LDLCALC 121 (H) 11/03/2019 1052   Physical Exam:    VS:  BP 122/68   Pulse 79   Ht '6\' 1"'  (1.854 m)   Wt 179 lb 6.4 oz (81.4 kg)   SpO2 97%   BMI 23.67 kg/m     Wt Readings from Last 3 Encounters:  11/03/19 179 lb 6.4 oz (81.4 kg)  10/12/19 186 lb (84.4 kg)  10/05/19 182 lb (82.6 kg)    GEN: Well nourished, well developed in no acute distress HEENT: Bilateral Frank's Sign NECK: No JVD; No carotid bruits LYMPHATICS: No lymphadenopathy CARDIAC: RRR, no murmurs, rubs, gallops RESPIRATORY:  Clear to auscultation without rales, wheezing or rhonchi  ABDOMEN: Soft, non-tender, non-distended MUSCULOSKELETAL:  No edema; No deformity  SKIN: Warm and dry NEUROLOGIC:  Alert and oriented x 3 PSYCHIATRIC:  Normal affect     ASSESSMENT:    1. Angina pectoris ([place])   2. Tobacco abuse   3. Other hyperlipidemia    PLAN:    In order of problems listed above:  1. Angina chest pain - The patient presents with anginal  chest pain.  - EKG shows sinus bradycardia  - CVD risk factors include HLD and family history.  - Please obtain lipid profile - Patient deferred sublingual nitroglycerin as need for chest pain - Given prior family history, would recommend a nuclear medicine stress test (NPO at midnight); discussed risks, benefits, and alternatives of the diagnostic procedure including chest pain, arrhythmia, and  death.  Patient amenable for testing.  Tobacco Abuse 1. The patient was counseled on the dangers of tobacco use, both inhaled and oral, which include, but are not limited to cardiovascular disease, increased cancer risk of multiple types of cancer, COPD, peripheral vascular disease, strokes. 2. He was also counseled on the benefits of smoking cessation. 3. The patient was firmly advised to quit.    4. We also reviewed strategies to maximize success, including:  Removing cigarettes and smoking materials from environment  Stress management  Substitution of other forms of reinforcement Support of family/friends.  Selecting a quit date.  Patient provided contact information for 1-800-QUIT-NOW   3 month follow up unless new symptoms or abnormal test results warranting change in plan  Would be reasonable for Virtual Follow up Would be reasonable for APP Follow up     Medication Adjustments/Labs and Tests Ordered: Current medicines are reviewed at length with the patient today.  Concerns regarding medicines are outlined above.  Orders Placed This Encounter  Procedures  . Lipid panel  . MYOCARDIAL PERFUSION IMAGING   No orders of the defined types were placed in this encounter.   Patient Instructions  Medication Instructions:  Your physician recommends that you continue on your current  medications as directed. Please refer to the Current Medication list given to you today.  *If you need a refill on your cardiac medications before your next appointment, please call your pharmacy*  Lab Work: You will have labs drawn today: Lipids  Testing/Procedures: Your physician has requested that you have en exercise stress myoview. For further information please visit [email or web address]. Please follow instruction sheet, as given.  Follow-Up: At [facility], you and your health needs are our priority.  As part of our continuing mission to provide you with exceptional heart care, we have created designated Provider Care Teams.  These Care Teams include your primary Cardiologist (physician) and Advanced Practice Providers (APPs -  Physician Assistants and Nurse Practitioners) who all work together to provide you with the care you need, when you need it.  Your next appointment:   3 month(s)  The format for your next appointment:   In Person  Provider:   Rudean Haskell, MD     Signed, Werner Lean, MD  11/22/2019 10:13 AM    [place]   Clarification (discussion with insurance): Patient has exertional angina (chest pressure with exertion).  This improves with rest.  With family history of early CAD.

## 2019-11-06 ENCOUNTER — Other Ambulatory Visit: Payer: Self-pay | Admitting: Family Medicine

## 2019-11-06 ENCOUNTER — Other Ambulatory Visit (HOSPITAL_COMMUNITY)
Admission: RE | Admit: 2019-11-06 | Discharge: 2019-11-06 | Disposition: A | Discharge status: Home / Self Care | Payer: PRIVATE HEALTH INSURANCE | Source: Ambulatory Visit | Attending: Internal Medicine | Admitting: Internal Medicine

## 2019-11-06 ENCOUNTER — Telehealth (HOSPITAL_COMMUNITY): Payer: Self-pay

## 2019-11-06 DIAGNOSIS — Z01812 Encounter for preprocedural laboratory examination: Secondary | ICD-10-CM | POA: Diagnosis present

## 2019-11-06 DIAGNOSIS — F411 Generalized anxiety disorder: Secondary | ICD-10-CM

## 2019-11-06 DIAGNOSIS — Z20822 Contact with and (suspected) exposure to covid-19: Secondary | ICD-10-CM | POA: Diagnosis not present

## 2019-11-06 LAB — CLOSTRIDIUM DIFFICILE TOXIN B, QUALITATIVE, REAL-TIME PCR: Toxigenic C. Difficile by PCR: NOT DETECTED

## 2019-11-06 LAB — SARS CORONAVIRUS 2 (TAT 6-24 HRS): SARS Coronavirus 2: NEGATIVE

## 2019-11-06 NOTE — Telephone Encounter (Signed)
Spoke with the patient, detailed instructions given. Stuart Hill stated Stuart Hill understood and would be here for his test. The patient has had diarrhea for the last 2 weeks. Stuart Hill is being evaluated for this by Janett Billow Copland. Will call him the day before Stuart Hill is scheduled to see if we can do his test. Asked to call back with any questions. S.Krystyne Tewksbury EMTP

## 2019-11-06 NOTE — Telephone Encounter (Signed)
Last written:07/2019 Last ov:11/03/19 Next FB:PZWC Contract:none HEN:IDPO

## 2019-11-07 ENCOUNTER — Other Ambulatory Visit (INDEPENDENT_AMBULATORY_CARE_PROVIDER_SITE_OTHER): Payer: PRIVATE HEALTH INSURANCE

## 2019-11-07 ENCOUNTER — Other Ambulatory Visit: Payer: Self-pay

## 2019-11-07 DIAGNOSIS — R197 Diarrhea, unspecified: Secondary | ICD-10-CM

## 2019-11-08 ENCOUNTER — Telehealth: Payer: Self-pay | Admitting: *Deleted

## 2019-11-08 ENCOUNTER — Telehealth: Payer: Self-pay | Admitting: Internal Medicine

## 2019-11-08 MED ORDER — NITROGLYCERIN 0.4 MG SL SUBL: 0.4000 mg | SUBLINGUAL_TABLET | SUBLINGUAL | 6 refills | Status: DC | PRN

## 2019-11-08 NOTE — Telephone Encounter (Signed)
Pt has been made aware NTG has been sent in. Pt thanked me for the call.

## 2019-11-08 NOTE — Telephone Encounter (Signed)
Pt will need to be counseled to avoid use of nitroglycerin within 24 hours after use of sildenafil to avoid potential large drop in blood pressure.

## 2019-11-08 NOTE — Progress Notes (Signed)
Received his C diff- negative Called pt and let him know- as luck would have it diarrhea seems to have cleared up now. He will let me know if this comes back and I can rx lomotil in that case

## 2019-11-08 NOTE — Telephone Encounter (Signed)
Agree- has anginal CP.  Offered Nitro in the past; OK with nitro.   No evidence of prior infarct, but given his angina, planned for Stress Testing.  Werner Lean, MD

## 2019-11-08 NOTE — Telephone Encounter (Signed)
Called Jessica back from [facility] and let her know about PharmDs recommendations. Janett Billow confirms that this instructions will be written on patients Rx.  Also attempted to call the patient and remind him of the recommendations. Let a message to call back.   Supple, Megan E, RPH-CPP 7 minutes ago (4:24 PM)     Pt will need to be counseled to avoid use of nitroglycerin within 24 hours after use of sildenafil to avoid potential large drop in blood pressure.

## 2019-11-08 NOTE — Telephone Encounter (Signed)
ew Message:    Stuart Hill says there is a drug interaction between Nitroglycerin and Sildenafil.She wanted to know if you still want to do the Nitroglycerin?

## 2019-11-08 NOTE — Telephone Encounter (Signed)
Pt has been notified of lab results by phone with verbal understanding. I d/w the pt recommendations per Dr. Gasper Sells will wait to stress test results before starting cholesterol medication. Patient notified of result.  Please refer to phone note from today for complete details.   Julaine Hua, [facility] 11/08/2019 11:17 AM   Pt also had a question, he thought the doctor told him that he had a heart attack when he saw him. I reviewed notes from MD with the pt as well as NTG was offered to keep on hand in the event he may feel he is having a heart attack and saw that pt declined NTG Rx. After speaking with the pt and the reasons why he should carry NTG, pt is willing and would like to have NTG to have on. Pt has been educated as to how and when to use NTG. I assured the pt that I will send a message to Dr. Gasper Sells about the NTG and will send in Rx once authorized by MD. Pt thanked me for the call and taking the time to explain things to him.

## 2019-11-08 NOTE — Addendum Note (Signed)
Addended by: Michae Kava on: 11/08/2019 01:46 PM   Modules accepted: Orders

## 2019-11-08 NOTE — Telephone Encounter (Signed)
Rx has been sent in for NTG ok per Dr. Gasper Sells.

## 2019-11-08 NOTE — Telephone Encounter (Signed)
-----   Message from Werner Lean, MD sent at 11/03/2019  5:15 PM EDT ----- Results: Elevated Cholesterol but with ASCVD 10 year risk < 7.5%.  Can wait till after the testing to start cholesterol medication  Werner Lean, MD

## 2019-11-09 ENCOUNTER — Encounter (HOSPITAL_COMMUNITY): Payer: PRIVATE HEALTH INSURANCE

## 2019-11-09 LAB — SALMONELLA/SHIGELLA CULT, CAMPY EIA AND SHIGA TOXIN RFL ECOLI
MICRO NUMBER: 11061097
MICRO NUMBER:: 11061098
MICRO NUMBER:: 11061099
Result:: NOT DETECTED
SHIGA RESULT:: NOT DETECTED
SPECIMEN QUALITY: ADEQUATE
SPECIMEN QUALITY:: ADEQUATE
SPECIMEN QUALITY:: ADEQUATE

## 2019-11-13 ENCOUNTER — Other Ambulatory Visit (HOSPITAL_COMMUNITY)
Admission: RE | Admit: 2019-11-13 | Discharge: 2019-11-13 | Disposition: A | Discharge status: Home / Self Care | Payer: PRIVATE HEALTH INSURANCE | Source: Ambulatory Visit | Attending: Internal Medicine | Admitting: Internal Medicine

## 2019-11-13 DIAGNOSIS — Z01818 Encounter for other preprocedural examination: Secondary | ICD-10-CM | POA: Insufficient documentation

## 2019-11-13 DIAGNOSIS — Z20822 Contact with and (suspected) exposure to covid-19: Secondary | ICD-10-CM | POA: Insufficient documentation

## 2019-11-13 LAB — SARS CORONAVIRUS 2 (TAT 6-24 HRS): SARS Coronavirus 2: NEGATIVE

## 2019-11-16 ENCOUNTER — Encounter (HOSPITAL_COMMUNITY): Payer: PRIVATE HEALTH INSURANCE

## 2019-11-20 ENCOUNTER — Telehealth: Payer: Self-pay | Admitting: Family Medicine

## 2019-11-20 ENCOUNTER — Telehealth: Payer: Self-pay | Admitting: Internal Medicine

## 2019-11-20 NOTE — Telephone Encounter (Signed)
Patient states his insurance is not paying for the stress test. Patient had another episode of shob along with chest pressure lasting about 1 1/2 hr. He was told that you might be able to use this information to get insurance approval.

## 2019-11-20 NOTE — Telephone Encounter (Signed)
Patient's wife is following up. She states, per insurance, appeal for stress test was denied because it is missing sound wave images. She is requesting a call back to discuss.

## 2019-11-21 NOTE — Telephone Encounter (Signed)
Called pt back as message was not clear. He had an episode of chest tightness yesterday when he got upset- this is now resolved.  I advised Stuart Hill that I have contacted his cardiologist and let them know that stress was not approved yet so they can work on it

## 2019-11-21 NOTE — Telephone Encounter (Signed)
Patient called back and states, Chest Tightness, trouble breathing.

## 2019-11-21 NOTE — Telephone Encounter (Signed)
Called and [facility] for pt.  Please let us know if he is having any acute or concerning sx.  Otherwise, getting his stress test approved is now the purview of cardiology.  I have left a message for his cardiologist that he is having difficulty getting his stress approved

## 2019-11-22 ENCOUNTER — Other Ambulatory Visit: Payer: Self-pay

## 2019-11-22 DIAGNOSIS — I209 Angina pectoris, unspecified: Secondary | ICD-10-CM

## 2019-11-22 NOTE — Progress Notes (Signed)
Reordered for Myoview Exercise at Dr. Clovis Fredrickson request d/t recent 11/03/19 order being voided because of diagnosis issue.

## 2019-11-23 ENCOUNTER — Inpatient Hospital Stay: Payer: PRIVATE HEALTH INSURANCE | Attending: Family

## 2019-11-23 ENCOUNTER — Inpatient Hospital Stay (HOSPITAL_BASED_OUTPATIENT_CLINIC_OR_DEPARTMENT_OTHER): Payer: PRIVATE HEALTH INSURANCE | Admitting: Family

## 2019-11-23 ENCOUNTER — Encounter: Payer: Self-pay | Admitting: Family

## 2019-11-23 ENCOUNTER — Other Ambulatory Visit: Payer: Self-pay

## 2019-11-23 ENCOUNTER — Inpatient Hospital Stay: Payer: PRIVATE HEALTH INSURANCE

## 2019-11-23 VITALS — BP 129/72 | HR 61 | Temp 98.3°F | Resp 18 | Wt 182.0 lb

## 2019-11-23 DIAGNOSIS — D500 Iron deficiency anemia secondary to blood loss (chronic): Secondary | ICD-10-CM

## 2019-11-23 DIAGNOSIS — F17210 Nicotine dependence, cigarettes, uncomplicated: Secondary | ICD-10-CM | POA: Diagnosis not present

## 2019-11-23 DIAGNOSIS — Z7982 Long term (current) use of aspirin: Secondary | ICD-10-CM | POA: Diagnosis not present

## 2019-11-23 DIAGNOSIS — G510 Bell's palsy: Secondary | ICD-10-CM | POA: Insufficient documentation

## 2019-11-23 DIAGNOSIS — R197 Diarrhea, unspecified: Secondary | ICD-10-CM | POA: Insufficient documentation

## 2019-11-23 DIAGNOSIS — D751 Secondary polycythemia: Secondary | ICD-10-CM

## 2019-11-23 LAB — CBC WITH DIFFERENTIAL (CANCER CENTER ONLY)
Abs Immature Granulocytes: 0.03 10*3/uL (ref 0.00–0.07)
Basophils Absolute: 0.1 10*3/uL (ref 0.0–0.1)
Basophils Relative: 1 %
Eosinophils Absolute: 0.1 10*3/uL (ref 0.0–0.5)
Eosinophils Relative: 1 %
HCT: 45.8 % (ref 39.0–52.0)
Hemoglobin: 15.2 g/dL (ref 13.0–17.0)
Immature Granulocytes: 0 %
Lymphocytes Relative: 23 %
Lymphs Abs: 2.4 10*3/uL (ref 0.7–4.0)
MCH: 29.1 pg (ref 26.0–34.0)
MCHC: 33.2 g/dL (ref 30.0–36.0)
MCV: 87.7 fL (ref 80.0–100.0)
Monocytes Absolute: 0.8 10*3/uL (ref 0.1–1.0)
Monocytes Relative: 8 %
Neutro Abs: 6.9 10*3/uL (ref 1.7–7.7)
Neutrophils Relative %: 67 %
Platelet Count: 259 10*3/uL (ref 150–400)
RBC: 5.22 MIL/uL (ref 4.22–5.81)
RDW: 14.2 % (ref 11.5–15.5)
WBC Count: 10.4 10*3/uL (ref 4.0–10.5)
nRBC: 0 % (ref 0.0–0.2)

## 2019-11-23 LAB — CMP (CANCER CENTER ONLY)
ALT: 14 U/L (ref 0–44)
AST: 16 U/L (ref 15–41)
Albumin: 4.4 g/dL (ref 3.5–5.0)
Alkaline Phosphatase: 43 U/L (ref 38–126)
Anion gap: 7 (ref 5–15)
BUN: 15 mg/dL (ref 6–20)
CO2: 28 mmol/L (ref 22–32)
Calcium: 9.4 mg/dL (ref 8.9–10.3)
Chloride: 102 mmol/L (ref 98–111)
Creatinine: 0.87 mg/dL (ref 0.61–1.24)
GFR, Estimated: >60 mL/min (ref >60)
Glucose, Bld: 90 mg/dL (ref 70–99)
Potassium: 4.1 mmol/L (ref 3.5–5.1)
Sodium: 137 mmol/L (ref 135–145)
Total Bilirubin: 0.3 mg/dL (ref 0.3–1.2)
Total Protein: 7.3 g/dL (ref 6.5–8.1)

## 2019-11-23 NOTE — Progress Notes (Signed)
Hematology and Oncology Follow Up Visit  Stuart Hill [id number] 1970/04/29 49 y.o. 11/23/2019   Principle Diagnosis:  Secondary polycythemia JAK2 (-), smoker  LEFT Bell's palsy  Current Therapy: Phlebotomy to maintain hematocrit below 45% 2 baby aspirin daily at bedtime- not taking per patient   Interim History:  Stuart Hill is here today with his wife for follow-up. He is feeling fatigued.  He is seeing cardiology and currently waiting to schedule an ECHO and nuclear stress test for episodes of angina and SOB with minimal exertion. 2 weeks ago he had uncontrollable diarrhea and state that his stool was dark with pepto but he did have one episode of tarry, coffee ground stool he was worried was old blood.  He states that he was having pain when he ate but this seems to be a little better.  No obvious blood loss noted. No abnormal bruising or petechiae.  No fever, chills, n/v, cough, rash, dizziness, palpitations or changes in bladder habits. He is smoking 1 1/2 ppd and drinking heavily. He states that this is due to stress over health as well as his wife's health.  No swelling, numbness or tingling in his extremities at this time.  No falls or syncopal episodes to report.  His appetite comes and goes. His weight is down 7 lbs since his last visit.   ECOG Performance Status: 1 - Symptomatic but completely ambulatory  Medications:  Allergies as of 11/23/2019      Reactions   Plavix [clopidogrel Bisulfate] Other (See Comments)   Confusion, lethargy, slurred speech   Afrin [nasal Spray] Hives   Antihistamines, Chlorpheniramine-type Hives   Benadryl [diphenhydramine Hcl] Hives   Buprenorphine Hcl Hives   Carisoprodol Hives   Codeine Hives   Metoclopramide Hives   Morphine And Related Hives   Oxymetazoline Hives   Pheniramine Hives, Rash      Medication List       Accurate as of November 23, 2019  1:16 PM. If you have any questions, ask your nurse or doctor.         albuterol 108 (90 Base) MCG/ACT inhaler Commonly known as: VENTOLIN HFA Inhale into the lungs.   ALPRAZolam 1 MG tablet Commonly known as: XANAX TAKE 1 TABLET BY MOUTH THREE TIMES A DAY AS NEEDED FOR ANXIETY   nitroGLYCERIN 0.4 MG SL tablet Commonly known as: NITROSTAT Place 1 tablet (0.4 mg total) under the tongue every 5 (five) minutes as needed for chest pain.   sildenafil 20 MG tablet Commonly known as: REVATIO TAKE 1-3 TABLETS BY MOUTH PRIOR TO SEXUAL ACTIVITY AS NEEDED       Allergies:  Allergies  Allergen Reactions   Plavix [Clopidogrel Bisulfate] Other (See Comments)    Confusion, lethargy, slurred speech   Afrin [Nasal Spray] Hives   Antihistamines, Chlorpheniramine-Type Hives   Benadryl [Diphenhydramine Hcl] Hives   Buprenorphine Hcl Hives   Carisoprodol Hives   Codeine Hives   Metoclopramide Hives   Morphine And Related Hives   Oxymetazoline Hives   Pheniramine Hives and Rash    Past Medical History, Surgical history, Social history, and Family History were reviewed and updated.  Review of Systems: All other 10 point review of systems is negative.   Physical Exam:  vitals were not taken for this visit.   Wt Readings from Last 3 Encounters:  11/03/19 179 lb 6.4 oz (81.4 kg)  10/12/19 186 lb (84.4 kg)  10/05/19 182 lb (82.6 kg)    Ocular: Sclerae unicteric, pupils equal,  round and reactive to light Ear-nose-throat: Oropharynx clear, dentition fair Lymphatic: No cervical or supraclavicular adenopathy Lungs no rales or rhonchi, good excursion bilaterally Heart regular rate and rhythm, no murmur appreciated Abd soft, nontender, positive bowel sounds MSK no focal spinal tenderness, no joint edema Neuro: non-focal, well-oriented, appropriate affect Breasts: Deferred   Lab Results  Component Value Date   WBC 10.4 11/23/2019   HGB 15.2 11/23/2019   HCT 45.8 11/23/2019   MCV 87.7 11/23/2019   PLT 259 11/23/2019   Lab Results   Component Value Date   FERRITIN 9 (L) 10/12/2019   IRON 46 10/12/2019   TIBC 474 (H) 10/12/2019   UIBC 428 (H) 10/12/2019   IRONPCTSAT 10 (L) 10/12/2019   Lab Results  Component Value Date   RETICCTPCT 1.0 12/20/2017   RBC 5.22 11/23/2019   No results found for: KPAFRELGTCHN, LAMBDASER, KAPLAMBRATIO No results found for: IGGSERUM, IGA, IGMSERUM No results found for: Odetta Pink, SPEI   Chemistry      Component Value Date/Time   NA 140 10/12/2019 1300   NA 143 01/01/2017 1453   NA 139 11/12/2015 0945   K 4.4 10/12/2019 1300   K 4.3 01/01/2017 1453   K 3.7 11/12/2015 0945   CL 103 10/12/2019 1300   CL 103 01/01/2017 1453   CO2 30 10/12/2019 1300   CO2 27 01/01/2017 1453   CO2 22 11/12/2015 0945   BUN 12 10/12/2019 1300   BUN 18 01/01/2017 1453   BUN 13.1 11/12/2015 0945   CREATININE 0.92 10/12/2019 1300   CREATININE 0.96 10/05/2019 1042   CREATININE 0.9 11/12/2015 0945      Component Value Date/Time   CALCIUM 9.4 10/12/2019 1300   CALCIUM 8.8 01/01/2017 1453   CALCIUM 8.9 11/12/2015 0945   ALKPHOS 45 10/12/2019 1300   ALKPHOS 44 01/01/2017 1453   ALKPHOS 66 11/12/2015 0945   AST 15 10/12/2019 1300   AST 13 11/12/2015 0945   ALT 14 10/12/2019 1300   ALT 368 (HH) 01/01/2017 1453   ALT 14 11/12/2015 0945   BILITOT 0.5 10/12/2019 1300   BILITOT <0.22 11/12/2015 0945       Impression and Plan: Stuart Hill is a 49yo caucasian male with history of secondary polycythemia, JAK2 negative, smoker. No phlebotomy today due to angina and SOB with exertion. Hct is stable.  Follow-up in 6 weeks.  Today's note forwarded to PCP.  They were encouraged to contact our office with any questions or concerns.   [name], NP 10/28/20211:16 PM

## 2019-11-24 ENCOUNTER — Telehealth: Payer: Self-pay | Admitting: Family

## 2019-11-24 LAB — FERRITIN: Ferritin: 10 ng/mL — ABNORMAL LOW (ref 24–336)

## 2019-11-24 LAB — IRON AND TIBC
Iron: 84 ug/dL (ref 42–163)
Saturation Ratios: 19 % — ABNORMAL LOW (ref 20–55)
TIBC: 446 ug/dL — ABNORMAL HIGH (ref 202–409)
UIBC: 363 ug/dL (ref 117–376)

## 2019-11-24 NOTE — Telephone Encounter (Signed)
Appointments scheduled calendar printed & mailed per 10/28 los

## 2019-11-29 ENCOUNTER — Telehealth (HOSPITAL_COMMUNITY): Payer: Self-pay | Admitting: Internal Medicine

## 2019-11-29 DIAGNOSIS — I209 Angina pectoris, unspecified: Secondary | ICD-10-CM

## 2019-11-29 NOTE — Telephone Encounter (Signed)
Patients order for Myocardial Perfusion will be removed from the [facility] due to:   11/29/19 APPEAL DENIED.Marland KitchenMarland KitchenAA 11/21/19 AUTH PENDING REVIEW PER HENRY T.Marland KitchenMarland KitchenAA 11/15/19 APPEAL STILL UNDER REVIEW PER JACKIE K....AA 11/15/19 cancelled x 2 now due to no PA# from [employer] on the day before appt/LBW

## 2019-11-29 NOTE — Telephone Encounter (Signed)
Left the pt a message to call the office back in regards to bringing him into the office to see Dr. Gasper Sells, to discuss alternative options/testing, to further assess his chest pain, due to recently denied stress test.

## 2019-11-29 NOTE — Telephone Encounter (Signed)
Will send this message to ordering Provider as an FYI.

## 2019-11-29 NOTE — Telephone Encounter (Signed)
Even with clarifying his chest pain they still won't approve the stress test?  If that is the case and he is still having chest pain he should come back and see me so we can discuss options.  Thanks

## 2019-11-30 NOTE — Telephone Encounter (Signed)
Reviewed with precert and there is no additional information that can be provided.  Patient will need to follow up with his doctor for his next steps of care and to review benefits of his plan.  Patient notified to follow up with doctor and insurance. He has upcoming appointment with Dr Gasper Sells.

## 2019-11-30 NOTE — Telephone Encounter (Signed)
Spoke with Estill Bamberg in precert department.  Patient's insurance has denied  myoview and recommended he have a GXT. Will forward to Dr Gasper Sells to see if this would be an option.

## 2019-11-30 NOTE — Telephone Encounter (Signed)
Patient is a 49 yo M with a family history of early coronary artery disease, HLD, Tobacco Use, and Erectile Dysfunction.  He has polycythemia vera.  He is able to do physical work that is strength based, but when he exerts himself, he has angina chest pain and shortness of breath.  We can do an GXT if no other test is covered.  My concern is that if the treatmill test is OK; and he still has DOE, how convinced will we be that he does not have coronary artery disease?  Let's do GXT if nothing else is feasible.  Werner Lean, MD

## 2019-11-30 NOTE — Telephone Encounter (Signed)
I placed call to patient to discuss changing test to GXT.  Left message to call office tomorrow.

## 2019-11-30 NOTE — Telephone Encounter (Signed)
Reviewed with precert department. All clinical information and EKG were sent.  Insurance denied and suggested he follow up with doctor for next step in his care. I gave this information to patient.  He has many questions regarding insurance and denial.  Will see if precert department can contact him to discuss. He would also like to know out of pocket cost for myoview.  Will check with billing.

## 2019-11-30 NOTE — Telephone Encounter (Signed)
Patient returned call and scheduled an appointment for 12/14/19 with Dr. Gasper Sells to discuss alternatives. He also requested a call back to discuss stress test being denied by insurance. He stated, per his wife, the appeal was denied because an echo was not submitted in time (insurance allowed 5 days to submit) and insufficient documentation was provided.  Please call to discuss further.

## 2019-12-01 NOTE — Telephone Encounter (Signed)
Pt returning call

## 2019-12-01 NOTE — Telephone Encounter (Addendum)
I spoke with the pt and he is frustrated with the ins co but he is willing to go ahead and have a plain GXT... he says he has not been working and he needs to have something done.   Pt agrees to the prior covid test and will wait for the [facility] to call him to schedule it asap.

## 2019-12-01 NOTE — Addendum Note (Signed)
Addended by: Stephani Police on: 12/01/2019 12:06 PM   Modules accepted: Orders

## 2019-12-08 ENCOUNTER — Other Ambulatory Visit (HOSPITAL_COMMUNITY)
Admission: RE | Admit: 2019-12-08 | Discharge: 2019-12-08 | Disposition: A | Discharge status: Home / Self Care | Payer: PRIVATE HEALTH INSURANCE | Source: Ambulatory Visit | Attending: Internal Medicine | Admitting: Internal Medicine

## 2019-12-08 DIAGNOSIS — Z20822 Contact with and (suspected) exposure to covid-19: Secondary | ICD-10-CM | POA: Insufficient documentation

## 2019-12-08 DIAGNOSIS — Z01812 Encounter for preprocedural laboratory examination: Secondary | ICD-10-CM | POA: Diagnosis present

## 2019-12-08 LAB — SARS CORONAVIRUS 2 (TAT 6-24 HRS): SARS Coronavirus 2: NEGATIVE

## 2019-12-12 ENCOUNTER — Ambulatory Visit (INDEPENDENT_AMBULATORY_CARE_PROVIDER_SITE_OTHER): Payer: PRIVATE HEALTH INSURANCE

## 2019-12-12 ENCOUNTER — Other Ambulatory Visit: Payer: Self-pay

## 2019-12-12 DIAGNOSIS — I209 Angina pectoris, unspecified: Secondary | ICD-10-CM

## 2019-12-12 LAB — EXERCISE TOLERANCE TEST
Estimated workload: 13.4 METS
Exercise duration (min): 10 min
Exercise duration (sec): 21 s
MPHR: 173 {beats}/min
Peak HR: 139 {beats}/min
Percent HR: 80 %
RPE: 19
Rest HR: 66 {beats}/min

## 2019-12-13 ENCOUNTER — Telehealth: Payer: Self-pay | Admitting: *Deleted

## 2019-12-13 ENCOUNTER — Telehealth: Payer: Self-pay | Admitting: Internal Medicine

## 2019-12-13 NOTE — Telephone Encounter (Signed)
Patient returning Brittany's call for results. Please call/advise.   Thank you!

## 2019-12-13 NOTE — Telephone Encounter (Signed)
I spoke with patient. He would prefer a phone visit.  Appointment tomorrow changed to a phone visit

## 2019-12-13 NOTE — Telephone Encounter (Signed)
Attempted to return patients return call regarding results. Left a message to call back.

## 2019-12-13 NOTE — Telephone Encounter (Signed)
I spoke with patient and reviewed stress test results with him.  Due to his work situation he is asking if OK to change to telemedicine visit tomorrow.  Will check with Dr Gasper Sells.

## 2019-12-13 NOTE — Telephone Encounter (Signed)
  Patient Consent for Virtual Visit         Stuart Hill has provided verbal consent on 12/13/2019 for a virtual visit (video or telephone).   CONSENT FOR VIRTUAL VISIT FOR:  Stuart Hill  By participating in this virtual visit I agree to the following:  I hereby voluntarily request, consent and authorize [facility] and its employed or contracted physicians, physician assistants, nurse practitioners or other licensed health care professionals (the Practitioner), to provide me with telemedicine health care services (the "Services") as deemed necessary by the treating Practitioner. I acknowledge and consent to receive the Services by the Practitioner via telemedicine. I understand that the telemedicine visit will involve communicating with the Practitioner through live audiovisual communication technology and the disclosure of certain medical information by electronic transmission. I acknowledge that I have been given the opportunity to request an in-person assessment or other available alternative prior to the telemedicine visit and am voluntarily participating in the telemedicine visit.  I understand that I have the right to withhold or withdraw my consent to the use of telemedicine in the course of my care at any time, without affecting my right to future care or treatment, and that the Practitioner or I may terminate the telemedicine visit at any time. I understand that I have the right to inspect all information obtained and/or recorded in the course of the telemedicine visit and may receive copies of available information for a reasonable fee.  I understand that some of the potential risks of receiving the Services via telemedicine include:  Marland Kitchen Delay or interruption in medical evaluation due to technological equipment failure or disruption; . Information transmitted may not be sufficient (e.g. poor resolution of images) to allow for appropriate medical decision making by the  Practitioner; and/or  . In rare instances, security protocols could fail, causing a breach of personal health information.  Furthermore, I acknowledge that it is my responsibility to provide information about my medical history, conditions and care that is complete and accurate to the best of my ability. I acknowledge that Practitioner's advice, recommendations, and/or decision may be based on factors not within their control, such as incomplete or inaccurate data provided by me or distortions of diagnostic images or specimens that may result from electronic transmissions. I understand that the practice of medicine is not an exact science and that Practitioner makes no warranties or guarantees regarding treatment outcomes. I acknowledge that a copy of this consent can be made available to me via my patient portal ([name]), or I can request a printed copy by calling the office of [facility].    I understand that my insurance will be billed for this visit.   I have read or had this consent read to me. . I understand the contents of this consent, which adequately explains the benefits and risks of the Services being provided via telemedicine.  . I have been provided ample opportunity to ask questions regarding this consent and the Services and have had my questions answered to my satisfaction. . I give my informed consent for the services to be provided through the use of telemedicine in my medical care

## 2019-12-13 NOTE — Telephone Encounter (Signed)
Follow Up:     Pt is returnng Brittany's call from this morning, concerning his Stress Test results.

## 2019-12-14 ENCOUNTER — Telehealth (INDEPENDENT_AMBULATORY_CARE_PROVIDER_SITE_OTHER): Payer: PRIVATE HEALTH INSURANCE | Admitting: Internal Medicine

## 2019-12-14 ENCOUNTER — Other Ambulatory Visit: Payer: Self-pay

## 2019-12-14 DIAGNOSIS — Z720 Tobacco use: Secondary | ICD-10-CM | POA: Diagnosis not present

## 2019-12-14 DIAGNOSIS — R0609 Other forms of dyspnea: Secondary | ICD-10-CM | POA: Insufficient documentation

## 2019-12-14 DIAGNOSIS — R0600 Dyspnea, unspecified: Secondary | ICD-10-CM | POA: Diagnosis not present

## 2019-12-14 NOTE — Progress Notes (Addendum)
Cardiology Office Note:    Date:  12/14/2019   ID:  Stuart Hill, DOB [birth year], MRN [id number]  PCP:  Darreld Mclean, MD  [facility] Cardiologist:  No primary care provider on file.  [facility] Electrophysiologist:  None   Referring MD: Darreld Mclean, MD   Follow up Shortness of breath Virtual Visit via Telephone Note  I connected with Chanetta Marshall on 12/14/19 at  1:20 PM EST by telephone and verified that I am speaking with the correct person using two identifiers.  Location: Provider: [facility]   I discussed the limitations, risks, security and privacy concerns of performing an evaluation and management service by telephone and the availability of in person appointments. I also discussed with the patient that there may be a patient responsible charge related to this service. The patient expressed understanding and agreed to proceed.  I provided 2+2+11= 15  minutes of non-face-to-face time during this encounter.  Werner Lean, MD   History of Present Illness:    Stuart Hill is a 49 y.o. male with a hx of HLD, ED, Tobacco Abuse, FH CAD, hx of poylcythemia vera who was evaluation for chest pain 11/03/19. Insurance did not feel comfortable with additional testing, so GXT was performed notable for 13.4 METs and no ST/T changes.  Has hypoxia so 95% during exam.  Patient notes.that the wind is still being sucked out of him with light exertion.  He can feel his heart beat in his head.  Also notes that he is real tired after.  Not everytime he exerts himself.  Patient that these episodes are getting worse.  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  These events are independent of his prior events.  No syncope or near syncope.  Patient still has chest tightness and pressure with these events.  Relieved with Xanax and aspirin.  Past Medical History:  Diagnosis Date  . Allergy   . Anxiety   .  Arthritis   . Bell's palsy   . Erythrocytosis 04/10/2015  . Hyperlipidemia   . Polycythemia vera ([place]) 05/03/2015    Past Surgical History:  Procedure Laterality Date  . DENTAL SURGERY    . HAND SURGERY    . TONSILLECTOMY Bilateral 04/30/2017   Procedure: TONSILLECTOMY;  Surgeon: Jerrell Belfast, MD;  Location: [facility];  Service: ENT;  Laterality: Bilateral;   Current Medications: No outpatient medications have been marked as taking for the 12/14/19 encounter (Appointment) with Werner Lean, MD.     Allergies:   Plavix [clopidogrel bisulfate]; Afrin [nasal spray]; Antihistamines, chlorpheniramine-type; Benadryl [diphenhydramine hcl]; Buprenorphine hcl; Carisoprodol; Codeine; Metoclopramide; Morphine and related; Oxymetazoline; and Pheniramine   Social History   Socioeconomic History  . Marital status: Married    Spouse name: Not on file  . Number of children: Not on file  . Years of education: Not on file  . Highest education level: Not on file  Occupational History  . Not on file  Tobacco Use  . Smoking status: Current Every Day Smoker    Packs/day: 1.00    Years: 24.00    Pack years: 24.00    Types: Cigarettes  . Smokeless tobacco: Never Used  Vaping Use  . Vaping Use: Never used  Substance and Sexual Activity  . Alcohol use: Yes    Alcohol/week: 1.0 standard drink    Types: 1 Cans of beer per week    Comment: 6-10  beer a week  . Drug use: Yes    Types: Other-see comments, Marijuana    Comment: Marijuana every other day.  Marland Kitchen Sexual activity: Yes  Other Topics Concern  . Not on file  Social History Narrative  . Not on file   Social Determinants of Health   Financial Resource Strain:   . Difficulty of Paying Living Expenses: Not on file  Food Insecurity:   . Worried About Charity fundraiser in the Last Year: Not on file  . Ran Out of Food in the Last Year: Not on file  Transportation Needs:   . Lack of Transportation (Medical): Not  on file  . Lack of Transportation (Non-Medical): Not on file  Physical Activity:   . Days of Exercise per Week: Not on file  . Minutes of Exercise per Session: Not on file  Stress:   . Feeling of Stress : Not on file  Social Connections:   . Frequency of Communication with Friends and Family: Not on file  . Frequency of Social Gatherings with Friends and Family: Not on file  . Attends Religious Services: Not on file  . Active Member of Clubs or Organizations: Not on file  . Attends Archivist Meetings: Not on file  . Marital Status: Not on file    Family History: The patient's family history includes COPD in his mother; Depression in his brother; Fibromyalgia in his mother; Heart disease in his brother, father, maternal grandfather, maternal grandmother, paternal grandfather, and paternal grandmother; Stroke in his father, maternal grandfather, maternal grandmother, paternal aunt, paternal grandfather, and paternal grandmother.  Brother died of MI 20.    ROS:   Please see the history of present illness.    Notes 10 lbs unintentional weight loss  All other systems reviewed and are negative.  EKGs/Labs/Other Studies Reviewed:    The following studies were reviewed today:  EKG:   10/05/19 sinus brady 51 without q wave in V3 (anteroseptal infarct criteria not met) Recent Labs: 10/05/2019: TSH 1.23 11/23/2019: ALT 14; BUN 15; Creatinine 0.87; Hemoglobin 15.2; Platelet Count 259; Potassium 4.1; Sodium 137  Recent Lipid Panel    Component Value Date/Time   CHOL 203 (H) 11/03/2019 1052   TRIG 93 11/03/2019 1052   HDL 65 11/03/2019 1052   CHOLHDL 3.1 11/03/2019 1052   CHOLHDL 4.9 12/13/2014 0842   VLDL 36 (H) 12/13/2014 0842   LDLCALC 121 (H) 11/03/2019 1052   The 10-year ASCVD risk score Mikey Bussing DC Jr., et al., 2013) is: 4.8%   Values used to calculate the score:     Age: 72 years     Sex: Male     Is Non-Hispanic African American: No     Diabetic: No     Tobacco smoker:  Yes     Systolic Blood Pressure: 170 mmHg     Is BP treated: No     HDL Cholesterol: 65 mg/dL     Total Cholesterol: 203 mg/dL   Physical Exam:    Virtual visit  ASSESSMENT:    No diagnosis found. PLAN:    In order of problems listed above:  SOB/DOE Chest tightness Strong family history of early coronary disease Polycythema Vanita Ingles - will get echocardiogram to look for WMA, or structural causes of SOB - will see in 3 months (virtual visit) for follow up.  Tobacco Abuse- pre-contemplative; stress related 1. The patient was counseled on the dangers of tobacco use, both inhaled and oral, which include, but are  not limited to cardiovascular disease, increased cancer risk of multiple types of cancer, COPD, peripheral vascular disease, strokes. 2. He was also counseled on the benefits of smoking cessation. 3. The patient was firmly advised to quit.    4. We also reviewed strategies to maximize success, including:  Removing cigarettes and smoking materials from environment  Stress management  Substitution of other forms of reinforcement Support of family/friends.  Selecting a quit date.  Patient provided contact information for 1-800-QUIT-NOW   ED and HLD - not taking PNR Nitro which would be contraindicatede with revation PRN   Medication Adjustments/Labs and Tests Ordered: Current medicines are reviewed at length with the patient today.  Concerns regarding medicines are outlined above.  No orders of the defined types were placed in this encounter.  No orders of the defined types were placed in this encounter.   There are no Patient Instructions on file for this visit.   Signed, Werner Lean, MD  12/14/2019 12:40 PM    [facility]   Clarification (discussion with insurance): Patient has exertional angina (chest pressure with exertion).  This improves with rest.  With family history of early CAD.

## 2019-12-14 NOTE — Patient Instructions (Signed)
Medication Instructions:  Your physician recommends that you continue on your current medications as directed. Please refer to the Current Medication list given to you today.  *If you need a refill on your cardiac medications before your next appointment, please call your pharmacy*  Lab Work: None ordered today  Testing/Procedures: Your physician has requested that you have an echocardiogram. Echocardiography is a painless test that uses sound waves to create images of your heart. It provides your doctor with information about the size and shape of your heart and how well your heart's chambers and valves are working. This procedure takes approximately one hour. There are no restrictions for this procedure.  Follow-Up: At [facility], you and your health needs are our priority.  As part of our continuing mission to provide you with exceptional heart care, we have created designated Provider Care Teams.  These Care Teams include your primary Cardiologist (physician) and Advanced Practice Providers (APPs -  Physician Assistants and Nurse Practitioners) who all work together to provide you with the care you need, when you need it.  Your next appointment:   3 month(s)  The format for your next appointment:   Virtual Visit   Provider:   Rudean Haskell, MD

## 2019-12-29 ENCOUNTER — Telehealth: Payer: Self-pay | Admitting: Internal Medicine

## 2019-12-29 NOTE — Telephone Encounter (Signed)
Pt c/o Shortness Of Breath: STAT if SOB developed within the last 24 hours or pt is noticeably SOB on the phone  1. Are you currently SOB (can you hear that pt is SOB on the phone)?  No   2. How long have you been experiencing SOB?  Lasted for about 10 minutes - has been experiencing these episodes (4-5) for a month - month and a half   3. Are you SOB when sitting or when up moving around?  Both - says it started when he was moving around but did not improve for several minutes after sitting   4. Are you currently experiencing any other symptoms?  Fatigue afterwards   Please call/advise   Thank you!   ?

## 2019-12-29 NOTE — Telephone Encounter (Signed)
I spoke with patient. He reports shortness of breath as noted below.  He wanted to make [place] aware. I told him this was noted in last office note.  Patient reports shortness of breath has not changed or worsened since last office visit.  He is aware of upcoming echo appointment

## 2020-01-04 ENCOUNTER — Telehealth: Payer: Self-pay | Admitting: Family

## 2020-01-04 ENCOUNTER — Inpatient Hospital Stay: Payer: PRIVATE HEALTH INSURANCE | Attending: Family

## 2020-01-04 ENCOUNTER — Other Ambulatory Visit: Payer: Self-pay

## 2020-01-04 ENCOUNTER — Encounter: Payer: Self-pay | Admitting: Family

## 2020-01-04 ENCOUNTER — Inpatient Hospital Stay (HOSPITAL_BASED_OUTPATIENT_CLINIC_OR_DEPARTMENT_OTHER): Payer: PRIVATE HEALTH INSURANCE | Admitting: Family

## 2020-01-04 VITALS — BP 117/69 | HR 64 | Temp 98.0°F | Resp 18 | Ht 73.0 in | Wt 184.8 lb

## 2020-01-04 DIAGNOSIS — G510 Bell's palsy: Secondary | ICD-10-CM | POA: Diagnosis not present

## 2020-01-04 DIAGNOSIS — D500 Iron deficiency anemia secondary to blood loss (chronic): Secondary | ICD-10-CM

## 2020-01-04 DIAGNOSIS — D751 Secondary polycythemia: Secondary | ICD-10-CM

## 2020-01-04 DIAGNOSIS — F17210 Nicotine dependence, cigarettes, uncomplicated: Secondary | ICD-10-CM | POA: Diagnosis not present

## 2020-01-04 LAB — CBC WITH DIFFERENTIAL (CANCER CENTER ONLY)
Abs Immature Granulocytes: 0.02 10*3/uL (ref 0.00–0.07)
Basophils Absolute: 0.1 10*3/uL (ref 0.0–0.1)
Basophils Relative: 1 %
Eosinophils Absolute: 0.2 10*3/uL (ref 0.0–0.5)
Eosinophils Relative: 1 %
HCT: 47 % (ref 39.0–52.0)
Hemoglobin: 15.6 g/dL (ref 13.0–17.0)
Immature Granulocytes: 0 %
Lymphocytes Relative: 25 %
Lymphs Abs: 2.6 10*3/uL (ref 0.7–4.0)
MCH: 29.4 pg (ref 26.0–34.0)
MCHC: 33.2 g/dL (ref 30.0–36.0)
MCV: 88.5 fL (ref 80.0–100.0)
Monocytes Absolute: 0.7 10*3/uL (ref 0.1–1.0)
Monocytes Relative: 7 %
Neutro Abs: 7.1 10*3/uL (ref 1.7–7.7)
Neutrophils Relative %: 66 %
Platelet Count: 260 10*3/uL (ref 150–400)
RBC: 5.31 MIL/uL (ref 4.22–5.81)
RDW: 15.4 % (ref 11.5–15.5)
WBC Count: 10.7 10*3/uL — ABNORMAL HIGH (ref 4.0–10.5)
nRBC: 0 % (ref 0.0–0.2)

## 2020-01-04 LAB — CMP (CANCER CENTER ONLY)
ALT: 15 U/L (ref 0–44)
AST: 18 U/L (ref 15–41)
Albumin: 4.4 g/dL (ref 3.5–5.0)
Alkaline Phosphatase: 49 U/L (ref 38–126)
Anion gap: 6 (ref 5–15)
BUN: 11 mg/dL (ref 6–20)
CO2: 31 mmol/L (ref 22–32)
Calcium: 9.6 mg/dL (ref 8.9–10.3)
Chloride: 103 mmol/L (ref 98–111)
Creatinine: 1.04 mg/dL (ref 0.61–1.24)
GFR, Estimated: >60 mL/min (ref >60)
Glucose, Bld: 90 mg/dL (ref 70–99)
Potassium: 4 mmol/L (ref 3.5–5.1)
Sodium: 140 mmol/L (ref 135–145)
Total Bilirubin: 0.5 mg/dL (ref 0.3–1.2)
Total Protein: 7 g/dL (ref 6.5–8.1)

## 2020-01-04 NOTE — Progress Notes (Signed)
Hematology and Oncology Follow Up Visit  Stuart Hill [id number] 09-25-1970 49 y.o. 01/04/2020   Principle Diagnosis:  Secondary polycythemia JAK2 (-), smoker  LEFT Bell's palsy  Current Therapy: Phlebotomy to maintain hematocrit below 45% 2 baby aspirin daily at bedtime- not taking per patient   Interim History:  Stuart Hill is here today for follow-up. He has had some fatigue at times as well as occasional "heaviness" in his chest.  He is scheduled for an ECHO on 01/11/2020.  Hct today is 47.0%. He has chronically low iron due to phlebotomy.  He is still smoking 1 ppd and states that around 2 days a week he will split a bottle of [place] with his wife. We discussed the negative effects that smoking and alcohol take on his body and her verbalized understanding. He will continue to try and cut back.  No fever, chills, n/v, cough, rash, dizziness, SOB, chest pain, palpitations, abdominal pain or changes in bowel or bladder habits.  No swelling, tenderness, numbness or tingling in his extremities.  He has occasional puffiness in his ankles that comes and goes. Pedal pulses are 3+.  No falls or syncope to report.  He has a good appetite and is staying well hydrated. His weight is stable.   ECOG Performance Status: 1 - Symptomatic but completely ambulatory  Medications:  Allergies as of 01/04/2020      Reactions   Plavix [clopidogrel Bisulfate] Other (See Comments)   Confusion, lethargy, slurred speech   Afrin [nasal Spray] Hives   Antihistamines, Chlorpheniramine-type Hives   Benadryl [diphenhydramine Hcl] Hives   Buprenorphine Hcl Hives   Carisoprodol Hives   Codeine Hives   Metoclopramide Hives   Morphine And Related Hives   Oxymetazoline Hives   Pheniramine Hives, Rash      Medication List       Accurate as of January 04, 2020  1:49 PM. If you have any questions, ask your nurse or doctor.        albuterol 108 (90 Base) MCG/ACT inhaler Commonly known  as: VENTOLIN HFA Inhale into the lungs.   ALPRAZolam 1 MG tablet Commonly known as: XANAX TAKE 1 TABLET BY MOUTH THREE TIMES A DAY AS NEEDED FOR ANXIETY   nitroGLYCERIN 0.4 MG SL tablet Commonly known as: NITROSTAT Place 1 tablet (0.4 mg total) under the tongue every 5 (five) minutes as needed for chest pain.   sildenafil 20 MG tablet Commonly known as: REVATIO TAKE 1-3 TABLETS BY MOUTH PRIOR TO SEXUAL ACTIVITY AS NEEDED       Allergies:  Allergies  Allergen Reactions  . Plavix [Clopidogrel Bisulfate] Other (See Comments)    Confusion, lethargy, slurred speech  . Afrin [Nasal Spray] Hives  . Antihistamines, Chlorpheniramine-Type Hives  . Benadryl [Diphenhydramine Hcl] Hives  . Buprenorphine Hcl Hives  . Carisoprodol Hives  . Codeine Hives  . Metoclopramide Hives  . Morphine And Related Hives  . Oxymetazoline Hives  . Pheniramine Hives and Rash    Past Medical History, Surgical history, Social history, and Family History were reviewed and updated.  Review of Systems: All other 10 point review of systems is negative.   Physical Exam:  vitals were not taken for this visit.   Wt Readings from Last 3 Encounters:  11/23/19 182 lb (82.6 kg)  11/03/19 179 lb 6.4 oz (81.4 kg)  10/12/19 186 lb (84.4 kg)    Ocular: Sclerae unicteric, pupils equal, round and reactive to light Ear-nose-throat: Oropharynx clear, dentition fair Lymphatic: No cervical or  supraclavicular adenopathy Lungs no rales or rhonchi, good excursion bilaterally Heart regular rate and rhythm, no murmur appreciated Abd soft, nontender, positive bowel sounds MSK no focal spinal tenderness, no joint edema Neuro: non-focal, well-oriented, appropriate affect Breasts: Deferred   Lab Results  Component Value Date   WBC 10.7 (H) 01/04/2020   HGB 15.6 01/04/2020   HCT 47.0 01/04/2020   MCV 88.5 01/04/2020   PLT 260 01/04/2020   Lab Results  Component Value Date   FERRITIN 10 (L) 11/23/2019   IRON 84  11/23/2019   TIBC 446 (H) 11/23/2019   UIBC 363 11/23/2019   IRONPCTSAT 19 (L) 11/23/2019   Lab Results  Component Value Date   RETICCTPCT 1.0 12/20/2017   RBC 5.31 01/04/2020   No results found for: KPAFRELGTCHN, LAMBDASER, KAPLAMBRATIO No results found for: IGGSERUM, IGA, IGMSERUM No results found for: Kathrynn Ducking, MSPIKE, SPEI   Chemistry      Component Value Date/Time   NA 137 11/23/2019 1301   NA 143 01/01/2017 1453   NA 139 11/12/2015 0945   K 4.1 11/23/2019 1301   K 4.3 01/01/2017 1453   K 3.7 11/12/2015 0945   CL 102 11/23/2019 1301   CL 103 01/01/2017 1453   CO2 28 11/23/2019 1301   CO2 27 01/01/2017 1453   CO2 22 11/12/2015 0945   BUN 15 11/23/2019 1301   BUN 18 01/01/2017 1453   BUN 13.1 11/12/2015 0945   CREATININE 0.87 11/23/2019 1301   CREATININE 0.96 10/05/2019 1042   CREATININE 0.9 11/12/2015 0945      Component Value Date/Time   CALCIUM 9.4 11/23/2019 1301   CALCIUM 8.8 01/01/2017 1453   CALCIUM 8.9 11/12/2015 0945   ALKPHOS 43 11/23/2019 1301   ALKPHOS 44 01/01/2017 1453   ALKPHOS 66 11/12/2015 0945   AST 16 11/23/2019 1301   AST 13 11/12/2015 0945   ALT 14 11/23/2019 1301   ALT 368 (HH) 01/01/2017 1453   ALT 14 11/12/2015 0945   BILITOT 0.3 11/23/2019 1301   BILITOT <0.22 11/12/2015 0945       Impression and Plan: Stuart Hill is a 49yo caucasian male with history of secondary polycythemia, JAK2 negative, smoker. No phlebotomy today. We will hold off to avoid hypoxia associated with frequent phlebotomy and chronic low iron.  Follow-up in 6 weeks. He can contact our office with any questions or concerns. We can certainly see him sooner if needed.   [name], NP 12/9/20211:49 PM

## 2020-01-04 NOTE — Telephone Encounter (Signed)
Appointments scheduled calendar printed & mailed per 12/9 los 

## 2020-01-05 LAB — IRON AND TIBC
Iron: 54 ug/dL (ref 42–163)
Saturation Ratios: 13 % — ABNORMAL LOW (ref 20–55)
TIBC: 431 ug/dL — ABNORMAL HIGH (ref 202–409)
UIBC: 377 ug/dL — ABNORMAL HIGH (ref 117–376)

## 2020-01-05 LAB — FERRITIN: Ferritin: 12 ng/mL — ABNORMAL LOW (ref 24–336)

## 2020-01-10 ENCOUNTER — Other Ambulatory Visit: Payer: Self-pay | Admitting: Family Medicine

## 2020-01-11 ENCOUNTER — Other Ambulatory Visit (HOSPITAL_COMMUNITY): Payer: PRIVATE HEALTH INSURANCE

## 2020-01-31 ENCOUNTER — Telehealth: Payer: Self-pay | Admitting: Family Medicine

## 2020-01-31 DIAGNOSIS — G8929 Other chronic pain: Secondary | ICD-10-CM

## 2020-01-31 DIAGNOSIS — M25512 Pain in left shoulder: Secondary | ICD-10-CM

## 2020-01-31 DIAGNOSIS — F411 Generalized anxiety disorder: Secondary | ICD-10-CM

## 2020-02-01 ENCOUNTER — Telehealth: Payer: Self-pay

## 2020-02-01 NOTE — Telephone Encounter (Signed)
PA initiated via Covermymeds; KEY: [id number]. Awaiting determination.

## 2020-02-05 NOTE — Telephone Encounter (Signed)
PA approved.   Approved. Approved for TRAMADOL HCL Tablet, quantity up to 30 per 30 days, under the pharmacy benefit. The drug has been approved from 02/05/2020 to 05/05/2020. Generic substitution required when available.

## 2020-02-06 ENCOUNTER — Ambulatory Visit: Payer: PRIVATE HEALTH INSURANCE | Admitting: Internal Medicine

## 2020-02-15 ENCOUNTER — Inpatient Hospital Stay: Payer: PRIVATE HEALTH INSURANCE

## 2020-02-15 ENCOUNTER — Telehealth (HOSPITAL_COMMUNITY): Payer: Self-pay | Admitting: Internal Medicine

## 2020-02-15 ENCOUNTER — Inpatient Hospital Stay: Payer: PRIVATE HEALTH INSURANCE | Admitting: Family

## 2020-02-15 DIAGNOSIS — R0609 Other forms of dyspnea: Secondary | ICD-10-CM

## 2020-02-15 DIAGNOSIS — I209 Angina pectoris, unspecified: Secondary | ICD-10-CM

## 2020-02-15 DIAGNOSIS — Z8249 Family history of ischemic heart disease and other diseases of the circulatory system: Secondary | ICD-10-CM

## 2020-02-15 DIAGNOSIS — R0600 Dyspnea, unspecified: Secondary | ICD-10-CM

## 2020-02-15 NOTE — Telephone Encounter (Signed)
Per Dr. Gasper Sells, Doesn't he have Dyspnea on Exertion with angina pectors and early family history of CAD? Why is an echo being denied?  Will reorder echo with dx.

## 2020-02-15 NOTE — Addendum Note (Signed)
Addended by: Aris Georgia, Mykeal Carrick L on: 02/15/2020 04:36 PM   Modules accepted: Orders

## 2020-02-15 NOTE — Telephone Encounter (Signed)
Insurance had denied patients echocardiogram and will not cover. Order will be removed from the WQ.

## 2020-02-22 ENCOUNTER — Ambulatory Visit: Payer: PRIVATE HEALTH INSURANCE | Admitting: Internal Medicine

## 2020-02-23 ENCOUNTER — Inpatient Hospital Stay (HOSPITAL_BASED_OUTPATIENT_CLINIC_OR_DEPARTMENT_OTHER): Payer: PRIVATE HEALTH INSURANCE | Admitting: Family

## 2020-02-23 ENCOUNTER — Inpatient Hospital Stay: Payer: PRIVATE HEALTH INSURANCE | Attending: Family

## 2020-02-23 ENCOUNTER — Encounter: Payer: Self-pay | Admitting: Family

## 2020-02-23 ENCOUNTER — Inpatient Hospital Stay: Payer: PRIVATE HEALTH INSURANCE

## 2020-02-23 ENCOUNTER — Telehealth: Payer: Self-pay

## 2020-02-23 ENCOUNTER — Other Ambulatory Visit: Payer: Self-pay

## 2020-02-23 VITALS — BP 131/82 | HR 66 | Temp 98.4°F | Wt 175.1 lb

## 2020-02-23 VITALS — BP 129/68 | HR 68 | Resp 16

## 2020-02-23 DIAGNOSIS — D500 Iron deficiency anemia secondary to blood loss (chronic): Secondary | ICD-10-CM

## 2020-02-23 DIAGNOSIS — F17210 Nicotine dependence, cigarettes, uncomplicated: Secondary | ICD-10-CM | POA: Insufficient documentation

## 2020-02-23 DIAGNOSIS — D751 Secondary polycythemia: Secondary | ICD-10-CM | POA: Insufficient documentation

## 2020-02-23 LAB — CBC WITH DIFFERENTIAL (CANCER CENTER ONLY)
Abs Immature Granulocytes: 0.01 10*3/uL (ref 0.00–0.07)
Basophils Absolute: 0.1 10*3/uL (ref 0.0–0.1)
Basophils Relative: 1 %
Eosinophils Absolute: 0.1 10*3/uL (ref 0.0–0.5)
Eosinophils Relative: 1 %
HCT: 50.5 % (ref 39.0–52.0)
Hemoglobin: 16.9 g/dL (ref 13.0–17.0)
Immature Granulocytes: 0 %
Lymphocytes Relative: 21 %
Lymphs Abs: 1.5 10*3/uL (ref 0.7–4.0)
MCH: 29.9 pg (ref 26.0–34.0)
MCHC: 33.5 g/dL (ref 30.0–36.0)
MCV: 89.2 fL (ref 80.0–100.0)
Monocytes Absolute: 0.8 10*3/uL (ref 0.1–1.0)
Monocytes Relative: 11 %
Neutro Abs: 4.7 10*3/uL (ref 1.7–7.7)
Neutrophils Relative %: 66 %
Platelet Count: 238 10*3/uL (ref 150–400)
RBC: 5.66 MIL/uL (ref 4.22–5.81)
RDW: 14.4 % (ref 11.5–15.5)
WBC Count: 7.1 10*3/uL (ref 4.0–10.5)
nRBC: 0 % (ref 0.0–0.2)

## 2020-02-23 LAB — CMP (CANCER CENTER ONLY)
ALT: 14 U/L (ref 0–44)
AST: 16 U/L (ref 15–41)
Albumin: 4.6 g/dL (ref 3.5–5.0)
Alkaline Phosphatase: 54 U/L (ref 38–126)
Anion gap: 6 (ref 5–15)
BUN: 12 mg/dL (ref 6–20)
CO2: 31 mmol/L (ref 22–32)
Calcium: 9.6 mg/dL (ref 8.9–10.3)
Chloride: 103 mmol/L (ref 98–111)
Creatinine: 0.86 mg/dL (ref 0.61–1.24)
GFR, Estimated: >60 mL/min (ref >60)
Glucose, Bld: 72 mg/dL (ref 70–99)
Potassium: 4.4 mmol/L (ref 3.5–5.1)
Sodium: 140 mmol/L (ref 135–145)
Total Bilirubin: 0.6 mg/dL (ref 0.3–1.2)
Total Protein: 7.5 g/dL (ref 6.5–8.1)

## 2020-02-23 LAB — IRON AND TIBC
Iron: 108 ug/dL (ref 42–163)
Saturation Ratios: 25 % (ref 20–55)
TIBC: 423 ug/dL — ABNORMAL HIGH (ref 202–409)
UIBC: 315 ug/dL (ref 117–376)

## 2020-02-23 LAB — FERRITIN: Ferritin: 34 ng/mL (ref 24–336)

## 2020-02-23 NOTE — Progress Notes (Signed)
Stuart Hill presents today for phlebotomy per MD orders. Phlebotomy procedure started at 1051 and ended at 1104. 518 grams removed from rt [facility] using 16g phlebotomy kit.  Patient observed for 30 minutes after procedure without any incident. Patient tolerated procedure well. IV needle removed intact.

## 2020-02-23 NOTE — Progress Notes (Signed)
Hematology and Oncology Follow Up Visit  Stuart Hill [id number] 1970-06-18 50 y.o. 02/23/2020   Principle Diagnosis:  Secondary polycythemia JAK2 (-), smoker  LEFT Bell's palsy  Current Therapy: Phlebotomy to maintain hematocrit below 45% 2 baby aspirin daily at bedtime- not taking per patient   Interim History:  Stuart Hill is here today for follow-up. He is doing fairly well. He states that he stays fatigued and sleeps most of the time.  Hct today is 50.5%, Hgb 16.9, MCV 89.  No fever, chills, n/v, rash, dizziness, SOB, chest pain, palpitations, abdominal pain or changes in bowel or bladder habits at this time. He has not noted any blood loss. No abnormal bruising, no petechiae.  He states that he lifted something heavy at work and currently has a non bleeding hemorrhoid. He is using a cream daily as needed.  He has a dry cough which he attributes to smoking. He is down to less than 1 ppd.  No swelling in his extremities.  He has chronic back pain and associated intermittent numbness and tingling in his hands and feet.  No falls or syncope to report.  Appetite is good and he is hydrating. His weight is down 9 lbs since his last visit.  He has stopped seeing a counselor but is considering trying to find one with a little more life experience.   ECOG Performance Status: 1 - Symptomatic but completely ambulatory  Medications:  Allergies as of 02/23/2020      Reactions   Plavix [clopidogrel Bisulfate] Other (See Comments)   Confusion, lethargy, slurred speech   Afrin [nasal Spray] Hives   Antihistamines, Chlorpheniramine-type Hives   Benadryl [diphenhydramine Hcl] Hives   Buprenorphine Hcl Hives   Carisoprodol Hives   Codeine Hives   Metoclopramide Hives   Morphine And Related Hives   Oxymetazoline Hives   Pheniramine Hives, Rash      Medication List       Accurate as of February 23, 2020 10:11 AM. If you have any questions, ask your nurse or doctor.         albuterol 108 (90 Base) MCG/ACT inhaler Commonly known as: VENTOLIN HFA Inhale into the lungs.   ALPRAZolam 1 MG tablet Commonly known as: XANAX TAKE 1 TABLET BY MOUTH THREE TIMES A DAY AS NEEDED FOR ANXIETY   nitroGLYCERIN 0.4 MG SL tablet Commonly known as: NITROSTAT Place 1 tablet (0.4 mg total) under the tongue every 5 (five) minutes as needed for chest pain.   sildenafil 20 MG tablet Commonly known as: REVATIO TAKE 1-3 TABLETS BY MOUTH PRIOR TO SEXUAL ACTIVITY AS NEEDED   traMADol 50 MG tablet Commonly known as: ULTRAM TAKE 1 BY MOUTH DAILY AS NEEDED FOR BACK PAIN       Allergies:  Allergies  Allergen Reactions  . Plavix [Clopidogrel Bisulfate] Other (See Comments)    Confusion, lethargy, slurred speech  . Afrin [Nasal Spray] Hives  . Antihistamines, Chlorpheniramine-Type Hives  . Benadryl [Diphenhydramine Hcl] Hives  . Buprenorphine Hcl Hives  . Carisoprodol Hives  . Codeine Hives  . Metoclopramide Hives  . Morphine And Related Hives  . Oxymetazoline Hives  . Pheniramine Hives and Rash    Past Medical History, Surgical history, Social history, and Family History were reviewed and updated.  Review of Systems: All other 10 point review of systems is negative.   Physical Exam:  weight is 175 lb 1.3 oz (79.4 kg). His temperature is 98.4 F (36.9 C). His blood pressure is 131/82 and  his pulse is 66.   Wt Readings from Last 3 Encounters:  02/23/20 175 lb 1.3 oz (79.4 kg)  01/04/20 184 lb 12.8 oz (83.8 kg)  11/23/19 182 lb (82.6 kg)    Ocular: Sclerae unicteric, pupils equal, round and reactive to light Ear-nose-throat: Oropharynx clear, dentition fair Lymphatic: No cervical or supraclavicular adenopathy Lungs no rales or rhonchi, good excursion bilaterally Heart regular rate and rhythm, no murmur appreciated Abd soft, nontender, positive bowel sounds MSK no focal spinal tenderness, no joint edema Neuro: non-focal, well-oriented, appropriate  affect Breasts: Deferred   Lab Results  Component Value Date   WBC 7.1 02/23/2020   HGB 16.9 02/23/2020   HCT 50.5 02/23/2020   MCV 89.2 02/23/2020   PLT 238 02/23/2020   Lab Results  Component Value Date   FERRITIN 12 (L) 01/04/2020   IRON 54 01/04/2020   TIBC 431 (H) 01/04/2020   UIBC 377 (H) 01/04/2020   IRONPCTSAT 13 (L) 01/04/2020   Lab Results  Component Value Date   RETICCTPCT 1.0 12/20/2017   RBC 5.66 02/23/2020   No results found for: KPAFRELGTCHN, LAMBDASER, KAPLAMBRATIO No results found for: IGGSERUM, IGA, IGMSERUM No results found for: Kathrynn Ducking, MSPIKE, SPEI   Chemistry      Component Value Date/Time   NA 140 01/04/2020 1315   NA 143 01/01/2017 1453   NA 139 11/12/2015 0945   K 4.0 01/04/2020 1315   K 4.3 01/01/2017 1453   K 3.7 11/12/2015 0945   CL 103 01/04/2020 1315   CL 103 01/01/2017 1453   CO2 31 01/04/2020 1315   CO2 27 01/01/2017 1453   CO2 22 11/12/2015 0945   BUN 11 01/04/2020 1315   BUN 18 01/01/2017 1453   BUN 13.1 11/12/2015 0945   CREATININE 1.04 01/04/2020 1315   CREATININE 0.96 10/05/2019 1042   CREATININE 0.9 11/12/2015 0945      Component Value Date/Time   CALCIUM 9.6 01/04/2020 1315   CALCIUM 8.8 01/01/2017 1453   CALCIUM 8.9 11/12/2015 0945   ALKPHOS 49 01/04/2020 1315   ALKPHOS 44 01/01/2017 1453   ALKPHOS 66 11/12/2015 0945   AST 18 01/04/2020 1315   AST 13 11/12/2015 0945   ALT 15 01/04/2020 1315   ALT 368 (HH) 01/01/2017 1453   ALT 14 11/12/2015 0945   BILITOT 0.5 01/04/2020 1315   BILITOT <0.22 11/12/2015 0945       Impression and Plan: [name] is a 50yo caucasian male with history of secondary polycythemia, JAK2 negative, smoker. Patient phlebotomized for Hct 50.5%.  Follow-up in 8 weeks.  He can contact our office with any questions or concerns.   [name], NP 1/28/202210:11 AM

## 2020-02-23 NOTE — Telephone Encounter (Signed)
appts made per 02/23/20 los and pt to rec sch in tx/avs     Stuart Hill 

## 2020-02-23 NOTE — Patient Instructions (Signed)

## 2020-02-28 ENCOUNTER — Telehealth: Payer: Self-pay

## 2020-02-28 NOTE — Telephone Encounter (Signed)
I called him- he has been filling his xanax a few days early for the last several months He states he is not taking more than rx, they just try to fill at a convenient time.  He has some left and is ok to wait until time for next fill JC

## 2020-02-28 NOTE — Telephone Encounter (Signed)
[facility] in regards to patient. She states wife called the pharmacy requesting refill for husbands alprazolam. Last refill on 01/31/2020. rx #90. Sig take one-three tablets daily as needed.   She states her system flagged her for "missuse" of medication. She states he should have about 9 pills left over from rx last time in addition to requesting it this month four days too soon. She would like advice on if it is ok to refill now or advice on over using the alprazolam.    [facility] [place] [address], [place] [facility] [place]  [address] [place] [address]  Phone:  [phone] Fax:  [phone]

## 2020-03-05 ENCOUNTER — Ambulatory Visit: Payer: PRIVATE HEALTH INSURANCE | Admitting: Internal Medicine

## 2020-03-22 ENCOUNTER — Ambulatory Visit: Payer: Self-pay | Admitting: Internal Medicine

## 2020-03-24 NOTE — Progress Notes (Deleted)
[place] at [facility] [address] [place], [place] [address] [phone]  Date:  03/27/2020   Name:  Stuart Hill   DOB:  09-Mar-1970   MRN:  [id number]  PCP:  Darreld Mclean, MD    Chief Complaint: No chief complaint on file.   History of Present Illness:  Stuart Hill is a 50 y.o. very pleasant male patient who presents with the following:  Stuart Hill is here today for a follow-up visit and discuss concern of weight loss History of polycythemia vera and secondary polycythemia, anxiety and family stressors Last seen by myself for a video visit in October, at that time he was struggling with diarrhea  Hepatitis C screening Tetanus vaccine HIV screening Colon cancer screening Some lab work on chart from January, [place], CBC, iron profile Lipid profile on chart from October  He was seen by hematology in January He is phlebotomized to keep hematocrit less than approximate 50%  03/02/2020  01/31/2020   1  Alprazolam 1 Mg Tablet  90.00  30  Je Cop  [id number]  [place] (1204)  1/3  6.00 LME  Comm Ins  [place]    02/05/2020  01/31/2020   1  Tramadol Hcl 50 Mg Tablet  30.00  30  Je Cop  [id number]  Nor (1204)  0/0  5.00 MME  Comm Ins  [place]    01/31/2020  01/31/2020   1  Alprazolam 1 Mg Tablet  90.00  30  Je Cop  [id number]  [place] (1204)  0/3  6.00 LME  Comm Ins  [place]    01/04/2020  11/06/2019   1  Alprazolam 1 Mg Tablet  90.00  30  Je Cop  [id number]  [place] (1204)  2/2  6.00 LME  Comm Ins  [place]    12/07/2019  11/06/2019   1  Alprazolam 1 Mg Tablet  90.00  30  Je Cop  [id number]  [place] (1204)  1/2  6.00 LME  Comm Ins  [place]    11/08/2019  11/06/2019   1  Alprazolam 1 Mg Tablet  90.00  30  Je Cop  [id number]  [place] (1204)  0/2  6.00 LME  Comm Ins  [place]    10/12/2019  06/19/2019   1  Alprazolam 1 Mg Tablet  90.00  30  Je Cop  [id number]  [place] (1204)  2/2  6.00 LME  Comm Ins  [place]    09/12/2019  06/19/2019   1  Alprazolam 1 Mg Tablet  90.00  30  Je Cop  [id number]         Patient  Active Problem List   Diagnosis Date Noted  . DOE (dyspnea on exertion) 12/14/2019  . Angina pectoris ([place]) 11/03/2019  . Tobacco abuse 11/03/2019  . Other hyperlipidemia 11/03/2019  . Tonsillar hypertrophy 04/30/2017  . Obstructive sleep apnea 04/23/2017  . 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 Polycythemia vera ([place]) 05/03/2015  . Hemoptysis 04/03/2015  . Condylomata acuminata 01/18/2015  . Generalized anxiety disorder 03/05/2014    Past Medical History:  Diagnosis Date  . Allergy   . Anxiety   . Arthritis   . Bell's palsy   . Erythrocytosis 04/10/2015  . Hyperlipidemia   . Polycythemia vera ([place]) 05/03/2015    Past Surgical History:  Procedure Laterality Date  . DENTAL SURGERY    . HAND SURGERY    . TONSILLECTOMY Bilateral 04/30/2017   Procedure: TONSILLECTOMY;  Surgeon: Jerrell Belfast,  MD;  Location: [facility];  Service: ENT;  Laterality: Bilateral;    Social History   Tobacco Use  . Smoking status: Current Every Day Smoker    Packs/day: 1.00    Years: 24.00    Pack years: 24.00    Types: Cigarettes  . Smokeless tobacco: Never Used  Vaping Use  . Vaping Use: Never used  Substance Use Topics  . Alcohol use: Yes    Alcohol/week: 1.0 standard drink    Types: 1 Cans of beer per week    Comment: 6-10 beer a week  . Drug use: Yes    Types: Other-see comments, Marijuana    Comment: Marijuana every other day.    Family History  Problem Relation Age of Onset  . COPD Mother   . Fibromyalgia Mother   . Heart disease Father   . Stroke Father   . Depression Brother   . Heart disease Brother   . Heart disease Maternal Grandmother   . Stroke Maternal Grandmother   . Heart disease Maternal Grandfather   . Stroke Maternal Grandfather   . Heart disease Paternal Grandmother   . Stroke Paternal Grandmother   . Heart disease Paternal Grandfather   . Stroke Paternal Grandfather   . Stroke Paternal Aunt     Allergies  Allergen Reactions  . Plavix  [Clopidogrel Bisulfate] Other (See Comments)    Confusion, lethargy, slurred speech  . Afrin [Nasal Spray] Hives  . Antihistamines, Chlorpheniramine-Type Hives  . Benadryl [Diphenhydramine Hcl] Hives  . Buprenorphine Hcl Hives  . Carisoprodol Hives  . Codeine Hives  . Metoclopramide Hives  . Morphine And Related Hives  . Oxymetazoline Hives  . Pheniramine Hives and Rash    Medication list has been reviewed and updated.  Current Outpatient Medications on File Prior to Visit  Medication Sig Dispense Refill  . albuterol (VENTOLIN HFA) 108 (90 Base) MCG/ACT inhaler Inhale into the lungs.    . ALPRAZolam (XANAX) 1 MG tablet TAKE 1 TABLET BY MOUTH THREE TIMES A DAY AS NEEDED FOR ANXIETY 90 tablet 3  . nitroGLYCERIN (NITROSTAT) 0.4 MG SL tablet Place 1 tablet (0.4 mg total) under the tongue every 5 (five) minutes as needed for chest pain. (Patient not taking: Reported on 01/04/2020) 25 tablet 6  . sildenafil (REVATIO) 20 MG tablet TAKE 1-3 TABLETS BY MOUTH PRIOR TO SEXUAL ACTIVITY AS NEEDED 30 tablet 5  . traMADol (ULTRAM) 50 MG tablet TAKE 1 BY MOUTH DAILY AS NEEDED FOR BACK PAIN 30 tablet 0   No current facility-administered medications on file prior to visit.    Review of Systems:  ***  Physical Examination: There were no vitals filed for this visit. There were no vitals filed for this visit. There is no height or weight on file to calculate BMI. Ideal Body Weight:    ***  Assessment and Plan: ***  Signed Lamar Blinks, MD

## 2020-03-26 NOTE — Progress Notes (Deleted)
[place] at [facility] [address] [place], [place] [address] [phone]  Date:  03/28/2020   Name:  Stuart Hill   DOB:  06-24-1970   MRN:  [id number]  PCP:  Darreld Mclean, MD    Chief Complaint: No chief complaint on file.   History of Present Illness:  Stuart Hill is a 50 y.o. very pleasant male patient who presents with the following:  Pt here today with concern of weight loss History of polycythemia managed by oncology    Patient Active Problem List   Diagnosis Date Noted  . DOE (dyspnea on exertion) 12/14/2019  . Angina pectoris ([place]) 11/03/2019  . Tobacco abuse 11/03/2019  . Other hyperlipidemia 11/03/2019  . Tonsillar hypertrophy 04/30/2017  . Obstructive sleep apnea 04/23/2017  . 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 Polycythemia vera ([place]) 05/03/2015  . Hemoptysis 04/03/2015  . Condylomata acuminata 01/18/2015  . Generalized anxiety disorder 03/05/2014    Past Medical History:  Diagnosis Date  . Allergy   . Anxiety   . Arthritis   . Bell's palsy   . Erythrocytosis 04/10/2015  . Hyperlipidemia   . Polycythemia vera ([place]) 05/03/2015    Past Surgical History:  Procedure Laterality Date  . DENTAL SURGERY    . HAND SURGERY    . TONSILLECTOMY Bilateral 04/30/2017   Procedure: TONSILLECTOMY;  Surgeon: Jerrell Belfast, MD;  Location: [facility];  Service: ENT;  Laterality: Bilateral;    Social History   Tobacco Use  . Smoking status: Current Every Day Smoker    Packs/day: 1.00    Years: 24.00    Pack years: 24.00    Types: Cigarettes  . Smokeless tobacco: Never Used  Vaping Use  . Vaping Use: Never used  Substance Use Topics  . Alcohol use: Yes    Alcohol/week: 1.0 standard drink    Types: 1 Cans of beer per week    Comment: 6-10 beer a week  . Drug use: Yes    Types: Other-see comments, Marijuana    Comment: Marijuana every other day.    Family History  Problem  Relation Age of Onset  . COPD Mother   . Fibromyalgia Mother   . Heart disease Father   . Stroke Father   . Depression Brother   . Heart disease Brother   . Heart disease Maternal Grandmother   . Stroke Maternal Grandmother   . Heart disease Maternal Grandfather   . Stroke Maternal Grandfather   . Heart disease Paternal Grandmother   . Stroke Paternal Grandmother   . Heart disease Paternal Grandfather   . Stroke Paternal Grandfather   . Stroke Paternal Aunt     Allergies  Allergen Reactions  . Plavix [Clopidogrel Bisulfate] Other (See Comments)    Confusion, lethargy, slurred speech  . Afrin [Nasal Spray] Hives  . Antihistamines, Chlorpheniramine-Type Hives  . Benadryl [Diphenhydramine Hcl] Hives  . Buprenorphine Hcl Hives  . Carisoprodol Hives  . Codeine Hives  . Metoclopramide Hives  . Morphine And Related Hives  . Oxymetazoline Hives  . Pheniramine Hives and Rash    Medication list has been reviewed and updated.  Current Outpatient Medications on File Prior to Visit  Medication Sig Dispense Refill  . albuterol (VENTOLIN HFA) 108 (90 Base) MCG/ACT inhaler Inhale into the lungs.    . ALPRAZolam (XANAX) 1 MG tablet TAKE 1 TABLET BY MOUTH THREE TIMES A DAY AS NEEDED FOR ANXIETY 90  tablet 3  . nitroGLYCERIN (NITROSTAT) 0.4 MG SL tablet Place 1 tablet (0.4 mg total) under the tongue every 5 (five) minutes as needed for chest pain. (Patient not taking: Reported on 01/04/2020) 25 tablet 6  . sildenafil (REVATIO) 20 MG tablet TAKE 1-3 TABLETS BY MOUTH PRIOR TO SEXUAL ACTIVITY AS NEEDED 30 tablet 5  . traMADol (ULTRAM) 50 MG tablet TAKE 1 BY MOUTH DAILY AS NEEDED FOR BACK PAIN 30 tablet 0   No current facility-administered medications on file prior to visit.    Review of Systems:  As per HPI- otherwise negative.   Physical Examination: There were no vitals filed for this visit. There were no vitals filed for this visit. There is no height or weight on file to calculate  BMI. Ideal Body Weight:    GEN: no acute distress. HEENT: Atraumatic, Normocephalic.  Ears and Nose: No external deformity. CV: RRR, No M/G/R. No JVD. No thrill. No extra heart sounds. PULM: CTA B, no wheezes, crackles, rhonchi. No retractions. No resp. distress. No accessory muscle use. ABD: S, NT, ND, +BS. No rebound. No HSM. EXTR: No c/c/e PSYCH: Normally interactive. Conversant.    Assessment and Plan: *** This visit occurred during the SARS-CoV-2 public health emergency.  Safety protocols were in place, including screening questions prior to the visit, additional usage of staff PPE, and extensive cleaning of exam room while observing appropriate contact time as indicated for disinfecting solutions.    Signed Lamar Blinks, MD

## 2020-03-27 ENCOUNTER — Ambulatory Visit: Payer: Self-pay | Admitting: Family Medicine

## 2020-03-27 DIAGNOSIS — F411 Generalized anxiety disorder: Secondary | ICD-10-CM

## 2020-03-27 DIAGNOSIS — D45 Polycythemia vera: Secondary | ICD-10-CM

## 2020-03-28 ENCOUNTER — Ambulatory Visit: Payer: Self-pay | Admitting: Family Medicine

## 2020-04-01 NOTE — Progress Notes (Signed)
[place] at [facility] [address], [place], [place] [address] [phone]  Date:  04/03/2020   Name:  Stuart Hill   DOB:  10-20-1970   MRN:  [id number]  PCP:  Stuart Mclean, MD    Chief Complaint: [facility] (Hard to catch his breath on 2 episodes. /Ongoing. ), Back Pain (Onging pain but  Worse couple of months.), abdomal pressure (Along with the weight loss ), and Depression (PHQ 9=20)   History of Present Illness:  Stuart Hill is a 50 y.o. very pleasant male patient who presents with the following:  Patient here today for follow-up visit-history of anxiety and depression, sleep apnea, secondary polycythemia Married to [place], he has a Freight forwarder.  [name] accompanies him to his visit today Last seen by myself virtually in October  Most recent visit with oncology for polycythemia was in January  Pt notes that recently he has had a lot more low back pain It is getting in the way of his work- he does physical work as a Development worker, international aid He did a stress test for chest pain last fall and did fine.  However, he was not able to get the nuclear stress test or echocardiogram that were recommended as his insurance would not cover the services.  He felt that he was pain too much for insurance which did not offer good benefits and no longer has insurance coverage   Blood pressure demonstrated a normal response to exercise.  There was no ST segment deviation noted during stress ETT with good exercise tolerance (10:21); no chest pain; normal blood pressure response; no ST changes; negative inadequate exercise tolerance test (patient achieved 80% of predicted target heart rate).  He notes that he will have "events" when he feels quite SOB and tired Tends to occur with more minor exertion or just at rest  He has tried using albuterol when this occurs and it may help sometime  He has lost significant weight He does feel like he  is eating pretty normally He is trying to gain weight by eating more His normal weight is about 15 lbs more than he is currently  He sometimes feels like he has a hard time passing urine for at least 2 weeks-no pain or burning however He notes that his eyesight is getting worse  He has coughed up some specks of blood when he coughs   He notes he is feeling really depressed He is taking trazodone and melatonin and sleeps pretty well -he is actually getting the trazodone from his wife, he is taking 100 mg He notes he is sleeping 12+ hours and still feels tired He does not feel much like getting out and being around people  Even going to the grocery store can seem overwhelming  Stuart Hill has been though a lot of trauma- his mother attempted suicide when he was quite young His brother died of cardiac arrest in his 67s His father died in a house fire- he also had heart issues at a young age  His wife has also attempted suicide Stuart Hill has symptoms of depression.  We discussed safety today.  He does note that he is safe, that he has no intention of hurting himself  Wt Readings from Last 3 Encounters:  04/03/20 174 lb (78.9 kg)  02/23/20 175 lb 1.3 oz (79.4 kg)  01/04/20 184 lb 12.8 oz (83.8 kg)    03/30/2020  01/31/2020   1  Alprazolam  1 Mg Tablet  90.00  30  Je Cop  [id number]  [place] (1204)  2/3  6.00 LME  Comm Ins  [place]    03/02/2020  01/31/2020   1  Alprazolam 1 Mg Tablet  90.00  30  Je Cop  [id number]  Nor (1204)  1/3  6.00 LME  Comm Ins  [place]    02/05/2020  01/31/2020   1  Tramadol Hcl 50 Mg Tablet  30.00  30  Je Cop  [id number]  [place] (1204)  0/0  5.00 MME  Comm Ins  [place]    01/31/2020  01/31/2020   1  Alprazolam 1 Mg Tablet  90.00  30  Je Cop  [id number]  [place] (1204)  0/3  6.00 LME  Comm Ins  [place]    01/04/2020  11/06/2019   1  Alprazolam 1 Mg Tablet  90.00  30  Je Cop  [id number]  [place] (1204)  2/2  6.00 LME  Comm Ins  [place]    12/07/2019  11/06/2019   1  Alprazolam 1 Mg Tablet  90.00  30  Je Cop  [id number]  [place] (1204)   1/2  6.00 LME  Comm Ins  [place]    11/08/2019  11/06/2019   1  Alprazolam 1 Mg Tablet  90.00  30  Je Cop  [id number]  [place] (1204)  0/2  6.00 LME  Comm Ins  [place]    10/12/2019  06/19/2019   1  Alprazolam 1 Mg Tablet  90.00  30  Je Cop  [id number]  [place] (1204)  2/2  6.00 LME  Comm Ins  [place]    09/12/2019  06/19/2019   1  Alprazolam 1 Mg Tablet  90.00  30  Je Cop  [id number]  [place] (1204)  1/2  6.00 LME  Comm Ins  [place]    08/16/2019  04/19/2019   1  Tramadol Hcl 50 Mg Tablet  30.00  30  Je Cop  [id number]         Patient Active Problem List   Diagnosis Date Noted  . DOE (dyspnea on exertion) 12/14/2019  . Angina pectoris ([place]) 11/03/2019  . Tobacco abuse 11/03/2019  . Other hyperlipidemia 11/03/2019  . Tonsillar hypertrophy 04/30/2017  . Obstructive sleep apnea 04/23/2017  . 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 Polycythemia vera ([place]) 05/03/2015  . Hemoptysis 04/03/2015  . Condylomata acuminata 01/18/2015  . Generalized anxiety disorder 03/05/2014    Past Medical History:  Diagnosis Date  . Allergy   . Anxiety   . Arthritis   . Bell's palsy   . Erythrocytosis 04/10/2015  . Hyperlipidemia   . Polycythemia vera ([place]) 05/03/2015    Past Surgical History:  Procedure Laterality Date  . DENTAL SURGERY    . HAND SURGERY    . TONSILLECTOMY Bilateral 04/30/2017   Procedure: TONSILLECTOMY;  Surgeon: Jerrell Belfast, MD;  Location: [place];  Service: ENT;  Laterality: Bilateral;    Social History   Tobacco Use  . Smoking status: Current Every Day Smoker    Packs/day: 1.00    Years: 24.00    Pack years: 24.00    Types: Cigarettes  . Smokeless tobacco: Never Used  Vaping Use  . Vaping Use: Never used  Substance Use Topics  . Alcohol use: Yes    Alcohol/week: 1.0 standard drink    Types: 1 Cans of beer per week    Comment: 6-10 beer a week  . Drug use: Yes    Types:  Other-see comments, Marijuana    Comment: Marijuana every other day.    Family History  Problem Relation Age of  Onset  . COPD Mother   . Fibromyalgia Mother   . Heart disease Father   . Stroke Father   . Depression Brother   . Heart disease Brother   . Heart disease Maternal Grandmother   . Stroke Maternal Grandmother   . Heart disease Maternal Grandfather   . Stroke Maternal Grandfather   . Heart disease Paternal Grandmother   . Stroke Paternal Grandmother   . Heart disease Paternal Grandfather   . Stroke Paternal Grandfather   . Stroke Paternal Aunt     Allergies  Allergen Reactions  . Plavix [Clopidogrel Bisulfate] Other (See Comments)    Confusion, lethargy, slurred speech  . Afrin [Nasal Spray] Hives  . Antihistamines, Chlorpheniramine-Type Hives  . Benadryl [Diphenhydramine Hcl] Hives  . Buprenorphine Hcl Hives  . Carisoprodol Hives  . Codeine Hives  . Metoclopramide Hives  . Morphine And Related Hives  . Oxymetazoline Hives  . Pheniramine Hives and Rash    Medication list has been reviewed and updated.  Current Outpatient Medications on File Prior to Visit  Medication Sig Dispense Refill  . ALPRAZolam (XANAX) 1 MG tablet TAKE 1 TABLET BY MOUTH THREE TIMES A DAY AS NEEDED FOR ANXIETY 90 tablet 3  . nitroGLYCERIN (NITROSTAT) 0.4 MG SL tablet Place 1 tablet (0.4 mg total) under the tongue every 5 (five) minutes as needed for chest pain. 25 tablet 6  . sildenafil (REVATIO) 20 MG tablet TAKE 1-3 TABLETS BY MOUTH PRIOR TO SEXUAL ACTIVITY AS NEEDED 30 tablet 5  . traMADol (ULTRAM) 50 MG tablet TAKE 1 BY MOUTH DAILY AS NEEDED FOR BACK PAIN 30 tablet 0   No current facility-administered medications on file prior to visit.    Review of Systems:  As per HPI- otherwise negative.   Physical Examination: Vitals:   04/03/20 1506  BP: 122/80  Pulse: 64  Temp: 97.8 F (36.6 C)  SpO2: 99%   Vitals:   04/03/20 1506  Weight: 174 lb (78.9 kg)  Height: 6\' 3"  (1.905 m)   Body mass index is 21.75 kg/m. Ideal Body Weight: Weight in (lb) to have BMI = 25: 199.6  GEN: no acute  distress.  Normal weight but has lost weight from his typical appearance HEENT: Atraumatic, Normocephalic.  Ears and Nose: No external deformity. CV: RRR, No M/G/R. No JVD. No thrill. No extra heart sounds. PULM: CTA B, no wheezes, crackles, rhonchi. No retractions. No resp. distress. No accessory muscle use. ABD: S, NT, ND, +BS. No rebound. No HSM.  Minor discomfort over the bladder with palpation EXTR: No c/c/e PSYCH: Normally interactive. Conversant.   Results for orders placed or performed in visit on 04/03/20  POCT urinalysis dipstick  Result Value Ref Range   Color, UA yellow yellow   Clarity, UA clear clear   Glucose, UA negative negative mg/dL   Bilirubin, UA negative negative   Ketones, POC UA negative negative mg/dL   Spec Grav, UA 1.015 1.010 - 1.025   Blood, UA negative negative   pH, UA 5.0 5.0 - 8.0   Protein Ur, POC negative negative mg/dL   Urobilinogen, UA 0.2 0.2 or 1.0 E.U./dL   Nitrite, UA Negative Negative   Leukocytes, UA Negative Negative     Assessment and Plan: Generalized anxiety disorder - Plan: venlafaxine XR (EFFEXOR XR) 75 MG 24 hr capsule  Urinary frequency - Plan: POCT  urinalysis dipstick  Decreased urine stream - Plan: tamsulosin (FLOMAX) 0.4 MG CAPS capsule  Hemoptysis - Plan: CT Chest W Contrast  Le is here today with a few concerns.  Unfortunately, he does have a significant history of psychological trauma as well as some health problems and financial constraints. He notes some urinary symptoms, no evidence of infection on urine dip.  Did not do culture due to cost.  We will have him try Flomax, he will let us know if helpful He notes significant symptoms of depression.  He is sleeping too much, feels tired and has poor appetite.  I asked him to DC trazodone, will have him try Effexor instead.  I have asked him to keep me closely updated as to how he is feeling, and to seek care immediately if he is not safe  Finally, he has a significant  history of smoking as well as working in Merchandiser, retail.  He has lost weight and is having hemoptysis.  We decided to order a CT chest with contrast, he will check on self-pay cost This visit occurred during the SARS-CoV-2 public health emergency.  Safety protocols were in place, including screening questions prior to the visit, additional usage of staff PPE, and extensive cleaning of exam room while observing appropriate contact time as indicated for disinfecting solutions.    Signed Lamar Blinks, MD

## 2020-04-03 ENCOUNTER — Encounter: Payer: Self-pay | Admitting: Family Medicine

## 2020-04-03 ENCOUNTER — Ambulatory Visit (INDEPENDENT_AMBULATORY_CARE_PROVIDER_SITE_OTHER): Payer: Self-pay | Admitting: Family Medicine

## 2020-04-03 ENCOUNTER — Other Ambulatory Visit: Payer: Self-pay

## 2020-04-03 VITALS — BP 122/80 | HR 64 | Temp 97.8°F | Ht 75.0 in | Wt 174.0 lb

## 2020-04-03 DIAGNOSIS — F411 Generalized anxiety disorder: Secondary | ICD-10-CM

## 2020-04-03 DIAGNOSIS — R350 Frequency of micturition: Secondary | ICD-10-CM

## 2020-04-03 DIAGNOSIS — R042 Hemoptysis: Secondary | ICD-10-CM

## 2020-04-03 DIAGNOSIS — R39198 Other difficulties with micturition: Secondary | ICD-10-CM

## 2020-04-03 LAB — POCT URINALYSIS DIP (MANUAL ENTRY)
Bilirubin, UA: NEGATIVE
Blood, UA: NEGATIVE
Glucose, UA: NEGATIVE mg/dL
Ketones, POC UA: NEGATIVE mg/dL
Leukocytes, UA: NEGATIVE
Nitrite, UA: NEGATIVE
Protein Ur, POC: NEGATIVE mg/dL
Spec Grav, UA: 1.015 (ref 1.010–1.025)
Urobilinogen, UA: 0.2 E.U./dL
pH, UA: 5 (ref 5.0–8.0)

## 2020-04-03 MED ORDER — VENLAFAXINE HCL ER 75 MG PO CP24: 75.0000 mg | ORAL_CAPSULE | Freq: Every day | ORAL | 4 refills | Status: DC

## 2020-04-03 MED ORDER — ALBUTEROL SULFATE HFA 108 (90 BASE) MCG/ACT IN AERS: 1.0000 | INHALATION_SPRAY | Freq: Four times a day (QID) | RESPIRATORY_TRACT | 3 refills | Status: DC | PRN

## 2020-04-03 MED ORDER — TAMSULOSIN HCL 0.4 MG PO CAPS: 0.4000 mg | ORAL_CAPSULE | Freq: Every day | ORAL | 3 refills | Status: DC

## 2020-04-03 NOTE — Patient Instructions (Addendum)
Good to see you today- I am sorry you are struggling so much Please stop using trazodone at least for now, and start on venlafaxine (effexor) for depression Please let me know how this works for you, or if it is too expensive for you to fill Check out goodrx for cost savings   We will set up a CT scan of your chest- please call [facility] and inquire about cost to see if this is doable for you  Address: [address], [place], [place] [address] Phone: [phone]  We also will try flomax to help with urination.  Try this and see if helpful for you

## 2020-04-05 ENCOUNTER — Ambulatory Visit
Admission: RE | Admit: 2020-04-05 | Discharge: 2020-04-05 | Disposition: A | Discharge status: Home / Self Care | Payer: No Typology Code available for payment source | Source: Ambulatory Visit | Attending: Family Medicine | Admitting: Family Medicine

## 2020-04-05 DIAGNOSIS — R042 Hemoptysis: Secondary | ICD-10-CM

## 2020-04-05 MED ORDER — IOPAMIDOL (ISOVUE-300) INJECTION 61%
75.0000 mL | Freq: Once | INTRAVENOUS | Status: AC | PRN
  Administered 2020-04-05: 75 mL via INTRAVENOUS

## 2020-04-08 ENCOUNTER — Encounter: Payer: Self-pay | Admitting: Family Medicine

## 2020-04-08 NOTE — Telephone Encounter (Signed)
Open in error

## 2020-04-08 NOTE — Telephone Encounter (Signed)
Could you look at CT, it says "acute abnormality" on upper abdomen but the result was normal. I wasn't sure if the acute abnormality was from the emphysema?

## 2020-04-17 ENCOUNTER — Inpatient Hospital Stay: Payer: Self-pay

## 2020-04-17 ENCOUNTER — Inpatient Hospital Stay: Payer: Self-pay | Attending: Family

## 2020-04-17 ENCOUNTER — Other Ambulatory Visit: Payer: Self-pay

## 2020-04-17 ENCOUNTER — Inpatient Hospital Stay (HOSPITAL_BASED_OUTPATIENT_CLINIC_OR_DEPARTMENT_OTHER): Payer: Self-pay | Admitting: Family

## 2020-04-17 VITALS — BP 120/68 | HR 60 | Resp 16

## 2020-04-17 DIAGNOSIS — D751 Secondary polycythemia: Secondary | ICD-10-CM

## 2020-04-17 DIAGNOSIS — Z7982 Long term (current) use of aspirin: Secondary | ICD-10-CM | POA: Insufficient documentation

## 2020-04-17 DIAGNOSIS — F17210 Nicotine dependence, cigarettes, uncomplicated: Secondary | ICD-10-CM | POA: Insufficient documentation

## 2020-04-17 DIAGNOSIS — D500 Iron deficiency anemia secondary to blood loss (chronic): Secondary | ICD-10-CM

## 2020-04-17 DIAGNOSIS — R5383 Other fatigue: Secondary | ICD-10-CM | POA: Insufficient documentation

## 2020-04-17 LAB — CBC WITH DIFFERENTIAL (CANCER CENTER ONLY)
Abs Immature Granulocytes: 0.03 10*3/uL (ref 0.00–0.07)
Basophils Absolute: 0.1 10*3/uL (ref 0.0–0.1)
Basophils Relative: 1 %
Eosinophils Absolute: 0.1 10*3/uL (ref 0.0–0.5)
Eosinophils Relative: 1 %
HCT: 48.3 % (ref 39.0–52.0)
Hemoglobin: 16.4 g/dL (ref 13.0–17.0)
Immature Granulocytes: 0 %
Lymphocytes Relative: 20 %
Lymphs Abs: 1.7 10*3/uL (ref 0.7–4.0)
MCH: 30.7 pg (ref 26.0–34.0)
MCHC: 34 g/dL (ref 30.0–36.0)
MCV: 90.4 fL (ref 80.0–100.0)
Monocytes Absolute: 0.7 10*3/uL (ref 0.1–1.0)
Monocytes Relative: 9 %
Neutro Abs: 5.9 10*3/uL (ref 1.7–7.7)
Neutrophils Relative %: 69 %
Platelet Count: 213 10*3/uL (ref 150–400)
RBC: 5.34 MIL/uL (ref 4.22–5.81)
RDW: 13.8 % (ref 11.5–15.5)
WBC Count: 8.6 10*3/uL (ref 4.0–10.5)
nRBC: 0 % (ref 0.0–0.2)

## 2020-04-17 LAB — CMP (CANCER CENTER ONLY)
ALT: 14 U/L (ref 0–44)
AST: 15 U/L (ref 15–41)
Albumin: 4.2 g/dL (ref 3.5–5.0)
Alkaline Phosphatase: 51 U/L (ref 38–126)
Anion gap: 4 — ABNORMAL LOW (ref 5–15)
BUN: 14 mg/dL (ref 6–20)
CO2: 34 mmol/L — ABNORMAL HIGH (ref 22–32)
Calcium: 9.6 mg/dL (ref 8.9–10.3)
Chloride: 103 mmol/L (ref 98–111)
Creatinine: 0.91 mg/dL (ref 0.61–1.24)
GFR, Estimated: >60 mL/min (ref >60)
Glucose, Bld: 76 mg/dL (ref 70–99)
Potassium: 4.9 mmol/L (ref 3.5–5.1)
Sodium: 141 mmol/L (ref 135–145)
Total Bilirubin: 0.6 mg/dL (ref 0.3–1.2)
Total Protein: 7.2 g/dL (ref 6.5–8.1)

## 2020-04-17 LAB — IRON AND TIBC
Iron: 101 ug/dL (ref 45–182)
Saturation Ratios: 22 % (ref 17.9–39.5)
TIBC: 468 ug/dL — ABNORMAL HIGH (ref 250–450)
UIBC: 367 ug/dL

## 2020-04-17 LAB — FERRITIN: Ferritin: 9 ng/mL — ABNORMAL LOW (ref 24–336)

## 2020-04-17 NOTE — Progress Notes (Signed)
Hematology and Oncology Follow Up Visit  Stuart Hill [id number] 10/11/1970 50 y.o. 04/17/2020   Principle Diagnosis:  Secondary polycythemia JAK2 (-), smoker  LEFT Bell's palsy  Current Therapy: Phlebotomy to maintain hematocrit below 45% 2 baby aspirin daily at bedtime- not taking per patient           Interim History:  Stuart Hill is here today with his wife for follow-up. He states that he was recently diagnosed with emphysema after c/o chest discomfort and SOB with any exertion.  He is still having these symptoms when he smokes. He has cut back to 10 cigarettes a day with the intent of eventually quitting all together.  He is still fatigued and rests when needed. He is hoping to start exercising again to build up his stamina.  No fever, chills, n/v, cough, rash, dizziness, palpitations, abdominal pain or changes in bowel or bladder habits.  No blood loss noted. No bruising or petechiae.  No swelling, tenderness, numbness or tingling in his extremities at this time. He will sometimes wake up with numbness and tingling in his feet which resolves once he gets up and moving.  No falls or syncope to report.  He has maintained a good appetite and is staying well hydrated. His weight is stable at 178 lbs.   ECOG Performance Status: 1 - Symptomatic but completely ambulatory  Medications:  Allergies as of 04/17/2020      Reactions   Plavix [clopidogrel Bisulfate] Other (See Comments)   Confusion, lethargy, slurred speech   Afrin [nasal Spray] Hives   Antihistamines, Chlorpheniramine-type Hives   Benadryl [diphenhydramine Hcl] Hives   Buprenorphine Hcl Hives   Carisoprodol Hives   Codeine Hives   Metoclopramide Hives   Morphine And Related Hives   Oxymetazoline Hives   Pheniramine Hives, Rash      Medication List       Accurate as of April 17, 2020  1:29 PM. If you have any questions, ask your nurse or doctor.        albuterol 108 (90 Base) MCG/ACT  inhaler Commonly known as: VENTOLIN HFA Inhale 1-2 puffs into the lungs every 6 (six) hours as needed for wheezing or shortness of breath.   ALPRAZolam 1 MG tablet Commonly known as: XANAX TAKE 1 TABLET BY MOUTH THREE TIMES A DAY AS NEEDED FOR ANXIETY   nitroGLYCERIN 0.4 MG SL tablet Commonly known as: NITROSTAT Place 1 tablet (0.4 mg total) under the tongue every 5 (five) minutes as needed for chest pain.   sildenafil 20 MG tablet Commonly known as: REVATIO TAKE 1-3 TABLETS BY MOUTH PRIOR TO SEXUAL ACTIVITY AS NEEDED   tamsulosin 0.4 MG Caps capsule Commonly known as: FLOMAX Take 1 capsule (0.4 mg total) by mouth daily.   traMADol 50 MG tablet Commonly known as: ULTRAM TAKE 1 BY MOUTH DAILY AS NEEDED FOR BACK PAIN   venlafaxine XR 75 MG 24 hr capsule Commonly known as: Effexor XR Take 1 capsule (75 mg total) by mouth daily with breakfast.       Allergies:  Allergies  Allergen Reactions  . Plavix [Clopidogrel Bisulfate] Other (See Comments)    Confusion, lethargy, slurred speech  . Afrin [Nasal Spray] Hives  . Antihistamines, Chlorpheniramine-Type Hives  . Benadryl [Diphenhydramine Hcl] Hives  . Buprenorphine Hcl Hives  . Carisoprodol Hives  . Codeine Hives  . Metoclopramide Hives  . Morphine And Related Hives  . Oxymetazoline Hives  . Pheniramine Hives and Rash    Past Medical History,  Surgical history, Social history, and Family History were reviewed and updated.  Review of Systems: All other 10 point review of systems is negative.   Physical Exam:  vitals were not taken for this visit.   Wt Readings from Last 3 Encounters:  04/03/20 174 lb (78.9 kg)  02/23/20 175 lb 1.3 oz (79.4 kg)  01/04/20 184 lb 12.8 oz (83.8 kg)    Ocular: Sclerae unicteric, pupils equal, round and reactive to light Ear-nose-throat: Oropharynx clear, dentition fair Lymphatic: No cervical or supraclavicular adenopathy Lungs no rales or rhonchi, good excursion bilaterally Heart  regular rate and rhythm, no murmur appreciated Abd soft, nontender, positive bowel sounds MSK no focal spinal tenderness, no joint edema Neuro: non-focal, well-oriented, appropriate affect Breasts: Deferred   Lab Results  Component Value Date   WBC 8.6 04/17/2020   HGB 16.4 04/17/2020   HCT 48.3 04/17/2020   MCV 90.4 04/17/2020   PLT 213 04/17/2020   Lab Results  Component Value Date   FERRITIN 34 02/23/2020   IRON 108 02/23/2020   TIBC 423 (H) 02/23/2020   UIBC 315 02/23/2020   IRONPCTSAT 25 02/23/2020   Lab Results  Component Value Date   RETICCTPCT 1.0 12/20/2017   RBC 5.34 04/17/2020   No results found for: KPAFRELGTCHN, LAMBDASER, KAPLAMBRATIO No results found for: IGGSERUM, IGA, IGMSERUM No results found for: Kathrynn Ducking, MSPIKE, SPEI   Chemistry      Component Value Date/Time   NA 140 02/23/2020 0942   NA 143 01/01/2017 1453   NA 139 11/12/2015 0945   K 4.4 02/23/2020 0942   K 4.3 01/01/2017 1453   K 3.7 11/12/2015 0945   CL 103 02/23/2020 0942   CL 103 01/01/2017 1453   CO2 31 02/23/2020 0942   CO2 27 01/01/2017 1453   CO2 22 11/12/2015 0945   BUN 12 02/23/2020 0942   BUN 18 01/01/2017 1453   BUN 13.1 11/12/2015 0945   CREATININE 0.86 02/23/2020 0942   CREATININE 0.96 10/05/2019 1042   CREATININE 0.9 11/12/2015 0945      Component Value Date/Time   CALCIUM 9.6 02/23/2020 0942   CALCIUM 8.8 01/01/2017 1453   CALCIUM 8.9 11/12/2015 0945   ALKPHOS 54 02/23/2020 0942   ALKPHOS 44 01/01/2017 1453   ALKPHOS 66 11/12/2015 0945   AST 16 02/23/2020 0942   AST 13 11/12/2015 0945   ALT 14 02/23/2020 0942   ALT 368 (HH) 01/01/2017 1453   ALT 14 11/12/2015 0945   BILITOT 0.6 02/23/2020 0942   BILITOT <0.22 11/12/2015 0945       Impression and Plan: Stuart Hill is a 50yo caucasian male with history of secondary polycythemia, JAK2 negative, smoker. Patient phlebotomized today for Hct 48.3%.  Follow-up  in 2 months.  He can contact our office with any questions or concerns.   [name], NP 3/23/20221:29 PM

## 2020-04-17 NOTE — Patient Instructions (Signed)

## 2020-04-19 ENCOUNTER — Inpatient Hospital Stay: Payer: Self-pay

## 2020-04-19 ENCOUNTER — Inpatient Hospital Stay: Payer: Self-pay | Admitting: Family

## 2020-05-05 NOTE — Progress Notes (Deleted)
[place] at [facility] [address], [place], [place] [address] [phone]  Date:  05/08/2020   Name:  Stuart Hill   DOB:  [birth year]   MRN:  [id number]  PCP:  Darreld Mclean, MD    Chief Complaint: No chief complaint on file.   History of Present Illness:  Stuart Hill is a 50 y.o. very pleasant male patient who presents with the following:  Virtual visit today for medication follow-up.  Patient last seen by myself last month- history of anxiety and depression, sleep apnea, secondary polycythemia  Patient location is home, my location is office.  Patient identity confirmed with 2 factors, he gives consent for virtual visit today.  Patient and myself are present on the call today  Stuart Hill has had to struggle with anxiety and depression, exacerbated by social/economic factors Per my last A/P: Stuart Hill is here today with a few concerns.  Unfortunately, he does have a significant history of psychological trauma as well as some health problems and financial constraints. He notes some urinary symptoms, no evidence of infection on urine dip.  Did not do culture due to cost.  We will have him try Flomax, he will let us know if helpful He notes significant symptoms of depression.  He is sleeping too much, feels tired and has poor appetite.  I asked him to DC trazodone, will have him try Effexor instead.  I have asked him to keep me closely updated as to how he is feeling, and to seek care immediately if he is not safe  We also ordered a CT chest at last visit due to concern of smoking, weight loss and hemoptysis Thankfully this was normal except for emphysema  Patient Active Problem List   Diagnosis Date Noted  . DOE (dyspnea on exertion) 12/14/2019  . Angina pectoris ([place]) 11/03/2019  . Tobacco abuse 11/03/2019  . Other hyperlipidemia 11/03/2019  . Tonsillar hypertrophy 04/30/2017  . Obstructive sleep apnea 04/23/2017  .  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 Polycythemia vera ([place]) 05/03/2015  . Hemoptysis 04/03/2015  . Condylomata acuminata 01/18/2015  . Generalized anxiety disorder 03/05/2014    Past Medical History:  Diagnosis Date  . Allergy   . Anxiety   . Arthritis   . Bell's palsy   . Erythrocytosis 04/10/2015  . Hyperlipidemia   . Polycythemia vera ([place]) 05/03/2015    Past Surgical History:  Procedure Laterality Date  . DENTAL SURGERY    . HAND SURGERY    . TONSILLECTOMY Bilateral 04/30/2017   Procedure: TONSILLECTOMY;  Surgeon: Jerrell Belfast, MD;  Location: [place];  Service: ENT;  Laterality: Bilateral;    Social History   Tobacco Use  . Smoking status: Current Every Day Smoker    Packs/day: 1.00    Years: 24.00    Pack years: 24.00    Types: Cigarettes  . Smokeless tobacco: Never Used  Vaping Use  . Vaping Use: Never used  Substance Use Topics  . Alcohol use: Yes    Alcohol/week: 1.0 standard drink    Types: 1 Cans of beer per week    Comment: 6-10 beer a week  . Drug use: Yes    Types: Other-see comments, Marijuana    Comment: Marijuana every other day.    Family History  Problem Relation Age of Onset  . COPD Mother   . Fibromyalgia Mother   . Heart disease Father   . Stroke Father   .  Depression Brother   . Heart disease Brother   . Heart disease Maternal Grandmother   . Stroke Maternal Grandmother   . Heart disease Maternal Grandfather   . Stroke Maternal Grandfather   . Heart disease Paternal Grandmother   . Stroke Paternal Grandmother   . Heart disease Paternal Grandfather   . Stroke Paternal Grandfather   . Stroke Paternal Aunt     Allergies  Allergen Reactions  . Plavix [Clopidogrel Bisulfate] Other (See Comments)    Confusion, lethargy, slurred speech  . Afrin [Nasal Spray] Hives  . Antihistamines, Chlorpheniramine-Type Hives  . Benadryl [Diphenhydramine Hcl] Hives  . Buprenorphine Hcl Hives  . Carisoprodol Hives  . Codeine  Hives  . Metoclopramide Hives  . Morphine And Related Hives  . Oxymetazoline Hives  . Pheniramine Hives and Rash    Medication list has been reviewed and updated.  Current Outpatient Medications on File Prior to Visit  Medication Sig Dispense Refill  . albuterol (VENTOLIN HFA) 108 (90 Base) MCG/ACT inhaler Inhale 1-2 puffs into the lungs every 6 (six) hours as needed for wheezing or shortness of breath. 18 g 3  . ALPRAZolam (XANAX) 1 MG tablet TAKE 1 TABLET BY MOUTH THREE TIMES A DAY AS NEEDED FOR ANXIETY 90 tablet 3  . nitroGLYCERIN (NITROSTAT) 0.4 MG SL tablet Place 1 tablet (0.4 mg total) under the tongue every 5 (five) minutes as needed for chest pain. 25 tablet 6  . sildenafil (REVATIO) 20 MG tablet TAKE 1-3 TABLETS BY MOUTH PRIOR TO SEXUAL ACTIVITY AS NEEDED 30 tablet 5  . tamsulosin (FLOMAX) 0.4 MG CAPS capsule Take 1 capsule (0.4 mg total) by mouth daily. 30 capsule 3  . traMADol (ULTRAM) 50 MG tablet TAKE 1 BY MOUTH DAILY AS NEEDED FOR BACK PAIN 30 tablet 0  . venlafaxine XR (EFFEXOR XR) 75 MG 24 hr capsule Take 1 capsule (75 mg total) by mouth daily with breakfast. 30 capsule 4   No current facility-administered medications on file prior to visit.    Review of Systems:  As per HPI- otherwise negative.   Physical Examination: There were no vitals filed for this visit. There were no vitals filed for this visit. There is no height or weight on file to calculate BMI. Ideal Body Weight:       Assessment and Plan: ***  Signed Lamar Blinks, MD

## 2020-05-08 ENCOUNTER — Ambulatory Visit: Payer: Self-pay | Admitting: Family Medicine

## 2020-05-11 NOTE — Patient Instructions (Incomplete)
Great to see you again today!

## 2020-05-11 NOTE — Progress Notes (Deleted)
[place] at [facility] [address], [place], [place] [address] [phone]  Date:  05/15/2020   Name:  [place] Hill   DOB:  05/01/1970   MRN:  [id number]  PCP:  Darreld Mclean, MD    Chief Complaint: No chief complaint on file.   History of Present Illness:  Stuart Hill is a 50 y.o. very pleasant male patient who presents with the following:  Here today for a follow-up visit Last seen by myself recently- about 6 weeks ago  History of anxiety and depression, sleep apnea, secondary polycythemia Married to [place], he has a Freight forwarder.  [place] accompanies him to his visit today  From our last visit:  Stuart Hill is here today with a few concerns.  Unfortunately, he does have a significant history of psychological trauma as well as some health problems and financial constraints. He notes some urinary symptoms, no evidence of infection on urine dip.  Did not do culture due to cost.  We will have him try Flomax, he will let us know if helpful He notes significant symptoms of depression.  He is sleeping too much, feels tired and has poor appetite.  I asked him to DC trazodone, will have him try Effexor instead.  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  He has lost weight and is having hemoptysis.  We decided to order a CT chest with contrast, he will check on self-pay cost  CT chest was negative 3/11  Patient Active Problem List   Diagnosis Date Noted  . DOE (dyspnea on exertion) 12/14/2019  . Angina pectoris ([place]) 11/03/2019  . Tobacco abuse 11/03/2019  . Other hyperlipidemia 11/03/2019  . Tonsillar hypertrophy 04/30/2017  . Obstructive sleep apnea 04/23/2017  . 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 Polycythemia vera ([place])  05/03/2015  . Hemoptysis 04/03/2015  . Condylomata acuminata 01/18/2015  . Generalized anxiety disorder 03/05/2014    Past Medical History:  Diagnosis Date  . Allergy   . Anxiety   . Arthritis   . Bell's palsy   . Erythrocytosis 04/10/2015  . Hyperlipidemia   . Polycythemia vera ([place]) 05/03/2015    Past Surgical History:  Procedure Laterality Date  . DENTAL SURGERY    . HAND SURGERY    . TONSILLECTOMY Bilateral 04/30/2017   Procedure: TONSILLECTOMY;  Surgeon: Jerrell Belfast, MD;  Location: [facility];  Service: ENT;  Laterality: Bilateral;    Social History   Tobacco Use  . Smoking status: Current Every Day Smoker    Packs/day: 1.00    Years: 24.00    Pack years: 24.00    Types: Cigarettes  . Smokeless tobacco: Never Used  Vaping Use  . Vaping Use: Never used  Substance Use Topics  . Alcohol use: Yes    Alcohol/week: 1.0 standard drink    Types: 1 Cans of beer per week    Comment: 6-10 beer a week  . Drug use: Yes    Types: Other-see comments, Marijuana    Comment: Marijuana every other day.    Family History  Problem Relation Age of Onset  . COPD Mother   . Fibromyalgia Mother   . Heart disease Father   . Stroke Father   . Depression  Brother   . Heart disease Brother   . Heart disease Maternal Grandmother   . Stroke Maternal Grandmother   . Heart disease Maternal Grandfather   . Stroke Maternal Grandfather   . Heart disease Paternal Grandmother   . Stroke Paternal Grandmother   . Heart disease Paternal Grandfather   . Stroke Paternal Grandfather   . Stroke Paternal Aunt     Allergies  Allergen Reactions  . Plavix [Clopidogrel Bisulfate] Other (See Comments)    Confusion, lethargy, slurred speech  . Afrin [Nasal Spray] Hives  . Antihistamines, Chlorpheniramine-Type Hives  . Benadryl [Diphenhydramine Hcl] Hives  . Buprenorphine Hcl Hives  . Carisoprodol Hives  . Codeine Hives  . Metoclopramide Hives  . Morphine And Related Hives   . Oxymetazoline Hives  . Pheniramine Hives and Rash    Medication list has been reviewed and updated.  Current Outpatient Medications on File Prior to Visit  Medication Sig Dispense Refill  . albuterol (VENTOLIN HFA) 108 (90 Base) MCG/ACT inhaler Inhale 1-2 puffs into the lungs every 6 (six) hours as needed for wheezing or shortness of breath. 18 g 3  . ALPRAZolam (XANAX) 1 MG tablet TAKE 1 TABLET BY MOUTH THREE TIMES A DAY AS NEEDED FOR ANXIETY 90 tablet 3  . nitroGLYCERIN (NITROSTAT) 0.4 MG SL tablet Place 1 tablet (0.4 mg total) under the tongue every 5 (five) minutes as needed for chest pain. 25 tablet 6  . sildenafil (REVATIO) 20 MG tablet TAKE 1-3 TABLETS BY MOUTH PRIOR TO SEXUAL ACTIVITY AS NEEDED 30 tablet 5  . tamsulosin (FLOMAX) 0.4 MG CAPS capsule Take 1 capsule (0.4 mg total) by mouth daily. 30 capsule 3  . traMADol (ULTRAM) 50 MG tablet TAKE 1 BY MOUTH DAILY AS NEEDED FOR BACK PAIN 30 tablet 0  . venlafaxine XR (EFFEXOR XR) 75 MG 24 hr capsule Take 1 capsule (75 mg total) by mouth daily with breakfast. 30 capsule 4   No current facility-administered medications on file prior to visit.    Review of Systems:  As per HPI- otherwise negative.   Physical Examination: There were no vitals filed for this visit. There were no vitals filed for this visit. There is no height or weight on file to calculate BMI. Ideal Body Weight:    GEN: no acute distress. HEENT: Atraumatic, Normocephalic.  Ears and Nose: No external deformity. CV: RRR, No M/G/R. No JVD. No thrill. No extra heart sounds. PULM: CTA B, no wheezes, crackles, rhonchi. No retractions. No resp. distress. No accessory muscle use. ABD: S, NT, ND, +BS. No rebound. No HSM. EXTR: No c/c/e PSYCH: Normally interactive. Conversant.    Assessment and Plan: *** This visit occurred during the SARS-CoV-2 public health emergency.  Safety protocols were in place, including screening questions prior to the visit, additional  usage of staff PPE, and extensive cleaning of exam room while observing appropriate contact time as indicated for disinfecting solutions.    Signed Lamar Blinks, MD

## 2020-05-15 ENCOUNTER — Ambulatory Visit: Payer: Self-pay | Admitting: Family Medicine

## 2020-05-20 ENCOUNTER — Encounter: Payer: Self-pay | Admitting: Family Medicine

## 2020-05-27 ENCOUNTER — Encounter: Payer: Self-pay | Admitting: Family Medicine

## 2020-05-28 ENCOUNTER — Other Ambulatory Visit: Payer: Self-pay | Admitting: Family Medicine

## 2020-05-28 DIAGNOSIS — F411 Generalized anxiety disorder: Secondary | ICD-10-CM

## 2020-05-31 ENCOUNTER — Encounter: Payer: Self-pay | Admitting: Family Medicine

## 2020-05-31 MED ORDER — SPIRIVA HANDIHALER 18 MCG IN CAPS: 18.0000 ug | ORAL_CAPSULE | Freq: Every day | RESPIRATORY_TRACT | 12 refills | Status: DC

## 2020-06-17 ENCOUNTER — Inpatient Hospital Stay: Payer: Self-pay | Attending: Family

## 2020-06-17 ENCOUNTER — Other Ambulatory Visit: Payer: Self-pay

## 2020-06-17 ENCOUNTER — Inpatient Hospital Stay: Payer: Self-pay

## 2020-06-17 ENCOUNTER — Inpatient Hospital Stay (HOSPITAL_BASED_OUTPATIENT_CLINIC_OR_DEPARTMENT_OTHER): Payer: Self-pay | Admitting: Family

## 2020-06-17 ENCOUNTER — Encounter: Payer: Self-pay | Admitting: Family

## 2020-06-17 VITALS — BP 128/87 | HR 71 | Resp 17

## 2020-06-17 VITALS — BP 128/80 | HR 70 | Temp 97.8°F | Resp 18 | Ht 75.0 in | Wt 179.0 lb

## 2020-06-17 DIAGNOSIS — D500 Iron deficiency anemia secondary to blood loss (chronic): Secondary | ICD-10-CM

## 2020-06-17 DIAGNOSIS — D751 Secondary polycythemia: Secondary | ICD-10-CM | POA: Insufficient documentation

## 2020-06-17 DIAGNOSIS — F17210 Nicotine dependence, cigarettes, uncomplicated: Secondary | ICD-10-CM | POA: Insufficient documentation

## 2020-06-17 LAB — CBC WITH DIFFERENTIAL (CANCER CENTER ONLY)
Abs Immature Granulocytes: 0.04 10*3/uL (ref 0.00–0.07)
Basophils Absolute: 0.1 10*3/uL (ref 0.0–0.1)
Basophils Relative: 1 %
Eosinophils Absolute: 0.1 10*3/uL (ref 0.0–0.5)
Eosinophils Relative: 1 %
HCT: 50.4 % (ref 39.0–52.0)
Hemoglobin: 16.9 g/dL (ref 13.0–17.0)
Immature Granulocytes: 0 %
Lymphocytes Relative: 13 %
Lymphs Abs: 1.5 10*3/uL (ref 0.7–4.0)
MCH: 30 pg (ref 26.0–34.0)
MCHC: 33.5 g/dL (ref 30.0–36.0)
MCV: 89.5 fL (ref 80.0–100.0)
Monocytes Absolute: 0.7 10*3/uL (ref 0.1–1.0)
Monocytes Relative: 6 %
Neutro Abs: 9.2 10*3/uL — ABNORMAL HIGH (ref 1.7–7.7)
Neutrophils Relative %: 79 %
Platelet Count: 255 10*3/uL (ref 150–400)
RBC: 5.63 MIL/uL (ref 4.22–5.81)
RDW: 13.1 % (ref 11.5–15.5)
WBC Count: 11.7 10*3/uL — ABNORMAL HIGH (ref 4.0–10.5)
nRBC: 0 % (ref 0.0–0.2)

## 2020-06-17 LAB — CMP (CANCER CENTER ONLY)
ALT: 12 U/L (ref 0–44)
AST: 16 U/L (ref 15–41)
Albumin: 4.5 g/dL (ref 3.5–5.0)
Alkaline Phosphatase: 51 U/L (ref 38–126)
Anion gap: 5 (ref 5–15)
BUN: 13 mg/dL (ref 6–20)
CO2: 30 mmol/L (ref 22–32)
Calcium: 9.8 mg/dL (ref 8.9–10.3)
Chloride: 102 mmol/L (ref 98–111)
Creatinine: 0.92 mg/dL (ref 0.61–1.24)
GFR, Estimated: >60 mL/min (ref >60)
Glucose, Bld: 95 mg/dL (ref 70–99)
Potassium: 4.5 mmol/L (ref 3.5–5.1)
Sodium: 137 mmol/L (ref 135–145)
Total Bilirubin: 0.5 mg/dL (ref 0.3–1.2)
Total Protein: 7.5 g/dL (ref 6.5–8.1)

## 2020-06-17 NOTE — Progress Notes (Signed)
Hematology and Oncology Follow Up Visit  [place] [id number] 07-11-1970 50 y.o. 06/17/2020   Principle Diagnosis:  Secondary polycythemia JAK2 (-), smoker  LEFT Bell's palsy  Current Therapy: Phlebotomy to maintain hematocrit below 45% 2 baby aspirin daily at bedtime- not taking per patient   Interim History:  St[place] is here today for follow-up and phlebotomy. His Hct today is 50.4%. He states that he has smoked a little more today than usual while in the car running errands.  No blood loss noted. No bruising or petechiae.  He denies fever, chills, n/v, cough, rash, dizziness, SOB, chest palpitations, abdominal pain or changes in bowel or bladder habits.  No swelling, tenderness, numbness or tingling in her extremities.  No falls or syncope.  He has maintained a good appetite and is staying well hydrated. Her weight is stable.   ECOG Performance Status: 1 - Symptomatic but completely ambulatory  Medications:  Allergies as of 06/17/2020      Reactions   Plavix [clopidogrel Bisulfate] Other (See Comments)   Confusion, lethargy, slurred speech   Afrin [nasal Spray] Hives   Antihistamines, Chlorpheniramine-type Hives   Benadryl [diphenhydramine Hcl] Hives   Buprenorphine Hcl Hives   Carisoprodol Hives   Codeine Hives   Metoclopramide Hives   Morphine And Related Hives   Oxymetazoline Hives   Pheniramine Hives, Rash      Medication List       Accurate as of Jun 17, 2020 12:29 PM. If you have any questions, ask your nurse or doctor.        albuterol 108 (90 Base) MCG/ACT inhaler Commonly known as: VENTOLIN HFA Inhale 1-2 puffs into the lungs every 6 (six) hours as needed for wheezing or shortness of breath.   ALPRAZolam 1 MG tablet Commonly known as: XANAX TAKE 1 TABLET BY MOUTH THREE TIMES A DAY AS NEEDED FOR ANXIETY   nitroGLYCERIN 0.4 MG SL tablet Commonly known as: NITROSTAT Place 1 tablet (0.4 mg total) under the tongue every 5  (five) minutes as needed for chest pain.   sildenafil 20 MG tablet Commonly known as: REVATIO TAKE 1-3 TABLETS BY MOUTH PRIOR TO SEXUAL ACTIVITY AS NEEDED   Spiriva HandiHaler 18 MCG inhalation capsule Generic drug: tiotropium Place 1 capsule (18 mcg total) into inhaler and inhale daily.   tamsulosin 0.4 MG Caps capsule Commonly known as: FLOMAX Take 1 capsule (0.4 mg total) by mouth daily.   traMADol 50 MG tablet Commonly known as: ULTRAM TAKE 1 BY MOUTH DAILY AS NEEDED FOR BACK PAIN   venlafaxine XR 75 MG 24 hr capsule Commonly known as: Effexor XR Take 1 capsule (75 mg total) by mouth daily with breakfast.       Allergies:  Allergies  Allergen Reactions  . Plavix [Clopidogrel Bisulfate] Other (See Comments)    Confusion, lethargy, slurred speech  . Afrin [Nasal Spray] Hives  . Antihistamines, Chlorpheniramine-Type Hives  . Benadryl [Diphenhydramine Hcl] Hives  . Buprenorphine Hcl Hives  . Carisoprodol Hives  . Codeine Hives  . Metoclopramide Hives  . Morphine And Related Hives  . Oxymetazoline Hives  . Pheniramine Hives and Rash    Past Medical History, Surgical history, Social history, and Family History were reviewed and updated.  Review of Systems: All other 10 point review of systems is negative.   Physical Exam:  vitals were not taken for this visit.   Wt Readings from Last 3 Encounters:  04/03/20 174 lb (78.9 kg)  02/23/20 175 lb 1.3 oz (  79.4 kg)  01/04/20 184 lb 12.8 oz (83.8 kg)    Ocular: Sclerae unicteric, pupils equal, round and reactive to light Ear-nose-throat: Oropharynx clear, dentition fair Lymphatic: No cervical or supraclavicular adenopathy Lungs no rales or rhonchi, good excursion bilaterally Heart regular rate and rhythm, no murmur appreciated Abd soft, nontender, positive bowel sounds MSK no focal spinal tenderness, no joint edema Neuro: non-focal, well-oriented, appropriate affect Breasts: Deferred  Lab Results  Component  Value Date   WBC 11.7 (H) 06/17/2020   HGB 16.9 06/17/2020   HCT 50.4 06/17/2020   MCV 89.5 06/17/2020   PLT 255 06/17/2020   Lab Results  Component Value Date   FERRITIN 9 (L) 04/17/2020   IRON 101 04/17/2020   TIBC 468 (H) 04/17/2020   UIBC 367 04/17/2020   IRONPCTSAT 22 04/17/2020   Lab Results  Component Value Date   RETICCTPCT 1.0 12/20/2017   RBC 5.63 06/17/2020   No results found for: KPAFRELGTCHN, LAMBDASER, KAPLAMBRATIO No results found for: IGGSERUM, IGA, IGMSERUM No results found for: Kathrynn Ducking, MSPIKE, SPEI   Chemistry      Component Value Date/Time   NA 141 04/17/2020 1313   NA 143 01/01/2017 1453   NA 139 11/12/2015 0945   K 4.9 04/17/2020 1313   K 4.3 01/01/2017 1453   K 3.7 11/12/2015 0945   CL 103 04/17/2020 1313   CL 103 01/01/2017 1453   CO2 34 (H) 04/17/2020 1313   CO2 27 01/01/2017 1453   CO2 22 11/12/2015 0945   BUN 14 04/17/2020 1313   BUN 18 01/01/2017 1453   BUN 13.1 11/12/2015 0945   CREATININE 0.91 04/17/2020 1313   CREATININE 0.96 10/05/2019 1042   CREATININE 0.9 11/12/2015 0945      Component Value Date/Time   CALCIUM 9.6 04/17/2020 1313   CALCIUM 8.8 01/01/2017 1453   CALCIUM 8.9 11/12/2015 0945   ALKPHOS 51 04/17/2020 1313   ALKPHOS 44 01/01/2017 1453   ALKPHOS 66 11/12/2015 0945   AST 15 04/17/2020 1313   AST 13 11/12/2015 0945   ALT 14 04/17/2020 1313   ALT 368 (HH) 01/01/2017 1453   ALT 14 11/12/2015 0945   BILITOT 0.6 04/17/2020 1313   BILITOT <0.22 11/12/2015 0945       Impression and Plan: [place] is a 50yo caucasian male with history of secondary polycythemia, JAK2 negative, smoker. We will proceed with phlebotomy today as planned.  Lab every 8 weeks with phlebotomy (if needed) and follow-up in 4 months.  He can contact our office with any questions or concerns.   [name], NP 5/23/202212:29 PM

## 2020-06-17 NOTE — Progress Notes (Signed)
St[place] presents today for phlebotomy per MD orders. Phlebotomy procedure started at 1:14 PM and ended at 1:27 PM  603 grams removed via 18 gauge needle to right AC.  Patient tolerated procedure well. Patient understands to call if he has any questions or concerns post discharge.

## 2020-06-17 NOTE — Patient Instructions (Signed)

## 2020-06-18 LAB — IRON AND TIBC
Iron: 82 ug/dL (ref 42–163)
Saturation Ratios: 18 % — ABNORMAL LOW (ref 20–55)
TIBC: 460 ug/dL — ABNORMAL HIGH (ref 202–409)
UIBC: 378 ug/dL — ABNORMAL HIGH (ref 117–376)

## 2020-06-18 LAB — FERRITIN: Ferritin: 15 ng/mL — ABNORMAL LOW (ref 24–336)

## 2020-06-27 ENCOUNTER — Encounter: Payer: Self-pay | Admitting: Family Medicine

## 2020-06-27 ENCOUNTER — Ambulatory Visit (INDEPENDENT_AMBULATORY_CARE_PROVIDER_SITE_OTHER): Payer: Self-pay | Admitting: Family Medicine

## 2020-06-27 ENCOUNTER — Other Ambulatory Visit: Payer: Self-pay

## 2020-06-27 ENCOUNTER — Ambulatory Visit
Admission: RE | Admit: 2020-06-27 | Discharge: 2020-06-27 | Disposition: A | Discharge status: Home / Self Care | Payer: No Typology Code available for payment source | Source: Ambulatory Visit | Attending: Family Medicine | Admitting: Family Medicine

## 2020-06-27 ENCOUNTER — Encounter: Payer: Self-pay | Admitting: Family

## 2020-06-27 VITALS — BP 118/70 | HR 64 | Temp 98.0°F | Resp 17 | Ht 75.0 in | Wt 179.0 lb

## 2020-06-27 DIAGNOSIS — R059 Cough, unspecified: Secondary | ICD-10-CM

## 2020-06-27 MED ORDER — PREDNISONE 20 MG PO TABS: ORAL_TABLET | ORAL | 0 refills | Status: DC

## 2020-06-27 MED ORDER — DOXYCYCLINE HYCLATE 100 MG PO CAPS: 100.0000 mg | ORAL_CAPSULE | Freq: Two times a day (BID) | ORAL | 0 refills | Status: DC

## 2020-06-27 NOTE — Progress Notes (Signed)
[place] at [facility] [address], [place], [place] [address] [phone]  Date:  06/27/2020   Name:  [place]   DOB:  21-Jul-1970   MRN:  [id number]  PCP:  Darreld Mclean, MD    Chief Complaint: Cough (Burning in chest, leg edema, pressure with lying down, productive of clear mucus, shortness of breath)   History of Present Illness:  [place] is a 50 y.o. very pleasant male patient who presents with the following:  Pt who I last saw in March- history of anxiety and depression, sleep apnea, secondary polycythemia Married to [place], he has a Freight forwarder.  [place] accompanies him to his visit today  He likely also does have COPD/ emphysema from recent CT chest -current smoker Today [place] has concern of illness- he has noted a cough for about 2 weeks, seemed to get significantly worse yesterday He worked for several hours outside in the heat yesterday-it was a very hot day, mid 90s After he got in from working he was wheezing  He took a cool shower and felt a bit better-however, today he continues to have cough and wheezing  He notes he is coughing up a lot of clear mucus in the am No fever He has chronic back pain but nothing different here His mother had COPD   He is using spiriva- he is not sure if helping that much yet, he started 3 weeks ago He used albuterol this am and it did help  His eyes seemed very bloodshot yesterday after he got in from working He may gag from coughing so hard but no vomiting otherwise No diarrhea  He did not self test for covid, is not vaccinated    Patient Active Problem List   Diagnosis Date Noted  . DOE (dyspnea on exertion) 12/14/2019  . Angina pectoris ([place]) 11/03/2019  . Tobacco abuse 11/03/2019  . Other hyperlipidemia 11/03/2019  . Tonsillar hypertrophy 04/30/2017  . Obstructive sleep apnea 04/23/2017  . 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 Polycythemia  vera ([place]) 05/03/2015  . Hemoptysis 04/03/2015  . Condylomata acuminata 01/18/2015  . Generalized anxiety disorder 03/05/2014    Past Medical History:  Diagnosis Date  . Allergy   . Anxiety   . Arthritis   . Bell's palsy   . Erythrocytosis 04/10/2015  . Hyperlipidemia   . Polycythemia vera ([place]) 05/03/2015    Past Surgical History:  Procedure Laterality Date  . DENTAL SURGERY    . HAND SURGERY    . TONSILLECTOMY Bilateral 04/30/2017   Procedure: TONSILLECTOMY;  Surgeon: Jerrell Belfast, MD;  Location: [facility];  Service: ENT;  Laterality: Bilateral;    Social History   Tobacco Use  . Smoking status: Current Every Day Smoker    Packs/day: 1.00    Years: 24.00    Pack years: 24.00    Types: Cigarettes  . Smokeless tobacco: Never Used  Vaping Use  . Vaping Use: Never used  Substance Use Topics  . Alcohol use: Yes    Alcohol/week: 1.0 standard drink    Types: 1 Cans of beer per week    Comment: 6-10 beer a week  . Drug use: Yes    Types: Other-see comments, Marijuana    Comment: Marijuana every other day.    Family History  Problem Relation Age of Onset  . COPD Mother   . Fibromyalgia Mother   . Heart disease Father   .  Stroke Father   . Depression Brother   . Heart disease Brother   . Heart disease Maternal Grandmother   . Stroke Maternal Grandmother   . Heart disease Maternal Grandfather   . Stroke Maternal Grandfather   . Heart disease Paternal Grandmother   . Stroke Paternal Grandmother   . Heart disease Paternal Grandfather   . Stroke Paternal Grandfather   . Stroke Paternal Aunt     Allergies  Allergen Reactions  . Plavix [Clopidogrel Bisulfate] Other (See Comments)    Confusion, lethargy, slurred speech  . Afrin [Nasal Spray] Hives  . Antihistamines, Chlorpheniramine-Type Hives  . Benadryl [Diphenhydramine Hcl] Hives  . Buprenorphine Hcl Hives  . Carisoprodol Hives  . Codeine Hives  . Metoclopramide Hives  . Morphine And  Related Hives  . Oxymetazoline Hives  . Pheniramine Hives and Rash    Medication list has been reviewed and updated.  Current Outpatient Medications on File Prior to Visit  Medication Sig Dispense Refill  . albuterol (VENTOLIN HFA) 108 (90 Base) MCG/ACT inhaler Inhale 1-2 puffs into the lungs every 6 (six) hours as needed for wheezing or shortness of breath. 18 g 3  . ALPRAZolam (XANAX) 1 MG tablet TAKE 1 TABLET BY MOUTH THREE TIMES A DAY AS NEEDED FOR ANXIETY 90 tablet 2  . nitroGLYCERIN (NITROSTAT) 0.4 MG SL tablet Place 1 tablet (0.4 mg total) under the tongue every 5 (five) minutes as needed for chest pain. 25 tablet 6  . sildenafil (REVATIO) 20 MG tablet TAKE 1-3 TABLETS BY MOUTH PRIOR TO SEXUAL ACTIVITY AS NEEDED 30 tablet 5  . tamsulosin (FLOMAX) 0.4 MG CAPS capsule Take 1 capsule (0.4 mg total) by mouth daily. 30 capsule 3  . tiotropium (SPIRIVA HANDIHALER) 18 MCG inhalation capsule Place 1 capsule (18 mcg total) into inhaler and inhale daily. 30 capsule 12  . traMADol (ULTRAM) 50 MG tablet TAKE 1 BY MOUTH DAILY AS NEEDED FOR BACK PAIN 30 tablet 0   No current facility-administered medications on file prior to visit.    Review of Systems:  As per HPI- otherwise negative.   Physical Examination: Vitals:   06/27/20 1435  BP: 118/70  Pulse: 64  Resp: 17  Temp: 98 F (36.7 C)  SpO2: 98%   Vitals:   06/27/20 1435  Weight: 179 lb (81.2 kg)  Height: 6\' 3"  (1.905 m)   Body mass index is 22.37 kg/m. Ideal Body Weight: Weight in (lb) to have BMI = 25: 199.6  GEN: no acute distress.  Normal weight, appears his normal self  Bilateral TM wnl, oropharynx normal.  PEERL,EOMI.   HEENT: Atraumatic, Normocephalic.  Ears and Nose: No external deformity. CV: RRR, No M/G/R. No JVD. No thrill. No extra heart sounds. PULM: CTA B, no wheezes, crackles, rhonchi. No retractions. No resp. distress. No accessory muscle use. ABD: S, NT, ND, +BS. No rebound. No HSM. EXTR: No c/c/e PSYCH:  Normally interactive. Conversant.    Assessment and Plan: Cough - Plan: doxycycline (VIBRAMYCIN) 100 MG capsule, predniSONE (DELTASONE) 20 MG tablet, DG Chest 2 View, Novel Coronavirus, NAA ([facility])   Brantly is here today with concern of cough, he does likely have COPD and certainly could have exacerbation from exposure to very hot temperatures.  We will start him on doxycycline and also prednisone.  I sent him for COVID-19 today, PCR pending.  We will also obtain a chest film today  His occupation is landscaping, I encouraged him to avoid working outdoors during the hottest parts of  the day as much as he is able.  We also encouraged him to continue working on smoking cessation  Will plan further follow- up pending labs and chest film  I asked him to let me know if not feeling better over the next couple of days, sooner if worse Meds ordered this encounter  Medications  . doxycycline (VIBRAMYCIN) 100 MG capsule    Sig: Take 1 capsule (100 mg total) by mouth 2 (two) times daily.    Dispense:  20 capsule    Refill:  0  . predniSONE (DELTASONE) 20 MG tablet    Sig: Take 40 mg daily for 3 days , then 20 mg daily for 3 days    Dispense:  9 tablet    Refill:  0    This visit occurred during the SARS-CoV-2 public health emergency.  Safety protocols were in place, including screening questions prior to the visit, additional usage of staff PPE, and extensive cleaning of exam room while observing appropriate contact time as indicated for disinfecting solutions.     Signed Lamar Blinks, MD Received chest film as below, message to patient  DG Chest 2 View  Result Date: 06/27/2020 CLINICAL DATA:  Cough and chest pain. EXAM: CHEST - 2 VIEW COMPARISON:  CT chest 03/20/2016.  PA and lateral chest 10/25/2019. FINDINGS: Lungs clear. Heart size normal. No pneumothorax or pleural fluid. No acute or focal bony abnormality. IMPRESSION: Negative chest. Electronically Signed   By: Inge Rise M.D.    On: 06/27/2020 15:46

## 2020-06-27 NOTE — Patient Instructions (Signed)
It was good to see you today- I will be in touch with your covid results (generally 48 hours) and your chest film Please go by [facility] on [place] (by [employer] [address]) to have your chest film rx for prednisone and doxycycline in case this is COPD  Please try to reduce your exposure to heat- try to avoid working outdoors when it is very hot if you can!    Please let me know if you are not getting better in the next couple of days

## 2020-06-28 ENCOUNTER — Encounter: Payer: Self-pay | Admitting: Family Medicine

## 2020-06-28 DIAGNOSIS — R059 Cough, unspecified: Secondary | ICD-10-CM

## 2020-06-28 LAB — NOVEL CORONAVIRUS, NAA: SARS-CoV-2, NAA: NOT DETECTED

## 2020-06-28 LAB — SARS-COV-2, NAA 2 DAY TAT

## 2020-07-22 MED ORDER — DOXYCYCLINE HYCLATE 100 MG PO CAPS: 100.0000 mg | ORAL_CAPSULE | Freq: Two times a day (BID) | ORAL | 0 refills | Status: DC

## 2020-07-26 ENCOUNTER — Encounter: Payer: Self-pay | Admitting: Family

## 2020-07-26 ENCOUNTER — Ambulatory Visit (INDEPENDENT_AMBULATORY_CARE_PROVIDER_SITE_OTHER): Payer: Self-pay | Admitting: Family Medicine

## 2020-07-26 ENCOUNTER — Other Ambulatory Visit: Payer: Self-pay

## 2020-07-26 ENCOUNTER — Encounter: Payer: Self-pay | Admitting: Family Medicine

## 2020-07-26 VITALS — BP 140/80 | HR 69 | Temp 98.0°F | Resp 18 | Ht 75.0 in | Wt 186.8 lb

## 2020-07-26 DIAGNOSIS — R079 Chest pain, unspecified: Secondary | ICD-10-CM

## 2020-07-26 DIAGNOSIS — Z8249 Family history of ischemic heart disease and other diseases of the circulatory system: Secondary | ICD-10-CM

## 2020-07-26 LAB — COMPREHENSIVE METABOLIC PANEL
ALT: 14 U/L (ref 0–53)
AST: 13 U/L (ref 0–37)
Albumin: 4.4 g/dL (ref 3.5–5.2)
Alkaline Phosphatase: 51 U/L (ref 39–117)
BUN: 14 mg/dL (ref 6–23)
CO2: 30 mEq/L (ref 19–32)
Calcium: 9.5 mg/dL (ref 8.4–10.5)
Chloride: 103 mEq/L (ref 96–112)
Creatinine, Ser: 0.9 mg/dL (ref 0.40–1.50)
GFR: 101.24 mL/min (ref >60.00)
Glucose, Bld: 87 mg/dL (ref 70–99)
Potassium: 4.8 mEq/L (ref 3.5–5.1)
Sodium: 140 mEq/L (ref 135–145)
Total Bilirubin: 0.5 mg/dL (ref 0.2–1.2)
Total Protein: 7 g/dL (ref 6.0–8.3)

## 2020-07-26 LAB — LIPID PANEL
Cholesterol: 205 mg/dL — ABNORMAL HIGH (ref 0–200)
HDL: 75.8 mg/dL (ref >39.00)
LDL Cholesterol: 94 mg/dL (ref 0–99)
NonHDL: 129.52
Total CHOL/HDL Ratio: 3
Triglycerides: 178 mg/dL — ABNORMAL HIGH (ref 0.0–149.0)
VLDL: 35.6 mg/dL (ref 0.0–40.0)

## 2020-07-26 NOTE — Assessment & Plan Note (Signed)
Pt referred to cardiology Echo Treadmill stress neg ekg--- sinus same as 9/21

## 2020-07-26 NOTE — Progress Notes (Signed)
Patient ID: Stuart Hill, male    DOB: [birth year]  Age: 50 y.o. MRN: [id number]    Subjective:  Subjective  HPI DOLPHUS LINCH presents for an office visit today accompanied by [name]. He complains of cough, SOB, light-headedness, chest pain, chest tightness, and vomiting x 1 month. He notes that EMS was called and and EKG was performed. On the day of the incident he notes that he was on his knee since he felt like he was going to pass out. The EKG was at baseline. He notes that prior to the EKG was performed he was under stressed hunch over position on the floor. He states that treadmill stress test was done and was normal .  Pt has FMHx of COPD, heart disease, stroke, and angina. He notes that his brother died at the age of 39.from MI.  Pt is currently on self payment and no longer has insurance. Pt is unable to received a nuclear stress test due to insurance and problem getting it covered  He denies any fever, abdominal pain, chills, sore throat, dysuria, urinary incontinence, back pain, or HA at this time.   Review of Systems  Constitutional:  Negative for chills and fever.  HENT:  Negative for ear pain, rhinorrhea, sinus pressure, sinus pain, sore throat and tinnitus.   Eyes:  Negative for pain.  Respiratory:  Positive for cough, chest tightness and shortness of breath.   Cardiovascular:  Positive for chest pain.  Gastrointestinal:  Positive for vomiting. Negative for abdominal pain, anal bleeding, constipation and diarrhea.  Genitourinary:  Negative for flank pain.  Musculoskeletal:  Negative for back pain and neck pain.  Skin:  Negative for rash.  Neurological:  Positive for light-headedness. Negative for seizures, weakness, numbness and headaches.   History Past Medical History:  Diagnosis Date   Allergy    Anxiety    Arthritis    Bell's palsy    Erythrocytosis 04/10/2015   Hyperlipidemia    Polycythemia vera ([place]) 05/03/2015    He has a past surgical history that includes  Dental surgery; Hand surgery; and Tonsillectomy (Bilateral, 04/30/2017).   His family history includes COPD in his mother; Depression in his brother; Fibromyalgia in his mother; Heart disease in his brother, father, maternal grandfather, maternal grandmother, paternal grandfather, and paternal grandmother; Stroke in his father, maternal grandfather, maternal grandmother, paternal aunt, paternal grandfather, and paternal grandmother.He reports that he has been smoking cigarettes. He has a 24.00 pack-year smoking history. He has never used smokeless tobacco. He reports current alcohol use of about 1.0 standard drink of alcohol per week. He reports current drug use. Drugs: Other-see comments and Marijuana.  Current Outpatient Medications on File Prior to Visit  Medication Sig Dispense Refill   albuterol (VENTOLIN HFA) 108 (90 Base) MCG/ACT inhaler Inhale 1-2 puffs into the lungs every 6 (six) hours as needed for wheezing or shortness of breath. 18 g 3   ALPRAZolam (XANAX) 1 MG tablet TAKE 1 TABLET BY MOUTH THREE TIMES A DAY AS NEEDED FOR ANXIETY 90 tablet 2   doxycycline (VIBRAMYCIN) 100 MG capsule Take 1 capsule (100 mg total) by mouth 2 (two) times daily. 10 capsule 0   nitroGLYCERIN (NITROSTAT) 0.4 MG SL tablet Place 1 tablet (0.4 mg total) under the tongue every 5 (five) minutes as needed for chest pain. 25 tablet 6   predniSONE (DELTASONE) 20 MG tablet Take 40 mg daily for 3 days , then 20 mg daily for 3 days 9 tablet 0  sildenafil (REVATIO) 20 MG tablet TAKE 1-3 TABLETS BY MOUTH PRIOR TO SEXUAL ACTIVITY AS NEEDED 30 tablet 5   tamsulosin (FLOMAX) 0.4 MG CAPS capsule Take 1 capsule (0.4 mg total) by mouth daily. 30 capsule 3   tiotropium (SPIRIVA HANDIHALER) 18 MCG inhalation capsule Place 1 capsule (18 mcg total) into inhaler and inhale daily. 30 capsule 12   traMADol (ULTRAM) 50 MG tablet TAKE 1 BY MOUTH DAILY AS NEEDED FOR BACK PAIN 30 tablet 0   No current facility-administered medications on  file prior to visit.     Objective:  Objective  Physical Exam Vitals and nursing note reviewed.  Constitutional:      General: He is not in acute distress.    Appearance: Normal appearance. He is well-developed. He is not ill-appearing.  HENT:     Head: Normocephalic and atraumatic.     Right Ear: External ear normal.     Left Ear: External ear normal.     Nose: Nose normal.  Eyes:     General:        Right eye: No discharge.        Left eye: No discharge.     Extraocular Movements: Extraocular movements intact.     Pupils: Pupils are equal, round, and reactive to light.  Cardiovascular:     Rate and Rhythm: Normal rate and regular rhythm.     Pulses: Normal pulses.     Heart sounds: Normal heart sounds. No murmur heard.   No friction rub. No gallop.  Pulmonary:     Effort: Pulmonary effort is normal. No respiratory distress.     Breath sounds: Normal breath sounds. No stridor. No wheezing, rhonchi or rales.  Chest:     Chest wall: No tenderness.  Abdominal:     General: Bowel sounds are normal. There is no distension.     Palpations: Abdomen is soft. There is no mass.     Tenderness: There is no abdominal tenderness. There is no guarding or rebound.     Hernia: No hernia is present.  Musculoskeletal:        General: Normal range of motion.     Cervical back: Normal range of motion and neck supple.     Right lower leg: No edema.     Left lower leg: No edema.  Skin:    General: Skin is warm and dry.  Neurological:     Mental Status: He is alert and oriented to person, place, and time.  Psychiatric:        Behavior: Behavior normal.        Thought Content: Thought content normal.   BP 140/80 (BP Location: Right Arm, Patient Position: Sitting, Cuff Size: Normal)   Pulse 69   Temp 98 F (36.7 C) (Oral)   Resp 18   Ht 6\' 3"  (1.905 m)   Wt 186 lb 12.8 oz (84.7 kg)   SpO2 97%   BMI 23.35 kg/m  Wt Readings from Last 3 Encounters:  07/26/20 186 lb 12.8 oz (84.7 kg)   06/27/20 179 lb (81.2 kg)  06/17/20 179 lb (81.2 kg)     Lab Results  Component Value Date   WBC 11.7 (H) 06/17/2020   HGB 16.9 06/17/2020   HCT 50.4 06/17/2020   PLT 255 06/17/2020   GLUCOSE 95 06/17/2020   CHOL 203 (H) 11/03/2019   TRIG 93 11/03/2019   HDL 65 11/03/2019   LDLCALC 121 (H) 11/03/2019   ALT 12 06/17/2020  AST 16 06/17/2020   NA 137 06/17/2020   K 4.5 06/17/2020   CL 102 06/17/2020   CREATININE 0.92 06/17/2020   BUN 13 06/17/2020   CO2 30 06/17/2020   TSH 1.23 10/05/2019   INR 0.98 09/01/2015    DG Chest 2 View  Result Date: 06/27/2020 CLINICAL DATA:  Cough and chest pain. EXAM: CHEST - 2 VIEW COMPARISON:  CT chest 03/20/2016.  PA and lateral chest 10/25/2019. FINDINGS: Lungs clear. Heart size normal. No pneumothorax or pleural fluid. No acute or focal bony abnormality. IMPRESSION: Negative chest. Electronically Signed   By: Inge Rise M.D.   On: 06/27/2020 15:46     Assessment & Plan:  Plan   No orders of the defined types were placed in this encounter.   Problem List Items Addressed This Visit       Unprioritized   Chest pain    Pt referred to cardiology Echo Treadmill stress neg ekg--- sinus same as 9/21       Relevant Orders   Ambulatory referral to Cardiology   ECHOCARDIOGRAM COMPLETE   EKG 12-Lead (Completed)   Lipid panel   Comprehensive metabolic panel   Family history of early CAD - Primary   Relevant Orders   Ambulatory referral to Cardiology   ECHOCARDIOGRAM COMPLETE   Lipid panel   Comprehensive metabolic panel    Follow-up: Return in about 4 weeks (around 08/23/2020), or if symptoms worsen or fail to improve, for with pcp.   I,Gordon Zheng,acting as a Education administrator for [employer], DO.,have documented all relevant documentation on the behalf of Ann Held, DO,as directed by  Ann Held, DO while in the presence of [place], DO, have reviewed all  documentation for this visit. The documentation on 07/26/20 for the exam, diagnosis, procedures, and orders are all accurate and complete.

## 2020-07-26 NOTE — Patient Instructions (Signed)
Chest Wall Pain Chest wall pain is pain in or around the bones and muscles of your chest. Sometimes, an injury causes this pain. Excessive coughing or overuse of arm and chest muscles may also cause chest wall pain. Sometimes, the cause may not beknown. This pain may take several weeks or longer to get better. Follow these instructions at home: Managing pain, stiffness, and swelling  If directed, put ice on the painful area: Put ice in a plastic bag. Place a towel between your skin and the bag. Leave the ice on for 20 minutes, 2-3 times per day.  Activity Rest as told by your health care provider. Avoid activities that cause pain. These include any activities that use your chest muscles or your abdominal and side muscles to lift heavy items. Ask your health care provider what activities are safe for you. General instructions  Take over-the-counter and prescription medicines only as told by your health care provider. Do not use any products that contain nicotine or tobacco, such as cigarettes, e-cigarettes, and chewing tobacco. These can delay healing after injury. If you need help quitting, ask your health care provider. Keep all follow-up visits as told by your health care provider. This is important.  Contact a health care provider if: You have a fever. Your chest pain becomes worse. You have new symptoms. Get help right away if: You have nausea or vomiting. You feel sweaty or light-headed. You have a cough with mucus from your lungs (sputum) or you cough up blood. You develop shortness of breath. These symptoms may represent a serious problem that is an emergency. Do not wait to see if the symptoms will go away. Get medical help right away. Call your local emergency services (911 in the [place] Do not drive yourself to the hospital. Summary Chest wall pain is pain in or around the bones and muscles of your chest. Depending on the cause, it may be treated with ice, rest, medicines,  and avoiding activities that cause pain. Contact a health care provider if you have a fever, worsening chest pain, or new symptoms. Get help right away if you feel light-headed or you develop shortness of breath. These symptoms may be an emergency. This information is not intended to replace advice given to you by your health care provider. Make sure you discuss any questions you have with your healthcare provider. Document Revised: 07/15/2017 Document Reviewed: 07/15/2017 Elsevier Patient Education  2022 Elsevier Inc.  

## 2020-07-30 ENCOUNTER — Encounter: Payer: Self-pay | Admitting: Family Medicine

## 2020-07-30 MED ORDER — ROSUVASTATIN CALCIUM 10 MG PO TABS
10.0000 mg | ORAL_TABLET | Freq: Every day | ORAL | 2 refills | Status: DC
Start: 2020-07-30 — End: 2020-12-16

## 2020-07-31 ENCOUNTER — Telehealth: Payer: Self-pay

## 2020-07-31 NOTE — Telephone Encounter (Signed)
Caller states he missed a call yesterday, he is returning the call.  Telephone: [phone]

## 2020-07-31 NOTE — Telephone Encounter (Signed)
Looks like call was regarding labs. See Mychart message

## 2020-08-12 ENCOUNTER — Inpatient Hospital Stay: Payer: Self-pay | Attending: Family

## 2020-08-12 ENCOUNTER — Other Ambulatory Visit: Payer: Self-pay

## 2020-08-12 ENCOUNTER — Inpatient Hospital Stay: Payer: Self-pay

## 2020-08-12 VITALS — BP 123/78 | HR 67 | Temp 97.7°F | Resp 18

## 2020-08-12 DIAGNOSIS — D751 Secondary polycythemia: Secondary | ICD-10-CM

## 2020-08-12 DIAGNOSIS — D500 Iron deficiency anemia secondary to blood loss (chronic): Secondary | ICD-10-CM

## 2020-08-12 DIAGNOSIS — F17210 Nicotine dependence, cigarettes, uncomplicated: Secondary | ICD-10-CM | POA: Insufficient documentation

## 2020-08-12 LAB — CBC WITH DIFFERENTIAL (CANCER CENTER ONLY)
Abs Immature Granulocytes: 0.07 10*3/uL (ref 0.00–0.07)
Basophils Absolute: 0.1 10*3/uL (ref 0.0–0.1)
Basophils Relative: 1 %
Eosinophils Absolute: 0.2 10*3/uL (ref 0.0–0.5)
Eosinophils Relative: 2 %
HCT: 46.2 % (ref 39.0–52.0)
Hemoglobin: 15.7 g/dL (ref 13.0–17.0)
Immature Granulocytes: 1 %
Lymphocytes Relative: 18 %
Lymphs Abs: 1.8 10*3/uL (ref 0.7–4.0)
MCH: 30.1 pg (ref 26.0–34.0)
MCHC: 34 g/dL (ref 30.0–36.0)
MCV: 88.7 fL (ref 80.0–100.0)
Monocytes Absolute: 0.8 10*3/uL (ref 0.1–1.0)
Monocytes Relative: 8 %
Neutro Abs: 6.9 10*3/uL (ref 1.7–7.7)
Neutrophils Relative %: 70 %
Platelet Count: 250 10*3/uL (ref 150–400)
RBC: 5.21 MIL/uL (ref 4.22–5.81)
RDW: 13.2 % (ref 11.5–15.5)
WBC Count: 9.7 10*3/uL (ref 4.0–10.5)
nRBC: 0 % (ref 0.0–0.2)

## 2020-08-12 LAB — RETICULOCYTES
Immature Retic Fract: 6.2 % (ref 2.3–15.9)
RBC.: 5.22 MIL/uL (ref 4.22–5.81)
Retic Count, Absolute: 68.9 10*3/uL (ref 19.0–186.0)
Retic Ct Pct: 1.3 % (ref 0.4–3.1)

## 2020-08-12 NOTE — Progress Notes (Signed)
Stuart Hill presents today for phlebotomy per MD orders. Phlebotomy procedure started at 1325 and ended at 1335. 510 grams removed via 16 gauge needle to right AC. Patient declined to stay for the post procedure observation period. Pt stated he has tolerated procedure multiple times in the past without difficulty.  Patient tolerated procedure well. IV needle removed intact.

## 2020-08-13 LAB — IRON AND TIBC
Iron: 43 ug/dL (ref 42–163)
Saturation Ratios: 11 % — ABNORMAL LOW (ref 20–55)
TIBC: 403 ug/dL (ref 202–409)
UIBC: 360 ug/dL (ref 117–376)

## 2020-08-13 LAB — FERRITIN: Ferritin: 21 ng/mL — ABNORMAL LOW (ref 24–336)

## 2020-08-19 DIAGNOSIS — M1990 Unspecified osteoarthritis, unspecified site: Secondary | ICD-10-CM | POA: Insufficient documentation

## 2020-08-19 DIAGNOSIS — G510 Bell's palsy: Secondary | ICD-10-CM | POA: Insufficient documentation

## 2020-08-19 DIAGNOSIS — F419 Anxiety disorder, unspecified: Secondary | ICD-10-CM | POA: Insufficient documentation

## 2020-08-19 DIAGNOSIS — T7840XA Allergy, unspecified, initial encounter: Secondary | ICD-10-CM | POA: Insufficient documentation

## 2020-08-29 ENCOUNTER — Other Ambulatory Visit: Payer: Self-pay

## 2020-08-29 ENCOUNTER — Other Ambulatory Visit: Payer: Self-pay | Admitting: Family Medicine

## 2020-08-29 ENCOUNTER — Ambulatory Visit (INDEPENDENT_AMBULATORY_CARE_PROVIDER_SITE_OTHER): Payer: Self-pay | Admitting: Cardiology

## 2020-08-29 VITALS — BP 116/80 | HR 60 | Ht 75.0 in | Wt 181.0 lb

## 2020-08-29 DIAGNOSIS — R9431 Abnormal electrocardiogram [ECG] [EKG]: Secondary | ICD-10-CM

## 2020-08-29 DIAGNOSIS — R0600 Dyspnea, unspecified: Secondary | ICD-10-CM

## 2020-08-29 DIAGNOSIS — Z8249 Family history of ischemic heart disease and other diseases of the circulatory system: Secondary | ICD-10-CM

## 2020-08-29 DIAGNOSIS — F32A Depression, unspecified: Secondary | ICD-10-CM

## 2020-08-29 DIAGNOSIS — R0609 Other forms of dyspnea: Secondary | ICD-10-CM

## 2020-08-29 DIAGNOSIS — F4310 Post-traumatic stress disorder, unspecified: Secondary | ICD-10-CM

## 2020-08-29 DIAGNOSIS — F411 Generalized anxiety disorder: Secondary | ICD-10-CM

## 2020-08-29 DIAGNOSIS — R0789 Other chest pain: Secondary | ICD-10-CM

## 2020-08-29 DIAGNOSIS — R5383 Other fatigue: Secondary | ICD-10-CM

## 2020-08-29 DIAGNOSIS — R0602 Shortness of breath: Secondary | ICD-10-CM

## 2020-08-29 MED ORDER — METOPROLOL TARTRATE 50 MG PO TABS: ORAL_TABLET | ORAL | 0 refills | Status: DC

## 2020-08-29 NOTE — Patient Instructions (Signed)
Medication Instructions:  Your physician recommends that you continue on your current medications as directed. Please refer to the Current Medication list given to you today.  *If you need a refill on your cardiac medications before your next appointment, please call your pharmacy*   Lab Work: Your physician recommends that you return for lab work in:  TODAY: TSH If you have labs (blood work) drawn today and your tests are completely normal, you will receive your results only by: [place] (if you have MyChart) OR A paper copy in the mail If you have any lab test that is abnormal or we need to change your treatment, we will call you to review the results.   Testing/Procedures: Your physician has requested that you have an echocardiogram. Echocardiography is a painless test that uses sound waves to create images of your heart. It provides your doctor with information about the size and shape of your heart and how well your heart's chambers and valves are working. This procedure takes approximately one hour. There are no restrictions for this procedure.    Your cardiac CT will be scheduled at one of the below locations:   [facility] [address] [place], [place] [address] [phone]    If scheduled at [facility], please arrive at the [facility] main entrance (entrance A) of [facility] 30 minutes prior to test start time. Proceed to the [facility] Radiology Department (first floor) to check-in and test prep.   Please follow these instructions carefully (unless otherwise directed):  On the Night Before the Test: Be sure to Drink plenty of water. Do not consume any caffeinated/decaffeinated beverages or chocolate 12 hours prior to your test. Do not take any antihistamines 12 hours prior to your test.   On the Day of the Test: Drink plenty of water until 1 hour prior to the test. Do not eat any food 4 hours prior to the test. You may  take your regular medications prior to the test.  Take metoprolol (Lopressor) two hours prior to test.       After the Test: Drink plenty of water. After receiving IV contrast, you may experience a mild flushed feeling. This is normal. On occasion, you may experience a mild rash up to 24 hours after the test. This is not dangerous. If this occurs, you can take Benadryl 25 mg and increase your fluid intake. If you experience trouble breathing, this can be serious. If it is severe call 911 IMMEDIATELY. If it is mild, please call our office. If you take any of these medications: Glipizide/Metformin, Avandament, Glucavance, please do not take 48 hours after completing test unless otherwise instructed.  Please allow 2-4 weeks for scheduling of routine cardiac CTs. Some insurance companies require a pre-authorization which may delay scheduling of this test.   For non-scheduling related questions, please contact the cardiac imaging nurse navigator should you have any questions/concerns: Marchia Bond, Cardiac Imaging Nurse Navigator Gordy Clement, Cardiac Imaging Nurse Navigator [facility] Direct Office Dial: [phone]   For scheduling needs, including cancellations and rescheduling, please call [place], [phone].    Follow-Up: At [facility], you and your health needs are our priority.  As part of our continuing mission to provide you with exceptional heart care, we have created designated Provider Care Teams.  These Care Teams include your primary Cardiologist (physician) and Advanced Practice Providers (APPs -  Physician Assistants and Nurse Practitioners) who all work together to  provide you with the care you need, when you need it.  We recommend signing up for the patient portal called "MyChart".  Sign up information is provided on this After Visit Summary.  MyChart is used to connect with patients for Virtual Visits (Telemedicine).  Patients are able to  view lab/test results, encounter notes, upcoming appointments, etc.  Non-urgent messages can be sent to your provider as well.   To learn more about what you can do with MyChart, go to [email or web address].    Your next appointment:   4 month(s)  The format for your next appointment:   In Person  Provider:   [id number], [place], [place] [address]    Other Instructions  Echocardiogram An echocardiogram is a test that uses sound waves (ultrasound) to produce images of the heart. Images from an echocardiogram can provide important information about: Heart size and shape. The size and thickness and movement of your heart's walls. Heart muscle function and strength. Heart valve function or if you have stenosis. Stenosis is when the heart valves are too narrow. If blood is flowing backward through the heart valves (regurgitation). A tumor or infectious growth around the heart valves. Areas of heart muscle that are not working well because of poor blood flow or injury from a heart attack. Aneurysm detection. An aneurysm is a weak or damaged part of an artery wall. The wall bulges out from the normal force of blood pumping through the body. Tell a health care provider about: Any allergies you have. All medicines you are taking, including vitamins, herbs, eye drops, creams, and over-the-counter medicines. Any blood disorders you have. Any surgeries you have had. Any medical conditions you have. Whether you are pregnant or may be pregnant. What are the risks? Generally, this is a safe test. However, problems may occur, including an allergic reaction to dye (contrast) that may be used during the test. What happens before the test? No specific preparation is needed. You may eat and drink normally. What happens during the test?  You will take off your clothes from the waist up and put on a hospital gown. Electrodes or electrocardiogram  (ECG)patches may be placed on your chest. The electrodes or patches are then connected to a device that monitors your heart rate and rhythm. You will lie down on a table for an ultrasound exam. A gel will be applied to your chest to help sound waves pass through your skin. A handheld device, called a transducer, will be pressed against your chest and moved over your heart. The transducer produces sound waves that travel to your heart and bounce back (or "echo" back) to the transducer. These sound waves will be captured in real-time and changed into images of your heart that can be viewed on a video monitor. The images will be recorded on a computer and reviewed by your health care provider. You may be asked to change positions or hold your breath for a short time. This makes it easier to get different views or better views of your heart. In some cases, you may receive contrast through an IV in one of your veins. This can improve the quality of the pictures from your heart. The procedure may vary among health care providers and hospitals. What can I expect after the test? You may return to your normal, everyday life, including diet, activities, andmedicines, unless your health care provider tells you not to do that. Follow these instructions at home:  It is up to you to get the results of your test. Ask your health care provider, or the department that is doing the test, when your results will be ready. Keep all follow-up visits. This is important. Summary An echocardiogram is a test that uses sound waves (ultrasound) to produce images of the heart. Images from an echocardiogram can provide important information about the size and shape of your heart, heart muscle function, heart valve function, and other possible heart problems. You do not need to do anything to prepare before this test. You may eat and drink normally. After the echocardiogram is completed, you may return to your normal, everyday life,  unless your health care provider tells you not to do that. This information is not intended to replace advice given to you by your health care provider. Make sure you discuss any questions you have with your healthcare provider. Document Revised: 09/05/2019 Document Reviewed: 09/05/2019 Elsevier Patient Education  2022 [employer].

## 2020-08-29 NOTE — Progress Notes (Signed)
Cardiology Office Note:    Date:  08/29/2020   ID:  Stuart Hill, DOB 09-14-1970, MRN [id number]  PCP:  Stuart Mclean, MD  Cardiologist:  None  Electrophysiologist:  None   Referring MD: Ann Held, *   Chief Complaint  Patient presents with   Chest Pain    History of Present Illness:    Stuart Hill is a 50 y.o. male with a hx of COPD recently diagnosed, hyperlipidemia, polycythemia vera, premature family history of coronary artery disease with his brother dying of a massive MI in his late 57s.  Current smoker is here today to be evaluated at the request of his PCP for chest pain.  Patient tells me that he has been experiencing intermittent chest discomfort.  He describes it as a tightness sensation.  He notes that when he expresses he feels significantly short of breath.  The shortness of breath He tells me has been going on for a while as well.  He notes that he does does very little and then gets significantly winded.  He is worried about this.  Despite being recently diagnosed with COPD and using inhalers he feels like her shortness of breath is different.  No other complaints at this time.  Of note he is teary in the office today when he talked about some posttraumatic events and tells me that he knows that he is depressed.  Past Medical History:  Diagnosis Date   Allergy    Anxiety    Arthritis    Bell's palsy    Erythrocytosis 04/10/2015   Hyperlipidemia    Polycythemia vera ([place]) 05/03/2015    Past Surgical History:  Procedure Laterality Date   DENTAL SURGERY     HAND SURGERY     TONSILLECTOMY Bilateral 04/30/2017   Procedure: TONSILLECTOMY;  Surgeon: Stuart Belfast, MD;  Location: [facility];  Service: ENT;  Laterality: Bilateral;    Current Medications: Current Meds  Medication Sig   albuterol (VENTOLIN HFA) 108 (90 Base) MCG/ACT inhaler Inhale 1-2 puffs into the lungs every 6 (six) hours as needed for wheezing or shortness  of breath.   metoprolol tartrate (LOPRESSOR) 50 MG tablet Take 2 hours before CT scan   nitroGLYCERIN (NITROSTAT) 0.4 MG SL tablet Place 1 tablet (0.4 mg total) under the tongue every 5 (five) minutes as needed for chest pain.   rosuvastatin (CRESTOR) 10 MG tablet Take 1 tablet (10 mg total) by mouth at bedtime.   sildenafil (REVATIO) 20 MG tablet TAKE 1-3 TABLETS BY MOUTH PRIOR TO SEXUAL ACTIVITY AS NEEDED (Patient taking differently: Take 20 mg by mouth as needed (ed).)   traMADol (ULTRAM) 50 MG tablet TAKE 1 BY MOUTH DAILY AS NEEDED FOR BACK PAIN (Patient taking differently: Take 50 mg by mouth as needed. Take 1 by mouth daily as needed for back pain)   [DISCONTINUED] ALPRAZolam (XANAX) 1 MG tablet TAKE 1 TABLET BY MOUTH THREE TIMES A DAY AS NEEDED FOR ANXIETY (Patient taking differently: Take 1 mg by mouth 3 (three) times daily as needed for anxiety. TAKE 1 TABLET BY MOUTH THREE TIMES A DAY AS NEEDED FOR ANXIETY)     Allergies:   Plavix [clopidogrel bisulfate]; Afrin [nasal spray]; Antihistamines, chlorpheniramine-type; Benadryl [diphenhydramine hcl]; Buprenorphine hcl; Carisoprodol; Codeine; Metoclopramide; Morphine and related; Oxymetazoline; and Pheniramine   Social History   Socioeconomic History   Marital status: Married    Spouse name: Not on file   Number of children: Not on file  Years of education: Not on file   Highest education level: Not on file  Occupational History   Not on file  Tobacco Use   Smoking status: Every Day    Packs/day: 1.00    Years: 24.00    Pack years: 24.00    Types: Cigarettes   Smokeless tobacco: Never  Vaping Use   Vaping Use: Never used  Substance and Sexual Activity   Alcohol use: Yes    Alcohol/week: 1.0 standard drink    Types: 1 Cans of beer per week    Comment: 6-10 beer a week   Drug use: Yes    Types: Other-see comments, Marijuana    Comment: Marijuana every other day.   Sexual activity: Yes  Other Topics Concern   Not on file   Social History Narrative   Not on file   Social Determinants of Health   Financial Resource Strain: Not on file  Food Insecurity: Not on file  Transportation Needs: Not on file  Physical Activity: Not on file  Stress: Not on file  Social Connections: Not on file     Family History: The patient's family history includes COPD in his mother; Depression in his brother; Fibromyalgia in his mother; Heart disease in his brother, father, maternal grandfather, maternal grandmother, paternal grandfather, and paternal grandmother; Stroke in his father, maternal grandfather, maternal grandmother, paternal aunt, paternal grandfather, and paternal grandmother.  ROS:   Review of Systems  Constitution: Negative for decreased appetite, fever and weight gain.  HENT: Negative for congestion, ear discharge, hoarse voice and sore throat.   Eyes: Negative for discharge, redness, vision loss in right eye and visual halos.  Cardiovascular: Reports chest pain, dyspnea on exertion,.  Negative for leg swelling, orthopnea and palpitations.  Respiratory: Negative for cough, hemoptysis, shortness of breath and snoring.   Endocrine: Negative for heat intolerance and polyphagia.  Hematologic/Lymphatic: Negative for bleeding problem. Does not bruise/bleed easily.  Skin: Negative for flushing, nail changes, rash and suspicious lesions.  Musculoskeletal: Negative for arthritis, joint pain, muscle cramps, myalgias, neck pain and stiffness.  Gastrointestinal: Negative for abdominal pain, bowel incontinence, diarrhea and excessive appetite.  Genitourinary: Negative for decreased libido, genital sores and incomplete emptying.  Neurological: Negative for brief paralysis, focal weakness, headaches and loss of balance.  Psychiatric/Behavioral: Negative for altered mental status, depression and suicidal ideas.  Allergic/Immunologic: Negative for HIV exposure and persistent infections.    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   Recent Labs: 10/05/2019: TSH 1.23 07/26/2020: ALT 14; BUN 14; Creatinine, Ser 0.90; Potassium 4.8; Sodium 140 08/12/2020: Hemoglobin 15.7; Platelet Count 250  Recent Lipid Panel    Component Value Date/Time   CHOL 205 (H) 07/26/2020 1131   CHOL 203 (H) 11/03/2019 1052   TRIG 178.0 (H) 07/26/2020 1131   HDL 75.80 07/26/2020 1131   HDL 65 11/03/2019 1052   CHOLHDL 3 07/26/2020 1131   VLDL 35.6 07/26/2020 1131   LDLCALC 94 07/26/2020 1131   LDLCALC 121 (H) 11/03/2019 1052    Physical Exam:    VS:  BP 116/80 (BP Location: Right Arm, Patient Position: Sitting)   Pulse 60   Ht '6\' 3"'$  (1.905 m)   Wt 181 lb (82.1 kg)   SpO2 99%   BMI 22.62 kg/m  Wt Readings from Last 3 Encounters:  08/29/20 181 lb (82.1 kg)  07/26/20 186 lb 12.8 oz (84.7 kg)  06/27/20 179 lb (81.2 kg)     GEN: Well nourished, well developed in no acute distress HEENT: Normal NECK: No JVD; No carotid bruits LYMPHATICS: No lymphadenopathy CARDIAC: S1S2 noted,RRR, no murmurs, rubs, gallops RESPIRATORY:  Clear to auscultation without rales, wheezing or rhonchi  ABDOMEN: Soft, non-tender, non-distended, +bowel sounds, no guarding. EXTREMITIES: No edema, No cyanosis, no clubbing MUSCULOSKELETAL:  No deformity  SKIN: Warm and dry, tattoo on skin NEUROLOGIC:  Alert and oriented x 3, non-focal PSYCHIATRIC:  Normal affect, good insight  ASSESSMENT:    1. Other chest pain   2. Nonspecific abnormal electrocardiogram (ECG) (EKG)   3. Family history of premature CAD   4. DOE (dyspnea on exertion)   5. Fatigue, unspecified type   6. SOB (shortness of breath)   7. Depression, unspecified depression type   8. PTSD (post-traumatic stress disorder)    PLAN:    The symptoms chest  pain is concerning along with his abnormal EKG, this patient does have intermediate risk for coronary artery disease and at this time I would like to pursue an ischemic evaluation in this patient.  Shared decision a coronary CTA at this time is appropriate.  I have discussed with the patient about the testing.  The patient has no IV contrast allergy and is agreeable to proceed with this test.  For his worsening dyspnea on exertion echocardiogram will also be done to assess LV/RV function and any other structure abnormalities.  Hyperlipidemia - continue with current statin medication.  He also has significant depression as well as pressurizer stress disorder melena refer the patient to our behavioral health partners for evaluation and treatment.  The patient is in agreement with the above plan. The patient left the office in stable condition.  The patient will follow up in 4 months or sooner if needed.   Medication Adjustments/Labs and Tests Ordered: Current medicines are reviewed at length with the patient today.  Concerns regarding medicines are outlined above.  Orders Placed This Encounter  Procedures   CT CORONARY MORPH W/CTA COR W/SCORE W/CA W/CM &/OR WO/CM   VITAMIN D 25 Hydroxy (Vit-D Deficiency, Fractures)   TSH   Basic metabolic panel   Magnesium   Ambulatory referral to Psychology   EKG 12-Lead   ECHOCARDIOGRAM COMPLETE    Meds ordered this encounter  Medications   metoprolol tartrate (LOPRESSOR) 50 MG tablet    Sig: Take 2 hours before CT scan    Dispense:  1 tablet    Refill:  0     Patient Instructions  Medication Instructions:  Your physician recommends that you continue on your current medications as directed. Please refer to the Current Medication list given to you today.  *If you need a refill on your cardiac medications before your next appointment, please call your pharmacy*   Lab Work: Your physician recommends that you return for lab work in:  TODAY:  TSH If you have labs (blood work) drawn today and your tests are completely normal, you will receive your results only by: [place] (if you have MyChart) OR A paper copy in the mail If you have any lab test that is abnormal or we need to change your treatment, we will call you to review the results.   Testing/Procedures: Your physician has requested that you have an echocardiogram. Echocardiography is a painless test that uses sound waves to  create images of your heart. It provides your doctor with information about the size and shape of your heart and how well your heart's chambers and valves are working. This procedure takes approximately one hour. There are no restrictions for this procedure.    Your cardiac CT will be scheduled at one of the below locations:   [facility] [address] [place], [place] [address] [phone]    If scheduled at [facility], please arrive at the [facility] main entrance (entrance A) of [facility] 30 minutes prior to test start time. Proceed to the [facility] Radiology Department (first floor) to check-in and test prep.   Please follow these instructions carefully (unless otherwise directed):  On the Night Before the Test: Be sure to Drink plenty of water. Do not consume any caffeinated/decaffeinated beverages or chocolate 12 hours prior to your test. Do not take any antihistamines 12 hours prior to your test.   On the Day of the Test: Drink plenty of water until 1 hour prior to the test. Do not eat any food 4 hours prior to the test. You may take your regular medications prior to the test.  Take metoprolol (Lopressor) two hours prior to test.       After the Test: Drink plenty of water. After receiving IV contrast, you may experience a mild flushed feeling. This is normal. On occasion, you may experience a mild rash up to 24 hours after the test. This is not dangerous. If this occurs, you can take  Benadryl 25 mg and increase your fluid intake. If you experience trouble breathing, this can be serious. If it is severe call 911 IMMEDIATELY. If it is mild, please call our office. If you take any of these medications: Glipizide/Metformin, Avandament, Glucavance, please do not take 48 hours after completing test unless otherwise instructed.  Please allow 2-4 weeks for scheduling of routine cardiac CTs. Some insurance companies require a pre-authorization which may delay scheduling of this test.   For non-scheduling related questions, please contact the cardiac imaging nurse navigator should you have any questions/concerns: Marchia Bond, Cardiac Imaging Nurse Navigator Gordy Clement, Cardiac Imaging Nurse Navigator [facility] Direct Office Dial: [phone]   For scheduling needs, including cancellations and rescheduling, please call [place], [phone].    Follow-Up: At [facility], you and your health needs are our priority.  As part of our continuing mission to provide you with exceptional heart care, we have created designated Provider Care Teams.  These Care Teams include your primary Cardiologist (physician) and Advanced Practice Providers (APPs -  Physician Assistants and Nurse Practitioners) who all work together to provide you with the care you need, when you need it.  We recommend signing up for the patient portal called "MyChart".  Sign up information is provided on this After Visit Summary.  MyChart is used to connect with patients for Virtual Visits (Telemedicine).  Patients are able to view lab/test results, encounter notes, upcoming appointments, etc.  Non-urgent messages can be sent to your provider as well.   To learn more about what you can do with MyChart, go to [email or web address].    Your next appointment:   4 month(s)  The format for your next appointment:   In Person  Provider:   [id number], [place],  [address]    Other Instructions  Echocardiogram An echocardiogram is a test that uses sound waves (ultrasound) to  produce images of the heart. Images from an echocardiogram can provide important information about: Heart size and shape. The size and thickness and movement of your heart's walls. Heart muscle function and strength. Heart valve function or if you have stenosis. Stenosis is when the heart valves are too narrow. If blood is flowing backward through the heart valves (regurgitation). A tumor or infectious growth around the heart valves. Areas of heart muscle that are not working well because of poor blood flow or injury from a heart attack. Aneurysm detection. An aneurysm is a weak or damaged part of an artery wall. The wall bulges out from the normal force of blood pumping through the body. Tell a health care provider about: Any allergies you have. All medicines you are taking, including vitamins, herbs, eye drops, creams, and over-the-counter medicines. Any blood disorders you have. Any surgeries you have had. Any medical conditions you have. Whether you are pregnant or may be pregnant. What are the risks? Generally, this is a safe test. However, problems may occur, including an allergic reaction to dye (contrast) that may be used during the test. What happens before the test? No specific preparation is needed. You may eat and drink normally. What happens during the test?  You will take off your clothes from the waist up and put on a hospital gown. Electrodes or electrocardiogram (ECG)patches may be placed on your chest. The electrodes or patches are then connected to a device that monitors your heart rate and rhythm. You will lie down on a table for an ultrasound exam. A gel will be applied to your chest to help sound waves pass through your skin. A handheld device, called a transducer, will be pressed against your chest and moved over your  heart. The transducer produces sound waves that travel to your heart and bounce back (or "echo" back) to the transducer. These sound waves will be captured in real-time and changed into images of your heart that can be viewed on a video monitor. The images will be recorded on a computer and reviewed by your health care provider. You may be asked to change positions or hold your breath for a short time. This makes it easier to get different views or better views of your heart. In some cases, you may receive contrast through an IV in one of your veins. This can improve the quality of the pictures from your heart. The procedure may vary among health care providers and hospitals. What can I expect after the test? You may return to your normal, everyday life, including diet, activities, andmedicines, unless your health care provider tells you not to do that. Follow these instructions at home: It is up to you to get the results of your test. Ask your health care provider, or the department that is doing the test, when your results will be ready. Keep all follow-up visits. This is important. Summary An echocardiogram is a test that uses sound waves (ultrasound) to produce images of the heart. Images from an echocardiogram can provide important information about the size and shape of your heart, heart muscle function, heart valve function, and other possible heart problems. You do not need to do anything to prepare before this test. You may eat and drink normally. After the echocardiogram is completed, you may return to your normal, everyday life, unless your health care provider tells you not to do that. This information is not intended to replace advice given to you by your health care provider. Make  sure you discuss any questions you have with your healthcare provider. Document Revised: 09/05/2019 Document Reviewed: 09/05/2019 Elsevier Patient Education  2022 [place].   Adopting a Healthy  Lifestyle.  Know what a healthy weight is for you (roughly BMI <25) and aim to maintain this   Aim for 7+ servings of fruits and vegetables daily   65-80+ fluid ounces of water or unsweet tea for healthy kidneys   Limit to max 1 drink of alcohol per day; avoid smoking/tobacco   Limit animal fats in diet for cholesterol and heart health - choose grass fed whenever available   Avoid highly processed foods, and foods high in saturated/trans fats   Aim for low stress - take time to unwind and care for your mental health   Aim for 150 min of moderate intensity exercise weekly for heart health, and weights twice weekly for bone health   Aim for 7-9 hours of sleep daily   When it comes to diets, agreement about the perfect plan isnt easy to find, even among the experts. Experts at the [employer] developed an idea known as the Healthy Eating Plate. Just imagine a plate divided into logical, healthy portions.   The emphasis is on diet quality:   Load up on vegetables and fruits - one-half of your plate: Aim for color and variety, and remember that potatoes dont count.   Go for whole grains - one-quarter of your plate: Whole wheat, barley, wheat berries, quinoa, oats, brown rice, and foods made with them. If you want pasta, go with whole wheat pasta.   Protein power - one-quarter of your plate: Fish, chicken, beans, and nuts are all healthy, versatile protein sources. Limit red meat.   The diet, however, does go beyond the plate, offering a few other suggestions.   Use healthy plant oils, such as olive, canola, soy, corn, sunflower and peanut. Check the labels, and avoid partially hydrogenated oil, which have unhealthy trans fats.   If youre thirsty, drink water. Coffee and tea are good in moderation, but skip sugary drinks and limit milk and dairy products to one or two daily servings.   The type of carbohydrate in the diet is more important than the amount. Some  sources of carbohydrates, such as vegetables, fruits, whole grains, and beans-are healthier than others.   Finally, stay active  Signed, Berniece Salines, DO  08/29/2020 9:03 PM    [place]

## 2020-08-29 NOTE — Telephone Encounter (Signed)
Requesting: alprazolam Last Visit: 07/26/20 Next Visit: none Last Refill: 05/29/20  Please Advise

## 2020-08-30 ENCOUNTER — Encounter: Payer: Self-pay | Admitting: Family

## 2020-08-30 LAB — VITAMIN D 25 HYDROXY (VIT D DEFICIENCY, FRACTURES): Vit D, 25-Hydroxy: 21 ng/mL — ABNORMAL LOW (ref 30.0–100.0)

## 2020-08-30 LAB — TSH: TSH: 0.6 u[IU]/mL (ref 0.450–4.500)

## 2020-09-02 ENCOUNTER — Telehealth: Payer: Self-pay

## 2020-09-02 ENCOUNTER — Ambulatory Visit (HOSPITAL_COMMUNITY)
Admission: RE | Admit: 2020-09-02 | Discharge: 2020-09-02 | Disposition: A | Discharge status: Home / Self Care | Payer: Self-pay | Source: Ambulatory Visit | Attending: Cardiology | Admitting: Cardiology

## 2020-09-02 ENCOUNTER — Other Ambulatory Visit: Payer: Self-pay

## 2020-09-02 DIAGNOSIS — E785 Hyperlipidemia, unspecified: Secondary | ICD-10-CM | POA: Insufficient documentation

## 2020-09-02 DIAGNOSIS — R5383 Other fatigue: Secondary | ICD-10-CM | POA: Insufficient documentation

## 2020-09-02 DIAGNOSIS — F17200 Nicotine dependence, unspecified, uncomplicated: Secondary | ICD-10-CM | POA: Insufficient documentation

## 2020-09-02 DIAGNOSIS — R0602 Shortness of breath: Secondary | ICD-10-CM | POA: Insufficient documentation

## 2020-09-02 LAB — ECHOCARDIOGRAM COMPLETE
Area-P 1/2: 3.42 cm2
Calc EF: 59.6 %
S' Lateral: 3.9 cm
Single Plane A2C EF: 59.3 %
Single Plane A4C EF: 56.2 %

## 2020-09-02 MED ORDER — VITAMIN D (ERGOCALCIFEROL) 1.25 MG (50000 UNIT) PO CAPS: 50000.0000 [IU] | ORAL_CAPSULE | ORAL | 0 refills | Status: DC

## 2020-09-02 NOTE — Telephone Encounter (Signed)
-----   Message from Berniece Salines, DO sent at 09/01/2020  7:46 PM EDT ----- Vitamin D is low-suggesting vitamin D deficiency.  We will start you on vitamin D 50,000 unit once a week.

## 2020-09-02 NOTE — Telephone Encounter (Signed)
Spoke with [name], notified of results and recommendation. Rx sent to confirmed pharmacy

## 2020-09-02 NOTE — Progress Notes (Signed)
  Echocardiogram 2D Echocardiogram has been performed.  Stuart Hill 09/02/2020, 2:49 PM

## 2020-09-03 ENCOUNTER — Telehealth: Payer: Self-pay | Admitting: Physician Assistant

## 2020-09-03 NOTE — Telephone Encounter (Signed)
   Pt wife is very concerned about his echo report. 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  She wants to know what to do.  Explained that he does not need to do anything now.  He needs to control his risk factors, including not smoking, limit ETOH and control BP.   BP has been controlled, but he smokes. He does drink ETOH.  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 Asked him to talk w/ Dr Harriet Masson about that.  Will route this to MD.  Rosaria Ferries, PA-C 09/03/2020 7:04 PM

## 2020-09-04 ENCOUNTER — Telehealth: Payer: Self-pay | Admitting: Cardiology

## 2020-09-04 NOTE — Telephone Encounter (Signed)
Follow up:    Patient returning a call back for results

## 2020-09-05 ENCOUNTER — Telehealth (HOSPITAL_COMMUNITY): Payer: Self-pay | Admitting: Emergency Medicine

## 2020-09-05 ENCOUNTER — Telehealth: Payer: Self-pay

## 2020-09-05 NOTE — Progress Notes (Signed)
Called them back, see telephone note.

## 2020-09-05 NOTE — Telephone Encounter (Signed)
Called patient and his wife to give then Dr. Terrial Rhodes recommendations. They verbalized understanding. No further questions or concerns expressed at this time. She requests after the CT to leave the results on the answering machine if they are normal.

## 2020-09-05 NOTE — Telephone Encounter (Signed)
Reaching out to patient to offer assistance regarding upcoming cardiac imaging study; pt verbalizes understanding of appt date/time, parking situation and where to check in, pre-test NPO status and medications ordered, and verified current allergies; name and call back number provided for further questions should they arise Marchia Bond RN Navigator Cardiac Imaging [facility] [phone] office [phone] cell  '100mg'$  metoprolol Some claustro- xanax Wife accompanying

## 2020-09-06 ENCOUNTER — Ambulatory Visit (HOSPITAL_COMMUNITY)
Admission: RE | Admit: 2020-09-06 | Discharge: 2020-09-06 | Disposition: A | Discharge status: Home / Self Care | Payer: Self-pay | Source: Ambulatory Visit | Attending: Cardiology | Admitting: Cardiology

## 2020-09-06 ENCOUNTER — Other Ambulatory Visit: Payer: Self-pay

## 2020-09-06 DIAGNOSIS — R0609 Other forms of dyspnea: Secondary | ICD-10-CM

## 2020-09-06 DIAGNOSIS — R9431 Abnormal electrocardiogram [ECG] [EKG]: Secondary | ICD-10-CM | POA: Insufficient documentation

## 2020-09-06 DIAGNOSIS — R0789 Other chest pain: Secondary | ICD-10-CM

## 2020-09-06 DIAGNOSIS — R0600 Dyspnea, unspecified: Secondary | ICD-10-CM | POA: Insufficient documentation

## 2020-09-06 MED ORDER — NITROGLYCERIN 0.4 MG SL SUBL
SUBLINGUAL_TABLET | SUBLINGUAL | Status: AC
  Administered 2020-09-06: 0.8 mg
  Filled 2020-09-06: qty 2

## 2020-09-06 MED ORDER — IOHEXOL 350 MG/ML SOLN
100.0000 mL | Freq: Once | INTRAVENOUS | Status: AC | PRN
  Administered 2020-09-06: 100 mL via INTRAVENOUS

## 2020-09-06 MED ORDER — NITROGLYCERIN 0.4 MG SL SUBL: 0.8000 mg | SUBLINGUAL_TABLET | Freq: Once | SUBLINGUAL | Status: DC

## 2020-09-06 NOTE — Progress Notes (Signed)
T scan completed. Tolerated well. D/C home ambulatory with wife. Awake and alert. In no distress

## 2020-09-10 ENCOUNTER — Telehealth: Payer: Self-pay

## 2020-09-10 NOTE — Progress Notes (Signed)
Dr. Harriet Masson spoke to patient and his wife earlier today. See chart.

## 2020-09-10 NOTE — Telephone Encounter (Signed)
Dr. Harriet Masson spoke with patient and his wife and answered several questions. No further questions at this time. They were encouraged to reach out with anymore questions they may have.

## 2020-10-07 ENCOUNTER — Inpatient Hospital Stay: Payer: Self-pay

## 2020-10-07 ENCOUNTER — Telehealth: Payer: Self-pay | Admitting: *Deleted

## 2020-10-07 ENCOUNTER — Inpatient Hospital Stay: Payer: Self-pay | Admitting: Family

## 2020-10-07 NOTE — Telephone Encounter (Signed)
Patient called to cancel appointment for 10/07/20 with Judson Roch. He said that he will call back to reschedule.

## 2020-10-23 ENCOUNTER — Inpatient Hospital Stay (HOSPITAL_BASED_OUTPATIENT_CLINIC_OR_DEPARTMENT_OTHER): Payer: Self-pay | Admitting: Family

## 2020-10-23 ENCOUNTER — Ambulatory Visit (HOSPITAL_COMMUNITY)
Admission: RE | Admit: 2020-10-23 | Discharge: 2020-10-23 | Disposition: A | Discharge status: Home / Self Care | Payer: Self-pay | Source: Ambulatory Visit | Attending: Family | Admitting: Family

## 2020-10-23 ENCOUNTER — Inpatient Hospital Stay: Payer: Self-pay

## 2020-10-23 ENCOUNTER — Inpatient Hospital Stay: Payer: Self-pay | Attending: Family

## 2020-10-23 ENCOUNTER — Encounter: Payer: Self-pay | Admitting: Family

## 2020-10-23 ENCOUNTER — Other Ambulatory Visit: Payer: Self-pay

## 2020-10-23 VITALS — BP 129/75 | HR 75

## 2020-10-23 VITALS — BP 147/99 | HR 74 | Temp 97.7°F | Resp 17 | Wt 186.1 lb

## 2020-10-23 DIAGNOSIS — F17210 Nicotine dependence, cigarettes, uncomplicated: Secondary | ICD-10-CM | POA: Insufficient documentation

## 2020-10-23 DIAGNOSIS — R4781 Slurred speech: Secondary | ICD-10-CM

## 2020-10-23 DIAGNOSIS — G510 Bell's palsy: Secondary | ICD-10-CM | POA: Insufficient documentation

## 2020-10-23 DIAGNOSIS — R200 Anesthesia of skin: Secondary | ICD-10-CM | POA: Insufficient documentation

## 2020-10-23 DIAGNOSIS — Z7982 Long term (current) use of aspirin: Secondary | ICD-10-CM | POA: Insufficient documentation

## 2020-10-23 DIAGNOSIS — D500 Iron deficiency anemia secondary to blood loss (chronic): Secondary | ICD-10-CM

## 2020-10-23 DIAGNOSIS — D751 Secondary polycythemia: Secondary | ICD-10-CM

## 2020-10-23 DIAGNOSIS — H538 Other visual disturbances: Secondary | ICD-10-CM | POA: Insufficient documentation

## 2020-10-23 DIAGNOSIS — R5383 Other fatigue: Secondary | ICD-10-CM | POA: Insufficient documentation

## 2020-10-23 LAB — CBC WITH DIFFERENTIAL (CANCER CENTER ONLY)
Abs Immature Granulocytes: 0.03 10*3/uL (ref 0.00–0.07)
Basophils Absolute: 0.1 10*3/uL (ref 0.0–0.1)
Basophils Relative: 1 %
Eosinophils Absolute: 0.1 10*3/uL (ref 0.0–0.5)
Eosinophils Relative: 1 %
HCT: 49.3 % (ref 39.0–52.0)
Hemoglobin: 16.5 g/dL (ref 13.0–17.0)
Immature Granulocytes: 0 %
Lymphocytes Relative: 15 %
Lymphs Abs: 1.6 10*3/uL (ref 0.7–4.0)
MCH: 29.9 pg (ref 26.0–34.0)
MCHC: 33.5 g/dL (ref 30.0–36.0)
MCV: 89.3 fL (ref 80.0–100.0)
Monocytes Absolute: 1 10*3/uL (ref 0.1–1.0)
Monocytes Relative: 9 %
Neutro Abs: 8.2 10*3/uL — ABNORMAL HIGH (ref 1.7–7.7)
Neutrophils Relative %: 74 %
Platelet Count: 252 10*3/uL (ref 150–400)
RBC: 5.52 MIL/uL (ref 4.22–5.81)
RDW: 13.2 % (ref 11.5–15.5)
WBC Count: 11 10*3/uL — ABNORMAL HIGH (ref 4.0–10.5)
nRBC: 0 % (ref 0.0–0.2)

## 2020-10-23 LAB — RETICULOCYTES
Immature Retic Fract: 6.5 % (ref 2.3–15.9)
RBC.: 5.52 MIL/uL (ref 4.22–5.81)
Retic Count, Absolute: 63.5 10*3/uL (ref 19.0–186.0)
Retic Ct Pct: 1.2 % (ref 0.4–3.1)

## 2020-10-23 MED ORDER — GADOBUTROL 1 MMOL/ML IV SOLN
8.0000 mL | Freq: Once | INTRAVENOUS | Status: AC | PRN
  Administered 2020-10-23: 8 mL via INTRAVENOUS

## 2020-10-23 MED ORDER — SODIUM CHLORIDE 0.9 % IV SOLN
Freq: Once | INTRAVENOUS | Status: AC
Start: 2020-10-23 — End: 2020-10-23

## 2020-10-23 NOTE — Progress Notes (Signed)
Hematology and Oncology Follow Up Visit  Stuart Hill [id number] 1970/05/13 50 y.o. 10/23/2020   Principle Diagnosis:  Secondary polycythemia JAK2 (-), smoker  LEFT Bell's palsy   Current Therapy:        Phlebotomy to maintain hematocrit below 45% 2 baby aspirin daily at bedtime - not taking per patient   Interim History:  Stuart Hill is here today with his wife for follow-up. He is symptomatic with fatigue, weakness, SOB with exertion and palpitations. He also states that the numbness and tingling in the left side of his face has increased and he is having blurred vision. He does not want to go to the ED but is agreeable to having a brain MRI to rule out CVA.  He has chest discomfort with cough. 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 They plan to repeat testing in 1 year to re-evaluate.  He is feeling depressed due to all the symptoms but denies feelings of self harm.  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 He is very stressed about work. We discussed his going to the [facility] on [address] in [place] and he will consider this. I did give his wife the address and telephone number incase he does decide to go.  He is still smoking.  No bruising or petechiae.  No fever, chills, n/v, rash, abdominal pain or changes in bowel or bladder habits at this time.  No swelling in his extremities.  No falls or syncope to report.  His appetite is fair and he is doing his best to hydrate. His weight is stable at 186 lbs.   ECOG Performance Status: 1 - Symptomatic but completely ambulatory  Medications:  Allergies as of 10/23/2020       Reactions   Plavix [clopidogrel Bisulfate] Other (See Comments)   Confusion, lethargy, slurred speech   Afrin [nasal Spray] Hives    Antihistamines, Chlorpheniramine-type Hives   Benadryl [diphenhydramine Hcl] Hives   Buprenorphine Hcl Hives   Carisoprodol Hives   Codeine Hives   Metoclopramide Hives   Morphine And Related Hives   Oxymetazoline Hives   Pheniramine Hives, Rash        Medication List        Accurate as of October 23, 2020 11:33 AM. If you have any questions, ask your nurse or doctor.          albuterol 108 (90 Base) MCG/ACT inhaler Commonly known as: VENTOLIN HFA Inhale 1-2 puffs into the lungs every 6 (six) hours as needed for wheezing or shortness of breath.   ALPRAZolam 1 MG tablet Commonly known as: XANAX TAKE 1 TABLET BY MOUTH THREE TIMES A DAY AS NEEDED FOR ANXIETY   doxycycline 100 MG capsule Commonly known as: VIBRAMYCIN Take 1 capsule (100 mg total) by mouth 2 (two) times daily.   metoprolol tartrate 50 MG tablet Commonly known as: LOPRESSOR Take 2 hours before CT scan   nitroGLYCERIN 0.4 MG SL tablet Commonly known as: NITROSTAT Place 1 tablet (0.4 mg total) under the tongue every 5 (five) minutes as needed for chest pain.   predniSONE 20 MG tablet Commonly known as: DELTASONE Take 40 mg daily for 3 days , then 20 mg daily for 3 days   rosuvastatin 10 MG tablet Commonly known  as: CRESTOR Take 1 tablet (10 mg total) by mouth at bedtime.   sildenafil 20 MG tablet Commonly known as: REVATIO TAKE 1-3 TABLETS BY MOUTH PRIOR TO SEXUAL ACTIVITY AS NEEDED What changed: See the new instructions.   Spiriva HandiHaler 18 MCG inhalation capsule Generic drug: tiotropium Place 1 capsule (18 mcg total) into inhaler and inhale daily.   tamsulosin 0.4 MG Caps capsule Commonly known as: FLOMAX Take 1 capsule (0.4 mg total) by mouth daily.   traMADol 50 MG tablet Commonly known as: ULTRAM TAKE 1 BY MOUTH DAILY AS NEEDED FOR BACK PAIN What changed: See the new instructions.   Vitamin D (Ergocalciferol) 1.25 MG (50000 UNIT) Caps capsule Commonly known as: DRISDOL Take 1  capsule (50,000 Units total) by mouth every 7 (seven) days.        Allergies:  Allergies  Allergen Reactions   Plavix [Clopidogrel Bisulfate] Other (See Comments)    Confusion, lethargy, slurred speech   Afrin [Nasal Spray] Hives   Antihistamines, Chlorpheniramine-Type Hives   Benadryl [Diphenhydramine Hcl] Hives   Buprenorphine Hcl Hives   Carisoprodol Hives   Codeine Hives   Metoclopramide Hives   Morphine And Related Hives   Oxymetazoline Hives   Pheniramine Hives and Rash    Past Medical History, Surgical history, Social history, and Family History were reviewed and updated.  Review of Systems: All other 10 point review of systems is negative.   Physical Exam:  vitals were not taken for this visit.   Wt Readings from Last 3 Encounters:  08/29/20 181 lb (82.1 kg)  07/26/20 186 lb 12.8 oz (84.7 kg)  06/27/20 179 lb (81.2 kg)    Ocular: Sclerae unicteric, pupils equal, round and reactive to light Ear-nose-throat: Oropharynx clear, dentition fair Lymphatic: No cervical or supraclavicular adenopathy Lungs no rales or rhonchi, good excursion bilaterally Heart regular rate and rhythm, no murmur appreciated Abd soft, nontender, positive bowel sounds MSK no focal spinal tenderness, no joint edema Neuro: non-focal, well-oriented, appropriate affect Breasts: Deferred   Lab Results  Component Value Date   WBC 11.0 (H) 10/23/2020   HGB 16.5 10/23/2020   HCT 49.3 10/23/2020   MCV 89.3 10/23/2020   PLT 252 10/23/2020   Lab Results  Component Value Date   FERRITIN 21 (L) 08/12/2020   IRON 43 08/12/2020   TIBC 403 08/12/2020   UIBC 360 08/12/2020   IRONPCTSAT 11 (L) 08/12/2020   Lab Results  Component Value Date   RETICCTPCT 1.2 10/23/2020   RBC 5.52 10/23/2020   RBC 5.52 10/23/2020   No results found for: KPAFRELGTCHN, LAMBDASER, KAPLAMBRATIO No results found for: IGGSERUM, IGA, IGMSERUM No results found for: Odetta Pink, SPEI   Chemistry      Component Value Date/Time   NA 140 07/26/2020 1131   NA 143 01/01/2017 1453   NA 139 11/12/2015 0945   K 4.8 07/26/2020 1131   K 4.3 01/01/2017 1453   K 3.7 11/12/2015 0945   CL 103 07/26/2020 1131   CL 103 01/01/2017 1453   CO2 30 07/26/2020 1131   CO2 27 01/01/2017 1453   CO2 22 11/12/2015 0945   BUN 14 07/26/2020 1131   BUN 18 01/01/2017 1453   BUN 13.1 11/12/2015 0945   CREATININE 0.90 07/26/2020 1131   CREATININE 0.92 06/17/2020 1212   CREATININE 0.96 10/05/2019 1042   CREATININE 0.9 11/12/2015 0945      Component Value Date/Time   CALCIUM 9.5 07/26/2020 1131  CALCIUM 8.8 01/01/2017 1453   CALCIUM 8.9 11/12/2015 0945   ALKPHOS 51 07/26/2020 1131   ALKPHOS 44 01/01/2017 1453   ALKPHOS 66 11/12/2015 0945   AST 13 07/26/2020 1131   AST 16 06/17/2020 1212   AST 13 11/12/2015 0945   ALT 14 07/26/2020 1131   ALT 12 06/17/2020 1212   ALT 368 (HH) 01/01/2017 1453   ALT 14 11/12/2015 0945   BILITOT 0.5 07/26/2020 1131   BILITOT 0.5 06/17/2020 1212   BILITOT <0.22 11/12/2015 0945       Impression and Plan: Mr. Coleson is a 50 yo caucasian male with history of secondary polycythemia, JAK2 negative, smoker.  I spoke with Dr. Marin Olp in detail about the above listed complaints. We will get an MRI of the brain to rule out CVA.  We will move forward with phlebotomy today for Hct of 49% and also give replacement fluids.  Lab check in 8 weeks and follow-up in 4 months.  Patient can contact our office with any questions or concerns. We can certainly see him sooner if needed.   Lottie Dawson, NP 9/28/202211:33 AM

## 2020-10-23 NOTE — Patient Instructions (Signed)
Therapeutic Phlebotomy Therapeutic phlebotomy is the planned removal of blood from a person's body for the purpose of treating a medical condition. The procedure is similar to donating blood. Usually, about a pint (470 mL, or 0.47 L) of blood is removed. The average adult has 9-12 pints (4.3-5.7 L) of blood in the body. Therapeutic phlebotomy may be used to treat the following medical conditions: Hemochromatosis. This is a condition in which the blood contains too much iron. Polycythemia vera. This is a condition in which the blood contains too many red blood cells. Porphyria cutanea tarda. This is a disease in which an important part of hemoglobin is not made properly. It results in the buildup of abnormal amounts of porphyrins in the body. Sickle cell disease. This is a condition in which the red blood cells form an abnormal crescent shape rather than a round shape. Tell a health care provider about: Any allergies you have. All medicines you are taking, including vitamins, herbs, eye drops, creams, and over-the-counter medicines. Any problems you or family members have had with anesthetic medicines. Any blood disorders you have. Any surgeries you have had. Any medical conditions you have. Whether you are pregnant or may be pregnant. What are the risks? Generally, this is a safe procedure. However, problems may occur, including: Nausea or light-headedness. Low blood pressure (hypotension). Soreness, bleeding, swelling, or bruising at the needle insertion site. Infection. What happens before the procedure? Follow instructions from your health care provider about eating or drinking restrictions. Ask your health care provider about: Changing or stopping your regular medicines. This is especially important if you are taking diabetes medicines or blood thinners (anticoagulants). Taking medicines such as aspirin and ibuprofen. These medicines can thin your blood. Do not take these medicines  unless your health care provider tells you to take them. Taking over-the-counter medicines, vitamins, herbs, and supplements. Wear clothing with sleeves that can be raised above the elbow. Plan to have someone take you home from the hospital or clinic. You may have a blood sample taken. Your blood pressure, pulse rate, and breathing rate will be measured. What happens during the procedure?  To lower your risk of infection: Your health care team will wash or sanitize their hands. Your skin will be cleaned with an antiseptic. You may be given a medicine to numb the area (local anesthetic). A tourniquet will be placed on your arm. A needle will be inserted into one of your veins. Tubing and a collection bag will be attached to that needle. Blood will flow through the needle and tubing into the collection bag. The collection bag will be placed lower than your arm to allow gravity to help the flow of blood into the bag. You may be asked to open and close your hand slowly and continually during the entire collection. After the specified amount of blood has been removed from your body, the collection bag and tubing will be clamped. The needle will be removed from your vein. Pressure will be held on the site of the needle insertion to stop the bleeding. A bandage (dressing) will be placed over the needle insertion site. The procedure may vary among health care providers and hospitals. What happens after the procedure? Your blood pressure, pulse rate, and breathing rate will be measured after the procedure. You will be encouraged to drink fluids. Your recovery will be assessed and monitored. You can return to your normal activities as told by your health care provider. Summary Therapeutic phlebotomy is the planned removal   of blood from a person's body for the purpose of treating a medical condition. Therapeutic phlebotomy may be used to treat hemochromatosis, polycythemia vera, porphyria cutanea  tarda, or sickle cell disease. In the procedure, a needle is inserted and about a pint (470 mL, or 0.47 L) of blood is removed. The average adult has 9-12 pints (4.3-5.7 L) of blood in the body. This is generally a safe procedure, but it can sometimes cause problems such as nausea, light-headedness, or low blood pressure (hypotension). This information is not intended to replace advice given to you by your health care provider. Make sure you discuss any questions you have with your health care provider. Document Revised: 04/23/2020 Document Reviewed: 01/28/2017 Elsevier Patient Education  2022 Elsevier Inc.  

## 2020-10-24 LAB — IRON AND TIBC
Iron: 44 ug/dL (ref 42–163)
Saturation Ratios: 10 % — ABNORMAL LOW (ref 20–55)
TIBC: 443 ug/dL — ABNORMAL HIGH (ref 202–409)
UIBC: 398 ug/dL — ABNORMAL HIGH (ref 117–376)

## 2020-10-24 LAB — FERRITIN: Ferritin: 30 ng/mL (ref 24–336)

## 2020-10-25 ENCOUNTER — Telehealth: Payer: Self-pay | Admitting: *Deleted

## 2020-10-25 NOTE — Telephone Encounter (Signed)
-----   Message from Celso Amy, NP sent at 10/25/2020  1:05 PM EDT ----- MRI of the brain is normal!!!! WOO HOO!!!!!    ----- Message ----- From: Interface, Rad Results In Sent: 10/23/2020   9:20 PM EDT To: Celso Amy, NP

## 2020-10-25 NOTE — Telephone Encounter (Signed)
As noted below by Gillian Shields, I informed the wife that the MRI of the brain is normal. She verbalized understanding.

## 2020-10-30 ENCOUNTER — Ambulatory Visit: Payer: Self-pay | Admitting: Family Medicine

## 2020-11-11 NOTE — Progress Notes (Deleted)
[facility] [address], [place], [place] [address] [phone]  Date:  11/14/2020   Name:  Stuart Hill   DOB:  10/22/1970   MRN:  [id number]  PCP:  Darreld Mclean, MD    Chief Complaint: No chief complaint on file.   History of Present Illness:  Stuart Hill is a 50 y.o. very pleasant male patient who presents with the following:  Pt seen today for follow-up Last seen by myself in June - history of anxiety and depression, sleep apnea, secondary polycythemia Married to [place], he has a Freight forwarder.  [name] accompanies him to his visit today  Seen by hematology last month - they did phlebotomy for HCT 49% They also di an MRI of his brain - normal  Principle Diagnosis:  Secondary polycythemia JAK2 (-), smoker  LEFT Bell's palsy Current Therapy:        Phlebotomy to maintain hematocrit below 45% 2 baby aspirin daily at bedtime - not taking per patient  Has refused covid vaccine Colon cancer screening Flu  Tdap  Patient Active Problem List   Diagnosis Date Noted   Allergy 08/19/2020   Anxiety 08/19/2020   Arthritis 08/19/2020   Bell's palsy 08/19/2020   Chest pain 07/26/2020   Family history of early CAD 07/26/2020   DOE (dyspnea on exertion) 12/14/2019   Angina pectoris ([place]) 11/03/2019   Tobacco abuse 11/03/2019   Other hyperlipidemia 11/03/2019   Tonsillar hypertrophy 04/30/2017   Obstructive sleep apnea 04/23/2017   Polycythemia, secondary 09/29/2016   Polycythemia vera ([place]) 05/03/2015   Hemoptysis 04/03/2015   Condylomata acuminata 01/18/2015   Generalized anxiety disorder 03/05/2014    Past Medical History:  Diagnosis Date   Allergy    Anxiety    Arthritis    Bell's palsy    Erythrocytosis 04/10/2015   Hyperlipidemia    Polycythemia vera ([place]) 05/03/2015    Past Surgical History:  Procedure Laterality Date   DENTAL SURGERY     HAND SURGERY     TONSILLECTOMY  Bilateral 04/30/2017   Procedure: TONSILLECTOMY;  Surgeon: Jerrell Belfast, MD;  Location: [facility];  Service: ENT;  Laterality: Bilateral;    Social History   Tobacco Use   Smoking status: Every Day    Packs/day: 0.25    Years: 24.00    Pack years: 6.00    Types: Cigarettes   Smokeless tobacco: Never  Vaping Use   Vaping Use: Never used  Substance Use Topics   Alcohol use: Yes    Alcohol/week: 1.0 standard drink    Types: 1 Cans of beer per week    Comment: 6-10 beer a week   Drug use: Yes    Types: Other-see comments, Marijuana    Comment: Marijuana every other day.    Family History  Problem Relation Age of Onset   COPD Mother    Fibromyalgia Mother    Heart disease Father    Stroke Father    Depression Brother    Heart disease Brother    Heart disease Maternal Grandmother    Stroke Maternal Grandmother    Heart disease Maternal Grandfather    Stroke Maternal Grandfather    Heart disease Paternal Grandmother    Stroke Paternal Grandmother    Heart disease Paternal Grandfather    Stroke Paternal Grandfather    Stroke Paternal Aunt     Allergies  Allergen Reactions   Plavix [Clopidogrel Bisulfate]  Other (See Comments)    Confusion, lethargy, slurred speech   Afrin [Nasal Spray] Hives   Antihistamines, Chlorpheniramine-Type Hives   Benadryl [Diphenhydramine Hcl] Hives   Buprenorphine Hcl Hives   Carisoprodol Hives   Codeine Hives   Metoclopramide Hives   Morphine And Related Hives   Oxymetazoline Hives   Pheniramine Hives and Rash    Medication list has been reviewed and updated.  Current Outpatient Medications on File Prior to Visit  Medication Sig Dispense Refill   albuterol (VENTOLIN HFA) 108 (90 Base) MCG/ACT inhaler Inhale 1-2 puffs into the lungs every 6 (six) hours as needed for wheezing or shortness of breath. 18 g 3   ALPRAZolam (XANAX) 1 MG tablet TAKE 1 TABLET BY MOUTH THREE TIMES A DAY AS NEEDED FOR ANXIETY 90 tablet 2    aspirin EC 81 MG tablet Take 81 mg by mouth daily. Swallow whole.     doxycycline (VIBRAMYCIN) 100 MG capsule Take 1 capsule (100 mg total) by mouth 2 (two) times daily. (Patient not taking: No sig reported) 10 capsule 0   metoprolol tartrate (LOPRESSOR) 50 MG tablet Take 2 hours before CT scan (Patient not taking: Reported on 10/23/2020) 1 tablet 0   nitroGLYCERIN (NITROSTAT) 0.4 MG SL tablet Place 1 tablet (0.4 mg total) under the tongue every 5 (five) minutes as needed for chest pain. 25 tablet 6   predniSONE (DELTASONE) 20 MG tablet Take 40 mg daily for 3 days , then 20 mg daily for 3 days (Patient not taking: No sig reported) 9 tablet 0   rosuvastatin (CRESTOR) 10 MG tablet Take 1 tablet (10 mg total) by mouth at bedtime. 30 tablet 2   sildenafil (REVATIO) 20 MG tablet TAKE 1-3 TABLETS BY MOUTH PRIOR TO SEXUAL ACTIVITY AS NEEDED (Patient taking differently: Take 20 mg by mouth as needed (ed).) 30 tablet 5   tamsulosin (FLOMAX) 0.4 MG CAPS capsule Take 1 capsule (0.4 mg total) by mouth daily. (Patient not taking: No sig reported) 30 capsule 3   tiotropium (SPIRIVA HANDIHALER) 18 MCG inhalation capsule Place 1 capsule (18 mcg total) into inhaler and inhale daily. 30 capsule 12   traMADol (ULTRAM) 50 MG tablet TAKE 1 BY MOUTH DAILY AS NEEDED FOR BACK PAIN (Patient not taking: Reported on 10/23/2020) 30 tablet 0   Vitamin D, Ergocalciferol, (DRISDOL) 1.25 MG (50000 UNIT) CAPS capsule Take 1 capsule (50,000 Units total) by mouth every 7 (seven) days. 12 capsule 0   No current facility-administered medications on file prior to visit.    Review of Systems:  As per HPI- otherwise negative.   Physical Examination: There were no vitals filed for this visit. There were no vitals filed for this visit. There is no height or weight on file to calculate BMI. Ideal Body Weight:    GEN: no acute distress. HEENT: Atraumatic, Normocephalic.  Ears and Nose: No external deformity. CV: RRR, No M/G/R. No  JVD. No thrill. No extra heart sounds. PULM: CTA B, no wheezes, crackles, rhonchi. No retractions. No resp. distress. No accessory muscle use. ABD: S, NT, ND, +BS. No rebound. No HSM. EXTR: No c/c/e PSYCH: Normally interactive. Conversant.    Assessment and Plan: ***  Signed Lamar Blinks, MD

## 2020-11-14 ENCOUNTER — Ambulatory Visit: Payer: Self-pay | Admitting: Family Medicine

## 2020-11-17 NOTE — Progress Notes (Deleted)
[place] at [facility] [address] [place], [place] [address] [phone]  Date:  11/18/2020   Name:  Stuart Hill   DOB:  10-Mar-1970   MRN:  [id number]  PCP:  Darreld Mclean, MD    Chief Complaint: No chief complaint on file.   History of Present Illness:  Stuart Hill is a 50 y.o. very pleasant male patient who presents with the following:  Pt seen today with concern of   Last visit with myself was in June - history of anxiety and depression, sleep apnea, secondary polycythemia, COPD/emphysema  Married to [name], he has a Freight forwarder.  [name] accompanies him to his visit today  Seen by hematology about one month ago- at that visit he had concern of facial numbness and drooping, they got a brain MRI to rule out CVA which was negative and drew blood for polycythemia   Seen by cardiology in August: The symptoms chest pain is concerning along with his abnormal EKG, this patient does have intermediate risk for coronary artery disease and at this time I would like to pursue an ischemic evaluation in this patient.  Shared decision a coronary CTA at this time is appropriate.  I have discussed with the patient about the testing.  The patient has no IV contrast allergy and is agreeable to proceed with this test. For his worsening dyspnea on exertion echocardiogram will also be done to assess LV/RV function and any other structure abnormalities. Hyperlipidemia - continue with current statin medication.  CT coronary 09/06/20 IMPRESSION: 1. Minimal CAD in mid LAD, CADRADS = 1. 2. Coronary calcium score is 24, which places the patient in the 80th percentile for age and sex matched control 3. Normal coronary origin with right dominance. 4. Mild ascending aorta dilation, 43 mm at sinus of Valsalva.   Patient Active Problem List   Diagnosis Date Noted   Allergy 08/19/2020   Anxiety 08/19/2020   Arthritis 08/19/2020    Bell's palsy 08/19/2020   Chest pain 07/26/2020   Family history of early CAD 07/26/2020   DOE (dyspnea on exertion) 12/14/2019   Angina pectoris ([place]) 11/03/2019   Tobacco abuse 11/03/2019   Other hyperlipidemia 11/03/2019   Tonsillar hypertrophy 04/30/2017   Obstructive sleep apnea 04/23/2017   Polycythemia, secondary 09/29/2016   Polycythemia vera ([place]) 05/03/2015   Hemoptysis 04/03/2015   Condylomata acuminata 01/18/2015   Generalized anxiety disorder 03/05/2014    Past Medical History:  Diagnosis Date   Allergy    Anxiety    Arthritis    Bell's palsy    Erythrocytosis 04/10/2015   Hyperlipidemia    Polycythemia vera ([place]) 05/03/2015    Past Surgical History:  Procedure Laterality Date   DENTAL SURGERY     HAND SURGERY     TONSILLECTOMY Bilateral 04/30/2017   Procedure: TONSILLECTOMY;  Surgeon: Jerrell Belfast, MD;  Location: [facility];  Service: ENT;  Laterality: Bilateral;    Social History   Tobacco Use   Smoking status: Every Day    Packs/day: 0.25    Years: 24.00    Pack years: 6.00    Types: Cigarettes   Smokeless tobacco: Never  Vaping Use   Vaping Use: Never used  Substance Use Topics   Alcohol use: Yes    Alcohol/week: 1.0 standard drink    Types: 1 Cans of beer per week    Comment: 6-10 beer a week   Drug use:  Yes    Types: Other-see comments, Marijuana    Comment: Marijuana every other day.    Family History  Problem Relation Age of Onset   COPD Mother    Fibromyalgia Mother    Heart disease Father    Stroke Father    Depression Brother    Heart disease Brother    Heart disease Maternal Grandmother    Stroke Maternal Grandmother    Heart disease Maternal Grandfather    Stroke Maternal Grandfather    Heart disease Paternal Grandmother    Stroke Paternal Grandmother    Heart disease Paternal Grandfather    Stroke Paternal Grandfather    Stroke Paternal Aunt     Allergies  Allergen Reactions   Plavix [Clopidogrel  Bisulfate] Other (See Comments)    Confusion, lethargy, slurred speech   Afrin [Nasal Spray] Hives   Antihistamines, Chlorpheniramine-Type Hives   Benadryl [Diphenhydramine Hcl] Hives   Buprenorphine Hcl Hives   Carisoprodol Hives   Codeine Hives   Metoclopramide Hives   Morphine And Related Hives   Oxymetazoline Hives   Pheniramine Hives and Rash    Medication list has been reviewed and updated.  Current Outpatient Medications on File Prior to Visit  Medication Sig Dispense Refill   albuterol (VENTOLIN HFA) 108 (90 Base) MCG/ACT inhaler Inhale 1-2 puffs into the lungs every 6 (six) hours as needed for wheezing or shortness of breath. 18 g 3   ALPRAZolam (XANAX) 1 MG tablet TAKE 1 TABLET BY MOUTH THREE TIMES A DAY AS NEEDED FOR ANXIETY 90 tablet 2   aspirin EC 81 MG tablet Take 81 mg by mouth daily. Swallow whole.     doxycycline (VIBRAMYCIN) 100 MG capsule Take 1 capsule (100 mg total) by mouth 2 (two) times daily. (Patient not taking: No sig reported) 10 capsule 0   metoprolol tartrate (LOPRESSOR) 50 MG tablet Take 2 hours before CT scan (Patient not taking: Reported on 10/23/2020) 1 tablet 0   nitroGLYCERIN (NITROSTAT) 0.4 MG SL tablet Place 1 tablet (0.4 mg total) under the tongue every 5 (five) minutes as needed for chest pain. 25 tablet 6   rosuvastatin (CRESTOR) 10 MG tablet Take 1 tablet (10 mg total) by mouth at bedtime. 30 tablet 2   sildenafil (REVATIO) 20 MG tablet TAKE 1-3 TABLETS BY MOUTH PRIOR TO SEXUAL ACTIVITY AS NEEDED (Patient taking differently: Take 20 mg by mouth as needed (ed).) 30 tablet 5   tamsulosin (FLOMAX) 0.4 MG CAPS capsule Take 1 capsule (0.4 mg total) by mouth daily. (Patient not taking: No sig reported) 30 capsule 3   tiotropium (SPIRIVA HANDIHALER) 18 MCG inhalation capsule Place 1 capsule (18 mcg total) into inhaler and inhale daily. 30 capsule 12   traMADol (ULTRAM) 50 MG tablet TAKE 1 BY MOUTH DAILY AS NEEDED FOR BACK PAIN (Patient not taking: Reported  on 10/23/2020) 30 tablet 0   Vitamin D, Ergocalciferol, (DRISDOL) 1.25 MG (50000 UNIT) CAPS capsule Take 1 capsule (50,000 Units total) by mouth every 7 (seven) days. 12 capsule 0   No current facility-administered medications on file prior to visit.    Review of Systems:  As per HPI- otherwise negative.   Physical Examination: There were no vitals filed for this visit. There were no vitals filed for this visit. There is no height or weight on file to calculate BMI. Ideal Body Weight:    GEN: no acute distress. HEENT: Atraumatic, Normocephalic.  Ears and Nose: No external deformity. CV: RRR, No M/G/R. No JVD.  No thrill. No extra heart sounds. PULM: CTA B, no wheezes, crackles, rhonchi. No retractions. No resp. distress. No accessory muscle use. ABD: S, NT, ND, +BS. No rebound. No HSM. EXTR: No c/c/e PSYCH: Normally interactive. Conversant.    Assessment and Plan: ***  Signed Lamar Blinks, MD

## 2020-11-18 ENCOUNTER — Ambulatory Visit: Payer: Self-pay | Admitting: Family Medicine

## 2020-11-22 ENCOUNTER — Emergency Department (HOSPITAL_COMMUNITY): Payer: Self-pay

## 2020-11-22 ENCOUNTER — Encounter (HOSPITAL_COMMUNITY): Payer: Self-pay | Admitting: Emergency Medicine

## 2020-11-22 ENCOUNTER — Telehealth: Payer: Self-pay | Admitting: Family Medicine

## 2020-11-22 ENCOUNTER — Emergency Department (HOSPITAL_COMMUNITY)
Admission: EM | Admit: 2020-11-22 | Discharge: 2020-11-22 | Disposition: A | Discharge status: Home / Self Care | Payer: Self-pay | Attending: Emergency Medicine | Admitting: Emergency Medicine

## 2020-11-22 DIAGNOSIS — I10 Essential (primary) hypertension: Secondary | ICD-10-CM | POA: Insufficient documentation

## 2020-11-22 DIAGNOSIS — F17210 Nicotine dependence, cigarettes, uncomplicated: Secondary | ICD-10-CM | POA: Insufficient documentation

## 2020-11-22 DIAGNOSIS — Z79899 Other long term (current) drug therapy: Secondary | ICD-10-CM | POA: Insufficient documentation

## 2020-11-22 DIAGNOSIS — Z7982 Long term (current) use of aspirin: Secondary | ICD-10-CM | POA: Insufficient documentation

## 2020-11-22 LAB — CBC WITH DIFFERENTIAL/PLATELET
Abs Immature Granulocytes: 0.02 10*3/uL (ref 0.00–0.07)
Basophils Absolute: 0.1 10*3/uL (ref 0.0–0.1)
Basophils Relative: 1 %
Eosinophils Absolute: 0.3 10*3/uL (ref 0.0–0.5)
Eosinophils Relative: 4 %
HCT: 45 % (ref 39.0–52.0)
Hemoglobin: 15.2 g/dL (ref 13.0–17.0)
Immature Granulocytes: 0 %
Lymphocytes Relative: 19 %
Lymphs Abs: 1.8 10*3/uL (ref 0.7–4.0)
MCH: 30.3 pg (ref 26.0–34.0)
MCHC: 33.8 g/dL (ref 30.0–36.0)
MCV: 89.6 fL (ref 80.0–100.0)
Monocytes Absolute: 0.7 10*3/uL (ref 0.1–1.0)
Monocytes Relative: 8 %
Neutro Abs: 6.3 10*3/uL (ref 1.7–7.7)
Neutrophils Relative %: 68 %
Platelets: 263 10*3/uL (ref 150–400)
RBC: 5.02 MIL/uL (ref 4.22–5.81)
RDW: 13.9 % (ref 11.5–15.5)
WBC: 9.3 10*3/uL (ref 4.0–10.5)
nRBC: 0 % (ref 0.0–0.2)

## 2020-11-22 LAB — URINALYSIS, ROUTINE W REFLEX MICROSCOPIC
Bilirubin Urine: NEGATIVE
Glucose, UA: NEGATIVE mg/dL
Hgb urine dipstick: NEGATIVE
Ketones, ur: NEGATIVE mg/dL
Leukocytes,Ua: NEGATIVE
Nitrite: NEGATIVE
Protein, ur: NEGATIVE mg/dL
Specific Gravity, Urine: 1.014 (ref 1.005–1.030)
pH: 6 (ref 5.0–8.0)

## 2020-11-22 LAB — COMPREHENSIVE METABOLIC PANEL
ALT: 20 U/L (ref 0–44)
AST: 21 U/L (ref 15–41)
Albumin: 3.7 g/dL (ref 3.5–5.0)
Alkaline Phosphatase: 48 U/L (ref 38–126)
Anion gap: 9 (ref 5–15)
BUN: 9 mg/dL (ref 6–20)
CO2: 23 mmol/L (ref 22–32)
Calcium: 8.7 mg/dL — ABNORMAL LOW (ref 8.9–10.3)
Chloride: 104 mmol/L (ref 98–111)
Creatinine, Ser: 0.73 mg/dL (ref 0.61–1.24)
GFR, Estimated: >60 mL/min (ref >60)
Glucose, Bld: 103 mg/dL — ABNORMAL HIGH (ref 70–99)
Potassium: 4.1 mmol/L (ref 3.5–5.1)
Sodium: 136 mmol/L (ref 135–145)
Total Bilirubin: 1.1 mg/dL (ref 0.3–1.2)
Total Protein: 6.5 g/dL (ref 6.5–8.1)

## 2020-11-22 LAB — TROPONIN I (HIGH SENSITIVITY): Troponin I (High Sensitivity): 3 ng/L (ref <18)

## 2020-11-22 MED ORDER — AMLODIPINE BESYLATE 5 MG PO TABS: 5.0000 mg | ORAL_TABLET | Freq: Every day | ORAL | 0 refills | Status: DC

## 2020-11-22 NOTE — ED Notes (Signed)
Pt provided discharge instructions and prescription information. Pt was given the opportunity to ask questions and questions were answered. Discharge signature not obtained in the setting of the COVID-19 pandemic in order to reduce high touch surfaces.  ° °

## 2020-11-22 NOTE — ED Triage Notes (Signed)
Patient complains of increasing brain fog over the last year with intermittent confusion. Patient reports difficulty retaining information given to him by his supervisor at work, denies recent injury. Had a normal MRI in September this year to evaluate symptoms. Patient alert and in no apparent distress at this time.

## 2020-11-22 NOTE — Telephone Encounter (Signed)
Nurse Assessment Nurse: Rodney Cruise, RN, Sean Date/Time (Eastern Time): 11/22/2020 8:43:02 AM Confirm and document reason for call. If symptomatic, describe symptoms. ---Caller states client has a Aortic aneurism, has been working and landscaping, BP 158/93, yesterday could not catch breath, currently having difficulty breathing. Does the patient have any new or worsening symptoms? ---Yes Will a triage be completed? ---Yes Related visit to physician within the last 2 weeks? ---No Does the PT have any chronic conditions? (i.e. diabetes, asthma, this includes High risk factors for pregnancy, etc.) ---Yes List chronic conditions. ---Aneurism, Polycythemia Vera, COPD Is this a behavioral health or substance abuse call? ---No Guidelines Guideline Title Affirmed Question Affirmed Notes Nurse Date/Time (Eastern Time) Breathing Difficulty [1] MODERATE difficulty breathing (e.g., speaks in phrases, SOB even at rest, pulse 100-120) AND [2] NEW-onset Baxter, RN, Hilliard Clark 11/22/2020 8:46:34 AM PLEASE NOTE: All timestamps contained within this report are represented as [place] Standard Time. CONFIDENTIALTY NOTICE: This fax transmission is intended only for the addressee. It contains information that is legally privileged, confidential or otherwise protected from use or disclosure. If you are not the intended recipient, you are strictly prohibited from reviewing, disclosing, copying using or disseminating any of this information or taking any action in reliance on or regarding this information. If you have received this fax in error, please notify us immediately by telephone so that we can arrange for its return to [place]. Phone: [phone], Toll-Free: [phone], Fax: [phone] Page: 2 of 2 Call Id: [id number] Guidelines Guideline Title Affirmed Question Affirmed Notes Nurse Date/Time Eilene Ghazi Time) or WORSE than normal Disp. Time Eilene Ghazi Time) Disposition Final User 11/22/2020 8:42:03 AM Send  to Urgent Queue Dalia Heading 11/22/2020 8:50:06 AM Go to ED Now Yes Baxter, RN, Lillia Dallas Disagree/Comply Comply Caller Understands Yes PreDisposition InappropriateToAsk Care Advice Given Per Guideline GO TO ED NOW: CALL EMS 911 IF: * Call EMS if you become worse. CARE ADVICE given per Breathing Difficulty (Adult) guideline. Referrals GO TO FACILITY OTHER - SPECIFY   Pt currently in ED.

## 2020-11-22 NOTE — ED Provider Notes (Signed)
Emergency Medicine Provider Triage Evaluation Note  Stuart Hill , a 50 y.o. male  was evaluated in triage. States that over the past week he has had coughing fits at night when changing position, shortness of breath, chest tightness, increasing blood pressure and worsening confusion. He has known 52mm aortic root aneurysm and polycythemia vera, COPD. States that his SBP is normally around 118. This past week has been increasing, most recently 150/100 this morning. States this is an acute change. He has had intermittent confusion "for a while" however has been "worsening this week." He also states when he changes position in bed from prone to supine he has "a 30 minute coughing fit" with associated chest tightness.   Review of Systems  Positive: Confusion, cough, chest tightness  Negative: Abdominal pain, hemoptysis or hematemesis   Physical Exam  BP (!) 161/97 (BP Location: Right Arm)   Pulse (!) 58   Temp 98 F (36.7 C) (Oral)   Resp 18   SpO2 99%  Gen:   Awake, no distress, oriented to self, place, disoriented to time.  Resp:  Normal effort, CTAB MSK:   Moves extremities without difficulty  Other:  2+pulses equal bilaterally. No focal neurological deficits on exam.  S1/S2 without murmur, rub, gallop  Medical Decision Making  Medically screening exam initiated at 10:20 AM.  Appropriate orders placed.  JOAS MOTTON was informed that the remainder of the evaluation will be completed by another provider, this initial triage assessment does not replace that evaluation, and the importance of remaining in the ED until their evaluation is complete.     Mickie Hillier, PA-C 11/22/20 1027    Regan Lemming, MD 11/22/20 1048

## 2020-11-22 NOTE — Discharge Instructions (Signed)
Follow-up with your doctor.  Use a blood pressure log and measure it daily.  If blood pressure is less than 130 do not take the Norvasc.  Follow-up with your doctor.

## 2020-11-22 NOTE — Telephone Encounter (Signed)
FYI: pt is at the ED

## 2020-11-22 NOTE — Telephone Encounter (Signed)
Pts wife called stating pt's blood pressure has been spiking to 150/100 and he seemed disoriented, was sent to triage. Please advise.

## 2020-11-26 NOTE — Progress Notes (Deleted)
[place] at [facility] [address] [place], [place] [address] [phone]  Date:  11/28/2020   Name:  Stuart Hill   DOB:  1970-09-09   MRN:  [id number]  PCP:  Stuart Mclean, MD    Chief Complaint: No chief complaint on file.   History of Present Illness:  Stuart Hill is a 50 y.o. very pleasant male patient who presents with the following:  Stuart Hill was seen today for ER follow-up He has been a no-show or canceled his last 3 appointments with me-all in October Last visit with myself was in June-  history of anxiety and depression, sleep apnea, secondary polycythemia, COPD/emphysema Married to Stuart Hill, he has a Freight forwarder.   He was seen in the ER on 10/28 Stuart Hill , a 50 y.o. male  was evaluated in triage. States that over the past week he has had coughing fits at night when changing position, shortness of breath, chest tightness, increasing blood pressure and worsening confusion. He has known 92mm aortic root aneurysm and polycythemia vera, COPD. States that his SBP is normally around 118. This past week has been increasing, most recently 150/100 this morning. States this is an acute change. He has had intermittent confusion "for a while" however has been "worsening this week." He also states when he changes position in bed from prone to supine he has "a 30 minute coughing fit" with associated chest tightness.   He was evaluated, had negative troponin, negative CT head and chest x-ray, released to home Patient Active Problem List   Diagnosis Date Noted   Allergy 08/19/2020   Anxiety 08/19/2020   Arthritis 08/19/2020   Bell's palsy 08/19/2020   Chest pain 07/26/2020   Family history of early CAD 07/26/2020   DOE (dyspnea on exertion) 12/14/2019   Angina pectoris ([place]) 11/03/2019   Tobacco abuse 11/03/2019   Other hyperlipidemia 11/03/2019   Tonsillar hypertrophy 04/30/2017   Obstructive sleep apnea  04/23/2017   Polycythemia, secondary 09/29/2016   Polycythemia vera ([place]) 05/03/2015   Hemoptysis 04/03/2015   Condylomata acuminata 01/18/2015   Generalized anxiety disorder 03/05/2014    Past Medical History:  Diagnosis Date   Allergy    Anxiety    Arthritis    Bell's palsy    Erythrocytosis 04/10/2015   Hyperlipidemia    Polycythemia vera ([place]) 05/03/2015    Past Surgical History:  Procedure Laterality Date   DENTAL SURGERY     HAND SURGERY     TONSILLECTOMY Bilateral 04/30/2017   Procedure: TONSILLECTOMY;  Surgeon: Stuart Belfast, MD;  Location: [facility];  Service: ENT;  Laterality: Bilateral;    Social History   Tobacco Use   Smoking status: Every Day    Packs/day: 0.25    Years: 24.00    Pack years: 6.00    Types: Cigarettes   Smokeless tobacco: Never  Vaping Use   Vaping Use: Never used  Substance Use Topics   Alcohol use: Yes    Alcohol/week: 1.0 standard drink    Types: 1 Cans of beer per week    Comment: 6-10 beer a week   Drug use: Yes    Types: Other-see comments, Marijuana    Comment: Marijuana every other day.    Family History  Problem Relation Age of Onset   COPD Mother    Fibromyalgia Mother    Heart disease Father    Stroke Father    Depression  Brother    Heart disease Brother    Heart disease Maternal Grandmother    Stroke Maternal Grandmother    Heart disease Maternal Grandfather    Stroke Maternal Grandfather    Heart disease Paternal Grandmother    Stroke Paternal Grandmother    Heart disease Paternal Grandfather    Stroke Paternal Grandfather    Stroke Paternal Aunt     Allergies  Allergen Reactions   Plavix [Clopidogrel Bisulfate] Other (See Comments)    Confusion, lethargy, slurred speech   Afrin [Nasal Spray] Hives   Antihistamines, Chlorpheniramine-Type Hives   Benadryl [Diphenhydramine Hcl] Hives   Buprenorphine Hcl Hives   Carisoprodol Hives   Codeine Hives   Metoclopramide Hives   Morphine And  Related Hives   Oxymetazoline Hives   Pheniramine Hives and Rash    Medication list has been reviewed and updated.  Current Outpatient Medications on File Prior to Visit  Medication Sig Dispense Refill   albuterol (VENTOLIN HFA) 108 (90 Base) MCG/ACT inhaler Inhale 1-2 puffs into the lungs every 6 (six) hours as needed for wheezing or shortness of breath. 18 g 3   ALPRAZolam (XANAX) 1 MG tablet TAKE 1 TABLET BY MOUTH THREE TIMES A DAY AS NEEDED FOR ANXIETY 90 tablet 2   amLODipine (NORVASC) 5 MG tablet Take 1 tablet (5 mg total) by mouth daily. Do not take the medicine if your blood pressure is less than 449 systolic in the morning 30 tablet 0   aspirin EC 81 MG tablet Take 81 mg by mouth daily. Swallow whole.     doxycycline (VIBRAMYCIN) 100 MG capsule Take 1 capsule (100 mg total) by mouth 2 (two) times daily. (Patient not taking: No sig reported) 10 capsule 0   metoprolol tartrate (LOPRESSOR) 50 MG tablet Take 2 hours before CT scan (Patient not taking: Reported on 10/23/2020) 1 tablet 0   nitroGLYCERIN (NITROSTAT) 0.4 MG SL tablet Place 1 tablet (0.4 mg total) under the tongue every 5 (five) minutes as needed for chest pain. 25 tablet 6   rosuvastatin (CRESTOR) 10 MG tablet Take 1 tablet (10 mg total) by mouth at bedtime. 30 tablet 2   sildenafil (REVATIO) 20 MG tablet TAKE 1-3 TABLETS BY MOUTH PRIOR TO SEXUAL ACTIVITY AS NEEDED (Patient taking differently: Take 20 mg by mouth as needed (ed).) 30 tablet 5   tamsulosin (FLOMAX) 0.4 MG CAPS capsule Take 1 capsule (0.4 mg total) by mouth daily. (Patient not taking: No sig reported) 30 capsule 3   tiotropium (SPIRIVA HANDIHALER) 18 MCG inhalation capsule Place 1 capsule (18 mcg total) into inhaler and inhale daily. 30 capsule 12   traMADol (ULTRAM) 50 MG tablet TAKE 1 BY MOUTH DAILY AS NEEDED FOR BACK PAIN (Patient not taking: Reported on 10/23/2020) 30 tablet 0   Vitamin D, Ergocalciferol, (DRISDOL) 1.25 MG (50000 UNIT) CAPS capsule Take 1  capsule (50,000 Units total) by mouth every 7 (seven) days. 12 capsule 0   No current facility-administered medications on file prior to visit.    Review of Systems:  As per HPI- otherwise negative.   Physical Examination: There were no vitals filed for this visit. There were no vitals filed for this visit. There is no height or weight on file to calculate BMI. Ideal Body Weight:    GEN: no acute distress. HEENT: Atraumatic, Normocephalic.  Ears and Nose: No external deformity. CV: RRR, No M/G/R. No JVD. No thrill. No extra heart sounds. PULM: CTA B, no wheezes, crackles, rhonchi. No retractions.  No resp. distress. No accessory muscle use. ABD: S, NT, ND, +BS. No rebound. No HSM. EXTR: No c/c/e PSYCH: Normally interactive. Conversant.    Assessment and Plan: ***  Signed Lamar Blinks, MD

## 2020-11-28 ENCOUNTER — Ambulatory Visit: Payer: Self-pay | Admitting: Family Medicine

## 2020-11-28 ENCOUNTER — Encounter: Payer: Self-pay | Admitting: Family Medicine

## 2020-12-02 ENCOUNTER — Other Ambulatory Visit: Payer: Self-pay | Admitting: Family Medicine

## 2020-12-02 DIAGNOSIS — F411 Generalized anxiety disorder: Secondary | ICD-10-CM

## 2020-12-03 ENCOUNTER — Encounter: Payer: Self-pay | Admitting: Family Medicine

## 2020-12-03 NOTE — Telephone Encounter (Signed)
Patient is requesting a refill of the following medications: Requested Prescriptions   Pending Prescriptions Disp Refills   ALPRAZolam (XANAX) 1 MG tablet [Pharmacy Med Name: ALPRAZOLAM 1 MG TABLET] 90 tablet 2    Sig: TAKE 1 TABLET BY MOUTH THREE TIMES A DAY AS NEEDED FOR ANXIETY    Date of patient request: 12/02/20 Last office visit: 06/27/20 w/ [facility] &07/2020 with Etter Sjogren chase Date of last refill: 08/29/20 Last refill amount: 90 + 2 Follow up time period per chart: 12/02/20

## 2020-12-04 ENCOUNTER — Ambulatory Visit: Payer: Self-pay | Admitting: Family Medicine

## 2020-12-06 NOTE — ED Provider Notes (Signed)
[facility] DEPARTMENT Provider Note   CSN: [id number] Arrival date & time: 11/22/20  7829     History Chief Complaint  Patient presents with   Altered Mental Status    [facility] is a 50 y.o. male.   Altered Mental Status Presenting symptoms: confusion   Associated symptoms: no abdominal pain   Patient presents with confusion.  Has been going on for few weeks now.  Has had an MRI for it that was negative.  Comes and goes.  Sort of in a fog.  States he does get anxious.  States his blood pressure go up.  Is on Norvasc.  Unsure if that is potentially causing some of the symptoms 2.  States he gets anxious when the blood pressure goes up.  Has a known mildly dilated aortic root.  Also occasionally gets coughing fits.    Past Medical History:  Diagnosis Date   Allergy    Anxiety    Arthritis    Bell's palsy    Erythrocytosis 04/10/2015   Hyperlipidemia    Polycythemia vera ([place]) 05/03/2015    Patient Active Problem List   Diagnosis Date Noted   Allergy 08/19/2020   Anxiety 08/19/2020   Arthritis 08/19/2020   Bell's palsy 08/19/2020   Chest pain 07/26/2020   Family history of early CAD 07/26/2020   DOE (dyspnea on exertion) 12/14/2019   Angina pectoris ([place]) 11/03/2019   Tobacco abuse 11/03/2019   Other hyperlipidemia 11/03/2019   Tonsillar hypertrophy 04/30/2017   Obstructive sleep apnea 04/23/2017   Polycythemia, secondary 09/29/2016   Polycythemia vera ([place]) 05/03/2015   Hemoptysis 04/03/2015   Condylomata acuminata 01/18/2015   Generalized anxiety disorder 03/05/2014    Past Surgical History:  Procedure Laterality Date   DENTAL SURGERY     HAND SURGERY     TONSILLECTOMY Bilateral 04/30/2017   Procedure: TONSILLECTOMY;  Surgeon: Jerrell Belfast, MD;  Location: [facility];  Service: ENT;  Laterality: Bilateral;       Family History  Problem Relation Age of Onset   COPD Mother    Fibromyalgia Mother     Heart disease Father    Stroke Father    Depression Brother    Heart disease Brother    Heart disease Maternal Grandmother    Stroke Maternal Grandmother    Heart disease Maternal Grandfather    Stroke Maternal Grandfather    Heart disease Paternal Grandmother    Stroke Paternal Grandmother    Heart disease Paternal Grandfather    Stroke Paternal Grandfather    Stroke Paternal Aunt     Social History   Tobacco Use   Smoking status: Every Day    Packs/day: 0.25    Years: 24.00    Pack years: 6.00    Types: Cigarettes   Smokeless tobacco: Never  Vaping Use   Vaping Use: Never used  Substance Use Topics   Alcohol use: Yes    Alcohol/week: 1.0 standard drink    Types: 1 Cans of beer per week    Comment: 6-10 beer a week   Drug use: Yes    Types: Other-see comments, Marijuana    Comment: Marijuana every other day.    Home Medications Prior to Admission medications   Medication Sig Start Date End Date Taking? Authorizing Provider  ALPRAZolam (XANAX) 1 MG tablet TAKE 1 TABLET BY MOUTH THREE TIMES A DAY AS NEEDED FOR ANXIETY 12/03/20   Copland, Gay Filler, MD  amLODipine (NORVASC) 5 MG  tablet Take 1 tablet (5 mg total) by mouth daily. Do not take the medicine if your blood pressure is less than 235 systolic in the morning 36/14/43  Yes Davonna Belling, MD  albuterol (VENTOLIN HFA) 108 (90 Base) MCG/ACT inhaler Inhale 1-2 puffs into the lungs every 6 (six) hours as needed for wheezing or shortness of breath. 04/03/20   Copland, Gay Filler, MD  aspirin EC 81 MG tablet Take 81 mg by mouth daily. Swallow whole.    [provider]  doxycycline (VIBRAMYCIN) 100 MG capsule Take 1 capsule (100 mg total) by mouth 2 (two) times daily. Patient not taking: No sig reported 07/22/20   Copland, Gay Filler, MD  metoprolol tartrate (LOPRESSOR) 50 MG tablet Take 2 hours before CT scan Patient not taking: Reported on 10/23/2020 08/29/20   Tobb, Kardie, DO  nitroGLYCERIN (NITROSTAT) 0.4 MG SL  tablet Place 1 tablet (0.4 mg total) under the tongue every 5 (five) minutes as needed for chest pain. 11/08/19   Chandrasekhar, Lyda Kalata A, MD  rosuvastatin (CRESTOR) 10 MG tablet Take 1 tablet (10 mg total) by mouth at bedtime. 07/30/20   Roma Schanz R, DO  sildenafil (REVATIO) 20 MG tablet TAKE 1-3 TABLETS BY MOUTH PRIOR TO SEXUAL ACTIVITY AS NEEDED Patient taking differently: Take 20 mg by mouth as needed (ed). 01/11/20   Copland, Gay Filler, MD  tamsulosin (FLOMAX) 0.4 MG CAPS capsule Take 1 capsule (0.4 mg total) by mouth daily. Patient not taking: No sig reported 04/03/20   Copland, Gay Filler, MD  tiotropium (SPIRIVA HANDIHALER) 18 MCG inhalation capsule Place 1 capsule (18 mcg total) into inhaler and inhale daily. 05/31/20   Copland, Gay Filler, MD  traMADol (ULTRAM) 50 MG tablet TAKE 1 BY MOUTH DAILY AS NEEDED FOR BACK PAIN Patient not taking: Reported on 10/23/2020 01/31/20   Copland, Gay Filler, MD  Vitamin D, Ergocalciferol, (DRISDOL) 1.25 MG (50000 UNIT) CAPS capsule Take 1 capsule (50,000 Units total) by mouth every 7 (seven) days. 09/02/20   Tobb, Kardie, DO    Allergies    Plavix [clopidogrel bisulfate]; Afrin [nasal spray]; Antihistamines, chlorpheniramine-type; Benadryl [diphenhydramine hcl]; Buprenorphine hcl; Carisoprodol; Codeine; Metoclopramide; Morphine and related; Oxymetazoline; and Pheniramine  Review of Systems   Review of Systems  Constitutional:  Negative for appetite change.  HENT:  Negative for congestion.   Respiratory:  Positive for cough.   Cardiovascular:  Positive for chest pain.  Gastrointestinal:  Negative for abdominal pain.  Genitourinary:  Negative for flank pain.  Musculoskeletal:  Negative for back pain.  Psychiatric/Behavioral:  Positive for confusion.    Physical Exam Updated Vital Signs BP 131/90   Pulse (!) 53   Temp 98 F (36.7 C) (Oral)   Resp 20   SpO2 97%   Physical Exam Vitals and nursing note reviewed.  HENT:     Head: Atraumatic.   Eyes:     Pupils: Pupils are equal, round, and reactive to light.  Cardiovascular:     Rate and Rhythm: Regular rhythm.  Pulmonary:     Breath sounds: No wheezing or rhonchi.  Abdominal:     Tenderness: There is no abdominal tenderness.  Musculoskeletal:        General: No tenderness.     Cervical back: Neck supple.  Skin:    General: Skin is warm.     Capillary Refill: Capillary refill takes less than 2 seconds.  Neurological:     Mental Status: He is alert and oriented to person, place, and time.  ED Results / Procedures / Treatments   Labs (all labs ordered are listed, but only abnormal results are displayed) Labs Reviewed  COMPREHENSIVE METABOLIC PANEL - Abnormal; Notable for the following components:      Result Value   Glucose, Bld 103 (*)    Calcium 8.7 (*)    All other components within normal limits  CBC WITH DIFFERENTIAL/PLATELET  URINALYSIS, ROUTINE W REFLEX MICROSCOPIC  TROPONIN I (HIGH SENSITIVITY)    EKG EKG Interpretation  Date/Time:  Friday November 22 2020 09:53:53 EDT Ventricular Rate:  64 PR Interval:  164 QRS Duration: 94 QT Interval:  394 QTC Calculation: 406 R Axis:   -8 Text Interpretation: Normal sinus rhythm Normal ECG Confirmed by Dorie Rank [phone]) on 11/23/2020 8:39:22 PM  Radiology No results found.  Procedures Procedures   Medications Ordered in ED Medications - No data to display  ED Course  I have reviewed the triage vital signs and the nursing notes.  Pertinent labs & imaging results that were available during my care of the patient were reviewed by me and considered in my medical decision making (see chart for details).    MDM Rules/Calculators/A&P                           Patient multiple different complaints.  High blood pressure confusion shortness of breath anxiety.  Work-up is reassuring.  Has had MRI already.  Does have PCP follow-up.  Has had some changes in his medications and wonders if that could also be a  cause.  Also worried about anxiety could be a cause.  From an ER standpoint this is reassuring.  Do not see acute issue here.  Will adjust medicines as needed according to blood pressure at home.  We will keep a log to follow with PCP.  Do not think the aortic root aneurysm is causing these issues.  Will discharge home Final Clinical Impression(s) / ED Diagnoses Final diagnoses:  Hypertension, unspecified type    Rx / DC Orders ED Discharge Orders          Ordered    amLODipine (NORVASC) 5 MG tablet  Daily        11/22/20 1856             Davonna Belling, MD 12/06/20 [phone]

## 2020-12-11 ENCOUNTER — Other Ambulatory Visit: Payer: Self-pay

## 2020-12-11 ENCOUNTER — Ambulatory Visit (INDEPENDENT_AMBULATORY_CARE_PROVIDER_SITE_OTHER): Payer: Self-pay | Admitting: Family Medicine

## 2020-12-11 VITALS — BP 120/78 | HR 84 | Temp 98.3°F | Resp 18 | Ht 75.0 in | Wt 186.8 lb

## 2020-12-11 DIAGNOSIS — R0989 Other specified symptoms and signs involving the circulatory and respiratory systems: Secondary | ICD-10-CM

## 2020-12-11 DIAGNOSIS — R361 Hematospermia: Secondary | ICD-10-CM

## 2020-12-11 DIAGNOSIS — F411 Generalized anxiety disorder: Secondary | ICD-10-CM

## 2020-12-11 DIAGNOSIS — D45 Polycythemia vera: Secondary | ICD-10-CM

## 2020-12-11 NOTE — Progress Notes (Signed)
[facility] [address], [place], [place] [address] [phone]  Date:  12/11/2020   Name:  [facility]   DOB:  02/18/1970   MRN:  [id number]  PCP:  Darreld Mclean, MD    Chief Complaint: ER follow up (11/22/20: HTN/Concerns/ questions: 1. Blood in the Seemen once, no pain. P2. t has automatic cuff to compare readings)   History of Present Illness:  [facility] is a 50 y.o. very pleasant male patient who presents with the following:  Pt is here today for follow-up from the ER  Last seen by myself in June of this year  history of anxiety and depression, sleep apnea, secondary polycythemia Married to [place], he has a Freight forwarder.  [name] accompanies him to his visit today   He likely also does have COPD/ emphysema from recent CT chest -current smoker  They feel that over the last month he will seem "disoriented" periodically - they do not think this is a panic attack.  He will feel "like I am not getting it" mentally when this occurs. He feels like his memory is not as good as it was, he has to write things down as opposed to remembering them MRI brain 9/22 was normal  He was seen on 10/28: Patient multiple different complaints.  High blood pressure confusion shortness of breath anxiety.  Work-up is reassuring.  Has had MRI already.  Does have PCP follow-up.  Has had some changes in his medications and wonders if that could also be a cause.  Also worried about anxiety could be a cause.  From an ER standpoint this is reassuring.  Do not see acute issue here.  Will adjust medicines as needed according to blood pressure at home.  We will keep a log to follow with PCP.  Do not think the aortic root aneurysm is causing these issues.  Will discharge home  BP Readings from Last 3 Encounters:  12/11/20 120/78  11/22/20 131/90  10/23/20 129/75   Today pt is concerned about BP variation - this has been a  problem for perhaps 4 to 6 weeks He notes that his SBP may be 160 some of the time; this tends to occur when he has episodes of being "out of it" We attempted to check his blood pressure against ours today but it kept getting an error message  He had blood in his semen on one occasion- no pain or other symptoms Occurred about a week ago   He is on a lifting restriction due to his aneurysm- not able to lift over 30 lbs However he has been working much harder over the last 2-3 weeks   He is still smoking about a pack a day - he quit for a little while but then went back to it  Continued to encourage him to quit and he is trying  He is taking his crestor   Patient has declined routine immunizations on several occasions in the past  Patient Active Problem List   Diagnosis Date Noted   Allergy 08/19/2020   Anxiety 08/19/2020   Arthritis 08/19/2020   Bell's palsy 08/19/2020   Chest pain 07/26/2020   Family history of early CAD 07/26/2020   DOE (dyspnea on exertion) 12/14/2019   Angina pectoris ([place]) 11/03/2019   Tobacco abuse 11/03/2019   Other hyperlipidemia 11/03/2019   Tonsillar hypertrophy 04/30/2017   Obstructive sleep apnea 04/23/2017   Polycythemia,  secondary 09/29/2016   Polycythemia vera ([place]) 05/03/2015   Hemoptysis 04/03/2015   Condylomata acuminata 01/18/2015   Generalized anxiety disorder 03/05/2014    Past Medical History:  Diagnosis Date   Allergy    Anxiety    Arthritis    Bell's palsy    Erythrocytosis 04/10/2015   Hyperlipidemia    Polycythemia vera ([place]) 05/03/2015    Past Surgical History:  Procedure Laterality Date   DENTAL SURGERY     HAND SURGERY     TONSILLECTOMY Bilateral 04/30/2017   Procedure: TONSILLECTOMY;  Surgeon: Jerrell Belfast, MD;  Location: [facility];  Service: ENT;  Laterality: Bilateral;    Social History   Tobacco Use   Smoking status: Every Day    Packs/day: 0.25    Years: 24.00    Pack years: 6.00    Types:  Cigarettes   Smokeless tobacco: Never  Vaping Use   Vaping Use: Never used  Substance Use Topics   Alcohol use: Yes    Alcohol/week: 1.0 standard drink    Types: 1 Cans of beer per week    Comment: 6-10 beer a week   Drug use: Yes    Types: Other-see comments, Marijuana    Comment: Marijuana every other day.    Family History  Problem Relation Age of Onset   COPD Mother    Fibromyalgia Mother    Heart disease Father    Stroke Father    Depression Brother    Heart disease Brother    Heart disease Maternal Grandmother    Stroke Maternal Grandmother    Heart disease Maternal Grandfather    Stroke Maternal Grandfather    Heart disease Paternal Grandmother    Stroke Paternal Grandmother    Heart disease Paternal Grandfather    Stroke Paternal Grandfather    Stroke Paternal Aunt     Allergies  Allergen Reactions   Plavix [Clopidogrel Bisulfate] Other (See Comments)    Confusion, lethargy, slurred speech   Afrin [Nasal Spray] Hives   Antihistamines, Chlorpheniramine-Type Hives   Benadryl [Diphenhydramine Hcl] Hives   Buprenorphine Hcl Hives   Carisoprodol Hives   Codeine Hives   Metoclopramide Hives   Morphine And Related Hives   Oxymetazoline Hives   Pheniramine Hives and Rash    Medication list has been reviewed and updated.  Current Outpatient Medications on File Prior to Visit  Medication Sig Dispense Refill   ALPRAZolam (XANAX) 1 MG tablet TAKE 1 TABLET BY MOUTH THREE TIMES A DAY AS NEEDED FOR ANXIETY 90 tablet 0   albuterol (VENTOLIN HFA) 108 (90 Base) MCG/ACT inhaler Inhale 1-2 puffs into the lungs every 6 (six) hours as needed for wheezing or shortness of breath. 18 g 3   amLODipine (NORVASC) 5 MG tablet Take 1 tablet (5 mg total) by mouth daily. Do not take the medicine if your blood pressure is less than 098 systolic in the morning 30 tablet 0   aspirin EC 81 MG tablet Take 81 mg by mouth daily. Swallow whole.     nitroGLYCERIN (NITROSTAT) 0.4 MG SL tablet  Place 1 tablet (0.4 mg total) under the tongue every 5 (five) minutes as needed for chest pain. 25 tablet 6   rosuvastatin (CRESTOR) 10 MG tablet Take 1 tablet (10 mg total) by mouth at bedtime. 30 tablet 2   sildenafil (REVATIO) 20 MG tablet TAKE 1-3 TABLETS BY MOUTH PRIOR TO SEXUAL ACTIVITY AS NEEDED (Patient taking differently: Take 20 mg by mouth as needed (ed).) 30  tablet 5   tiotropium (SPIRIVA HANDIHALER) 18 MCG inhalation capsule Place 1 capsule (18 mcg total) into inhaler and inhale daily. 30 capsule 12   traMADol (ULTRAM) 50 MG tablet TAKE 1 BY MOUTH DAILY AS NEEDED FOR BACK PAIN (Patient not taking: Reported on 10/23/2020) 30 tablet 0   Vitamin D, Ergocalciferol, (DRISDOL) 1.25 MG (50000 UNIT) CAPS capsule Take 1 capsule (50,000 Units total) by mouth every 7 (seven) days. 12 capsule 0   No current facility-administered medications on file prior to visit.    Review of Systems:  As per HPI- otherwise negative.   Physical Examination: Vitals:   12/11/20 1106  BP: 120/78  Pulse: 84  Resp: 18  Temp: 98.3 F (36.8 C)  SpO2: 97%   Vitals:   12/11/20 1106  Weight: 186 lb 12.8 oz (84.7 kg)  Height: 6\' 3"  (1.905 m)   Body mass index is 23.35 kg/m. Ideal Body Weight: Weight in (lb) to have BMI = 25: 199.6  GEN: no acute distress.  Looks well, tall and slender build HEENT: Atraumatic, Normocephalic.  Ears and Nose: No external deformity. CV: RRR, No M/G/R. No JVD. No thrill. No extra heart sounds. PULM: CTA B, no wheezes, crackles, rhonchi. No retractions. No resp. distress. No accessory muscle use. ABD: S, NT, ND, +BS. No rebound. No HSM.  Belly is benign EXTR: No c/c/e PSYCH: Normally interactive. Conversant.    Assessment and Plan: Hematospermia  Generalized anxiety disorder  Labile blood pressure  Polycythemia vera ([place])  Patient seen today with most prominent concern of labile blood pressure and episodes where he may feel weak, tired, like he is not thinking  normally, along with what high blood pressure.  Given patient history I do suspect this may be panic attacks.  We discussed doing a 24-hour blood pressure monitor, for the time being he declines.  He is currently taking amlodipine only on days when his blood pressure is elevated I advised him if 1 of these episodes does occur he might try taking his alprazolam, sit down and rest.  Would also be okay to use a dose of amlodipine if blood pressure is high He is seeing his cardiologist next month and will also discuss this issue with her  Anxiety and stress continue to be significant concerns.  He is taking alprazolam 1 mg 3 times daily-has not generally been interested in other medication therapies  I again encouraged Rehan to quit smoking, explained this is one of the most important things he can do for his health.  He does see hematology regularly to follow-up on polycythemia  Discussed 1 episode of hematospermia, about 1 week ago.  He has no symptoms or discomfort.  I offered to have him see urology, for the time being they would like to observe.  If this does occur again they would let me know.  I doubt a urine culture would be of utility as he has no symptoms  Signed Lamar Blinks, MD

## 2020-12-11 NOTE — Patient Instructions (Addendum)
It was good to see you again today- hang in there !  Work on cutting down on smoking and sweets/ junk food- however your diet does not have to be perfect  Keep a BP pill with you- if you have an attack of high blood pressure and feel strange you can take a dose if you need to. Taking a xanax and resting may help also!   Let me know if you decide you would like to do the 24 hour BP monitor - otherwise let's have you see Dr Harriet Masson next month as planned  Let me know if any more blood in your semen!

## 2020-12-16 ENCOUNTER — Other Ambulatory Visit: Payer: Self-pay | Admitting: Family Medicine

## 2020-12-18 ENCOUNTER — Other Ambulatory Visit: Payer: Self-pay

## 2020-12-26 ENCOUNTER — Inpatient Hospital Stay: Payer: Self-pay

## 2020-12-27 ENCOUNTER — Inpatient Hospital Stay: Payer: Self-pay | Attending: Family

## 2020-12-27 ENCOUNTER — Other Ambulatory Visit: Payer: Self-pay

## 2020-12-27 ENCOUNTER — Inpatient Hospital Stay: Payer: Self-pay

## 2020-12-27 DIAGNOSIS — D751 Secondary polycythemia: Secondary | ICD-10-CM | POA: Insufficient documentation

## 2020-12-27 DIAGNOSIS — D500 Iron deficiency anemia secondary to blood loss (chronic): Secondary | ICD-10-CM

## 2020-12-27 DIAGNOSIS — F17210 Nicotine dependence, cigarettes, uncomplicated: Secondary | ICD-10-CM | POA: Insufficient documentation

## 2020-12-27 LAB — CMP (CANCER CENTER ONLY)
ALT: 15 U/L (ref 0–44)
AST: 16 U/L (ref 15–41)
Albumin: 4.3 g/dL (ref 3.5–5.0)
Alkaline Phosphatase: 51 U/L (ref 38–126)
Anion gap: 6 (ref 5–15)
BUN: 15 mg/dL (ref 6–20)
CO2: 29 mmol/L (ref 22–32)
Calcium: 9.6 mg/dL (ref 8.9–10.3)
Chloride: 104 mmol/L (ref 98–111)
Creatinine: 1.15 mg/dL (ref 0.61–1.24)
GFR, Estimated: >60 mL/min (ref >60)
Glucose, Bld: 74 mg/dL (ref 70–99)
Potassium: 4.2 mmol/L (ref 3.5–5.1)
Sodium: 139 mmol/L (ref 135–145)
Total Bilirubin: 0.5 mg/dL (ref 0.3–1.2)
Total Protein: 6.8 g/dL (ref 6.5–8.1)

## 2020-12-27 LAB — CBC WITH DIFFERENTIAL (CANCER CENTER ONLY)
Abs Immature Granulocytes: 0.02 10*3/uL (ref 0.00–0.07)
Basophils Absolute: 0.1 10*3/uL (ref 0.0–0.1)
Basophils Relative: 1 %
Eosinophils Absolute: 0.1 10*3/uL (ref 0.0–0.5)
Eosinophils Relative: 1 %
HCT: 44.2 % (ref 39.0–52.0)
Hemoglobin: 15 g/dL (ref 13.0–17.0)
Immature Granulocytes: 0 %
Lymphocytes Relative: 19 %
Lymphs Abs: 2 10*3/uL (ref 0.7–4.0)
MCH: 30.5 pg (ref 26.0–34.0)
MCHC: 33.9 g/dL (ref 30.0–36.0)
MCV: 89.8 fL (ref 80.0–100.0)
Monocytes Absolute: 0.9 10*3/uL (ref 0.1–1.0)
Monocytes Relative: 8 %
Neutro Abs: 7.5 10*3/uL (ref 1.7–7.7)
Neutrophils Relative %: 71 %
Platelet Count: 254 10*3/uL (ref 150–400)
RBC: 4.92 MIL/uL (ref 4.22–5.81)
RDW: 13.2 % (ref 11.5–15.5)
WBC Count: 10.6 10*3/uL — ABNORMAL HIGH (ref 4.0–10.5)
nRBC: 0 % (ref 0.0–0.2)

## 2020-12-27 NOTE — Progress Notes (Signed)
Pt. Does not need a phlebotomy today.

## 2020-12-30 LAB — FERRITIN: Ferritin: 12 ng/mL — ABNORMAL LOW (ref 24–336)

## 2020-12-30 LAB — IRON AND TIBC
Iron: 74 ug/dL (ref 42–163)
Saturation Ratios: 17 % — ABNORMAL LOW (ref 20–55)
TIBC: 436 ug/dL — ABNORMAL HIGH (ref 202–409)
UIBC: 363 ug/dL (ref 117–376)

## 2021-01-01 ENCOUNTER — Other Ambulatory Visit: Payer: Self-pay | Admitting: Family Medicine

## 2021-01-01 DIAGNOSIS — F411 Generalized anxiety disorder: Secondary | ICD-10-CM

## 2021-01-06 ENCOUNTER — Ambulatory Visit (INDEPENDENT_AMBULATORY_CARE_PROVIDER_SITE_OTHER): Payer: Self-pay | Admitting: Cardiology

## 2021-01-06 ENCOUNTER — Ambulatory Visit (INDEPENDENT_AMBULATORY_CARE_PROVIDER_SITE_OTHER): Payer: Self-pay

## 2021-01-06 ENCOUNTER — Encounter: Payer: Self-pay | Admitting: Cardiology

## 2021-01-06 ENCOUNTER — Other Ambulatory Visit: Payer: Self-pay

## 2021-01-06 VITALS — BP 122/94 | HR 71 | Ht 75.0 in | Wt 183.6 lb

## 2021-01-06 DIAGNOSIS — I77810 Thoracic aortic ectasia: Secondary | ICD-10-CM

## 2021-01-06 DIAGNOSIS — I2510 Atherosclerotic heart disease of native coronary artery without angina pectoris: Secondary | ICD-10-CM

## 2021-01-06 DIAGNOSIS — E782 Mixed hyperlipidemia: Secondary | ICD-10-CM

## 2021-01-06 DIAGNOSIS — R002 Palpitations: Secondary | ICD-10-CM

## 2021-01-06 MED ORDER — CARVEDILOL 3.125 MG PO TABS: 3.1250 mg | ORAL_TABLET | Freq: Two times a day (BID) | ORAL | 3 refills | Status: DC

## 2021-01-06 NOTE — Progress Notes (Unsigned)
Enrolled for [employer] to mail a ZIO XT long term holter monitor to the patients address on file.  

## 2021-01-06 NOTE — Progress Notes (Signed)
Cardiology Office Note:    Date:  01/09/2021   ID:  Stuart Hill, DOB [birth year], MRN [id number]  PCP:  Darreld Mclean, MD  Cardiologist:  None  Electrophysiologist:  None   Referring MD: Darreld Mclean, MD   " I am worried"   History of Present Illness:    Stuart Hill is a 50 y.o. male with a hx of COPD, hyperlipidemia, polycythemia vera, dilatation of the proximal ascending aorta seen on echocardiogram, minimal coronary artery disease is here today for follow-up visit.  For saw the patient in August 2022 at that time he was experiencing chest discomfort and has been referred by his primary care provider.  Given the risk factors we will send the patient for a coronary CTA and he also did have some shortness of breath so I sent him for echocardiogram.  Those results have previously been discussed with the patient.  Is here today requested to be seen because he tells me he started experiencing significant palpitations and shortness of breath.  He described as abrupt onset of fast heartbeat which last for minutes at a time when he starts doing activity.  He has not had any syncope episodes or lightheadedness or dizziness.  The episodes are frequent but he is concerning. He is here with his wife who is almost 20 years noting that she is specifically worried about those episodes.  No other complaints at this time.  Past Medical History:  Diagnosis Date   Allergy    Anxiety    Arthritis    Bell's palsy    Erythrocytosis 04/10/2015   Hyperlipidemia    Polycythemia vera ([place]) 05/03/2015    Past Surgical History:  Procedure Laterality Date   DENTAL SURGERY     HAND SURGERY     TONSILLECTOMY Bilateral 04/30/2017   Procedure: TONSILLECTOMY;  Surgeon: Jerrell Belfast, MD;  Location: [facility];  Service: ENT;  Laterality: Bilateral;    Current Medications: Current Meds  Medication Sig   albuterol (VENTOLIN HFA) 108 (90 Base) MCG/ACT inhaler Inhale 1-2  puffs into the lungs every 6 (six) hours as needed for wheezing or shortness of breath.   ALPRAZolam (XANAX) 1 MG tablet TAKE 1 TABLET BY MOUTH THREE TIMES A DAY AS NEEDED FOR ANXIETY   aspirin EC 81 MG tablet Take 81 mg by mouth daily. Swallow whole. Patient take two pills daily.   carvedilol (COREG) 3.125 MG tablet Take 1 tablet (3.125 mg total) by mouth 2 (two) times daily.   rosuvastatin (CRESTOR) 10 MG tablet TAKE 1 TABLET BY MOUTH ONCE DAILY AT BEDTIME   sildenafil (REVATIO) 20 MG tablet TAKE 1-3 TABLETS BY MOUTH PRIOR TO SEXUAL ACTIVITY AS NEEDED (Patient taking differently: Take 20 mg by mouth as needed (ed).)   [DISCONTINUED] amLODipine (NORVASC) 5 MG tablet Take 1 tablet (5 mg total) by mouth daily. Do not take the medicine if your blood pressure is less than 703 systolic in the morning     Allergies:   Plavix [clopidogrel bisulfate]; Afrin [nasal spray]; Antihistamines, chlorpheniramine-type; Benadryl [diphenhydramine hcl]; Buprenorphine hcl; Carisoprodol; Codeine; Metoclopramide; Morphine and related; Oxymetazoline; and Pheniramine   Social History   Socioeconomic History   Marital status: Married    Spouse name: Not on file   Number of children: Not on file   Years of education: Not on file   Highest education level: Not on file  Occupational History   Not on file  Tobacco Use   Smoking status: Every  Day    Packs/day: 0.25    Years: 24.00    Pack years: 6.00    Types: Cigarettes   Smokeless tobacco: Never  Vaping Use   Vaping Use: Never used  Substance and Sexual Activity   Alcohol use: Yes    Alcohol/week: 1.0 standard drink    Types: 1 Cans of beer per week    Comment: 6-10 beer a week   Drug use: Yes    Types: Other-see comments, Marijuana    Comment: Marijuana every other day.   Sexual activity: Yes  Other Topics Concern   Not on file  Social History Narrative   Not on file   Social Determinants of Health   Financial Resource Strain: Not on file  Food  Insecurity: Not on file  Transportation Needs: Not on file  Physical Activity: Not on file  Stress: Not on file  Social Connections: Not on file     Family History: The patient's family history includes COPD in his mother; Depression in his brother; Fibromyalgia in his mother; Heart disease in his brother, father, maternal grandfather, maternal grandmother, paternal grandfather, and paternal grandmother; Stroke in his father, maternal grandfather, maternal grandmother, paternal aunt, paternal grandfather, and paternal grandmother.  ROS:   Review of Systems  Constitution: Negative for decreased appetite, fever and weight gain.  HENT: Negative for congestion, ear discharge, hoarse voice and sore throat.   Eyes: Negative for discharge, redness, vision loss in right eye and visual halos.  Cardiovascular: Negative for chest pain, dyspnea on exertion, leg swelling, orthopnea and palpitations.  Respiratory: Negative for cough, hemoptysis, shortness of breath and snoring.   Endocrine: Negative for heat intolerance and polyphagia.  Hematologic/Lymphatic: Negative for bleeding problem. Does not bruise/bleed easily.  Skin: Negative for flushing, nail changes, rash and suspicious lesions.  Musculoskeletal: Negative for arthritis, joint pain, muscle cramps, myalgias, neck pain and stiffness.  Gastrointestinal: Negative for abdominal pain, bowel incontinence, diarrhea and excessive appetite.  Genitourinary: Negative for decreased libido, genital sores and incomplete emptying.  Neurological: Negative for brief paralysis, focal weakness, headaches and loss of balance.  Psychiatric/Behavioral: Negative for altered mental status, depression and suicidal ideas.  Allergic/Immunologic: Negative for HIV exposure and persistent infections.    EKGs/Labs/Other Studies Reviewed:    The following studies were reviewed today:   EKG:  None today   CCTA 09/06/2020 mage quality: Good.   Noise artifact is:  Limited.   Coronary calcium score is 24, which places the patient in the 80th percentile for age and sex matched control.   Coronary arteries: Normal coronary origins.  Right dominance.   Right Coronary Artery: No detectable plaque or stenosis.   Left Main Coronary Artery: No detectable plaque or stenosis.   Left Anterior Descending Coronary Artery: Minimal mixed atherosclerotic plaque in the mid LAD, <25% stenosis   Ramus intermedius: No detectable plaque or stenosis.   Left Circumflex Artery: No detectable plaque or stenosis.   Aorta:   Measurements made in double oblique technique:   Sinus of Valsalva:   R-L: 43 mm   L-Non: 43 mm   R-Non: 43 mm   Mid ascending aorta (at PA bifurcation): 30 mm   Aortic Valve: No calcifications.   Other findings:   Normal pulmonary vein drainage into the left atrium.   Normal left atrial appendage without a thrombus.   Borderline dilation of main pulmonary artery.   IMPRESSION: 1. Minimal CAD in mid LAD, CADRADS = 1.   2. Coronary calcium score is  24, which places the patient in the 80th percentile for age and sex matched control.   3. Normal coronary origin with right dominance.   4. Mild ascending aorta dilation, 43 mm at sinus of Valsalva.    TTE 09/02/2020 IMPRESSIONS   1. Left ventricular ejection fraction, by estimation, is 55%. The left  ventricle has normal function. The left ventricle has no regional wall  motion abnormalities. Left ventricular diastolic parameters were normal.   2. Right ventricular systolic function is normal. The right ventricular  size is normal. Tricuspid regurgitation signal is inadequate for assessing  PA pressure.   3. The mitral valve is normal in structure. Trivial mitral valve  regurgitation. No evidence of mitral stenosis.   4. The aortic valve is grossly normal. Unable to determine aortic valve  morphology due to image quality. Aortic valve regurgitation is not  visualized. No  aortic stenosis is present.   5. Aortic dilatation noted. There is mild dilatation of the aortic root,  measuring 41 mm.   6. The inferior vena cava is normal in size with greater than 50%  respiratory variability, suggesting right atrial pressure of 3 mmHg.   FINDINGS   Left Ventricle: Left ventricular ejection fraction, by estimation, is  55%. The left ventricle has normal function. The left ventricle has no  regional wall motion abnormalities. The left ventricular internal cavity  size was normal in size. There is no  left ventricular hypertrophy. Left ventricular diastolic parameters were  normal.   Right Ventricle: The right ventricular size is normal. No increase in  right ventricular wall thickness. Right ventricular systolic function is  normal. Tricuspid regurgitation signal is inadequate for assessing PA  pressure.   Left Atrium: Left atrial size was normal in size.   Right Atrium: Right atrial size was normal in size.   Pericardium: There is no evidence of pericardial effusion.   Mitral Valve: The mitral valve is normal in structure. Trivial mitral  valve regurgitation. No evidence of mitral valve stenosis.   Tricuspid Valve: The tricuspid valve is normal in structure. Tricuspid  valve regurgitation is trivial. No evidence of tricuspid stenosis.   Aortic Valve: The aortic valve is grossly normal. Aortic valve  regurgitation is not visualized. No aortic stenosis is present.   Pulmonic Valve: The pulmonic valve was not well visualized. Pulmonic valve  regurgitation is not visualized. No evidence of pulmonic stenosis.   Aorta: Aortic dilatation noted. There is mild dilatation of the aortic  root, measuring 41 mm.   Venous: The inferior vena cava is normal in size with greater than 50%  respiratory variability, suggesting right atrial pressure of 3 mmHg.   IAS/Shunts: No atrial level shunt detected by color flow Doppler.    Recent Labs: 08/29/2020: TSH  0.600 12/27/2020: ALT 15; BUN 15; Creatinine 1.15; Hemoglobin 15.0; Platelet Count 254; Potassium 4.2; Sodium 139  Recent Lipid Panel    Component Value Date/Time   CHOL 205 (H) 07/26/2020 1131   CHOL 203 (H) 11/03/2019 1052   TRIG 178.0 (H) 07/26/2020 1131   HDL 75.80 07/26/2020 1131   HDL 65 11/03/2019 1052   CHOLHDL 3 07/26/2020 1131   VLDL 35.6 07/26/2020 1131   LDLCALC 94 07/26/2020 1131   LDLCALC 121 (H) 11/03/2019 1052    Physical Exam:    VS:  BP (!) 122/94   Pulse 71   Ht 6\' 3"  (1.905 m)   Wt 183 lb 9.6 oz (83.3 kg)   SpO2 95%   BMI  22.95 kg/m     Wt Readings from Last 3 Encounters:  01/06/21 183 lb 9.6 oz (83.3 kg)  12/11/20 186 lb 12.8 oz (84.7 kg)  10/23/20 186 lb 1.9 oz (84.4 kg)     GEN: Well nourished, well developed in no acute distress HEENT: Normal NECK: No JVD; No carotid bruits LYMPHATICS: No lymphadenopathy CARDIAC: S1S2 noted,RRR, no murmurs, rubs, gallops RESPIRATORY:  Clear to auscultation without rales, wheezing or rhonchi  ABDOMEN: Soft, non-tender, non-distended, +bowel sounds, no guarding. EXTREMITIES: No edema, No cyanosis, no clubbing MUSCULOSKELETAL:  No deformity  SKIN: Warm and dry NEUROLOGIC:  Alert and oriented x 3, non-focal PSYCHIATRIC:  Normal affect, good insight  ASSESSMENT:    1. Palpitations   2. Minimal CAD   3. Mixed hyperlipidemia   4. Ascending aorta dilatation (HCC)    PLAN:     We will place to monitor the patient for 14 days to rule out any cardiac arrhythmias.  He does have some elevated blood pressure, I will start the patient on Coreg 3.125 mg twice daily hopefully will help with his blood pressure.  I talked to the patient again about his CT findings as well as his echocardiogram finding.  All of the questions with he and his wife has been answered.  Their questions about his mildly dilated ascending aorta I was able to answer all of their questions to their satisfaction.  Plan for repeat CT imaging in 1  year.  Minimal coronary artery disease continuing the aspirin and statin.  I do think anxiety also is playing a big role with the patient episodes.  And I do think his wife also is very anxious which makes the patient's upset that could cause worsening anxiety as well.  I spoke with the both of them about this.  The patient's wife is getting therapy.  The patient takes Xanax as needed.  The patient is in agreement with the above plan. The patient left the office in stable condition.  The patient will follow up in   Medication Adjustments/Labs and Tests Ordered: Current medicines are reviewed at length with the patient today.  Concerns regarding medicines are outlined above.  Orders Placed This Encounter  Procedures   LONG TERM MONITOR (3-14 DAYS)   Meds ordered this encounter  Medications   carvedilol (COREG) 3.125 MG tablet    Sig: Take 1 tablet (3.125 mg total) by mouth 2 (two) times daily.    Dispense:  180 tablet    Refill:  3    Patient Instructions  Medication Instructions:  Your physician has recommended you make the following change in your medication:  STOP: Amlodipine START: Coreg 3.125 mg twice daily *If you need a refill on your cardiac medications before your next appointment, please call your pharmacy*   Lab Work: None If you have labs (blood work) drawn today and your tests are completely normal, you will receive your results only by: [facility] (if you have MyChart) OR A paper copy in the mail If you have any lab test that is abnormal or we need to change your treatment, we will call you to review the results.   Testing/Procedures: [place] [place]- Long Term Monitor Instructions  Your physician has requested you wear a ZIO patch monitor for 14 days.  This is a single patch monitor. [employer] supplies one patch monitor per enrollment. Additional stickers are not available. Please do not apply patch if you will be having a Nuclear Stress Test,  Echocardiogram,  Cardiac  CT, MRI, or Chest Xray during the period you would be wearing the  monitor. The patch cannot be worn during these tests. You cannot remove and re-apply the  ZIO XT patch monitor.  Your ZIO patch monitor will be mailed 3 day USPS to your address on file. It may take 3-5 days  to receive your monitor after you have been enrolled.  Once you have received your monitor, please review the enclosed instructions. Your monitor  has already been registered assigning a specific monitor serial # to you.  Billing and Patient Assistance Program Information  We have supplied [employer] with any of your insurance information on file for billing purposes. [employer] offers a sliding scale Patient Assistance Program for patients that do not have  insurance, or whose insurance does not completely cover the cost of the ZIO monitor.  You must apply for the Patient Assistance Program to qualify for this discounted rate.  To apply, please call [employer] at [phone], select option 4, select option 2, ask to apply for  Patient Assistance Program. Theodore Demark will ask your household income, and how many people  are in your household. They will quote your out-of-pocket cost based on that information.  [employer] will also be able to set up a 65-month, interest-free payment plan if needed.  Applying the monitor   Shave hair from upper left chest.  Hold abrader disc by orange tab. Rub abrader in 40 strokes over the upper left chest as  indicated in your monitor instructions.  Clean area with 4 enclosed alcohol pads. Let dry.  Apply patch as indicated in monitor instructions. Patch will be placed under collarbone on left  side of chest with arrow pointing upward.  Rub patch adhesive wings for 2 minutes. Remove white label marked "1". Remove the white  label marked "2". Rub patch adhesive wings for 2 additional minutes.  While looking in a mirror, press and release button in center of patch. A small green light will   flash 3-4 times. This will be your only indicator that the monitor has been turned on.  Do not shower for the first 24 hours. You may shower after the first 24 hours.  Press the button if you feel a symptom. You will hear a small click. Record Date, Time and  Symptom in the Patient Logbook.  When you are ready to remove the patch, follow instructions on the last 2 pages of Patient  Logbook. Stick patch monitor onto the last page of Patient Logbook.  Place Patient Logbook in the blue and white box. Use locking tab on box and tape box closed  securely. The blue and white box has prepaid postage on it. Please place it in the mailbox as  soon as possible. Your physician should have your test results approximately 7 days after the  monitor has been mailed back to [facility].  Call [facility] at [phone] if you have questions regarding  your ZIO XT patch monitor. Call them immediately if you see an orange light blinking on your  monitor.  If your monitor falls off in less than 4 days, contact our Monitor department at [phone].  If your monitor becomes loose or falls off after 4 days call [employer] at [phone] for  suggestions on securing your monitor    Follow-Up: At [facility], you and your health needs are our priority.  As part of our continuing mission to provide you with exceptional heart care, we have created designated Provider Care Teams.  These Care Teams include your primary Cardiologist (physician) and Advanced Practice Providers (APPs -  Physician Assistants and Nurse Practitioners) who all work together to provide you with the care you need, when you need it.  We recommend signing up for the patient portal called "MyChart".  Sign up information is provided on this After Visit Summary.  MyChart is used to connect with patients for Virtual Visits (Telemedicine).  Patients are able to view lab/test results, encounter notes, upcoming appointments,  etc.  Non-urgent messages can be sent to your provider as well.   To learn more about what you can do with MyChart, go to [email or web address].    Your next appointment:   6 month(s)  The format for your next appointment:   In Person  Provider:   Berniece Salines, DO   Other Instructions     Adopting a Healthy Lifestyle.  Know what a healthy weight is for you (roughly BMI <25) and aim to maintain this   Aim for 7+ servings of fruits and vegetables daily   65-80+ fluid ounces of water or unsweet tea for healthy kidneys   Limit to max 1 drink of alcohol per day; avoid smoking/tobacco   Limit animal fats in diet for cholesterol and heart health - choose grass fed whenever available   Avoid highly processed foods, and foods high in saturated/trans fats   Aim for low stress - take time to unwind and care for your mental health   Aim for 150 min of moderate intensity exercise weekly for heart health, and weights twice weekly for bone health   Aim for 7-9 hours of sleep daily   When it comes to diets, agreement about the perfect plan isnt easy to find, even among the experts. Experts at the [employer] developed an idea known as the Healthy Eating Plate. Just imagine a plate divided into logical, healthy portions.   The emphasis is on diet quality:   Load up on vegetables and fruits - one-half of your plate: Aim for color and variety, and remember that potatoes dont count.   Go for whole grains - one-quarter of your plate: Whole wheat, barley, wheat berries, quinoa, oats, brown rice, and foods made with them. If you want pasta, go with whole wheat pasta.   Protein power - one-quarter of your plate: Fish, chicken, beans, and nuts are all healthy, versatile protein sources. Limit red meat.   The diet, however, does go beyond the plate, offering a few other suggestions.   Use healthy plant oils, such as olive, canola, soy, corn, sunflower and peanut. Check  the labels, and avoid partially hydrogenated oil, which have unhealthy trans fats.   If youre thirsty, drink water. Coffee and tea are good in moderation, but skip sugary drinks and limit milk and dairy products to one or two daily servings.   The type of carbohydrate in the diet is more important than the amount. Some sources of carbohydrates, such as vegetables, fruits, whole grains, and beans-are healthier than others.   Finally, stay active  Signed, Berniece Salines, DO  01/09/2021 9:39 PM    [facility]

## 2021-01-06 NOTE — Patient Instructions (Signed)
Medication Instructions:  Your physician has recommended you make the following change in your medication:  STOP: Amlodipine START: Coreg 3.125 mg twice daily *If you need a refill on your cardiac medications before your next appointment, please call your pharmacy*   Lab Work: None If you have labs (blood work) drawn today and your tests are completely normal, you will receive your results only by: [place] (if you have MyChart) OR A paper copy in the mail If you have any lab test that is abnormal or we need to change your treatment, we will call you to review the results.   Testing/Procedures: [place]- Long Term Monitor Instructions  Your physician has requested you wear a ZIO patch monitor for 14 days.  This is a single patch monitor. [employer] supplies one patch monitor per enrollment. Additional stickers are not available. Please do not apply patch if you will be having a Nuclear Stress Test,  Echocardiogram, Cardiac CT, MRI, or Chest Xray during the period you would be wearing the  monitor. The patch cannot be worn during these tests. You cannot remove and re-apply the  ZIO XT patch monitor.  Your ZIO patch monitor will be mailed 3 day USPS to your address on file. It may take 3-5 days  to receive your monitor after you have been enrolled.  Once you have received your monitor, please review the enclosed instructions. Your monitor  has already been registered assigning a specific monitor serial # to you.  Billing and Patient Assistance Program Information  We have supplied [employer] with any of your insurance information on file for billing purposes. [employer] offers a sliding scale Patient Assistance Program for patients that do not have  insurance, or whose insurance does not completely cover the cost of the ZIO monitor.  You must apply for the Patient Assistance Program to qualify for this discounted rate.  To apply, please call [employer] at [phone], select option 4,  select option 2, ask to apply for  Patient Assistance Program. Theodore Demark will ask your household income, and how many people  are in your household. They will quote your out-of-pocket cost based on that information.  [employer] will also be able to set up a 69-month, interest-free payment plan if needed.  Applying the monitor   Shave hair from upper left chest.  Hold abrader disc by orange tab. Rub abrader in 40 strokes over the upper left chest as  indicated in your monitor instructions.  Clean area with 4 enclosed alcohol pads. Let dry.  Apply patch as indicated in monitor instructions. Patch will be placed under collarbone on left  side of chest with arrow pointing upward.  Rub patch adhesive wings for 2 minutes. Remove white label marked "1". Remove the white  label marked "2". Rub patch adhesive wings for 2 additional minutes.  While looking in a mirror, press and release button in center of patch. A small green light will  flash 3-4 times. This will be your only indicator that the monitor has been turned on.  Do not shower for the first 24 hours. You may shower after the first 24 hours.  Press the button if you feel a symptom. You will hear a small click. Record Date, Time and  Symptom in the Patient Logbook.  When you are ready to remove the patch, follow instructions on the last 2 pages of Patient  Logbook. Stick patch monitor onto the last page of Patient Logbook.  Place Patient Logbook in the blue and  white box. Use locking tab on box and tape box closed  securely. The blue and white box has prepaid postage on it. Please place it in the mailbox as  soon as possible. Your physician should have your test results approximately 7 days after the  monitor has been mailed back to [place].  Call [place] at [phone] if you have questions regarding  your ZIO XT patch monitor. Call them immediately if you see an orange light blinking on your  monitor.  If your  monitor falls off in less than 4 days, contact our Monitor department at [phone].  If your monitor becomes loose or falls off after 4 days call [employer] at [phone] for  suggestions on securing your monitor    Follow-Up: At [facility], you and your health needs are our priority.  As part of our continuing mission to provide you with exceptional heart care, we have created designated Provider Care Teams.  These Care Teams include your primary Cardiologist (physician) and Advanced Practice Providers (APPs -  Physician Assistants and Nurse Practitioners) who all work together to provide you with the care you need, when you need it.  We recommend signing up for the patient portal called "MyChart".  Sign up information is provided on this After Visit Summary.  MyChart is used to connect with patients for Virtual Visits (Telemedicine).  Patients are able to view lab/test results, encounter notes, upcoming appointments, etc.  Non-urgent messages can be sent to your provider as well.   To learn more about what you can do with MyChart, go to [email or web address].    Your next appointment:   6 month(s)  The format for your next appointment:   In Person  Provider:   Berniece Salines, DO   Other Instructions

## 2021-01-09 DIAGNOSIS — R002 Palpitations: Secondary | ICD-10-CM | POA: Insufficient documentation

## 2021-01-09 DIAGNOSIS — I77810 Thoracic aortic ectasia: Secondary | ICD-10-CM | POA: Insufficient documentation

## 2021-01-09 DIAGNOSIS — I2510 Atherosclerotic heart disease of native coronary artery without angina pectoris: Secondary | ICD-10-CM | POA: Insufficient documentation

## 2021-01-09 DIAGNOSIS — E782 Mixed hyperlipidemia: Secondary | ICD-10-CM | POA: Insufficient documentation

## 2021-01-12 DIAGNOSIS — R002 Palpitations: Secondary | ICD-10-CM

## 2021-01-28 ENCOUNTER — Other Ambulatory Visit: Payer: Self-pay | Admitting: Family Medicine

## 2021-02-02 ENCOUNTER — Other Ambulatory Visit: Payer: Self-pay | Admitting: Cardiology

## 2021-02-03 NOTE — Telephone Encounter (Signed)
Spoke with wife and explained there is no recent lab work to support order for [employer] D refill and patient was not taking it the last time he saw Dr. Harriet Masson. Suggested patient contact PCP for a Vit. D order. Wife stated she would contact PCP.

## 2021-02-07 ENCOUNTER — Encounter (HOSPITAL_COMMUNITY): Payer: Self-pay | Admitting: *Deleted

## 2021-02-07 ENCOUNTER — Emergency Department (HOSPITAL_COMMUNITY): Payer: Self-pay

## 2021-02-07 ENCOUNTER — Observation Stay (HOSPITAL_COMMUNITY)
Admission: EM | Admit: 2021-02-07 | Discharge: 2021-02-08 | Disposition: A | Discharge status: Home / Self Care | Payer: Self-pay | Attending: Family Medicine | Admitting: Family Medicine

## 2021-02-07 ENCOUNTER — Other Ambulatory Visit: Payer: Self-pay

## 2021-02-07 ENCOUNTER — Telehealth: Payer: Self-pay | Admitting: Cardiology

## 2021-02-07 ENCOUNTER — Other Ambulatory Visit: Payer: Self-pay | Admitting: Cardiology

## 2021-02-07 DIAGNOSIS — R0602 Shortness of breath: Secondary | ICD-10-CM

## 2021-02-07 DIAGNOSIS — I471 Supraventricular tachycardia, unspecified: Secondary | ICD-10-CM | POA: Diagnosis present

## 2021-02-07 DIAGNOSIS — F17200 Nicotine dependence, unspecified, uncomplicated: Secondary | ICD-10-CM | POA: Insufficient documentation

## 2021-02-07 DIAGNOSIS — R072 Precordial pain: Principal | ICD-10-CM | POA: Insufficient documentation

## 2021-02-07 DIAGNOSIS — R0781 Pleurodynia: Secondary | ICD-10-CM

## 2021-02-07 DIAGNOSIS — F411 Generalized anxiety disorder: Secondary | ICD-10-CM | POA: Diagnosis present

## 2021-02-07 DIAGNOSIS — R112 Nausea with vomiting, unspecified: Secondary | ICD-10-CM | POA: Insufficient documentation

## 2021-02-07 DIAGNOSIS — Z20822 Contact with and (suspected) exposure to covid-19: Secondary | ICD-10-CM | POA: Insufficient documentation

## 2021-02-07 DIAGNOSIS — Z7982 Long term (current) use of aspirin: Secondary | ICD-10-CM | POA: Insufficient documentation

## 2021-02-07 DIAGNOSIS — J439 Emphysema, unspecified: Secondary | ICD-10-CM | POA: Diagnosis present

## 2021-02-07 DIAGNOSIS — I2510 Atherosclerotic heart disease of native coronary artery without angina pectoris: Secondary | ICD-10-CM | POA: Diagnosis present

## 2021-02-07 DIAGNOSIS — E7849 Other hyperlipidemia: Secondary | ICD-10-CM | POA: Diagnosis present

## 2021-02-07 DIAGNOSIS — Z7951 Long term (current) use of inhaled steroids: Secondary | ICD-10-CM | POA: Insufficient documentation

## 2021-02-07 DIAGNOSIS — R079 Chest pain, unspecified: Secondary | ICD-10-CM | POA: Diagnosis present

## 2021-02-07 LAB — BASIC METABOLIC PANEL
Anion gap: 8 (ref 5–15)
BUN: 10 mg/dL (ref 6–20)
CO2: 26 mmol/L (ref 22–32)
Calcium: 8.7 mg/dL — ABNORMAL LOW (ref 8.9–10.3)
Chloride: 106 mmol/L (ref 98–111)
Creatinine, Ser: 0.89 mg/dL (ref 0.61–1.24)
GFR, Estimated: >60 mL/min (ref >60)
Glucose, Bld: 98 mg/dL (ref 70–99)
Potassium: 4.3 mmol/L (ref 3.5–5.1)
Sodium: 140 mmol/L (ref 135–145)

## 2021-02-07 LAB — RESP PANEL BY RT-PCR (FLU A&B, COVID) ARPGX2
Influenza A by PCR: NEGATIVE
Influenza B by PCR: NEGATIVE
SARS Coronavirus 2 by RT PCR: NEGATIVE

## 2021-02-07 LAB — TROPONIN I (HIGH SENSITIVITY)
Troponin I (High Sensitivity): <2 ng/L (ref <18)
Troponin I (High Sensitivity): 3 ng/L (ref <18)

## 2021-02-07 LAB — CBC
HCT: 46.1 % (ref 39.0–52.0)
Hemoglobin: 15.5 g/dL (ref 13.0–17.0)
MCH: 30.5 pg (ref 26.0–34.0)
MCHC: 33.6 g/dL (ref 30.0–36.0)
MCV: 90.6 fL (ref 80.0–100.0)
Platelets: 209 10*3/uL (ref 150–400)
RBC: 5.09 MIL/uL (ref 4.22–5.81)
RDW: 13.4 % (ref 11.5–15.5)
WBC: 7.9 10*3/uL (ref 4.0–10.5)
nRBC: 0 % (ref 0.0–0.2)

## 2021-02-07 MED ORDER — ASPIRIN EC 81 MG PO TBEC
162.0000 mg | DELAYED_RELEASE_TABLET | Freq: Every day | ORAL | Status: DC
  Administered 2021-02-07: 162 mg via ORAL
  Filled 2021-02-07: qty 2

## 2021-02-07 MED ORDER — METHYLPREDNISOLONE SODIUM SUCC 125 MG IJ SOLR
125.0000 mg | Freq: Once | INTRAMUSCULAR | 0 refills | Status: DC
Start: 2021-02-07 — End: 2021-02-08

## 2021-02-07 MED ORDER — ACETAMINOPHEN 325 MG PO TABS: 650.0000 mg | ORAL_TABLET | ORAL | Status: DC | PRN

## 2021-02-07 MED ORDER — PREDNISONE 50 MG PO TABS
50.0000 mg | ORAL_TABLET | Freq: Every day | ORAL | Status: DC
  Administered 2021-02-07 – 2021-02-08 (×2): 50 mg via ORAL
  Filled 2021-02-07: qty 3
  Filled 2021-02-07: qty 1

## 2021-02-07 MED ORDER — ENOXAPARIN SODIUM 40 MG/0.4ML IJ SOSY
40.0000 mg | PREFILLED_SYRINGE | INTRAMUSCULAR | Status: DC
  Administered 2021-02-07: 40 mg via SUBCUTANEOUS
  Filled 2021-02-07: qty 0.4

## 2021-02-07 MED ORDER — ALPRAZOLAM 0.5 MG PO TABS
2.0000 mg | ORAL_TABLET | Freq: Every day | ORAL | Status: DC
  Administered 2021-02-07: 2 mg via ORAL
  Filled 2021-02-07: qty 8

## 2021-02-07 MED ORDER — TETRAHYDROZOLINE HCL 0.05 % OP SOLN: 1.0000 [drp] | OPHTHALMIC | Status: DC | PRN

## 2021-02-07 MED ORDER — IPRATROPIUM-ALBUTEROL 0.5-2.5 (3) MG/3ML IN SOLN: 3.0000 mL | RESPIRATORY_TRACT | Status: DC | PRN

## 2021-02-07 MED ORDER — ALUM & MAG HYDROXIDE-SIMETH 200-200-20 MG/5ML PO SUSP: 30.0000 mL | ORAL | Status: DC | PRN

## 2021-02-07 MED ORDER — ALPRAZOLAM 0.25 MG PO TABS: 1.0000 mg | ORAL_TABLET | Freq: Three times a day (TID) | ORAL | Status: DC

## 2021-02-07 MED ORDER — PANTOPRAZOLE SODIUM 40 MG PO TBEC
40.0000 mg | DELAYED_RELEASE_TABLET | Freq: Every day | ORAL | Status: DC
  Administered 2021-02-08: 40 mg via ORAL
  Filled 2021-02-07: qty 1

## 2021-02-07 MED ORDER — ONDANSETRON HCL 4 MG/2ML IJ SOLN: 4.0000 mg | Freq: Four times a day (QID) | INTRAMUSCULAR | Status: DC | PRN

## 2021-02-07 MED ORDER — IOHEXOL 350 MG/ML SOLN
100.0000 mL | Freq: Once | INTRAVENOUS | Status: AC | PRN
  Administered 2021-02-07: 100 mL via INTRAVENOUS

## 2021-02-07 MED ORDER — ROSUVASTATIN CALCIUM 5 MG PO TABS
10.0000 mg | ORAL_TABLET | Freq: Every day | ORAL | Status: DC
  Administered 2021-02-07: 10 mg via ORAL
  Filled 2021-02-07: qty 2

## 2021-02-07 MED ORDER — ALPRAZOLAM 0.5 MG PO TABS
1.0000 mg | ORAL_TABLET | Freq: Every morning | ORAL | Status: DC
  Administered 2021-02-08: 1 mg via ORAL
  Filled 2021-02-07 (×2): qty 4

## 2021-02-07 MED ORDER — IPRATROPIUM-ALBUTEROL 0.5-2.5 (3) MG/3ML IN SOLN: 3.0000 mL | Freq: Once | RESPIRATORY_TRACT | 0 refills | Status: DC

## 2021-02-07 MED ORDER — POLYVINYL ALCOHOL 1.4 % OP SOLN: 1.0000 [drp] | OPHTHALMIC | Status: DC | PRN

## 2021-02-07 NOTE — Telephone Encounter (Signed)
Patient's wife is returning Jasmine's call from 02/05/21.

## 2021-02-07 NOTE — ED Triage Notes (Signed)
Pt has bleeding disorder and extensive history/family history. Has woken up two days with n/v and sob. Reports mid chest pain that increases with respiration. Airway intact at triage.

## 2021-02-07 NOTE — H&P (Signed)
History and Physical    Stuart Hill DZH:[id number] DOB: 03-28-1970 DOA: 02/07/2021  PCP: Darreld Mclean, MD   Patient coming from: Home   Chief Complaint: Chest pain, SOB   HPI: Stuart Hill is a pleasant 51 y.o. male with medical history significant for polycythemia vera, anxiety, hyperlipidemia, paroxysmal SVT, and minimal CAD on coronary CT, now presenting with chest pain and shortness of breath.  Patient reports experiencing central chest pain when breathing that began upon waking this morning and has been associated with exertional dyspnea and a discomfort in his throat.  He has never had this type of pain before.  Has had mild increase in his chronic cough recently with occasional clear sputum production.  Denies ankle swelling, fever, or chills.  He had 1 episode of nausea with nonbloody vomiting today and felt sweaty at one point.  ED Course: Upon arrival to the ED, patient is found to be afebrile, saturating well on room air, bradycardic to the mid 41D, and with systolic blood pressure 622.  EKG features sinus bradycardia with rate 55.  Chest x-ray negative for acute cardiopulmonary disease.  CTA chest/abdomen/pelvis negative for acute findings.  Chemistry panel and CBC unremarkable and troponin was normal x2.  Cardiology was consulted by the ED physician and hospitalists asked to admit.  Review of Systems:  All other systems reviewed and apart from HPI, are negative.  Past Medical History:  Diagnosis Date   Allergy    Anxiety    Arthritis    Bell's palsy    Erythrocytosis 04/10/2015   Hyperlipidemia    Polycythemia vera ([place]) 05/03/2015    Past Surgical History:  Procedure Laterality Date   DENTAL SURGERY     HAND SURGERY     TONSILLECTOMY Bilateral 04/30/2017   Procedure: TONSILLECTOMY;  Surgeon: Jerrell Belfast, MD;  Location: [facility];  Service: ENT;  Laterality: Bilateral;    Social History:   reports that he has been smoking cigarettes. He  has a 6.00 pack-year smoking history. He has never used smokeless tobacco. He reports current alcohol use of about 1.0 standard drink per week. He reports current drug use. Drugs: Other-see comments and Marijuana.  Allergies  Allergen Reactions   Plavix [Clopidogrel Bisulfate] Other (See Comments)    Confusion, lethargy, slurred speech   Afrin [Nasal Spray] Hives   Antihistamines, Chlorpheniramine-Type Hives   Benadryl [Diphenhydramine Hcl] Hives   Buprenorphine Hcl Hives   Carisoprodol Hives   Codeine Hives   Metoclopramide Hives   Morphine And Related Hives   Oxymetazoline Hives   Pheniramine Hives and Rash    Family History  Problem Relation Age of Onset   COPD Mother    Fibromyalgia Mother    Heart disease Father    Stroke Father    Depression Brother    Heart disease Brother    Heart disease Maternal Grandmother    Stroke Maternal Grandmother    Heart disease Maternal Grandfather    Stroke Maternal Grandfather    Heart disease Paternal Grandmother    Stroke Paternal Grandmother    Heart disease Paternal Grandfather    Stroke Paternal Grandfather    Stroke Paternal Aunt      Prior to Admission medications   Medication Sig Start Date End Date Taking? Authorizing Provider  albuterol (VENTOLIN HFA) 108 (90 Base) MCG/ACT inhaler Inhale 1-2 puffs into the lungs every 6 (six) hours as needed for wheezing or shortness of breath. 04/03/20  Yes Copland, Gay Filler, MD  ALPRAZolam (XANAX) 1 MG tablet TAKE 1 TABLET BY MOUTH THREE TIMES A DAY AS NEEDED FOR ANXIETY Patient taking differently: Take 1 mg by mouth 3 (three) times daily. Take 1 (1 mg) tablet in morning and 2 (2 mg) tablets at bedtime. 01/01/21  Yes Copland, Gay Filler, MD  aspirin EC 81 MG tablet Take 162 mg by mouth at bedtime. Swallow whole.   Yes [provider]  carvedilol (COREG) 3.125 MG tablet Take 1 tablet (3.125 mg total) by mouth 2 (two) times daily. 01/06/21 04/06/21 Yes Tobb, Kardie, DO  Esomeprazole  Magnesium (NEXIUM PO) Take 1 tablet by mouth as needed (acid reflux).   Yes [provider]  ipratropium-albuterol (DUONEB) 0.5-2.5 (3) MG/3ML SOLN Take 3 mLs by nebulization once for 1 dose. 02/07/21 02/07/21 Yes Cox, Amalia Hailey, MD  methylPREDNISolone sodium succinate (SOLU-MEDROL) 125 mg/2 mL injection Inject 2 mLs (125 mg total) into the vein once for 1 dose. 02/07/21 02/07/21 Yes Cox, Amalia Hailey, MD  nitroGLYCERIN (NITROSTAT) 0.4 MG SL tablet Place 1 tablet (0.4 mg total) under the tongue every 5 (five) minutes as needed for chest pain. 11/08/19  Yes Chandrasekhar, Mahesh A, MD  rosuvastatin (CRESTOR) 10 MG tablet TAKE 1 TABLET BY MOUTH ONCE DAILY AT BEDTIME Patient taking differently: Take 10 mg by mouth at bedtime. 01/28/21  Yes Copland, Gay Filler, MD  sildenafil (REVATIO) 20 MG tablet TAKE 1-3 TABLETS BY MOUTH PRIOR TO SEXUAL ACTIVITY AS NEEDED Patient taking differently: Take 20 mg by mouth as needed (ed). 01/11/20  Yes Copland, Gay Filler, MD  Tetrahydrozoline HCl (VISINE OP) Apply 1 drop to eye as needed (itchy, dryness).   Yes [provider]  tiotropium (SPIRIVA HANDIHALER) 18 MCG inhalation capsule Place 1 capsule (18 mcg total) into inhaler and inhale daily. Patient not taking: Reported on 02/07/2021 05/31/20   Copland, Gay Filler, MD  traMADol (ULTRAM) 50 MG tablet TAKE 1 BY MOUTH DAILY AS NEEDED FOR BACK PAIN Patient not taking: Reported on 10/23/2020 01/31/20   Copland, Gay Filler, MD  Vitamin D, Ergocalciferol, (DRISDOL) 1.25 MG (50000 UNIT) CAPS capsule Take 1 capsule (50,000 Units total) by mouth every 7 (seven) days. Patient not taking: Reported on 01/06/2021 09/02/20   Berniece Salines, DO    Physical Exam: Vitals:   02/07/21 1900 02/07/21 1915 02/07/21 1930 02/07/21 1945  BP: 122/80 112/79 130/87 131/74  Pulse: (!) 44 (!) 44 (!) 42 (!) 42  Resp: 13 19 17    Temp:      TempSrc:      SpO2: 99% 98% 100% 100%    Constitutional: NAD, calm  Eyes: PERTLA, lids and  conjunctivae normal ENMT: Mucous membranes are moist. Posterior pharynx clear of any exudate or lesions.   Neck: supple, no masses  Respiratory: no wheezing, no crackles. No accessory muscle use.  Cardiovascular: S1 & S2 heard, regular rate and rhythm. No extremity edema.   Abdomen: No distension, no tenderness, soft. Bowel sounds active.  Musculoskeletal: no clubbing / cyanosis. No joint deformity upper and lower extremities.   Skin: no significant rashes, lesions, ulcers. Warm, dry, well-perfused. Neurologic: CN 2-12 grossly intact. Moving all extremities. Alert and oriented.  Psychiatric: Pleasant. Cooperative.    Labs and Imaging on Admission: I have personally reviewed following labs and imaging studies  CBC: Recent Labs  Lab 02/07/21 1232  WBC 7.9  HGB 15.5  HCT 46.1  MCV 90.6  PLT 419   Basic Metabolic Panel: Recent Labs  Lab 02/07/21 1232  NA 140  K 4.3  CL 106  CO2 26  GLUCOSE 98  BUN 10  CREATININE 0.89  CALCIUM 8.7*   GFR: CrCl cannot be calculated (Unknown ideal weight.). Liver Function Tests: No results for input(s): AST, ALT, ALKPHOS, BILITOT, PROT, ALBUMIN in the last 168 hours. No results for input(s): LIPASE, AMYLASE in the last 168 hours. No results for input(s): AMMONIA in the last 168 hours. Coagulation Profile: No results for input(s): INR, PROTIME in the last 168 hours. Cardiac Enzymes: No results for input(s): CKTOTAL, CKMB, CKMBINDEX, TROPONINI in the last 168 hours. BNP (last 3 results) No results for input(s): PROBNP in the last 8760 hours. HbA1C: No results for input(s): HGBA1C in the last 72 hours. CBG: No results for input(s): GLUCAP in the last 168 hours. Lipid Profile: No results for input(s): CHOL, HDL, LDLCALC, TRIG, CHOLHDL, LDLDIRECT in the last 72 hours. Thyroid Function Tests: No results for input(s): TSH, T4TOTAL, FREET4, T3FREE, THYROIDAB in the last 72 hours. Anemia Panel: No results for input(s): VITAMINB12, FOLATE,  FERRITIN, TIBC, IRON, RETICCTPCT in the last 72 hours. Urine analysis:    Component Value Date/Time   COLORURINE YELLOW 11/22/2020 1031   APPEARANCEUR CLEAR 11/22/2020 1031   LABSPEC 1.014 11/22/2020 1031   LABSPEC 1.015 03/04/2016 1517   PHURINE 6.0 11/22/2020 1031   GLUCOSEU NEGATIVE 11/22/2020 1031   HGBUR NEGATIVE 11/22/2020 1031   [place] 11/22/2020 1031   BILIRUBINUR negative 04/03/2020 1605   BILIRUBINUR neg 01/11/2014 1434   KETONESUR NEGATIVE 11/22/2020 1031   PROTEINUR NEGATIVE 11/22/2020 1031   UROBILINOGEN 0.2 04/03/2020 1605   UROBILINOGEN 0.2 03/04/2016 1517   NITRITE NEGATIVE 11/22/2020 1031   LEUKOCYTESUR NEGATIVE 11/22/2020 1031   Sepsis Labs: @LABRCNTIP (procalcitonin:4,lacticidven:4) ) Recent Results (from the past 240 hour(s))  Resp Panel by RT-PCR (Flu A&B, Covid) Nasopharyngeal Swab     Status: None   Collection Time: 02/07/21  7:24 PM   Specimen: Nasopharyngeal Swab; Nasopharyngeal(NP) swabs in vial transport medium  Result Value Ref Range Status   SARS Coronavirus 2 by RT PCR NEGATIVE NEGATIVE Final    Comment: (NOTE) SARS-CoV-2 target nucleic acids are NOT DETECTED.  The SARS-CoV-2 RNA is generally detectable in upper respiratory specimens during the acute phase of infection. The lowest concentration of SARS-CoV-2 viral copies this assay can detect is 138 copies/mL. A negative result does not preclude SARS-Cov-2 infection and should not be used as the sole basis for treatment or other patient management decisions. A negative result may occur with  improper specimen collection/handling, submission of specimen other than nasopharyngeal swab, presence of viral mutation(s) within the areas targeted by this assay, and inadequate number of viral copies(<138 copies/mL). A negative result must be combined with clinical observations, patient history, and epidemiological information. The expected result is Negative.  Fact Sheet for Patients:   EntrepreneurPulse.com.au  Fact Sheet for Healthcare Providers:  IncredibleEmployment.be  This test is no t yet approved or cleared by the [place] FDA and  has been authorized for detection and/or diagnosis of SARS-CoV-2 by FDA under an Emergency Use Authorization (EUA). This EUA will remain  in effect (meaning this test can be used) for the duration of the COVID-19 declaration under Section 564(b)(1) of the Act, 21 U.S.C.section 360bbb-3(b)(1), unless the authorization is terminated  or revoked sooner.       Influenza A by PCR NEGATIVE NEGATIVE Final   Influenza B by PCR NEGATIVE NEGATIVE Final    Comment: (NOTE) The Xpert Xpress SARS-CoV-2/FLU/RSV plus assay is intended as an  aid in the diagnosis of influenza from Nasopharyngeal swab specimens and should not be used as a sole basis for treatment. Nasal washings and aspirates are unacceptable for Xpert Xpress SARS-CoV-2/FLU/RSV testing.  Fact Sheet for Patients: EntrepreneurPulse.com.au  Fact Sheet for Healthcare Providers: IncredibleEmployment.be  This test is not yet approved or cleared by the [place] FDA and has been authorized for detection and/or diagnosis of SARS-CoV-2 by FDA under an Emergency Use Authorization (EUA). This EUA will remain in effect (meaning this test can be used) for the duration of the COVID-19 declaration under Section 564(b)(1) of the Act, 21 U.S.C. section 360bbb-3(b)(1), unless the authorization is terminated or revoked.  Performed at [facility], [place] [address], [place], [place] [address]      Radiological Exams on Admission: DG Chest 2 View  Result Date: 02/07/2021 CLINICAL DATA:  Chest pain, shortness of breath EXAM: CHEST - 2 VIEW COMPARISON:  11/22/2020 FINDINGS: The heart size and mediastinal contours are within normal limits. Both lungs are clear. The visualized skeletal structures are  unremarkable. IMPRESSION: No active cardiopulmonary disease. Electronically Signed   By: Elmer Picker M.D.   On: 02/07/2021 12:58   CT Angio Chest/Abd/Pel for Dissection W and/or W/WO  Result Date: 02/07/2021 CLINICAL DATA:  Chest and back pain since this morning. Aortic dissection suspected. EXAM: CT ANGIOGRAPHY CHEST, ABDOMEN AND PELVIS TECHNIQUE: Non-contrast CT of the chest was initially obtained. Multidetector CT imaging through the chest, abdomen and pelvis was performed using the standard protocol during bolus administration of intravenous contrast. Multiplanar reconstructed images and MIPs were obtained and reviewed to evaluate the vascular anatomy. RADIATION DOSE REDUCTION: This exam was performed according to the departmental dose-optimization program which includes automated exposure control, adjustment of the mA and/or kV according to patient size and/or use of iterative reconstruction technique. CONTRAST:  147mL OMNIPAQUE IOHEXOL 350 MG/ML SOLN COMPARISON:  Chest CT 04/05/2020. Noncontrast abdominopelvic CT 04/27/2015. FINDINGS: CTA CHEST FINDINGS Cardiovascular: No acute vascular findings are identified. Specifically, no evidence of aortic dissection or aneurysm. The pulmonary arteries are well opacified with contrast. No evidence of acute pulmonary embolism. Minimal coronary artery atherosclerosis noted. The heart size is normal. There is no pericardial effusion. Mediastinum/Nodes: There are no enlarged mediastinal, hilar or axillary lymph nodes.Stable soft tissue density in the pre-vascular space attributed to residual thymic tissue. The thyroid gland, trachea and esophagus demonstrate no significant findings. Lungs/Pleura: No pleural effusion or pneumothorax. Stable mild paraseptal emphysema and noncalcified 3 mm left lower lobe pulmonary nodule on image 100/7, consistent with a benign finding. Upper abdomen:  Unremarkable.  There is no adrenal mass. Musculoskeletal/Chest wall: There is  no chest wall mass or suspicious osseous finding. Review of the MIP images confirms the above findings. CTA ABDOMEN AND PELVIS FINDINGS VASCULAR Aorta: Normal caliber aorta without aneurysm, dissection, vasculitis or significant stenosis. Minimal atherosclerosis. Celiac: Patent without evidence of aneurysm, dissection, vasculitis or significant stenosis. SMA: Patent without evidence of aneurysm, dissection, vasculitis or significant stenosis. Renals: Both renal arteries are patent without evidence of aneurysm, dissection, vasculitis, fibromuscular dysplasia or significant stenosis. Small accessory renal arteries bilaterally. IMA: Patent without evidence of aneurysm, dissection, vasculitis or significant stenosis. Inflow: Patent without evidence of aneurysm, dissection, vasculitis or significant stenosis. Veins: Not opacified with contrast.  Grossly unremarkable. Review of the MIP images confirms the above findings. NON-VASCULAR Hepatobiliary: No focal hepatic abnormalities identified as imaged in the arterial phase. The gallbladder is contracted. No evidence of gallstones or gallbladder wall thickening. No biliary dilatation.  Pancreas: Unremarkable. No pancreatic ductal dilatation or surrounding inflammatory changes. Spleen: Normal in size without focal abnormality. Adrenals/Urinary Tract: Adrenal glands are unremarkable. Kidneys are normal, without renal calculi, focal lesion, or hydronephrosis. Bladder is unremarkable. Stomach/Bowel: No enteric contrast administered. The stomach appears unremarkable for its degree of distention. No evidence of bowel wall thickening, distention or surrounding inflammation. The appendix appears normal. Lymphatic: No enlarged abdominopelvic lymph nodes. Reproductive: The prostate gland and seminal vesicles appear unremarkable. Other: Stable small umbilical hernia containing only fat. No ascites or free air. Musculoskeletal: No acute or significant osseous findings. Mild spondylosis.  Review of the MIP images confirms the above findings. IMPRESSION: 1. No evidence of aortic dissection or other acute vascular findings in the chest, abdomen or pelvis. 2. Minimal abdominal aortic atherosclerosis. 3. Mild paraseptal Emphysema (ICD10-J43.9). Electronically Signed   By: Richardean Sale M.D.   On: 02/07/2021 14:35    EKG: Independently reviewed. Sinus bradycardia, rate 55.   Assessment/Plan   1. Chest pain, SOB  - Pt with minimal CAD p/w pleuritic chest pain, DOE, and throat tightness  - No acute ischemic changes on EKG, serial troponin normal, and no acute findings on CTA c/a/p in ED  - He has hx of anxiety, GERD, and COPD which could all be contributing to his sxs - Appreciate cardiology consultation, plan to continue cardiac monitoring, update echocardiogram, treat his GERD, anxiety, and COPD   2. COPD  - Recent increase cough and SOB, no change in sputum or significant wheeze though mild exacerbation in COPD may be contributing to his presentation and he will be treated with steroids and breathing treatments    3. Paroxysmal SVT  - HR in 40s in ED, stop Coreg per cardiology  4. Anxiety  - Continue Xanax     DVT prophylaxis: Lovenox  Code Status: Full  Level of Care: Level of care: Telemetry Cardiac Family Communication: wife at bedside  Disposition Plan:  Patient is from: home  Anticipated d/c is to: Home  Anticipated d/c date is: 1/14 or 02/09/21  Patient currently: pending cardiac monitoring, echocardiogram, response to breathing treatments and steroids  Consults called: cardiology  Admission status: Observation     Vianne Bulls, MD [facility]  02/07/2021, 8:39 PM

## 2021-02-07 NOTE — ED Notes (Signed)
Mother Korey Prashad [phone] would like an update

## 2021-02-07 NOTE — ED Provider Triage Note (Signed)
Emergency Medicine Provider Triage Evaluation Note  ALFONSE GARRINGER , a 51 y.o. male  was evaluated in triage.  Pt complains of CP constant since this morning at 6:45 am when he woke up. States worse when he walked to his truck.  +SOB  Hx of aortic root dilation he states cards is monitoring  Review of Systems  Positive: CP Negative: Fever   Physical Exam  BP (!) 107/95 (BP Location: Right Arm)    Pulse (!) 52    Temp 98.4 F (36.9 C) (Oral)    Resp 16    SpO2 100%  Gen:   Awake, no distress   Resp:  Normal effort  MSK:   Moves extremities without difficulty  Other:  BL 3+ symmetric pulses  Medical Decision Making  Medically screening exam initiated at 12:24 PM.  Appropriate orders placed.  EVEREST BROD was informed that the remainder of the evaluation will be completed by another provider, this initial triage assessment does not replace that evaluation, and the importance of remaining in the ED until their evaluation is complete.  Labs and CT ordered.    Pati Gallo [place], [place] 02/07/21 1229

## 2021-02-07 NOTE — Telephone Encounter (Signed)
Orvan July, RN  02/05/2021  4:24 PM EST     Patient called.  Left message for patient to call back.   Berniece Salines, DO  02/05/2021  2:16 PM EST     Your monitor shows that your heart rate goes up to 160.  This episodes were very short.  If he still having the palpitations.  Have you had any improvement on the carvedilol?   Spoke with patient's wife about test results.   She reports patient took NTG today for chest pain/pressure/throat. He took a NTG about 10 mins before this call. She reports EMS assessed patient last week - BP 82/68 after NTG  Wife states patient is loopy on the carvedilol, exhausted. He takes about once daily.  Advised patient go to ED for eval, given NTG use, chest pain, hypotension after 1 NTG dose. Wife states she would call EMS for eval/transport.   Routed to Dr. Harriet Masson as Juluis Rainier

## 2021-02-07 NOTE — ED Notes (Signed)
Cardiology at bedside.

## 2021-02-07 NOTE — Consult Note (Signed)
Cardiology Consultation:   Patient ID: Stuart Hill MRN: [id number]; DOB: 24-Feb-1970  Admit date: 02/07/2021 Date of Consult: 02/07/2021  PCP:  Darreld Mclean, MD   [facility] Providers Cardiologist:  Berniece Salines, DO        Patient Profile:   Stuart Hill is a 51 y.o. male with a hx of minimal coronary artery disease seen on coronary CTA, hyperlipidemia, polycythemia vera, dilatation of the proximal ascending aorta who is being seen 02/07/2021 for the evaluation of pleuritic chest discomfort and shortness of breath at the request of Dr. Arnetha Courser.  History of Present Illness:   Stuart Hill tells me that he has been experiencing intermittent shortness of breath on exertion over the last several weeks.  He notes reports that he had some vomiting today and then experienced some midsternal chest discomfort.  He also notes that the pain like a heaviness sensation but he felt radiation to his throat he felt his throat was closing off.  During that time he also notes that he was experiencing some shortness of breath.  He notes that he took nitroglycerin and still did have the shortness of breath and some throat pain.  During that time he had received a call from my office to talk to him about his monitor results and he was asked to go to the emergency department.  He presented to the emergency department and work-up was started.  So far his troponin has been negative x2 and he had a CTA dissection work-up which was negative.  At bedside the patient still reports that he is experiencing some shortness of breath and notes that it hurts also when he takes deep breaths.  Of note he also tells me recently he has been feeling fatigue and tired since starting the beta-blocker he had cut back on his Coreg which was prescribed at 3.125 mg twice daily to only once at night.  His wife is by the bedside.   Past Medical History:  Diagnosis Date   Allergy    Anxiety    Arthritis     Bell's palsy    Erythrocytosis 04/10/2015   Hyperlipidemia    Polycythemia vera ([place]) 05/03/2015    Past Surgical History:  Procedure Laterality Date   DENTAL SURGERY     HAND SURGERY     TONSILLECTOMY Bilateral 04/30/2017   Procedure: TONSILLECTOMY;  Surgeon: Jerrell Belfast, MD;  Location: [place];  Service: ENT;  Laterality: Bilateral;       Inpatient Medications: Scheduled Meds:  Continuous Infusions:  PRN Meds:   Allergies:    Allergies  Allergen Reactions   Plavix [Clopidogrel Bisulfate] Other (See Comments)    Confusion, lethargy, slurred speech   Afrin [Nasal Spray] Hives   Antihistamines, Chlorpheniramine-Type Hives   Benadryl [Diphenhydramine Hcl] Hives   Buprenorphine Hcl Hives   Carisoprodol Hives   Codeine Hives   Metoclopramide Hives   Morphine And Related Hives   Oxymetazoline Hives   Pheniramine Hives and Rash    Social History:   Social History   Socioeconomic History   Marital status: Married    Spouse name: Not on file   Number of children: Not on file   Years of education: Not on file   Highest education level: Not on file  Occupational History   Not on file  Tobacco Use   Smoking status: Every Day    Packs/day: 0.25    Years: 24.00    Pack years: 6.00  Types: Cigarettes   Smokeless tobacco: Never  Vaping Use   Vaping Use: Never used  Substance and Sexual Activity   Alcohol use: Yes    Alcohol/week: 1.0 standard drink    Types: 1 Cans of beer per week    Comment: 6-10 beer a week   Drug use: Yes    Types: Other-see comments, Marijuana    Comment: Marijuana every other day.   Sexual activity: Yes  Other Topics Concern   Not on file  Social History Narrative   Not on file   Social Determinants of Health   Financial Resource Strain: Not on file  Food Insecurity: Not on file  Transportation Needs: Not on file  Physical Activity: Not on file  Stress: Not on file  Social Connections: Not on file  Intimate  Partner Violence: Not on file    Family History:    Family History  Problem Relation Age of Onset   COPD Mother    Fibromyalgia Mother    Heart disease Father    Stroke Father    Depression Brother    Heart disease Brother    Heart disease Maternal Grandmother    Stroke Maternal Grandmother    Heart disease Maternal Grandfather    Stroke Maternal Grandfather    Heart disease Paternal Grandmother    Stroke Paternal Grandmother    Heart disease Paternal Grandfather    Stroke Paternal Grandfather    Stroke Paternal Aunt      ROS:  Review of Systems  Constitution: Negative for decreased appetite, fever and weight gain.  HENT: Negative for congestion, ear discharge, hoarse voice and sore throat.   Eyes: Negative for discharge, redness, vision loss in right eye and visual halos.  Cardiovascular: Reports pleuritic chest pain with dyspnea,leg swelling, orthopnea and palpitations.  Respiratory: Negative for cough, hemoptysis, shortness of breath and snoring.   Endocrine: Negative for heat intolerance and polyphagia.  Hematologic/Lymphatic: Negative for bleeding problem. Does not bruise/bleed easily.  Skin: Negative for flushing, nail changes, rash and suspicious lesions.  Musculoskeletal: Negative for arthritis, joint pain, muscle cramps, myalgias, neck pain and stiffness.  Gastrointestinal: Negative for abdominal pain, bowel incontinence, diarrhea and excessive appetite.  Genitourinary: Negative for decreased libido, genital sores and incomplete emptying.  Neurological: Negative for brief paralysis, focal weakness, headaches and loss of balance.  Psychiatric/Behavioral: Negative for altered mental status, depression and suicidal ideas.  Allergic/Immunologic: Negative for HIV exposure and persistent infections.    Physical Exam/Data:   Vitals:   02/07/21 1652 02/07/21 1753 02/07/21 1800 02/07/21 1830  BP: 116/80 118/80 (!) 110/99 123/73  Pulse: (!) 51 (!) 49 (!) 44 (!) 46  Resp:  16 16 19 18   Temp:      TempSrc:      SpO2: 100% 100% 99% 98%   No intake or output data in the 24 hours ending 02/07/21 1911 Last 3 Weights 01/06/2021 12/11/2020 10/23/2020  Weight (lbs) 183 lb 9.6 oz 186 lb 12.8 oz 186 lb 1.9 oz  Weight (kg) 83.28 kg 84.732 kg 84.423 kg     There is no height or weight on file to calculate BMI.  General:  Well nourished, well developed, in no acute distress HEENT: normal Neck: no JVD Vascular: No carotid bruits; Distal pulses 2+ bilaterally Cardiac:  normal S1, S2; RRR; no murmur  Lungs:  clear to auscultation bilaterally, no wheezing, rhonchi or rales  Abd: soft, nontender, no hepatomegaly  Ext: no edema Musculoskeletal:  No deformities, BUE and BLE  strength normal and equal Skin: warm and dry, with tattoos Neuro:  CNs 2-12 intact, no focal abnormalities noted Psych:  Normal affect   EKG:  The EKG was personally reviewed and demonstrates: Sinus bradycardia, heart rate 55 bpm compared to prior EKG patient 90 with sinus bradycardia.  Telemetry:  Telemetry was personally reviewed and demonstrates: Sinus bradycardia  Relevant CV Studies:  02/08/2020 Chest CTA   CTA ABDOMEN AND PELVIS FINDINGS   VASCULAR   Aorta: Normal caliber aorta without aneurysm, dissection, vasculitis or significant stenosis. Minimal atherosclerosis.   Celiac: Patent without evidence of aneurysm, dissection, vasculitis or significant stenosis.   SMA: Patent without evidence of aneurysm, dissection, vasculitis or significant stenosis.   Renals: Both renal arteries are patent without evidence of aneurysm, dissection, vasculitis, fibromuscular dysplasia or significant stenosis. Small accessory renal arteries bilaterally.   IMA: Patent without evidence of aneurysm, dissection, vasculitis or significant stenosis.   Inflow: Patent without evidence of aneurysm, dissection, vasculitis or significant stenosis.   Veins: Not opacified with contrast.  Grossly  unremarkable.   Review of the MIP images confirms the above findings.   NON-VASCULAR   Hepatobiliary: No focal hepatic abnormalities identified as imaged in the arterial phase. The gallbladder is contracted. No evidence of gallstones or gallbladder wall thickening. No biliary dilatation.   Pancreas: Unremarkable. No pancreatic ductal dilatation or surrounding inflammatory changes.   Spleen: Normal in size without focal abnormality.   Adrenals/Urinary Tract: Adrenal glands are unremarkable. Kidneys are normal, without renal calculi, focal lesion, or hydronephrosis. Bladder is unremarkable.   Stomach/Bowel: No enteric contrast administered. The stomach appears unremarkable for its degree of distention. No evidence of bowel wall thickening, distention or surrounding inflammation. The appendix appears normal.   Lymphatic: No enlarged abdominopelvic lymph nodes.   Reproductive: The prostate gland and seminal vesicles appear unremarkable.   Other: Stable small umbilical hernia containing only fat. No ascites or free air.   Musculoskeletal: No acute or significant osseous findings. Mild spondylosis.   Review of the MIP images confirms the above findings.   IMPRESSION: 1. No evidence of aortic dissection or other acute vascular findings in the chest, abdomen or pelvis. 2. Minimal abdominal aortic atherosclerosis. 3. Mild paraseptal Emphysema (ICD10-J43.9).     Electronically Signed   By: Richardean Sale M.D.   On: 02/07/2021 14:35     01/31/2021 [employer]   Patch Wear Time:  14 days and 0 hours starting January 12, 2021. Indication: Palpitation.   Patient had a min HR of 37 bpm, max HR of 169 bpm, and avg HR of 64 bpm. Predominant underlying rhythm was Sinus Rhythm. Slight P wave morphology changes were noted. 3 Supraventricular Tachycardia runs occurred, the run with the fastest interval lasting  4 beats with a max rate of 160 bpm, the longest lasting 11 beats with an avg rate  of 97 bpm. Some episodes of Supraventricular Tachycardia may be possible Atrial Tachycardia with variable block. Isolated SVEs were rare (<1.0%), SVE Couplets were rare  (<1.0%), and no SVE Triplets were present. Isolated VEs were rare (<1.0%), and no VE Couplets or VE Triplets were present.     Symptoms associated with sinus rhythm.   Conclusion: Studies remarkable for rare short runs of paroxysmal supraventricular tachycardia.  CCTA 09/06/2020 mage quality: Good.   Noise artifact is: Limited.   Coronary calcium score is 24, which places the patient in the 80th percentile for age and sex matched control.   Coronary arteries: Normal coronary origins.  Right dominance.  Right Coronary Artery: No detectable plaque or stenosis.   Left Main Coronary Artery: No detectable plaque or stenosis.   Left Anterior Descending Coronary Artery: Minimal mixed atherosclerotic plaque in the mid LAD, <25% stenosis   Ramus intermedius: No detectable plaque or stenosis.   Left Circumflex Artery: No detectable plaque or stenosis.   Aorta:   Measurements made in double oblique technique:   Sinus of Valsalva:   R-L: 43 mm   L-Non: 43 mm   R-Non: 43 mm   Mid ascending aorta (at PA bifurcation): 30 mm   Aortic Valve: No calcifications.   Other findings:   Normal pulmonary vein drainage into the left atrium.   Normal left atrial appendage without a thrombus.   Borderline dilation of main pulmonary artery.   IMPRESSION: 1. Minimal CAD in mid LAD, CADRADS = 1.   2. Coronary calcium score is 24, which places the patient in the 80th percentile for age and sex matched control.   3. Normal coronary origin with right dominance.   4. Mild ascending aorta dilation, 43 mm at sinus of Valsalva.     TTE 09/02/2020 IMPRESSIONS  1. Left ventricular ejection fraction, by estimation, is 55%. The left  ventricle has normal function. The left ventricle has no regional wall  motion abnormalities.  Left ventricular diastolic parameters were normal.   2. Right ventricular systolic function is normal. The right ventricular  size is normal. Tricuspid regurgitation signal is inadequate for assessing  PA pressure.   3. The mitral valve is normal in structure. Trivial mitral valve  regurgitation. No evidence of mitral stenosis.   4. The aortic valve is grossly normal. Unable to determine aortic valve  morphology due to image quality. Aortic valve regurgitation is not  visualized. No aortic stenosis is present.   5. Aortic dilatation noted. There is mild dilatation of the aortic root,  measuring 41 mm.   6. The inferior vena cava is normal in size with greater than 50%  respiratory variability, suggesting right atrial pressure of 3 mmHg.   FINDINGS   Left Ventricle: Left ventricular ejection fraction, by estimation, is  55%. The left ventricle has normal function. The left ventricle has no  regional wall motion abnormalities. The left ventricular internal cavity  size was normal in size. There is no  left ventricular hypertrophy. Left ventricular diastolic parameters were  normal.   Right Ventricle: The right ventricular size is normal. No increase in  right ventricular wall thickness. Right ventricular systolic function is  normal. Tricuspid regurgitation signal is inadequate for assessing PA  pressure.   Left Atrium: Left atrial size was normal in size.   Right Atrium: Right atrial size was normal in size.   Pericardium: There is no evidence of pericardial effusion.   Mitral Valve: The mitral valve is normal in structure. Trivial mitral  valve regurgitation. No evidence of mitral valve stenosis.   Tricuspid Valve: The tricuspid valve is normal in structure. Tricuspid  valve regurgitation is trivial. No evidence of tricuspid stenosis.   Aortic Valve: The aortic valve is grossly normal. Aortic valve  regurgitation is not visualized. No aortic stenosis is present.   Pulmonic  Valve: The pulmonic valve was not well visualized. Pulmonic valve  regurgitation is not visualized. No evidence of pulmonic stenosis.   Aorta: Aortic dilatation noted. There is mild dilatation of the aortic  root, measuring 41 mm.   Venous: The inferior vena cava is normal in size with greater than 50%  respiratory  variability, suggesting right atrial pressure of 3 mmHg.   IAS/Shunts: No atrial level shunt detected by color flow Doppler.    Laboratory Data:  High Sensitivity Troponin:   Recent Labs  Lab 02/07/21 1232 02/07/21 1526  TROPONINIHS <2 3     Chemistry Recent Labs  Lab 02/07/21 1232  NA 140  K 4.3  CL 106  CO2 26  GLUCOSE 98  BUN 10  CREATININE 0.89  CALCIUM 8.7*  GFRNONAA >60  ANIONGAP 8    No results for input(s): PROT, ALBUMIN, AST, ALT, ALKPHOS, BILITOT in the last 168 hours. Lipids No results for input(s): CHOL, TRIG, HDL, LABVLDL, LDLCALC, CHOLHDL in the last 168 hours.  Hematology Recent Labs  Lab 02/07/21 1232  WBC 7.9  RBC 5.09  HGB 15.5  HCT 46.1  MCV 90.6  MCH 30.5  MCHC 33.6  RDW 13.4  PLT 209   Thyroid No results for input(s): TSH, FREET4 in the last 168 hours.  BNPNo results for input(s): BNP, PROBNP in the last 168 hours.  DDimer No results for input(s): DDIMER in the last 168 hours.   Radiology/Studies:  DG Chest 2 View  Result Date: 02/07/2021 CLINICAL DATA:  Chest pain, shortness of breath EXAM: CHEST - 2 VIEW COMPARISON:  11/22/2020 FINDINGS: The heart size and mediastinal contours are within normal limits. Both lungs are clear. The visualized skeletal structures are unremarkable. IMPRESSION: No active cardiopulmonary disease. Electronically Signed   By: Elmer Picker M.D.   On: 02/07/2021 12:58   CT Angio Chest/Abd/Pel for Dissection W and/or W/WO  Result Date: 02/07/2021 CLINICAL DATA:  Chest and back pain since this morning. Aortic dissection suspected. EXAM: CT ANGIOGRAPHY CHEST, ABDOMEN AND PELVIS TECHNIQUE:  Non-contrast CT of the chest was initially obtained. Multidetector CT imaging through the chest, abdomen and pelvis was performed using the standard protocol during bolus administration of intravenous contrast. Multiplanar reconstructed images and MIPs were obtained and reviewed to evaluate the vascular anatomy. RADIATION DOSE REDUCTION: This exam was performed according to the departmental dose-optimization program which includes automated exposure control, adjustment of the mA and/or kV according to patient size and/or use of iterative reconstruction technique. CONTRAST:  151mL OMNIPAQUE IOHEXOL 350 MG/ML SOLN COMPARISON:  Chest CT 04/05/2020. Noncontrast abdominopelvic CT 04/27/2015. FINDINGS: CTA CHEST FINDINGS Cardiovascular: No acute vascular findings are identified. Specifically, no evidence of aortic dissection or aneurysm. The pulmonary arteries are well opacified with contrast. No evidence of acute pulmonary embolism. Minimal coronary artery atherosclerosis noted. The heart size is normal. There is no pericardial effusion. Mediastinum/Nodes: There are no enlarged mediastinal, hilar or axillary lymph nodes.Stable soft tissue density in the pre-vascular space attributed to residual thymic tissue. The thyroid gland, trachea and esophagus demonstrate no significant findings. Lungs/Pleura: No pleural effusion or pneumothorax. Stable mild paraseptal emphysema and noncalcified 3 mm left lower lobe pulmonary nodule on image 100/7, consistent with a benign finding. Upper abdomen:  Unremarkable.  There is no adrenal mass. Musculoskeletal/Chest wall: There is no chest wall mass or suspicious osseous finding. Review of the MIP images confirms the above findings. CTA ABDOMEN AND PELVIS FINDINGS VASCULAR Aorta: Normal caliber aorta without aneurysm, dissection, vasculitis or significant stenosis. Minimal atherosclerosis. Celiac: Patent without evidence of aneurysm, dissection, vasculitis or significant stenosis. SMA:  Patent without evidence of aneurysm, dissection, vasculitis or significant stenosis. Renals: Both renal arteries are patent without evidence of aneurysm, dissection, vasculitis, fibromuscular dysplasia or significant stenosis. Small accessory renal arteries bilaterally. IMA: Patent without evidence of aneurysm, dissection, vasculitis  or significant stenosis. Inflow: Patent without evidence of aneurysm, dissection, vasculitis or significant stenosis. Veins: Not opacified with contrast.  Grossly unremarkable. Review of the MIP images confirms the above findings. NON-VASCULAR Hepatobiliary: No focal hepatic abnormalities identified as imaged in the arterial phase. The gallbladder is contracted. No evidence of gallstones or gallbladder wall thickening. No biliary dilatation. Pancreas: Unremarkable. No pancreatic ductal dilatation or surrounding inflammatory changes. Spleen: Normal in size without focal abnormality. Adrenals/Urinary Tract: Adrenal glands are unremarkable. Kidneys are normal, without renal calculi, focal lesion, or hydronephrosis. Bladder is unremarkable. Stomach/Bowel: No enteric contrast administered. The stomach appears unremarkable for its degree of distention. No evidence of bowel wall thickening, distention or surrounding inflammation. The appendix appears normal. Lymphatic: No enlarged abdominopelvic lymph nodes. Reproductive: The prostate gland and seminal vesicles appear unremarkable. Other: Stable small umbilical hernia containing only fat. No ascites or free air. Musculoskeletal: No acute or significant osseous findings. Mild spondylosis. Review of the MIP images confirms the above findings. IMPRESSION: 1. No evidence of aortic dissection or other acute vascular findings in the chest, abdomen or pelvis. 2. Minimal abdominal aortic atherosclerosis. 3. Mild paraseptal Emphysema (ICD10-J43.9). Electronically Signed   By: Richardean Sale M.D.   On: 02/07/2021 14:35     Assessment and Plan:    Pleuritic chest pain  Shortness of breath Minimal CAD  Paroxysmal SVT Hyperlipidemia COPD  Stuart Hill was seen in the emergency department by me, he reports he still is experiencing intermittent pleuritic chest and shortness of breath.  Chest CTA does not show any evidence of pulmonary embolism , no aortic dissection.  Troponin done in the ED negative x2, EKG with no acute ischemic changes.  With his shortness of breath he can benefit from a repeat echocardiogram his echocardiogram which was done in August 2022 was essentially normal.  Due to his known COPD and with active shortness of breath I think also he will be beneficial to give the patient nebulizer treatment and likely steroids.  Unclear etiology of his vomiting and throat tightness eventually he may benefit from GI evaluation which can be in the outpatient setting.  Will defer to the primary team.  Hyperlipidemia - continue with current statin medication.  Paroxysmal SVT this was a very short run seen on monitor after patient reported significant palpitations.  Was on Coreg 3.125 mg twice daily but have shown significant bradycardia we will stop this medication.  May also have underlying untreated anxiety.  Risk Assessment/Risk Scores:         For questions or updates, please contact [facility] Please consult [email or web address] for contact info under    Signed, Berniece Salines, DO  02/07/2021 7:11 PM

## 2021-02-07 NOTE — ED Provider Notes (Signed)
[facility] EMERGENCY DEPARTMENT Provider Note   CSN: [id number] Arrival date & time: 02/07/21  1154     History  Chief Complaint  Patient presents with   Chest Pain    Stuart Hill is a 51 y.o. male with past medical history significant for tobacco abuse, alcohol abuse, polycythemia vera, HLD, arthritis, anxiety, nonobstructive CAD as diagnosed on coronary CTA 09/06/2020 who presents with chest pain.  The patient states that he woke up with a substernal chest pressure this morning that radiated up into his neck.  It was worse with exertion.  He took 2 nitro with improvement in the pain radiating to his neck, but the substernal pressure was still there.  He has associated shortness of breath and says it feels like he cannot take a deep breath.  He had an episode of nausea and vomiting yesterday morning as well as this morning.  He denies diaphoresis.  He reports a history of sudden cardiac death in his brother.     Home Medications Prior to Admission medications   Medication Sig Start Date End Date Taking? Authorizing Provider  ipratropium-albuterol (DUONEB) 0.5-2.5 (3) MG/3ML SOLN Take 3 mLs by nebulization once for 1 dose. 02/07/21 02/07/21 Yes [place] Stell, Amalia Hailey, MD  methylPREDNISolone sodium succinate (SOLU-MEDROL) 125 mg/2 mL injection Inject 2 mLs (125 mg total) into the vein once for 1 dose. 02/07/21 02/07/21 Yes Antonio Woodhams, Amalia Hailey, MD  albuterol (VENTOLIN HFA) 108 (90 Base) MCG/ACT inhaler Inhale 1-2 puffs into the lungs every 6 (six) hours as needed for wheezing or shortness of breath. 04/03/20   Copland, Gay Filler, MD  ALPRAZolam (XANAX) 1 MG tablet TAKE 1 TABLET BY MOUTH THREE TIMES A DAY AS NEEDED FOR ANXIETY 01/01/21   Copland, Gay Filler, MD  aspirin EC 81 MG tablet Take 81 mg by mouth daily. Swallow whole. Patient take two pills daily.    [provider]  carvedilol (COREG) 3.125 MG tablet Take 1 tablet (3.125 mg total) by mouth 2 (two) times daily. 01/06/21  04/06/21  Tobb, Kardie, DO  nitroGLYCERIN (NITROSTAT) 0.4 MG SL tablet Place 1 tablet (0.4 mg total) under the tongue every 5 (five) minutes as needed for chest pain. Patient not taking: Reported on 01/06/2021 11/08/19   Rudean Haskell A, MD  rosuvastatin (CRESTOR) 10 MG tablet TAKE 1 TABLET BY MOUTH ONCE DAILY AT BEDTIME 01/28/21   Copland, Gay Filler, MD  sildenafil (REVATIO) 20 MG tablet TAKE 1-3 TABLETS BY MOUTH PRIOR TO SEXUAL ACTIVITY AS NEEDED Patient taking differently: Take 20 mg by mouth as needed (ed). 01/11/20   Copland, Gay Filler, MD  tiotropium (SPIRIVA HANDIHALER) 18 MCG inhalation capsule Place 1 capsule (18 mcg total) into inhaler and inhale daily. Patient not taking: Reported on 01/06/2021 05/31/20   Copland, Gay Filler, MD  traMADol (ULTRAM) 50 MG tablet TAKE 1 BY MOUTH DAILY AS NEEDED FOR BACK PAIN Patient not taking: Reported on 10/23/2020 01/31/20   Copland, Gay Filler, MD  Vitamin D, Ergocalciferol, (DRISDOL) 1.25 MG (50000 UNIT) CAPS capsule Take 1 capsule (50,000 Units total) by mouth every 7 (seven) days. Patient not taking: Reported on 01/06/2021 09/02/20   Berniece Salines, DO      Allergies    Plavix [clopidogrel bisulfate]; Afrin [nasal spray]; Antihistamines, chlorpheniramine-type; Benadryl [diphenhydramine hcl]; Buprenorphine hcl; Carisoprodol; Codeine; Metoclopramide; Morphine and related; Oxymetazoline; and Pheniramine    Review of Systems   Review of Systems  Constitutional:  Positive for fatigue. Negative for chills and fever.  HENT:  Negative for congestion.   Eyes:  Negative for visual disturbance.  Respiratory:  Positive for chest tightness and shortness of breath. Negative for cough.   Cardiovascular:  Positive for chest pain. Negative for leg swelling.  Gastrointestinal:  Positive for nausea and vomiting. Negative for abdominal pain.  Neurological:  Positive for light-headedness. Negative for facial asymmetry, weakness, numbness and headaches.   Psychiatric/Behavioral:  The patient is nervous/anxious.    Physical Exam Updated Vital Signs BP 112/79    Pulse (!) 44    Temp 98.4 F (36.9 C) (Oral)    Resp 19    SpO2 98%   Physical Exam Vitals and nursing note reviewed.  Constitutional:      General: He is not in acute distress.    Appearance: He is well-developed. He is not ill-appearing.  HENT:     Head: Normocephalic and atraumatic.     Right Ear: External ear normal.     Left Ear: External ear normal.     Nose: Nose normal.     Mouth/Throat:     Pharynx: Oropharynx is clear.  Eyes:     Extraocular Movements: Extraocular movements intact.     Conjunctiva/sclera: Conjunctivae normal.     Pupils: Pupils are equal, round, and reactive to light.  Cardiovascular:     Rate and Rhythm: Normal rate and regular rhythm.     Pulses:          Radial pulses are 2+ on the right side and 2+ on the left side.     Heart sounds: No murmur heard. Pulmonary:     Effort: Pulmonary effort is normal. No respiratory distress.     Breath sounds: Normal breath sounds.  Abdominal:     Palpations: Abdomen is soft.     Tenderness: There is no abdominal tenderness.  Musculoskeletal:        General: No swelling.     Cervical back: Neck supple.     Right lower leg: No edema.     Left lower leg: No edema.  Skin:    General: Skin is warm and dry.     Capillary Refill: Capillary refill takes less than 2 seconds.  Neurological:     General: No focal deficit present.     Mental Status: He is alert and oriented to person, place, and time.  Psychiatric:        Mood and Affect: Mood normal.    ED Results / Procedures / Treatments   Labs (all labs ordered are listed, but only abnormal results are displayed) Labs Reviewed  BASIC METABOLIC PANEL - Abnormal; Notable for the following components:      Result Value   Calcium 8.7 (*)    All other components within normal limits  CBC  CBG MONITORING, ED  TROPONIN I (HIGH SENSITIVITY)  TROPONIN  I (HIGH SENSITIVITY)    EKG EKG Interpretation  Date/Time:  Friday February 07 2021 12:04:14 EST Ventricular Rate:  55 PR Interval:  182 QRS Duration: 94 QT Interval:  422 QTC Calculation: 403 R Axis:   -20 Text Interpretation: Sinus bradycardia Septal infarct , age undetermined Abnormal ECG When compared with ECG of 22-Nov-2020 09:53, PREVIOUS ECG IS PRESENT When compared to prior, previous t wave inversion in lead 3 is now upright. No STEMI Confirmed by Antony Blackbird [phone]) on 02/07/2021 5:49:18 PM  Radiology DG Chest 2 View  Result Date: 02/07/2021 CLINICAL DATA:  Chest pain, shortness of breath EXAM: CHEST - 2 VIEW COMPARISON:  11/22/2020 FINDINGS: The heart size and mediastinal contours are within normal limits. Both lungs are clear. The visualized skeletal structures are unremarkable. IMPRESSION: No active cardiopulmonary disease. Electronically Signed   By: Elmer Picker M.D.   On: 02/07/2021 12:58   CT Angio Chest/Abd/Pel for Dissection W and/or W/WO  Result Date: 02/07/2021 CLINICAL DATA:  Chest and back pain since this morning. Aortic dissection suspected. EXAM: CT ANGIOGRAPHY CHEST, ABDOMEN AND PELVIS TECHNIQUE: Non-contrast CT of the chest was initially obtained. Multidetector CT imaging through the chest, abdomen and pelvis was performed using the standard protocol during bolus administration of intravenous contrast. Multiplanar reconstructed images and MIPs were obtained and reviewed to evaluate the vascular anatomy. RADIATION DOSE REDUCTION: This exam was performed according to the departmental dose-optimization program which includes automated exposure control, adjustment of the mA and/or kV according to patient size and/or use of iterative reconstruction technique. CONTRAST:  149mL OMNIPAQUE IOHEXOL 350 MG/ML SOLN COMPARISON:  Chest CT 04/05/2020. Noncontrast abdominopelvic CT 04/27/2015. FINDINGS: CTA CHEST FINDINGS Cardiovascular: No acute vascular findings are  identified. Specifically, no evidence of aortic dissection or aneurysm. The pulmonary arteries are well opacified with contrast. No evidence of acute pulmonary embolism. Minimal coronary artery atherosclerosis noted. The heart size is normal. There is no pericardial effusion. Mediastinum/Nodes: There are no enlarged mediastinal, hilar or axillary lymph nodes.Stable soft tissue density in the pre-vascular space attributed to residual thymic tissue. The thyroid gland, trachea and esophagus demonstrate no significant findings. Lungs/Pleura: No pleural effusion or pneumothorax. Stable mild paraseptal emphysema and noncalcified 3 mm left lower lobe pulmonary nodule on image 100/7, consistent with a benign finding. Upper abdomen:  Unremarkable.  There is no adrenal mass. Musculoskeletal/Chest wall: There is no chest wall mass or suspicious osseous finding. Review of the MIP images confirms the above findings. CTA ABDOMEN AND PELVIS FINDINGS VASCULAR Aorta: Normal caliber aorta without aneurysm, dissection, vasculitis or significant stenosis. Minimal atherosclerosis. Celiac: Patent without evidence of aneurysm, dissection, vasculitis or significant stenosis. SMA: Patent without evidence of aneurysm, dissection, vasculitis or significant stenosis. Renals: Both renal arteries are patent without evidence of aneurysm, dissection, vasculitis, fibromuscular dysplasia or significant stenosis. Small accessory renal arteries bilaterally. IMA: Patent without evidence of aneurysm, dissection, vasculitis or significant stenosis. Inflow: Patent without evidence of aneurysm, dissection, vasculitis or significant stenosis. Veins: Not opacified with contrast.  Grossly unremarkable. Review of the MIP images confirms the above findings. NON-VASCULAR Hepatobiliary: No focal hepatic abnormalities identified as imaged in the arterial phase. The gallbladder is contracted. No evidence of gallstones or gallbladder wall thickening. No biliary  dilatation. Pancreas: Unremarkable. No pancreatic ductal dilatation or surrounding inflammatory changes. Spleen: Normal in size without focal abnormality. Adrenals/Urinary Tract: Adrenal glands are unremarkable. Kidneys are normal, without renal calculi, focal lesion, or hydronephrosis. Bladder is unremarkable. Stomach/Bowel: No enteric contrast administered. The stomach appears unremarkable for its degree of distention. No evidence of bowel wall thickening, distention or surrounding inflammation. The appendix appears normal. Lymphatic: No enlarged abdominopelvic lymph nodes. Reproductive: The prostate gland and seminal vesicles appear unremarkable. Other: Stable small umbilical hernia containing only fat. No ascites or free air. Musculoskeletal: No acute or significant osseous findings. Mild spondylosis. Review of the MIP images confirms the above findings. IMPRESSION: 1. No evidence of aortic dissection or other acute vascular findings in the chest, abdomen or pelvis. 2. Minimal abdominal aortic atherosclerosis. 3. Mild paraseptal Emphysema (ICD10-J43.9). Electronically Signed   By: Richardean Sale M.D.   On: 02/07/2021 14:35    Procedures Procedures  Medications Ordered in ED Medications  acetaminophen (TYLENOL) tablet 650 mg (has no administration in time range)  ondansetron (ZOFRAN) injection 4 mg (has no administration in time range)  enoxaparin (LOVENOX) injection 40 mg (40 mg Subcutaneous Given 02/07/21 2232)  alum & mag hydroxide-simeth (MAALOX/MYLANTA) 200-200-20 MG/5ML suspension 30 mL (has no administration in time range)  ipratropium-albuterol (DUONEB) 0.5-2.5 (3) MG/3ML nebulizer solution 3 mL (has no administration in time range)  predniSONE (DELTASONE) tablet 50 mg (50 mg Oral Given 02/08/21 0933)  aspirin EC tablet 162 mg (162 mg Oral Given 02/07/21 2232)  rosuvastatin (CRESTOR) tablet 10 mg (10 mg Oral Given 02/07/21 2232)  pantoprazole (PROTONIX) EC tablet 40 mg (40 mg Oral Given  02/08/21 0933)  polyvinyl alcohol (LIQUIFILM TEARS) 1.4 % ophthalmic solution 1 drop (has no administration in time range)  ALPRAZolam (XANAX) tablet 1 mg (1 mg Oral Given 02/08/21 0756)  ALPRAZolam Duanne Moron) tablet 2 mg (2 mg Oral Given 02/07/21 2231)  iohexol (OMNIPAQUE) 350 MG/ML injection 100 mL (100 mLs Intravenous Contrast Given 02/07/21 1420)    ED Course/ Medical Decision Making/ A&P                            Patient presents with chest discomfort as scribed in HPI above.  Physical exam with no significant findings.  EKG shows sinus bradycardia with no acute signs of ischemia.  T wave inversion previously noted in lead III is now upright.  Labs and imaging reviewed by myself.  CXR with no acute cardiopulmonary abnormality.  CBC is normal.  Metabolic panel without significant abnormalities.  Troponin is normal x2, doubt ACS.  CTA dissection study with no evidence of dissection or other vascular abnormalities in the chest abdomen or pelvis.  No evidence of pulmonary embolism.  Ancillary findings include aortic atherosclerosis and mild paraseptal emphysema.  I reviewed the patient's records and he had coronary atherosclerosis noted on coronary CT from August 2022 thus he is not appropriate for restratification with the heart pathway.  I discussed the patient with cardiology who evaluated the patient in the emergency department and recommended admission for repeat echocardiogram as well as treatment for COPD with steroids and nebulizer treatment.  I discussed the patient with the hospitalist team who will admit the patient.  Final Clinical Impression(s) / ED Diagnoses Final diagnoses:  None    Rx / DC Orders      Shawntavia Saunders, Amalia Hailey, MD 02/08/21 1241    Tegeler, Gwenyth Allegra, MD 02/09/21 1030

## 2021-02-08 ENCOUNTER — Observation Stay (HOSPITAL_BASED_OUTPATIENT_CLINIC_OR_DEPARTMENT_OTHER): Payer: Self-pay

## 2021-02-08 DIAGNOSIS — R0609 Other forms of dyspnea: Secondary | ICD-10-CM

## 2021-02-08 DIAGNOSIS — R079 Chest pain, unspecified: Secondary | ICD-10-CM

## 2021-02-08 LAB — ECHOCARDIOGRAM COMPLETE
Area-P 1/2: 3.3 cm2
S' Lateral: 3.3 cm

## 2021-02-08 LAB — MRSA NEXT GEN BY PCR, NASAL: MRSA by PCR Next Gen: NOT DETECTED

## 2021-02-08 LAB — HIV ANTIBODY (ROUTINE TESTING W REFLEX): HIV Screen 4th Generation wRfx: NONREACTIVE

## 2021-02-08 MED ORDER — PREDNISONE 50 MG PO TABS: 50.0000 mg | ORAL_TABLET | Freq: Every day | ORAL | 0 refills | Status: DC

## 2021-02-08 NOTE — ED Notes (Signed)
Breakfast orders placed 

## 2021-02-08 NOTE — Discharge Summary (Signed)
Physician Discharge Summary  Patient ID: QUE MENEELY MRN: [id number] DOB/AGE: 26-Jul-1970 51 y.o.  Admit date: 02/07/2021 Discharge date: 02/08/2021  Admission Diagnoses:  Discharge Diagnoses:  Principal Problem:   Chest pain Active Problems:   Generalized anxiety disorder   Other hyperlipidemia   Minimal CAD   Paroxysmal SVT (supraventricular tachycardia) (HCC)   Emphysema of lung ([place])   Discharged Condition: stable  Hospital Course: 51 year old male presents emergency department with shortness of breath and chest pain.  Patient has been having symptoms for weeks and thinks it is due to his aneurysm that he has.  Denies any ankle swelling.  Patient was treated for COPD exacerbation and placed on prednisone and felt better.  He is also placed on albuterol nebulizers.  He had serial cardiac enzymes which were all negative.  Cardiac echo also unrevealing and normal.  Cardiology was consulted recommending holding his beta-blocker because he was slightly bradycardic.  Recommending continue prednisone.  Did not think patient had any symptoms or signs of pericarditis or his symptoms were due to underlying cardiology origin.  Suspect maybe he has component of COPD.  He is not formally been evaluated for this.  Patient will need to be formally evaluated with PFTs with his primary care physician.  Patient also has several questions concerning surgical intervention for his aneurysm which is less than 5 cm.  I will also defer this surgical evaluation to his primary care physician as patient very adamant that he wants this surgically repaired so that he can function at work normally.  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  Patient being discharged home in stable and improved condition with a prednisone taper.  His cardiologist is Dr. Jorene Minors who he is to see also in approximately 2 weeks.  His Coreg has been stopped again secondary to  cardiology recommendations for slightly low heart rate in the 40s.  Suspect patient has a component of anxiety contributing to his symptoms.  Consults: cardiology  Discharge Exam: Blood pressure (!) 128/91, pulse 60, temperature 97.6 F (36.4 C), temperature source Oral, resp. rate 16, weight 85.2 kg, SpO2 95 %. General appearance: alert and cooperative Neck: no adenopathy, no carotid bruit, no JVD, supple, symmetrical, trachea midline, and thyroid not enlarged, symmetric, no tenderness/mass/nodules Resp: clear to auscultation bilaterally Cardio: regular rate and rhythm, S1, S2 normal, no murmur, click, rub or gallop GI: soft, non-tender; bowel sounds normal; no masses,  no organomegaly Extremities: extremities normal, atraumatic, no cyanosis or edema Pulses: 2+ and symmetric Skin: Skin color, texture, turgor normal. No rashes or lesions  Disposition: Discharge disposition: 01-Home or Self Care       Discharge Instructions     Diet - low sodium heart healthy   Complete by: As directed    Increase activity slowly   Complete by: As directed       Allergies as of 02/08/2021       Reactions   Plavix [clopidogrel Bisulfate] Other (See Comments)   Confusion, lethargy, slurred speech   Afrin [nasal Spray] Hives   Antihistamines, Chlorpheniramine-type Hives   Benadryl [diphenhydramine Hcl] Hives   Buprenorphine Hcl Hives   Carisoprodol Hives   Codeine Hives   Metoclopramide Hives   Morphine And Related Hives   Oxymetazoline Hives   Pheniramine Hives, Rash        Medication List     STOP taking these medications    carvedilol 3.125 MG  tablet Commonly known as: COREG       TAKE these medications    albuterol 108 (90 Base) MCG/ACT inhaler Commonly known as: VENTOLIN HFA Inhale 1-2 puffs into the lungs every 6 (six) hours as needed for wheezing or shortness of breath.   ALPRAZolam 1 MG tablet Commonly known as: XANAX TAKE 1 TABLET BY MOUTH THREE TIMES A DAY AS  NEEDED FOR ANXIETY What changed: See the new instructions.   aspirin EC 81 MG tablet Take 162 mg by mouth at bedtime. Swallow whole.   NEXIUM PO Take 1 tablet by mouth as needed (acid reflux).   nitroGLYCERIN 0.4 MG SL tablet Commonly known as: NITROSTAT Place 1 tablet (0.4 mg total) under the tongue every 5 (five) minutes as needed for chest pain.   predniSONE 50 MG tablet Commonly known as: DELTASONE Take 1 tablet (50 mg total) by mouth daily with breakfast.   rosuvastatin 10 MG tablet Commonly known as: CRESTOR TAKE 1 TABLET BY MOUTH ONCE DAILY AT BEDTIME   sildenafil 20 MG tablet Commonly known as: REVATIO TAKE 1-3 TABLETS BY MOUTH PRIOR TO SEXUAL ACTIVITY AS NEEDED What changed: See the new instructions.   Spiriva HandiHaler 18 MCG inhalation capsule Generic drug: tiotropium Place 1 capsule (18 mcg total) into inhaler and inhale daily.   traMADol 50 MG tablet Commonly known as: ULTRAM TAKE 1 BY MOUTH DAILY AS NEEDED FOR BACK PAIN   VISINE OP Apply 1 drop to eye as needed (itchy, dryness).   Vitamin D (Ergocalciferol) 1.25 MG (50000 UNIT) Caps capsule Commonly known as: DRISDOL Take 1 capsule (50,000 Units total) by mouth every 7 (seven) days.         Signed: Tonjua Rossetti A 02/08/2021, 1:27 PM

## 2021-02-08 NOTE — Plan of Care (Signed)

## 2021-02-08 NOTE — Progress Notes (Signed)
Progress Note  Patient Name: Stuart Hill Date of Encounter: 02/08/2021  [facility] Cardiologist: Berniece Salines, DO   Subjective   Chest pain has resolved, was worse w/ deep inspiration, better if he relaxed. Partial relief w/ NTG  Inpatient Medications    Scheduled Meds:  ALPRAZolam  1 mg Oral q AM   ALPRAZolam  2 mg Oral QHS   aspirin EC  162 mg Oral QHS   enoxaparin (LOVENOX) injection  40 mg Subcutaneous Q24H   pantoprazole  40 mg Oral Daily   predniSONE  50 mg Oral Daily   rosuvastatin  10 mg Oral QHS   Continuous Infusions:  PRN Meds: acetaminophen, alum & mag hydroxide-simeth, ipratropium-albuterol, ondansetron (ZOFRAN) IV, polyvinyl alcohol   Vital Signs    Vitals:   02/08/21 0200 02/08/21 0400 02/08/21 0600 02/08/21 0740  BP: 125/76 120/75 120/84 126/84  Pulse: (!) 49 (!) 49 (!) 52 (!) 56  Resp: '17 16 17 14  ' Temp:      TempSrc:      SpO2: 99% 96% 97% 98%   No intake or output data in the 24 hours ending 02/08/21 0818 Last 3 Weights 01/06/2021 12/11/2020 10/23/2020  Weight (lbs) 183 lb 9.6 oz 186 lb 12.8 oz 186 lb 1.9 oz  Weight (kg) 83.28 kg 84.732 kg 84.423 kg      Telemetry    SR, occ PVCs, sinus brady to high 30s - Personally Reviewed  ECG    None today - Personally Reviewed  Physical Exam   GEN: No acute distress.   Neck: No JVD Cardiac: RRR, no murmurs, no rubs, or gallops.  Respiratory: Clear to auscultation bilaterally. GI: Soft, nontender, non-distended  MS: No edema; No deformity. Neuro:  Nonfocal  Psych: Normal affect   Labs    High Sensitivity Troponin:   Recent Labs  Lab 02/07/21 1232 02/07/21 1526  TROPONINIHS <2 3     Chemistry Recent Labs  Lab 02/07/21 1232  NA 140  K 4.3  CL 106  CO2 26  GLUCOSE 98  BUN 10  CREATININE 0.89  CALCIUM 8.7*  GFRNONAA >60  ANIONGAP 8    Lipids No results for input(s): CHOL, TRIG, HDL, LABVLDL, LDLCALC, CHOLHDL in the last 168 hours.  Hematology Recent Labs  Lab  02/07/21 1232  WBC 7.9  RBC 5.09  HGB 15.5  HCT 46.1  MCV 90.6  MCH 30.5  MCHC 33.6  RDW 13.4  PLT 209   Thyroid No results for input(s): TSH, FREET4 in the last 168 hours.  BNPNo results for input(s): BNP, PROBNP in the last 168 hours.  DDimer No results for input(s): DDIMER in the last 168 hours.  No results found for: Micheline Rough   Radiology    DG Chest 2 View  Result Date: 02/07/2021 CLINICAL DATA:  Chest pain, shortness of breath EXAM: CHEST - 2 VIEW COMPARISON:  11/22/2020 FINDINGS: The heart size and mediastinal contours are within normal limits. Both lungs are clear. The visualized skeletal structures are unremarkable. IMPRESSION: No active cardiopulmonary disease. Electronically Signed   By: Elmer Picker M.D.   On: 02/07/2021 12:58   CT Angio Chest/Abd/Pel for Dissection W and/or W/WO  Result Date: 02/07/2021 CLINICAL DATA:  Chest and back pain since this morning. Aortic dissection suspected. EXAM: CT ANGIOGRAPHY CHEST, ABDOMEN AND PELVIS TECHNIQUE: Non-contrast CT of the chest was initially obtained. Multidetector CT imaging through the chest, abdomen and pelvis was performed using the standard protocol during bolus administration of  intravenous contrast. Multiplanar reconstructed images and MIPs were obtained and reviewed to evaluate the vascular anatomy. RADIATION DOSE REDUCTION: This exam was performed according to the departmental dose-optimization program which includes automated exposure control, adjustment of the mA and/or kV according to patient size and/or use of iterative reconstruction technique. CONTRAST:  146m OMNIPAQUE IOHEXOL 350 MG/ML SOLN COMPARISON:  Chest CT 04/05/2020. Noncontrast abdominopelvic CT 04/27/2015. FINDINGS: CTA CHEST FINDINGS Cardiovascular: No acute vascular findings are identified. Specifically, no evidence of aortic dissection or aneurysm. The pulmonary arteries are well opacified with contrast. No evidence of acute pulmonary  embolism. Minimal coronary artery atherosclerosis noted. The heart size is normal. There is no pericardial effusion. Mediastinum/Nodes: There are no enlarged mediastinal, hilar or axillary lymph nodes.Stable soft tissue density in the pre-vascular space attributed to residual thymic tissue. The thyroid gland, trachea and esophagus demonstrate no significant findings. Lungs/Pleura: No pleural effusion or pneumothorax. Stable mild paraseptal emphysema and noncalcified 3 mm left lower lobe pulmonary nodule on image 100/7, consistent with a benign finding. Upper abdomen:  Unremarkable.  There is no adrenal mass. Musculoskeletal/Chest wall: There is no chest wall mass or suspicious osseous finding. Review of the MIP images confirms the above findings. CTA ABDOMEN AND PELVIS FINDINGS VASCULAR Aorta: Normal caliber aorta without aneurysm, dissection, vasculitis or significant stenosis. Minimal atherosclerosis. Celiac: Patent without evidence of aneurysm, dissection, vasculitis or significant stenosis. SMA: Patent without evidence of aneurysm, dissection, vasculitis or significant stenosis. Renals: Both renal arteries are patent without evidence of aneurysm, dissection, vasculitis, fibromuscular dysplasia or significant stenosis. Small accessory renal arteries bilaterally. IMA: Patent without evidence of aneurysm, dissection, vasculitis or significant stenosis. Inflow: Patent without evidence of aneurysm, dissection, vasculitis or significant stenosis. Veins: Not opacified with contrast.  Grossly unremarkable. Review of the MIP images confirms the above findings. NON-VASCULAR Hepatobiliary: No focal hepatic abnormalities identified as imaged in the arterial phase. The gallbladder is contracted. No evidence of gallstones or gallbladder wall thickening. No biliary dilatation. Pancreas: Unremarkable. No pancreatic ductal dilatation or surrounding inflammatory changes. Spleen: Normal in size without focal abnormality.  Adrenals/Urinary Tract: Adrenal glands are unremarkable. Kidneys are normal, without renal calculi, focal lesion, or hydronephrosis. Bladder is unremarkable. Stomach/Bowel: No enteric contrast administered. The stomach appears unremarkable for its degree of distention. No evidence of bowel wall thickening, distention or surrounding inflammation. The appendix appears normal. Lymphatic: No enlarged abdominopelvic lymph nodes. Reproductive: The prostate gland and seminal vesicles appear unremarkable. Other: Stable small umbilical hernia containing only fat. No ascites or free air. Musculoskeletal: No acute or significant osseous findings. Mild spondylosis. Review of the MIP images confirms the above findings. IMPRESSION: 1. No evidence of aortic dissection or other acute vascular findings in the chest, abdomen or pelvis. 2. Minimal abdominal aortic atherosclerosis. 3. Mild paraseptal Emphysema (ICD10-J43.9). Electronically Signed   By: WRichardean SaleM.D.   On: 02/07/2021 14:35    Cardiac Studies   ECHO: ordered  Patient Profile     51y.o. male w/ hx no sig CAD by CT 08/2020, PSVT, resting bradycardia, HLD, COPD, was admitted 1/13 w/ pleuritic chest pain.  Assessment & Plan    Chest pain, pleuritic & SOB - on steroids and PRN nebs, lungs clear - CP has resolved, ?2nd steroids >> since has gotten them, will not check ESR or CRP unless MD recommends - in case sx are GI >> continue Protonix - Coreg 3.125 mg d/c'd due to bradycardia - if echo ok, no further eval  2. SVT - none overnight -  follow    For questions or updates, please contact [place] Please consult [email or web address] for contact info under        Signed, Rosaria Ferries, PA-C  02/08/2021, 8:18 AM

## 2021-02-08 NOTE — ED Notes (Signed)
Pt transported to [place]; will go to [place] when finished; Palma Holter, [place] RN, notified

## 2021-02-08 NOTE — ED Notes (Signed)
Report given to [facility] RN

## 2021-02-08 NOTE — ED Notes (Signed)
ED TO INPATIENT HANDOFF REPORT  ED Nurse Name and Phone #: Fernanda Drum Name/Age/Gender Stuart Hill 51 y.o. male Room/Bed: 008C/008C  Code Status   Code Status: Full Code  Home/SNF/Other Home Patient oriented to: self, place, time, and situation Is this baseline? Yes   Triage Complete: Triage complete  Chief Complaint Chest pain [R07.9]  Triage Note Pt has bleeding disorder and extensive history/family history. Has woken up two days with n/v and sob. Reports mid chest pain that increases with respiration. Airway intact at triage.   Allergies Allergies  Allergen Reactions   Plavix [Clopidogrel Bisulfate] Other (See Comments)    Confusion, lethargy, slurred speech   Afrin [Nasal Spray] Hives   Antihistamines, Chlorpheniramine-Type Hives   Benadryl [Diphenhydramine Hcl] Hives   Buprenorphine Hcl Hives   Carisoprodol Hives   Codeine Hives   Metoclopramide Hives   Morphine And Related Hives   Oxymetazoline Hives   Pheniramine Hives and Rash    Level of Care/Admitting Diagnosis ED Disposition     ED Disposition  Admit   Condition  --   Comment  Hospital Area: [place] [[id number]]  Level of Care: Telemetry Cardiac [[id number]]  May place patient in observation at [facility] or [facility] if equivalent level of care is available:: No  Covid Evaluation: Asymptomatic Screening Protocol (No Symptoms)  Diagnosis: Chest pain [950932]  Admitting Physician: Vianne Bulls [[id number]]  Attending Physician: Vianne Bulls [[id number]]          B Medical/Surgery History Past Medical History:  Diagnosis Date   Allergy    Anxiety    Arthritis    Bell's palsy    Erythrocytosis 04/10/2015   Hyperlipidemia    Polycythemia vera ([place]) 05/03/2015   Past Surgical History:  Procedure Laterality Date   DENTAL SURGERY     HAND SURGERY     TONSILLECTOMY Bilateral 04/30/2017   Procedure: TONSILLECTOMY;  Surgeon: Jerrell Belfast, MD;  Location: [place];  Service: ENT;  Laterality: Bilateral;     A IV Location/Drains/Wounds Patient Lines/Drains/Airways Status     Active Line/Drains/Airways     Name Placement date Placement time Site Days   Peripheral IV 02/07/21 20 G Anterior;Distal;Right;Upper Arm 02/07/21  1418  Arm  1   Incision (Closed) 04/30/17 Throat 04/30/17  1113  -- 1380            Intake/Output Last 24 hours No intake or output data in the 24 hours ending 02/08/21 0802  Labs/Imaging Results for orders placed or performed during the hospital encounter of 02/07/21 (from the past 48 hour(s))  Basic metabolic panel     Status: Abnormal   Collection Time: 02/07/21 12:32 PM  Result Value Ref Range   Sodium 140 135 - 145 mmol/L   Potassium 4.3 3.5 - 5.1 mmol/L   Chloride 106 98 - 111 mmol/L   CO2 26 22 - 32 mmol/L   Glucose, Bld 98 70 - 99 mg/dL    Comment: Glucose reference range applies only to samples taken after fasting for at least 8 hours.   BUN 10 6 - 20 mg/dL   Creatinine, Ser 0.89 0.61 - 1.24 mg/dL   Calcium 8.7 (L) 8.9 - 10.3 mg/dL   GFR, Estimated >60 >60 mL/min    Comment: (NOTE) Calculated using the CKD-EPI Creatinine Equation (2021)    Anion gap 8 5 - 15    Comment: Performed at [facility] [place],  [place] [address]  Troponin I (High Sensitivity)     Status: None   Collection Time: 02/07/21 12:32 PM  Result Value Ref Range   Troponin I (High Sensitivity) <2 <18 ng/L    Comment: (NOTE) Elevated high sensitivity troponin I (hsTnI) values and significant  changes across serial measurements may suggest ACS but many other  chronic and acute conditions are known to elevate hsTnI results.  Refer to the "Links" section for chest pain algorithms and additional  guidance. Performed at [facility], [place] [address], [place] [address]   CBC     Status: None   Collection Time: 02/07/21 12:32 PM  Result Value Ref Range   WBC 7.9 4.0 - 10.5 K/uL   RBC 5.09  4.22 - 5.81 MIL/uL   Hemoglobin 15.5 13.0 - 17.0 g/dL   HCT 46.1 39.0 - 52.0 %   MCV 90.6 80.0 - 100.0 fL   MCH 30.5 26.0 - 34.0 pg   MCHC 33.6 30.0 - 36.0 g/dL   RDW 13.4 11.5 - 15.5 %   Platelets 209 150 - 400 K/uL   nRBC 0.0 0.0 - 0.2 %    Comment: Performed at [facility], [place] [address] [place], [place] [address]  Troponin I (High Sensitivity)     Status: None   Collection Time: 02/07/21  3:26 PM  Result Value Ref Range   Troponin I (High Sensitivity) 3 <18 ng/L    Comment: (NOTE) Elevated high sensitivity troponin I (hsTnI) values and significant  changes across serial measurements may suggest ACS but many other  chronic and acute conditions are known to elevate hsTnI results.  Refer to the "Links" section for chest pain algorithms and additional  guidance. Performed at [facility], [place] [address] [place], [place] [address]   Resp Panel by RT-PCR (Flu A&B, Covid) Nasopharyngeal Swab     Status: None   Collection Time: 02/07/21  7:24 PM   Specimen: Nasopharyngeal Swab; Nasopharyngeal(NP) swabs in vial transport medium  Result Value Ref Range   SARS Coronavirus 2 by RT PCR NEGATIVE NEGATIVE    Comment: (NOTE) SARS-CoV-2 target nucleic acids are NOT DETECTED.  The SARS-CoV-2 RNA is generally detectable in upper respiratory specimens during the acute phase of infection. The lowest concentration of SARS-CoV-2 viral copies this assay can detect is 138 copies/mL. A negative result does not preclude SARS-Cov-2 infection and should not be used as the sole basis for treatment or other patient management decisions. A negative result may occur with  improper specimen collection/handling, submission of specimen other than nasopharyngeal swab, presence of viral mutation(s) within the areas targeted by this assay, and inadequate number of viral copies(<138 copies/mL). A negative result must be combined with clinical observations, patient history, and  epidemiological information. The expected result is Negative.  Fact Sheet for Patients:  EntrepreneurPulse.com.au  Fact Sheet for Healthcare Providers:  IncredibleEmployment.be  This test is no t yet approved or cleared by the [place] FDA and  has been authorized for detection and/or diagnosis of SARS-CoV-2 by FDA under an Emergency Use Authorization (EUA). This EUA will remain  in effect (meaning this test can be used) for the duration of the COVID-19 declaration under Section 564(b)(1) of the Act, 21 U.S.C.section 360bbb-3(b)(1), unless the authorization is terminated  or revoked sooner.       Influenza A by PCR NEGATIVE NEGATIVE   Influenza B by PCR NEGATIVE NEGATIVE    Comment: (NOTE) The Xpert Xpress  SARS-CoV-2/FLU/RSV plus assay is intended as an aid in the diagnosis of influenza from Nasopharyngeal swab specimens and should not be used as a sole basis for treatment. Nasal washings and aspirates are unacceptable for Xpert Xpress SARS-CoV-2/FLU/RSV testing.  Fact Sheet for Patients: EntrepreneurPulse.com.au  Fact Sheet for Healthcare Providers: IncredibleEmployment.be  This test is not yet approved or cleared by the [place] FDA and has been authorized for detection and/or diagnosis of SARS-CoV-2 by FDA under an Emergency Use Authorization (EUA). This EUA will remain in effect (meaning this test can be used) for the duration of the COVID-19 declaration under Section 564(b)(1) of the Act, 21 U.S.C. section 360bbb-3(b)(1), unless the authorization is terminated or revoked.  Performed at [facility], [place] [address], [place], [place] [address]    DG Chest 2 View  Result Date: 02/07/2021 CLINICAL DATA:  Chest pain, shortness of breath EXAM: CHEST - 2 VIEW COMPARISON:  11/22/2020 FINDINGS: The heart size and mediastinal contours are within normal limits. Both lungs are clear. The  visualized skeletal structures are unremarkable. IMPRESSION: No active cardiopulmonary disease. Electronically Signed   By: Elmer Picker M.D.   On: 02/07/2021 12:58   CT Angio Chest/Abd/Pel for Dissection W and/or W/WO  Result Date: 02/07/2021 CLINICAL DATA:  Chest and back pain since this morning. Aortic dissection suspected. EXAM: CT ANGIOGRAPHY CHEST, ABDOMEN AND PELVIS TECHNIQUE: Non-contrast CT of the chest was initially obtained. Multidetector CT imaging through the chest, abdomen and pelvis was performed using the standard protocol during bolus administration of intravenous contrast. Multiplanar reconstructed images and MIPs were obtained and reviewed to evaluate the vascular anatomy. RADIATION DOSE REDUCTION: This exam was performed according to the departmental dose-optimization program which includes automated exposure control, adjustment of the mA and/or kV according to patient size and/or use of iterative reconstruction technique. CONTRAST:  157mL OMNIPAQUE IOHEXOL 350 MG/ML SOLN COMPARISON:  Chest CT 04/05/2020. Noncontrast abdominopelvic CT 04/27/2015. FINDINGS: CTA CHEST FINDINGS Cardiovascular: No acute vascular findings are identified. Specifically, no evidence of aortic dissection or aneurysm. The pulmonary arteries are well opacified with contrast. No evidence of acute pulmonary embolism. Minimal coronary artery atherosclerosis noted. The heart size is normal. There is no pericardial effusion. Mediastinum/Nodes: There are no enlarged mediastinal, hilar or axillary lymph nodes.Stable soft tissue density in the pre-vascular space attributed to residual thymic tissue. The thyroid gland, trachea and esophagus demonstrate no significant findings. Lungs/Pleura: No pleural effusion or pneumothorax. Stable mild paraseptal emphysema and noncalcified 3 mm left lower lobe pulmonary nodule on image 100/7, consistent with a benign finding. Upper abdomen:  Unremarkable.  There is no adrenal mass.  Musculoskeletal/Chest wall: There is no chest wall mass or suspicious osseous finding. Review of the MIP images confirms the above findings. CTA ABDOMEN AND PELVIS FINDINGS VASCULAR Aorta: Normal caliber aorta without aneurysm, dissection, vasculitis or significant stenosis. Minimal atherosclerosis. Celiac: Patent without evidence of aneurysm, dissection, vasculitis or significant stenosis. SMA: Patent without evidence of aneurysm, dissection, vasculitis or significant stenosis. Renals: Both renal arteries are patent without evidence of aneurysm, dissection, vasculitis, fibromuscular dysplasia or significant stenosis. Small accessory renal arteries bilaterally. IMA: Patent without evidence of aneurysm, dissection, vasculitis or significant stenosis. Inflow: Patent without evidence of aneurysm, dissection, vasculitis or significant stenosis. Veins: Not opacified with contrast.  Grossly unremarkable. Review of the MIP images confirms the above findings. NON-VASCULAR Hepatobiliary: No focal hepatic abnormalities identified as imaged in the arterial phase. The gallbladder is contracted. No evidence of gallstones or gallbladder wall thickening. No biliary  dilatation. Pancreas: Unremarkable. No pancreatic ductal dilatation or surrounding inflammatory changes. Spleen: Normal in size without focal abnormality. Adrenals/Urinary Tract: Adrenal glands are unremarkable. Kidneys are normal, without renal calculi, focal lesion, or hydronephrosis. Bladder is unremarkable. Stomach/Bowel: No enteric contrast administered. The stomach appears unremarkable for its degree of distention. No evidence of bowel wall thickening, distention or surrounding inflammation. The appendix appears normal. Lymphatic: No enlarged abdominopelvic lymph nodes. Reproductive: The prostate gland and seminal vesicles appear unremarkable. Other: Stable small umbilical hernia containing only fat. No ascites or free air. Musculoskeletal: No acute or significant  osseous findings. Mild spondylosis. Review of the MIP images confirms the above findings. IMPRESSION: 1. No evidence of aortic dissection or other acute vascular findings in the chest, abdomen or pelvis. 2. Minimal abdominal aortic atherosclerosis. 3. Mild paraseptal Emphysema (ICD10-J43.9). Electronically Signed   By: Richardean Sale M.D.   On: 02/07/2021 14:35    Pending Labs Unresulted Labs (From admission, onward)     Start     Ordered   02/14/21 0500  Creatinine, serum  (enoxaparin (LOVENOX)    CrCl >/= 30 ml/min)  Weekly,   R     Comments: while on enoxaparin therapy    02/07/21 1950   02/08/21 0500  HIV Antibody (routine testing w rflx)  (HIV Antibody (Routine testing w reflex) panel)  Tomorrow morning,   R        02/07/21 1950            Vitals/Pain Today's Vitals   02/08/21 0400 02/08/21 0600 02/08/21 0740 02/08/21 0801  BP: 120/75 120/84 126/84   Pulse: (!) 49 (!) 52 (!) 56   Resp: 16 17 14    Temp:      TempSrc:      SpO2: 96% 97% 98%   PainSc:    Asleep    Isolation Precautions No active isolations  Medications Medications  acetaminophen (TYLENOL) tablet 650 mg (has no administration in time range)  ondansetron (ZOFRAN) injection 4 mg (has no administration in time range)  enoxaparin (LOVENOX) injection 40 mg (40 mg Subcutaneous Given 02/07/21 2232)  alum & mag hydroxide-simeth (MAALOX/MYLANTA) 200-200-20 MG/5ML suspension 30 mL (has no administration in time range)  ipratropium-albuterol (DUONEB) 0.5-2.5 (3) MG/3ML nebulizer solution 3 mL (has no administration in time range)  predniSONE (DELTASONE) tablet 50 mg (50 mg Oral Given 02/07/21 2232)  aspirin EC tablet 162 mg (162 mg Oral Given 02/07/21 2232)  rosuvastatin (CRESTOR) tablet 10 mg (10 mg Oral Given 02/07/21 2232)  pantoprazole (PROTONIX) EC tablet 40 mg (has no administration in time range)  polyvinyl alcohol (LIQUIFILM TEARS) 1.4 % ophthalmic solution 1 drop (has no administration in time range)   ALPRAZolam (XANAX) tablet 1 mg (1 mg Oral Given 02/08/21 0756)  ALPRAZolam Duanne Moron) tablet 2 mg (2 mg Oral Given 02/07/21 2231)  iohexol (OMNIPAQUE) 350 MG/ML injection 100 mL (100 mLs Intravenous Contrast Given 02/07/21 1420)    Mobility walks Low fall risk   Focused Assessments    R Recommendations: See Admitting Provider Note  Report given to:   Additional Notes:

## 2021-02-08 NOTE — Progress Notes (Signed)
Pt got discharged to home, discharge instructions provided and patient showed understanding to it, IV taken out,Telemonitor DC,pt left unit in wheelchair with all of the belongings accompanied with a family member (wife)  Sayge Brienza,RN 

## 2021-02-08 NOTE — Progress Notes (Signed)
Echocardiogram 2D Echocardiogram has been performed.  Oneal Deputy Piers Baade RDCS 02/08/2021, 9:15 AM

## 2021-02-13 ENCOUNTER — Encounter: Payer: Self-pay | Admitting: Cardiology

## 2021-02-13 ENCOUNTER — Other Ambulatory Visit: Payer: Self-pay

## 2021-02-13 ENCOUNTER — Ambulatory Visit (INDEPENDENT_AMBULATORY_CARE_PROVIDER_SITE_OTHER): Payer: Self-pay | Admitting: Cardiology

## 2021-02-13 VITALS — BP 142/80 | HR 74 | Ht 75.0 in | Wt 191.4 lb

## 2021-02-13 DIAGNOSIS — I2510 Atherosclerotic heart disease of native coronary artery without angina pectoris: Secondary | ICD-10-CM

## 2021-02-13 DIAGNOSIS — F411 Generalized anxiety disorder: Secondary | ICD-10-CM

## 2021-02-13 DIAGNOSIS — Z720 Tobacco use: Secondary | ICD-10-CM

## 2021-02-13 DIAGNOSIS — E782 Mixed hyperlipidemia: Secondary | ICD-10-CM

## 2021-02-13 DIAGNOSIS — I77810 Thoracic aortic ectasia: Secondary | ICD-10-CM

## 2021-02-13 DIAGNOSIS — I471 Supraventricular tachycardia: Secondary | ICD-10-CM

## 2021-02-13 NOTE — Patient Instructions (Signed)
Medication Instructions:  Your physician recommends that you continue on your current medications as directed. Please refer to the Current Medication list given to you today.  *If you need a refill on your cardiac medications before your next appointment, please call your pharmacy*   Follow-Up: At [facility], you and your health needs are our priority.  As part of our continuing mission to provide you with exceptional heart care, we have created designated Provider Care Teams.  These Care Teams include your primary Cardiologist (physician) and Advanced Practice Providers (APPs -  Physician Assistants and Nurse Practitioners) who all work together to provide you with the care you need, when you need it.  We recommend signing up for the patient portal called "MyChart".  Sign up information is provided on this After Visit Summary.  MyChart is used to connect with patients for Virtual Visits (Telemedicine).  Patients are able to view lab/test results, encounter notes, upcoming appointments, etc.  Non-urgent messages can be sent to your provider as well.   To learn more about what you can do with MyChart, go to [email or web address].    Your next appointment:   6 month(s)  The format for your next appointment:   In Person  Provider:   Berniece Salines, DO

## 2021-02-13 NOTE — Progress Notes (Signed)
Cardiology Office Note:    Date:  02/13/2021   ID:  Stuart Hill, DOB 1970-03-26, MRN [id number]  PCP:  Darreld Mclean, MD  Cardiologist:  Berniece Salines, DO  Electrophysiologist:  None   Referring MD: Darreld Mclean, MD   " I am having some palpitations"  History of Present Illness:    Stuart Hill is a 51 y.o. male with a hx of  COPD, hyperlipidemia, polycythemia vera, dilatation of the proximal ascending aorta seen on echocardiogram, minimal coronary artery disease is here today for follow-up visit.  I first saw the patient in August 2022 at that time he was experiencing chest discomfort and has been referred by his primary care provider.  Given the risk factors we will send the patient for a coronary CTA and he also did have some shortness of breath so I sent him for echocardiogram.  Those results were discussed with the patient.  I saw the patient on January 06, 2021 at that time he reported that he had been experiencing intermittent lightheadedness, dizziness and palpitations.  His blood pressure was elevated at his visit that day was started carvedilol 3.125 mg twice daily given his prior I also placed a monitor on the patient.    In the interim I was able to review his monitor which showed paroxysmal supraventricular tachycardia.  He also did not tolerate a beta-blocker and his heart rate was significantly low.  He was then seen in the emergency department at [facility] last Friday because he was experiencing significant chest pain and shortness of breath.  During this time he was noted to have COPD exacerbation he was treated.  He had a repeat echocardiogram which was normal with no significant change in his aneurysm.  Today he tells me that he has had intermittent palpitations.  He feels also that his anxiety is playing a role in this.  The shortness of breath and chest pain has improved significantly.  He is here with his wife today.  He notes that his blood  pressure is mostly within normal unfortunately did not bring me his blood pressure log.   Past Medical History:  Diagnosis Date   Allergy    Anxiety    Arthritis    Bell's palsy    Erythrocytosis 04/10/2015   Hyperlipidemia    Polycythemia vera ([place]) 05/03/2015    Past Surgical History:  Procedure Laterality Date   DENTAL SURGERY     HAND SURGERY     TONSILLECTOMY Bilateral 04/30/2017   Procedure: TONSILLECTOMY;  Surgeon: Jerrell Belfast, MD;  Location: [place];  Service: ENT;  Laterality: Bilateral;    Current Medications: Current Meds  Medication Sig   albuterol (VENTOLIN HFA) 108 (90 Base) MCG/ACT inhaler Inhale 1-2 puffs into the lungs every 6 (six) hours as needed for wheezing or shortness of breath.   ALPRAZolam (XANAX) 1 MG tablet TAKE 1 TABLET BY MOUTH THREE TIMES A DAY AS NEEDED FOR ANXIETY (Patient taking differently: Take 1 mg by mouth 3 (three) times daily. Take 1 (1 mg) tablet in morning and 2 (2 mg) tablets at bedtime.)   aspirin EC 81 MG tablet Take 162 mg by mouth at bedtime. Swallow whole.   Esomeprazole Magnesium (NEXIUM PO) Take 1 tablet by mouth as needed (acid reflux).   nitroGLYCERIN (NITROSTAT) 0.4 MG SL tablet Place 1 tablet (0.4 mg total) under the tongue every 5 (five) minutes as needed for chest pain.   predniSONE (DELTASONE) 50 MG  tablet Take 1 tablet (50 mg total) by mouth daily with breakfast.   rosuvastatin (CRESTOR) 10 MG tablet TAKE 1 TABLET BY MOUTH ONCE DAILY AT BEDTIME (Patient taking differently: Take 10 mg by mouth at bedtime.)   Tetrahydrozoline HCl (VISINE OP) Apply 1 drop to eye as needed (itchy, dryness).   traMADol (ULTRAM) 50 MG tablet TAKE 1 BY MOUTH DAILY AS NEEDED FOR BACK PAIN   Vitamin D, Ergocalciferol, (DRISDOL) 1.25 MG (50000 UNIT) CAPS capsule Take 1 capsule (50,000 Units total) by mouth every 7 (seven) days.     Allergies:   Plavix [clopidogrel bisulfate]; Afrin [nasal spray]; Antihistamines,  chlorpheniramine-type; Benadryl [diphenhydramine hcl]; Buprenorphine hcl; Carisoprodol; Codeine; Metoclopramide; Morphine and related; Oxymetazoline; and Pheniramine   Social History   Socioeconomic History   Marital status: Married    Spouse name: Not on file   Number of children: Not on file   Years of education: Not on file   Highest education level: Not on file  Occupational History   Not on file  Tobacco Use   Smoking status: Every Day    Packs/day: 0.25    Years: 24.00    Pack years: 6.00    Types: Cigarettes   Smokeless tobacco: Never  Vaping Use   Vaping Use: Never used  Substance and Sexual Activity   Alcohol use: Yes    Alcohol/week: 1.0 standard drink    Types: 1 Cans of beer per week    Comment: 6-10 beer a week   Drug use: Yes    Types: Other-see comments, Marijuana    Comment: Marijuana every other day.   Sexual activity: Yes  Other Topics Concern   Not on file  Social History Narrative   Not on file   Social Determinants of Health   Financial Resource Strain: Not on file  Food Insecurity: Not on file  Transportation Needs: Not on file  Physical Activity: Not on file  Stress: Not on file  Social Connections: Not on file     Family History: The patient's family history includes COPD in his mother; Depression in his brother; Fibromyalgia in his mother; Heart disease in his brother, father, maternal grandfather, maternal grandmother, paternal grandfather, and paternal grandmother; Stroke in his father, maternal grandfather, maternal grandmother, paternal aunt, paternal grandfather, and paternal grandmother.  ROS:   Review of Systems  Constitution: Negative for decreased appetite, fever and weight gain.  HENT: Negative for congestion, ear discharge, hoarse voice and sore throat.   Eyes: Negative for discharge, redness, vision loss in right eye and visual halos.  Cardiovascular: Negative for chest pain, dyspnea on exertion, leg swelling, orthopnea and  palpitations.  Respiratory: Negative for cough, hemoptysis, shortness of breath and snoring.   Endocrine: Negative for heat intolerance and polyphagia.  Hematologic/Lymphatic: Negative for bleeding problem. Does not bruise/bleed easily.  Skin: Negative for flushing, nail changes, rash and suspicious lesions.  Musculoskeletal: Negative for arthritis, joint pain, muscle cramps, myalgias, neck pain and stiffness.  Gastrointestinal: Negative for abdominal pain, bowel incontinence, diarrhea and excessive appetite.  Genitourinary: Negative for decreased libido, genital sores and incomplete emptying.  Neurological: Negative for brief paralysis, focal weakness, headaches and loss of balance.  Psychiatric/Behavioral: Negative for altered mental status, depression and suicidal ideas.  Allergic/Immunologic: Negative for HIV exposure and persistent infections.    EKGs/Labs/Other Studies Reviewed:    The following studies were reviewed today:   EKG:  none today   Zio monitor  Patch Wear Time:  14 days and 0 hours  starting January 12, 2021. Indication: Palpitation.   Patient had a min HR of 37 bpm, max HR of 169 bpm, and avg HR of 64 bpm. Predominant underlying rhythm was Sinus Rhythm. Slight P wave morphology changes were noted. 3 Supraventricular Tachycardia runs occurred, the run with the fastest interval lasting  4 beats with a max rate of 160 bpm, the longest lasting 11 beats with an avg rate of 97 bpm. Some episodes of Supraventricular Tachycardia may be possible Atrial Tachycardia with variable block. Isolated SVEs were rare (<1.0%), SVE Couplets were rare  (<1.0%), and no SVE Triplets were present. Isolated VEs were rare (<1.0%), and no VE Couplets or VE Triplets were present.     Symptoms associated with sinus rhythm.   Conclusion: Studies remarkable for rare short runs of paroxysmal supraventricular tachycardia.  TTE 02/09/2020 IMPRESSIONS   1. Left ventricular ejection fraction, by  estimation, is 60 to 65%. The left ventricle has normal function. The left ventricle has no regional wall motion abnormalities. Left ventricular diastolic parameters were normal.   2. Right ventricular systolic function is normal. The right ventricular size is normal.   3. The mitral valve is normal in structure. No evidence of mitral valve regurgitation.   4. The aortic valve is normal in structure. Aortic valve regurgitation is not visualized.   5. 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   6. The inferior vena cava is normal in size with greater than 50% respiratory variability, suggesting right atrial pressure of 3 mmHg.  Comparison(s): No significant change from prior study.  Conclusion(s)/Recommendation(s): Otherwise normal echocardiogram, with minor abnormalities described in the report. Small aortic root aneurysm.  stable.   FINDINGS   Left Ventricle: Left ventricular ejection fraction, by estimation, is 60 to 65%. The left ventricle has normal function. The left ventricle has no  regional wall motion abnormalities. The left ventricular internal cavity  size was normal in size. There is   no left ventricular hypertrophy. Left ventricular diastolic parameters  were normal.   Right Ventricle: The right ventricular size is normal. No increase in  right ventricular wall thickness. Right ventricular systolic function is  normal.   Left Atrium: Left atrial size was normal in size.   Right Atrium: Right atrial size was normal in size.   Pericardium: There is no evidence of pericardial effusion.   Mitral Valve: The mitral valve is normal in structure. No evidence of  mitral valve regurgitation.   Tricuspid Valve: The tricuspid valve is normal in structure. Tricuspid  valve regurgitation is not demonstrated.   Aortic Valve: The aortic valve is normal in structure. Aortic valve  regurgitation is not visualized.   Pulmonic Valve: The pulmonic valve was not well visualized. Pulmonic valve   regurgitation is not visualized.   Aorta: The aortic root is normal in size and structure and Aortic root  aneurysm 4.1 cm.   Venous: The inferior vena cava is normal in size with greater than 50%  respiratory variability, suggesting right atrial pressure of 3 mmHg.   IAS/Shunts: No atrial level shunt detected by color flow Doppler.   CCTA 09/06/2020 mage quality: Good.   Noise artifact is: Limited.   Coronary calcium score is 24, which places the patient in the 80th percentile for age and sex matched control.   Coronary arteries: Normal coronary origins.  Right dominance.   Right Coronary Artery: No detectable plaque or stenosis.   Left Main Coronary Artery: No detectable plaque or stenosis.   Left Anterior Descending  Coronary Artery: Minimal mixed atherosclerotic plaque in the mid LAD, <25% stenosis   Ramus intermedius: No detectable plaque or stenosis.   Left Circumflex Artery: No detectable plaque or stenosis.   Aorta:   Measurements made in double oblique technique:   Sinus of Valsalva:   R-L: 43 mm   L-Non: 43 mm   R-Non: 43 mm   Mid ascending aorta (at PA bifurcation): 30 mm   Aortic Valve: No calcifications.   Other findings:   Normal pulmonary vein drainage into the left atrium.   Normal left atrial appendage without a thrombus.   Borderline dilation of main pulmonary artery.   IMPRESSION: 1. Minimal CAD in mid LAD, CADRADS = 1.   2. Coronary calcium score is 24, which places the patient in the 80th percentile for age and sex matched control.   3. Normal coronary origin with right dominance.   4. Mild ascending aorta dilation, 43 mm at sinus of Valsalva.     TTE 09/02/2020 IMPRESSIONS  1. Left ventricular ejection fraction, by estimation, is 55%. The left ventricle has normal function. The left ventricle has no regional wall  motion abnormalities. Left ventricular diastolic parameters were normal.   2. Right ventricular systolic function is  normal. The right ventricular  size is normal. Tricuspid regurgitation signal is inadequate for assessing PA pressure.   3. The mitral valve is normal in structure. Trivial mitral valve regurgitation. No evidence of mitral stenosis.   4. The aortic valve is grossly normal. Unable to determine aortic valve morphology due to image quality. Aortic valve regurgitation is not visualized. No aortic stenosis is present.   5. Aortic dilatation noted. There is mild dilatation of the aortic root, measuring 41 mm.   6. The inferior vena cava is normal in size with greater than 50% respiratory variability, suggesting right atrial pressure of 3 mmHg.   FINDINGS   Left Ventricle: Left ventricular ejection fraction, by estimation, is 55%. The left ventricle has normal function. The left ventricle has no  regional wall motion abnormalities. The left ventricular internal cavity size was normal in size. There is no  left ventricular hypertrophy. Left ventricular diastolic parameters were normal.   Right Ventricle: The right ventricular size is normal. No increase in right ventricular wall thickness. Right ventricular systolic function is  normal. Tricuspid regurgitation signal is inadequate for assessing PA pressure.   Left Atrium: Left atrial size was normal in size.   Right Atrium: Right atrial size was normal in size.   Pericardium: There is no evidence of pericardial effusion.   Mitral Valve: The mitral valve is normal in structure. Trivial mitral  valve regurgitation. No evidence of mitral valve stenosis.   Tricuspid Valve: The tricuspid valve is normal in structure. Tricuspid  valve regurgitation is trivial. No evidence of tricuspid stenosis.   Aortic Valve: The aortic valve is grossly normal. Aortic valve  regurgitation is not visualized. No aortic stenosis is present.   Pulmonic Valve: The pulmonic valve was not well visualized. Pulmonic valve  regurgitation is not visualized. No evidence of  pulmonic stenosis.   Aorta: Aortic dilatation noted. There is mild dilatation of the aortic  root, measuring 41 mm.   Venous: The inferior vena cava is normal in size with greater than 50% respiratory variability, suggesting right atrial pressure of 3 mmHg.   IAS/Shunts: No atrial level shunt detected by color flow Doppler.      Recent Labs: 08/29/2020: TSH 0.600 12/27/2020: ALT 15 02/07/2021: BUN 10; Creatinine,  Ser 0.89; Hemoglobin 15.5; Platelets 209; Potassium 4.3; Sodium 140  Recent Lipid Panel    Component Value Date/Time   CHOL 205 (H) 07/26/2020 1131   CHOL 203 (H) 11/03/2019 1052   TRIG 178.0 (H) 07/26/2020 1131   HDL 75.80 07/26/2020 1131   HDL 65 11/03/2019 1052   CHOLHDL 3 07/26/2020 1131   VLDL 35.6 07/26/2020 1131   LDLCALC 94 07/26/2020 1131   LDLCALC 121 (H) 11/03/2019 1052    Physical Exam:    VS:  BP (!) 142/80    Pulse 74    Ht 6\' 3"  (1.905 m)    Wt 191 lb 6.4 oz (86.8 kg)    SpO2 99%    BMI 23.92 kg/m     Wt Readings from Last 3 Encounters:  02/13/21 191 lb 6.4 oz (86.8 kg)  02/08/21 187 lb 14.4 oz (85.2 kg)  01/06/21 183 lb 9.6 oz (83.3 kg)     GEN: Well nourished, well developed in no acute distress HEENT: Normal NECK: No JVD; No carotid bruits LYMPHATICS: No lymphadenopathy CARDIAC: S1S2 noted,RRR, no murmurs, rubs, gallops RESPIRATORY:  Clear to auscultation without rales, wheezing or rhonchi  ABDOMEN: Soft, non-tender, non-distended, +bowel sounds, no guarding. EXTREMITIES: No edema, No cyanosis, no clubbing MUSCULOSKELETAL:  No deformity  SKIN: Warm and dry NEUROLOGIC:  Alert and oriented x 3, non-focal PSYCHIATRIC:  Normal affect, good insight  ASSESSMENT:    1. Minimal CAD   2. Ascending aorta dilatation (HCC)   3. Paroxysmal SVT (supraventricular tachycardia) (HCC)   4. Tobacco abuse   5. Mixed hyperlipidemia   6. Generalized anxiety disorder    PLAN:    Thankfully symptoms have chest pain shortness of breath has improved which  was related to his COPD exacerbation.  No anginal symptoms for his minimal CAD.  Will discuss his new echocardiogram result and advised the patient the proximal ascending aneurysm size has not increased creased. In terms of the distal SVT given his intolerance to the beta-blocker which was significant fatigue and low heart rate on a minimal dose of carvedilol we will hold now for now.  Especially fluid.  I do suspect that his anxiety is playing a role.  He is follow-up with his PCP he needs likely adjustments to his edema anxiety medication.  Of also has the patient to take his blood pressure daily and send me that information in 2 weeks.  Smoking cessation advised   The patient is in agreement with the above plan. The patient left the office in stable condition.  The patient will follow up in 6 months or sooner if needed     Medication Adjustments/Labs and Tests Ordered: Current medicines are reviewed at length with the patient today.  Concerns regarding medicines are outlined above.  No orders of the defined types were placed in this encounter.  No orders of the defined types were placed in this encounter.   Patient Instructions  Medication Instructions:  Your physician recommends that you continue on your current medications as directed. Please refer to the Current Medication list given to you today.  *If you need a refill on your cardiac medications before your next appointment, please call your pharmacy*   Follow-Up: At [facility], you and your health needs are our priority.  As part of our continuing mission to provide you with exceptional heart care, we have created designated Provider Care Teams.  These Care Teams include your primary Cardiologist (physician) and Advanced Practice Providers (APPs -  Physician  Assistants and Nurse Practitioners) who all work together to provide you with the care you need, when you need it.  We recommend signing up for the patient portal  called "MyChart".  Sign up information is provided on this After Visit Summary.  MyChart is used to connect with patients for Virtual Visits (Telemedicine).  Patients are able to view lab/test results, encounter notes, upcoming appointments, etc.  Non-urgent messages can be sent to your provider as well.   To learn more about what you can do with MyChart, go to [email or web address].    Your next appointment:   6 month(s)  The format for your next appointment:   In Person  Provider:   Berniece Salines, DO   Adopting a Healthy Lifestyle.  Know what a healthy weight is for you (roughly BMI <25) and aim to maintain this   Aim for 7+ servings of fruits and vegetables daily   65-80+ fluid ounces of water or unsweet tea for healthy kidneys   Limit to max 1 drink of alcohol per day; avoid smoking/tobacco   Limit animal fats in diet for cholesterol and heart health - choose grass fed whenever available   Avoid highly processed foods, and foods high in saturated/trans fats   Aim for low stress - take time to unwind and care for your mental health   Aim for 150 min of moderate intensity exercise weekly for heart health, and weights twice weekly for bone health   Aim for 7-9 hours of sleep daily   When it comes to diets, agreement about the perfect plan isnt easy to find, even among the experts. Experts at the [employer] developed an idea known as the Healthy Eating Plate. Just imagine a plate divided into logical, healthy portions.   The emphasis is on diet quality:   Load up on vegetables and fruits - one-half of your plate: Aim for color and variety, and remember that potatoes dont count.   Go for whole grains - one-quarter of your plate: Whole wheat, barley, wheat berries, quinoa, oats, brown rice, and foods made with them. If you want pasta, go with whole wheat pasta.   Protein power - one-quarter of your plate: Fish, chicken, beans, and nuts are all healthy,  versatile protein sources. Limit red meat.   The diet, however, does go beyond the plate, offering a few other suggestions.   Use healthy plant oils, such as olive, canola, soy, corn, sunflower and peanut. Check the labels, and avoid partially hydrogenated oil, which have unhealthy trans fats.   If youre thirsty, drink water. Coffee and tea are good in moderation, but skip sugary drinks and limit milk and dairy products to one or two daily servings.   The type of carbohydrate in the diet is more important than the amount. Some sources of carbohydrates, such as vegetables, fruits, whole grains, and beans-are healthier than others.   Finally, stay active  Signed, Berniece Salines, DO  02/13/2021 9:09 AM    [place]

## 2021-02-16 NOTE — Progress Notes (Signed)
[place] [facility] [address] [place], [place] [address] [phone]  Date:  02/19/2021   Name:  Stuart Hill   DOB:  06/14/1970   MRN:  [id number]  PCP:  Darreld Mclean, MD    Chief Complaint: Hospitalization Follow-up (02/07/21: chest pain/Concerns/ questions: Cardiologists was  considering adding a new med for Anxiety.  2. Asks about Pulmonology referral?/)   History of Present Illness:  Stuart Hill is a 51 y.o. very pleasant male patient who presents with the following:  Patient seen today for a follow-up visit Most recent visit with myself was in November He was admitted to the hospital earlier this month following an overnight stay for chest pain evaluation.  Thankfully no cardiac issue was discovered, potential cause COPD exacerbation  Admit date: 02/07/2021 Discharge date: 02/08/2021 Discharge Diagnoses:  Principal Problem:   Chest pain Active Problems:   Generalized anxiety disorder   Other hyperlipidemia   Minimal CAD   Paroxysmal SVT (supraventricular tachycardia) (HCC)   Emphysema of lung [name])   Hospital Course: 51 year old male presents emergency department with shortness of breath and chest pain.  Patient has been having symptoms for weeks and thinks it is due to his aneurysm that he has.  Denies any ankle swelling.  Patient was treated for COPD exacerbation and placed on prednisone and felt better.  He is also placed on albuterol nebulizers.  He had serial cardiac enzymes which were all negative.  Cardiac echo also unrevealing and normal.  Cardiology was consulted recommending holding his beta-blocker because he was slightly bradycardic.  Recommending continue prednisone.  Did not think patient had any symptoms or signs of pericarditis or his symptoms were due to underlying cardiology origin.  Suspect maybe he has component of COPD.  He is not formally been evaluated for this.  Patient will need to be  formally evaluated with PFTs with his primary care physician.  Patient also has several questions concerning surgical intervention for his aneurysm which is less than 5 cm.  I will also defer this surgical evaluation to his primary care physician as patient very adamant that he wants this surgically repaired so that he can function at work normally.  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  Patient being discharged home in stable and improved condition with a prednisone taper.  His cardiologist is Dr. Jorene Minors who he is to see also in approximately 2 weeks.  His Coreg has been stopped again secondary to cardiology recommendations for slightly low heart rate in the 40s.  Suspect patient has a component of anxiety contributing to his symptoms  He has already followed up with cardiology, Dr. Harriet Masson on the 19th  1. Minimal CAD   2. Ascending aorta dilatation (HCC)   3. Paroxysmal SVT (supraventricular tachycardia) (HCC)   4. Tobacco abuse   5. Mixed hyperlipidemia   6. Generalized anxiety disorder     PLAN:     Thankfully symptoms have chest pain shortness of breath has improved which was related to his COPD exacerbation.  No anginal symptoms for his minimal CAD.  Will discuss his new echocardiogram result and advised the patient the proximal ascending aneurysm size has not increased creased. In terms of the distal SVT given his intolerance to the beta-blocker which was significant fatigue and low heart rate on a minimal dose of carvedilol we will hold  now for now.  Especially fluid.  I do suspect that his anxiety is playing a role.  He is follow-up with his PCP he needs likely adjustments to his anxiety medication. Of also has the patient to take his blood pressure daily and send me that information in 2 weeks. Smoking cessation advised  Patient has declined routine immunizations including covid 19 and flu Tetanus vaccine Colon cancer  screening-difficult as patient is self-pay  He is still on 50 mg of pred so he is feeling pretty well right now; his breathing is excellent while on steroids He did try Spiriva in the past but it was too expensive  We discussed having him see pulmonology but he declines right now.  Paying for inhalers is also challenging  We discussed his significant history of anxiety-[name] has had a rather difficult life, he describes seeing family members die right in front of him, several for members who have died young.  His wife Stuart Hill is with him today, she is supportive.  She would like called to come with her to talk with her therapist which I encouraged  I encouraged him to start on an SSRI or SNRI- for now he declines  He has not felt that these medication have helped him- he states he has tried several in the past  He notes that his father has taken diazepam and did well with this; he wonders if he could change his alprazolam to diazepam.  I advised that we can certainly give this a try, we will start with a 1 week supply as a trial  Patient Active Problem List   Diagnosis Date Noted   Paroxysmal SVT (supraventricular tachycardia) ([place])    Emphysema of lung ([place])    Palpitations 01/09/2021   Minimal CAD 01/09/2021   Mixed hyperlipidemia 01/09/2021   Ascending aorta dilatation ([place]) 01/09/2021   Allergy 08/19/2020   Anxiety 08/19/2020   Arthritis 08/19/2020   Bell's palsy 08/19/2020   Chest pain 07/26/2020   Family history of early CAD 07/26/2020   DOE (dyspnea on exertion) 12/14/2019   Angina pectoris ([place]) 11/03/2019   Tobacco abuse 11/03/2019   Other hyperlipidemia 11/03/2019   Tonsillar hypertrophy 04/30/2017   Obstructive sleep apnea 04/23/2017   Polycythemia, secondary 09/29/2016   Polycythemia vera ([place]) 05/03/2015   Hemoptysis 04/03/2015   Condylomata acuminata 01/18/2015   Generalized anxiety disorder 03/05/2014    Past Medical History:  Diagnosis Date   Allergy     Anxiety    Arthritis    Bell's palsy    Erythrocytosis 04/10/2015   Hyperlipidemia    Polycythemia vera ([place]) 05/03/2015    Past Surgical History:  Procedure Laterality Date   DENTAL SURGERY     HAND SURGERY     TONSILLECTOMY Bilateral 04/30/2017   Procedure: TONSILLECTOMY;  Surgeon: Jerrell Belfast, MD;  Location: [place];  Service: ENT;  Laterality: Bilateral;    Social History   Tobacco Use   Smoking status: Every Day    Packs/day: 0.25    Years: 24.00    Pack years: 6.00    Types: Cigarettes   Smokeless tobacco: Never  Vaping Use   Vaping Use: Never used  Substance Use Topics   Alcohol use: Yes    Alcohol/week: 1.0 standard drink    Types: 1 Cans of beer per week    Comment: 6-10 beer a week   Drug use: Yes    Types: Other-see comments, Marijuana    Comment: Marijuana every other day.  Family History  Problem Relation Age of Onset   COPD Mother    Fibromyalgia Mother    Heart disease Father    Stroke Father    Depression Brother    Heart disease Brother    Heart disease Maternal Grandmother    Stroke Maternal Grandmother    Heart disease Maternal Grandfather    Stroke Maternal Grandfather    Heart disease Paternal Grandmother    Stroke Paternal Grandmother    Heart disease Paternal Grandfather    Stroke Paternal Grandfather    Stroke Paternal Aunt     Allergies  Allergen Reactions   Plavix [Clopidogrel Bisulfate] Other (See Comments)    Confusion, lethargy, slurred speech   Afrin [Nasal Spray] Hives   Antihistamines, Chlorpheniramine-Type Hives   Benadryl [Diphenhydramine Hcl] Hives   Buprenorphine Hcl Hives   Carisoprodol Hives   Codeine Hives   Metoclopramide Hives   Morphine And Related Hives   Oxymetazoline Hives   Pheniramine Hives and Rash    Medication list has been reviewed and updated.  Current Outpatient Medications on File Prior to Visit  Medication Sig Dispense Refill   albuterol (VENTOLIN HFA) 108 (90 Base)  MCG/ACT inhaler Inhale 1-2 puffs into the lungs every 6 (six) hours as needed for wheezing or shortness of breath. 18 g 3   ALPRAZolam (XANAX) 1 MG tablet TAKE 1 TABLET BY MOUTH THREE TIMES A DAY AS NEEDED FOR ANXIETY (Patient taking differently: Take 1 mg by mouth 3 (three) times daily. Take 1 (1 mg) tablet in morning and 2 (2 mg) tablets at bedtime.) 90 tablet 2   aspirin EC 81 MG tablet Take 162 mg by mouth at bedtime. Swallow whole.     Esomeprazole Magnesium (NEXIUM PO) Take 1 tablet by mouth as needed (acid reflux).     losartan (COZAAR) 25 MG tablet Take 1 tablet (25 mg total) by mouth daily. 90 tablet 3   nitroGLYCERIN (NITROSTAT) 0.4 MG SL tablet Place 1 tablet (0.4 mg total) under the tongue every 5 (five) minutes as needed for chest pain. 25 tablet 6   predniSONE (DELTASONE) 50 MG tablet Take 1 tablet (50 mg total) by mouth daily with breakfast. 5 tablet 0   rosuvastatin (CRESTOR) 10 MG tablet TAKE 1 TABLET BY MOUTH ONCE DAILY AT BEDTIME (Patient taking differently: Take 10 mg by mouth at bedtime.) 90 tablet 1   Tetrahydrozoline HCl (VISINE OP) Apply 1 drop to eye as needed (itchy, dryness).     traMADol (ULTRAM) 50 MG tablet TAKE 1 BY MOUTH DAILY AS NEEDED FOR BACK PAIN 30 tablet 0   Vitamin D, Ergocalciferol, (DRISDOL) 1.25 MG (50000 UNIT) CAPS capsule Take 1 capsule (50,000 Units total) by mouth every 7 (seven) days. 12 capsule 0   No current facility-administered medications on file prior to visit.    Review of Systems:  As per HPI- otherwise negative.   Physical Examination: Vitals:   02/19/21 1049  BP: 123/80  Pulse: (!) 57  Resp: 18  Temp: 97.7 F (36.5 C)  SpO2: 98%   Vitals:   02/19/21 1049  Weight: 195 lb 6.4 oz (88.6 kg)  Height: 6\' 3"  (1.905 m)   Body mass index is 24.42 kg/m. Ideal Body Weight: Weight in (lb) to have BMI = 25: 199.6  GEN: no acute distress.  Tall build, normal weight HEENT: Atraumatic, Normocephalic.  Ears and Nose: No external  deformity. CV: RRR, No M/G/R. No JVD. No thrill. No extra heart sounds. PULM: CTA B,  no wheezes, crackles, rhonchi. No retractions. No resp. distress. No accessory muscle use. EXTR: No c/c/e PSYCH: Normally interactive. Conversant.    Assessment and Plan: Generalized anxiety disorder - Plan: diazepam (VALIUM) 5 MG tablet  Labile blood pressure  Chest pain, unspecified type - Plan: nitroGLYCERIN (NITROSTAT) 0.4 MG SL tablet  Family history of early CAD  Following up today.  [place] has a challenging situation, he is bothered by frequent chest pains and shortness of breath.  Treatment and work-up can be limited by financial concerns COPD likely a factor.  Unfortunately he is still smoking.  He knows that he should not smoke  We will have him try using diazepam instead of alprazolam, we wonder if this may give him more satisfactory control of his anxiety I have asked him to try this change and let me know how it works for him Use caution during change over as sedation may be increased Avoid combining with alcohol Encouraged him to see a counselor as his wife has suggested Refilled nitroglycerin  Signed Lamar Blinks, MD

## 2021-02-17 ENCOUNTER — Encounter: Payer: Self-pay | Admitting: Cardiology

## 2021-02-18 MED ORDER — LOSARTAN POTASSIUM 25 MG PO TABS: 25.0000 mg | ORAL_TABLET | Freq: Every day | ORAL | 3 refills | Status: DC

## 2021-02-19 ENCOUNTER — Ambulatory Visit (INDEPENDENT_AMBULATORY_CARE_PROVIDER_SITE_OTHER): Payer: Self-pay | Admitting: Family Medicine

## 2021-02-19 VITALS — BP 123/80 | HR 57 | Temp 97.7°F | Resp 18 | Ht 75.0 in | Wt 195.4 lb

## 2021-02-19 DIAGNOSIS — R0989 Other specified symptoms and signs involving the circulatory and respiratory systems: Secondary | ICD-10-CM

## 2021-02-19 DIAGNOSIS — Z8249 Family history of ischemic heart disease and other diseases of the circulatory system: Secondary | ICD-10-CM

## 2021-02-19 DIAGNOSIS — R079 Chest pain, unspecified: Secondary | ICD-10-CM

## 2021-02-19 DIAGNOSIS — F411 Generalized anxiety disorder: Secondary | ICD-10-CM

## 2021-02-19 MED ORDER — DIAZEPAM 5 MG PO TABS: 5.0000 mg | ORAL_TABLET | Freq: Three times a day (TID) | ORAL | 0 refills | Status: DC | PRN

## 2021-02-19 MED ORDER — NITROGLYCERIN 0.4 MG SL SUBL: 0.4000 mg | SUBLINGUAL_TABLET | SUBLINGUAL | 6 refills | Status: DC | PRN

## 2021-02-19 NOTE — Patient Instructions (Signed)
Good to see you today - I am sorry things have been so hard I sent in a refill of your nitroglycerin and also a week of diazepam/ valium for you to try INSTEAD of xanax.  However, if you don't do well you can change back to xanax at any time  Please keep me posted You are overdue for colon cancer screening- I am glad to order a colonoscopy or Cologard kit if you like

## 2021-02-21 ENCOUNTER — Encounter: Payer: Self-pay | Admitting: Family

## 2021-02-21 ENCOUNTER — Inpatient Hospital Stay: Payer: Self-pay

## 2021-02-21 ENCOUNTER — Inpatient Hospital Stay (HOSPITAL_BASED_OUTPATIENT_CLINIC_OR_DEPARTMENT_OTHER): Payer: Self-pay | Admitting: Family

## 2021-02-21 ENCOUNTER — Other Ambulatory Visit: Payer: Self-pay

## 2021-02-21 ENCOUNTER — Inpatient Hospital Stay: Payer: Self-pay | Attending: Family

## 2021-02-21 VITALS — BP 142/91 | HR 74 | Temp 98.0°F | Resp 16 | Ht 75.0 in | Wt 188.2 lb

## 2021-02-21 DIAGNOSIS — G510 Bell's palsy: Secondary | ICD-10-CM | POA: Insufficient documentation

## 2021-02-21 DIAGNOSIS — D751 Secondary polycythemia: Secondary | ICD-10-CM

## 2021-02-21 DIAGNOSIS — D500 Iron deficiency anemia secondary to blood loss (chronic): Secondary | ICD-10-CM

## 2021-02-21 DIAGNOSIS — Z7982 Long term (current) use of aspirin: Secondary | ICD-10-CM | POA: Insufficient documentation

## 2021-02-21 DIAGNOSIS — F17210 Nicotine dependence, cigarettes, uncomplicated: Secondary | ICD-10-CM | POA: Insufficient documentation

## 2021-02-21 LAB — CBC WITH DIFFERENTIAL (CANCER CENTER ONLY)
Abs Immature Granulocytes: 0.04 10*3/uL (ref 0.00–0.07)
Basophils Absolute: 0.1 10*3/uL (ref 0.0–0.1)
Basophils Relative: 1 %
Eosinophils Absolute: 0 10*3/uL (ref 0.0–0.5)
Eosinophils Relative: 0 %
HCT: 48.4 % (ref 39.0–52.0)
Hemoglobin: 16.3 g/dL (ref 13.0–17.0)
Immature Granulocytes: 0 %
Lymphocytes Relative: 14 %
Lymphs Abs: 1.8 10*3/uL (ref 0.7–4.0)
MCH: 30.2 pg (ref 26.0–34.0)
MCHC: 33.7 g/dL (ref 30.0–36.0)
MCV: 89.6 fL (ref 80.0–100.0)
Monocytes Absolute: 0.8 10*3/uL (ref 0.1–1.0)
Monocytes Relative: 6 %
Neutro Abs: 10.5 10*3/uL — ABNORMAL HIGH (ref 1.7–7.7)
Neutrophils Relative %: 79 %
Platelet Count: 264 10*3/uL (ref 150–400)
RBC: 5.4 MIL/uL (ref 4.22–5.81)
RDW: 14.5 % (ref 11.5–15.5)
WBC Count: 13.3 10*3/uL — ABNORMAL HIGH (ref 4.0–10.5)
nRBC: 0 % (ref 0.0–0.2)

## 2021-02-21 LAB — CMP (CANCER CENTER ONLY)
ALT: 28 U/L (ref 0–44)
AST: 29 U/L (ref 15–41)
Albumin: 4.5 g/dL (ref 3.5–5.0)
Alkaline Phosphatase: 54 U/L (ref 38–126)
Anion gap: 8 (ref 5–15)
BUN: 10 mg/dL (ref 6–20)
CO2: 29 mmol/L (ref 22–32)
Calcium: 9.6 mg/dL (ref 8.9–10.3)
Chloride: 101 mmol/L (ref 98–111)
Creatinine: 0.82 mg/dL (ref 0.61–1.24)
GFR, Estimated: >60 mL/min (ref >60)
Glucose, Bld: 90 mg/dL (ref 70–99)
Potassium: 4 mmol/L (ref 3.5–5.1)
Sodium: 138 mmol/L (ref 135–145)
Total Bilirubin: 0.7 mg/dL (ref 0.3–1.2)
Total Protein: 7.1 g/dL (ref 6.5–8.1)

## 2021-02-21 LAB — IRON AND IRON BINDING CAPACITY (CC-WL,HP ONLY)
Iron: 109 ug/dL (ref 45–182)
Saturation Ratios: 22 % (ref 17.9–39.5)
TIBC: 497 ug/dL — ABNORMAL HIGH (ref 250–450)
UIBC: 388 ug/dL — ABNORMAL HIGH (ref 117–376)

## 2021-02-21 LAB — FERRITIN: Ferritin: 24 ng/mL (ref 24–336)

## 2021-02-21 NOTE — Patient Instructions (Signed)
Therapeutic Phlebotomy Therapeutic phlebotomy is the planned removal of blood from a person's body for the purpose of treating a medical condition. The procedure is similar to donating blood. Usually, about a pint (470 mL, or 0.47 L) of blood is removed. The average adult has 9-12 pints (4.3-5.7 L) of blood in the body. Therapeutic phlebotomy may be used to treat the following medical conditions: Hemochromatosis. This is a condition in which the blood contains too much iron. Polycythemia vera. This is a condition in which the blood contains too many red blood cells. Porphyria cutanea tarda. This is a disease in which an important part of hemoglobin is not made properly. It results in the buildup of abnormal amounts of porphyrins in the body. Sickle cell disease. This is a condition in which the red blood cells form an abnormal crescent shape rather than a round shape. Tell a health care provider about: Any allergies you have. All medicines you are taking, including vitamins, herbs, eye drops, creams, and over-the-counter medicines. Any problems you or family members have had with anesthetic medicines. Any blood disorders you have. Any surgeries you have had. Any medical conditions you have. Whether you are pregnant or may be pregnant. What are the risks? Generally, this is a safe procedure. However, problems may occur, including: Nausea or light-headedness. Low blood pressure (hypotension). Soreness, bleeding, swelling, or bruising at the needle insertion site. Infection. What happens before the procedure? Follow instructions from your health care provider about eating or drinking restrictions. Ask your health care provider about: Changing or stopping your regular medicines. This is especially important if you are taking diabetes medicines or blood thinners (anticoagulants). Taking medicines such as aspirin and ibuprofen. These medicines can thin your blood. Do not take these medicines  unless your health care provider tells you to take them. Taking over-the-counter medicines, vitamins, herbs, and supplements. Wear clothing with sleeves that can be raised above the elbow. Plan to have someone take you home from the hospital or clinic. You may have a blood sample taken. Your blood pressure, pulse rate, and breathing rate will be measured. What happens during the procedure?  To lower your risk of infection: Your health care team will wash or sanitize their hands. Your skin will be cleaned with an antiseptic. You may be given a medicine to numb the area (local anesthetic). A tourniquet will be placed on your arm. A needle will be inserted into one of your veins. Tubing and a collection bag will be attached to that needle. Blood will flow through the needle and tubing into the collection bag. The collection bag will be placed lower than your arm to allow gravity to help the flow of blood into the bag. You may be asked to open and close your hand slowly and continually during the entire collection. After the specified amount of blood has been removed from your body, the collection bag and tubing will be clamped. The needle will be removed from your vein. Pressure will be held on the site of the needle insertion to stop the bleeding. A bandage (dressing) will be placed over the needle insertion site. The procedure may vary among health care providers and hospitals. What happens after the procedure? Your blood pressure, pulse rate, and breathing rate will be measured after the procedure. You will be encouraged to drink fluids. Your recovery will be assessed and monitored. You can return to your normal activities as told by your health care provider. Summary Therapeutic phlebotomy is the planned removal   of blood from a person's body for the purpose of treating a medical condition. Therapeutic phlebotomy may be used to treat hemochromatosis, polycythemia vera, porphyria cutanea  tarda, or sickle cell disease. In the procedure, a needle is inserted and about a pint (470 mL, or 0.47 L) of blood is removed. The average adult has 9-12 pints (4.3-5.7 L) of blood in the body. This is generally a safe procedure, but it can sometimes cause problems such as nausea, light-headedness, or low blood pressure (hypotension). This information is not intended to replace advice given to you by your health care provider. Make sure you discuss any questions you have with your health care provider. Document Revised: 04/23/2020 Document Reviewed: 01/28/2017 Elsevier Patient Education  2022 Elsevier Inc.  

## 2021-02-21 NOTE — Progress Notes (Signed)
Stuart Hill presents today for phlebotomy per MD orders. Phlebotomy procedure started at 11:44 AM and ended at 11:49 AM 536 grams removed via 18 gauge needle to right AC.  Patient observed for 30 minutes after procedure without any incident. Patient tolerated procedure well and understands to call if he has any questions or concerns post discharge.

## 2021-02-21 NOTE — Progress Notes (Signed)
Hematology and Oncology Follow Up Visit  VENCE LALOR [id number] December 15, 1970 51 y.o. 02/21/2021   Principle Diagnosis:  Secondary polycythemia JAK2 (-), smoker  LEFT Bell's palsy   Current Therapy:        Phlebotomy to maintain hematocrit below 45% 2 baby aspirin daily at bedtime - not taking per patient   Interim History:  Mr. Stansbury is here today for follow-up. He is feeling fatigued and states that he went to the ED last week for chest pain and was found to have inflammation in the lungs. He has been a steroid dose pack.  He states that his cardiac work up was stable at that his palpitations are felt to be due to anxiety so he recently started taking Valium.  No fever, chills, cough, rash, dizziness, abdominal pain or changes in bowel or bladder habits. No blood loss, bruising or petechiae.   SOB with exertion.  He has GERD and notes bloating as well as n/v in the am after eating with some food. He has stopped eating 4 hours before bedtime.  No swelling, tenderness in his extremities at this time.  No falls or syncope.  He has maintained a good appetite and is doing his best to stay well hydrated. His weight is 188 lbs.   ECOG Performance Status: 1 - Symptomatic but completely ambulatory  Medications:  Allergies as of 02/21/2021       Reactions   Plavix [clopidogrel Bisulfate] Other (See Comments)   Confusion, lethargy, slurred speech   Afrin [nasal Spray] Hives   Antihistamines, Chlorpheniramine-type Hives   Benadryl [diphenhydramine Hcl] Hives   Buprenorphine Hcl Hives   Carisoprodol Hives   Codeine Hives   Metoclopramide Hives   Morphine And Related Hives   Oxymetazoline Hives   Pheniramine Hives, Rash        Medication List        Accurate as of February 21, 2021 12:13 PM. If you have any questions, ask your nurse or doctor.          albuterol 108 (90 Base) MCG/ACT inhaler Commonly known as: VENTOLIN HFA Inhale 1-2 puffs into the lungs every 6  (six) hours as needed for wheezing or shortness of breath.   ALPRAZolam 1 MG tablet Commonly known as: XANAX TAKE 1 TABLET BY MOUTH THREE TIMES A DAY AS NEEDED FOR ANXIETY What changed: See the new instructions.   aspirin EC 81 MG tablet Take 162 mg by mouth at bedtime. Swallow whole.   diazepam 5 MG tablet Commonly known as: VALIUM Take 1 tablet (5 mg total) by mouth every 8 (eight) hours as needed for anxiety.   losartan 25 MG tablet Commonly known as: COZAAR Take 1 tablet (25 mg total) by mouth daily.   NEXIUM PO Take 1 tablet by mouth as needed (acid reflux).   nitroGLYCERIN 0.4 MG SL tablet Commonly known as: NITROSTAT Place 1 tablet (0.4 mg total) under the tongue every 5 (five) minutes as needed for chest pain.   predniSONE 50 MG tablet Commonly known as: DELTASONE Take 1 tablet (50 mg total) by mouth daily with breakfast.   rosuvastatin 10 MG tablet Commonly known as: CRESTOR TAKE 1 TABLET BY MOUTH ONCE DAILY AT BEDTIME   traMADol 50 MG tablet Commonly known as: ULTRAM TAKE 1 BY MOUTH DAILY AS NEEDED FOR BACK PAIN   VISINE OP Apply 1 drop to eye as needed (itchy, dryness).   Vitamin D (Ergocalciferol) 1.25 MG (50000 UNIT) Caps capsule Commonly known as: DRISDOL  Take 1 capsule (50,000 Units total) by mouth every 7 (seven) days.        Allergies:  Allergies  Allergen Reactions   Plavix [Clopidogrel Bisulfate] Other (See Comments)    Confusion, lethargy, slurred speech   Afrin [Nasal Spray] Hives   Antihistamines, Chlorpheniramine-Type Hives   Benadryl [Diphenhydramine Hcl] Hives   Buprenorphine Hcl Hives   Carisoprodol Hives   Codeine Hives   Metoclopramide Hives   Morphine And Related Hives   Oxymetazoline Hives   Pheniramine Hives and Rash    Past Medical History, Surgical history, Social history, and Family History were reviewed and updated.  Review of Systems: All other 10 point review of systems is negative.   Physical Exam:  height is  6\' 3"  (1.905 m) and weight is 188 lb 4 oz (85.4 kg). His oral temperature is 98 F (36.7 C). His blood pressure is 142/91 (abnormal) and his pulse is 74. His respiration is 16 and oxygen saturation is 98%.   Wt Readings from Last 3 Encounters:  02/21/21 188 lb 4 oz (85.4 kg)  02/19/21 195 lb 6.4 oz (88.6 kg)  02/13/21 191 lb 6.4 oz (86.8 kg)    Ocular: Sclerae unicteric, pupils equal, round and reactive to light Ear-nose-throat: Oropharynx clear, dentition fair Lymphatic: No cervical or supraclavicular adenopathy Lungs no rales or rhonchi, good excursion bilaterally Heart regular rate and rhythm, no murmur appreciated Abd soft, nontender, positive bowel sounds MSK no focal spinal tenderness, no joint edema Neuro: non-focal, well-oriented, appropriate affect Breasts: Deferred   Lab Results  Component Value Date   WBC 13.3 (H) 02/21/2021   HGB 16.3 02/21/2021   HCT 48.4 02/21/2021   MCV 89.6 02/21/2021   PLT 264 02/21/2021   Lab Results  Component Value Date   FERRITIN 12 (L) 12/27/2020   IRON 74 12/27/2020   TIBC 436 (H) 12/27/2020   UIBC 363 12/27/2020   IRONPCTSAT 17 (L) 12/27/2020   Lab Results  Component Value Date   RETICCTPCT 1.2 10/23/2020   RBC 5.40 02/21/2021   No results found for: KPAFRELGTCHN, LAMBDASER, KAPLAMBRATIO No results found for: IGGSERUM, IGA, IGMSERUM No results found for: Kathrynn Ducking, MSPIKE, SPEI   Chemistry      Component Value Date/Time   NA 138 02/21/2021 1032   NA 143 01/01/2017 1453   NA 139 11/12/2015 0945   K 4.0 02/21/2021 1032   K 4.3 01/01/2017 1453   K 3.7 11/12/2015 0945   CL 101 02/21/2021 1032   CL 103 01/01/2017 1453   CO2 29 02/21/2021 1032   CO2 27 01/01/2017 1453   CO2 22 11/12/2015 0945   BUN 10 02/21/2021 1032   BUN 18 01/01/2017 1453   BUN 13.1 11/12/2015 0945   CREATININE 0.82 02/21/2021 1032   CREATININE 0.96 10/05/2019 1042   CREATININE 0.9 11/12/2015 0945       Component Value Date/Time   CALCIUM 9.6 02/21/2021 1032   CALCIUM 8.8 01/01/2017 1453   CALCIUM 8.9 11/12/2015 0945   ALKPHOS 54 02/21/2021 1032   ALKPHOS 44 01/01/2017 1453   ALKPHOS 66 11/12/2015 0945   AST 29 02/21/2021 1032   AST 13 11/12/2015 0945   ALT 28 02/21/2021 1032   ALT 368 (HH) 01/01/2017 1453   ALT 14 11/12/2015 0945   BILITOT 0.7 02/21/2021 1032   BILITOT <0.22 11/12/2015 0945       Impression and Plan: Mr. Urieta is a 51 yo caucasian male with history of  secondary polycythemia, JAK2 negative, smoker.  We will proceed with phlebotomy today, Hct 48.4%.  Lab check and phlebotomy every 8 weeks, follow-up in 6 months.   Lottie Dawson, NP 1/27/202312:13 PM

## 2021-02-24 ENCOUNTER — Telehealth: Payer: Self-pay | Admitting: *Deleted

## 2021-02-24 NOTE — Telephone Encounter (Signed)
Per 02/21/21 los - called and gave upcoming appointments - confirmed

## 2021-02-25 ENCOUNTER — Encounter: Payer: Self-pay | Admitting: Family Medicine

## 2021-02-25 DIAGNOSIS — J439 Emphysema, unspecified: Secondary | ICD-10-CM

## 2021-02-25 DIAGNOSIS — R0609 Other forms of dyspnea: Secondary | ICD-10-CM

## 2021-03-31 ENCOUNTER — Other Ambulatory Visit: Payer: Self-pay | Admitting: Family Medicine

## 2021-03-31 DIAGNOSIS — F411 Generalized anxiety disorder: Secondary | ICD-10-CM

## 2021-04-17 ENCOUNTER — Institutional Professional Consult (permissible substitution): Payer: Self-pay | Admitting: Internal Medicine

## 2021-04-18 ENCOUNTER — Other Ambulatory Visit: Payer: Self-pay

## 2021-04-18 ENCOUNTER — Inpatient Hospital Stay: Payer: Self-pay | Attending: Family

## 2021-04-18 ENCOUNTER — Telehealth: Payer: Self-pay

## 2021-04-18 ENCOUNTER — Encounter: Payer: Self-pay | Admitting: Cardiology

## 2021-04-18 ENCOUNTER — Inpatient Hospital Stay: Payer: Self-pay

## 2021-04-18 DIAGNOSIS — D751 Secondary polycythemia: Secondary | ICD-10-CM | POA: Insufficient documentation

## 2021-04-18 DIAGNOSIS — D500 Iron deficiency anemia secondary to blood loss (chronic): Secondary | ICD-10-CM

## 2021-04-18 LAB — CMP (CANCER CENTER ONLY)
ALT: 15 U/L (ref 0–44)
AST: 17 U/L (ref 15–41)
Albumin: 4.2 g/dL (ref 3.5–5.0)
Alkaline Phosphatase: 47 U/L (ref 38–126)
Anion gap: 5 (ref 5–15)
BUN: 14 mg/dL (ref 6–20)
CO2: 28 mmol/L (ref 22–32)
Calcium: 9 mg/dL (ref 8.9–10.3)
Chloride: 105 mmol/L (ref 98–111)
Creatinine: 0.9 mg/dL (ref 0.61–1.24)
GFR, Estimated: >60 mL/min (ref >60)
Glucose, Bld: 80 mg/dL (ref 70–99)
Potassium: 3.8 mmol/L (ref 3.5–5.1)
Sodium: 138 mmol/L (ref 135–145)
Total Bilirubin: 0.7 mg/dL (ref 0.3–1.2)
Total Protein: 6.6 g/dL (ref 6.5–8.1)

## 2021-04-18 LAB — CBC WITH DIFFERENTIAL (CANCER CENTER ONLY)
Abs Immature Granulocytes: 0.06 10*3/uL (ref 0.00–0.07)
Basophils Absolute: 0.1 10*3/uL (ref 0.0–0.1)
Basophils Relative: 1 %
Eosinophils Absolute: 0.2 10*3/uL (ref 0.0–0.5)
Eosinophils Relative: 2 %
HCT: 43.9 % (ref 39.0–52.0)
Hemoglobin: 14.8 g/dL (ref 13.0–17.0)
Immature Granulocytes: 1 %
Lymphocytes Relative: 20 %
Lymphs Abs: 2.6 10*3/uL (ref 0.7–4.0)
MCH: 30.3 pg (ref 26.0–34.0)
MCHC: 33.7 g/dL (ref 30.0–36.0)
MCV: 90 fL (ref 80.0–100.0)
Monocytes Absolute: 0.9 10*3/uL (ref 0.1–1.0)
Monocytes Relative: 7 %
Neutro Abs: 9.4 10*3/uL — ABNORMAL HIGH (ref 1.7–7.7)
Neutrophils Relative %: 69 %
Platelet Count: 242 10*3/uL (ref 150–400)
RBC: 4.88 MIL/uL (ref 4.22–5.81)
RDW: 13.5 % (ref 11.5–15.5)
WBC Count: 13.3 10*3/uL — ABNORMAL HIGH (ref 4.0–10.5)
nRBC: 0 % (ref 0.0–0.2)

## 2021-04-18 LAB — IRON AND IRON BINDING CAPACITY (CC-WL,HP ONLY)
Iron: 123 ug/dL (ref 45–182)
Saturation Ratios: 28 % (ref 17.9–39.5)
TIBC: 442 ug/dL (ref 250–450)
UIBC: 319 ug/dL (ref 117–376)

## 2021-04-18 NOTE — Telephone Encounter (Signed)
Called patient left message on personal voice mail to call back to discuss heart flutters. ?

## 2021-04-18 NOTE — Telephone Encounter (Signed)
Pt here for phlebotomy. HCT 43.9 and pt to receive phlebotomy for hematocrit greater than 45. Reviewed pt labs and informed pt he did not meet criteria for phlebotomy. Pt verbalized understanding and had no further questions. Pt informed that if he starts to feel bad to call and we can schedule him for labs at any point. Pt verbalized understanding and left ambulatory in no apparent distress ?

## 2021-04-18 NOTE — Telephone Encounter (Signed)
Received a call back from patient's wife.She stated husband has been very anxious.He has been having frequent heart flutters.No chest pain. Advised to avoid caffeine.Appointment scheduled with Almyra Deforest PA 3/27 at 2:45 pm.Advised to bring a list of all medications. ?

## 2021-04-21 ENCOUNTER — Encounter: Payer: Self-pay | Admitting: Physician Assistant

## 2021-04-21 ENCOUNTER — Ambulatory Visit (INDEPENDENT_AMBULATORY_CARE_PROVIDER_SITE_OTHER): Payer: Self-pay | Admitting: Physician Assistant

## 2021-04-21 ENCOUNTER — Other Ambulatory Visit: Payer: Self-pay

## 2021-04-21 VITALS — BP 126/82 | HR 67 | Ht 75.0 in | Wt 189.4 lb

## 2021-04-21 DIAGNOSIS — E782 Mixed hyperlipidemia: Secondary | ICD-10-CM

## 2021-04-21 DIAGNOSIS — R002 Palpitations: Secondary | ICD-10-CM

## 2021-04-21 DIAGNOSIS — I7121 Aneurysm of the ascending aorta, without rupture: Secondary | ICD-10-CM

## 2021-04-21 DIAGNOSIS — I2510 Atherosclerotic heart disease of native coronary artery without angina pectoris: Secondary | ICD-10-CM

## 2021-04-21 LAB — FERRITIN: Ferritin: 17 ng/mL — ABNORMAL LOW (ref 24–336)

## 2021-04-21 NOTE — Patient Instructions (Addendum)
Medication Instructions:  ?Your physician recommends that you continue on your current medications as directed. Please refer to the Current Medication list given to you today. ? ?*If you need a refill on your cardiac medications before your next appointment, please call your pharmacy* ? ?Lab Work: ?Your physician recommends that you return for lab work TODAY:  ?TSH+T4F+T3Free ? ?If you have labs (blood work) drawn today and your tests are completely normal, you will receive your results only by: ?MyChart Message (if you have MyChart) OR ?A paper copy in the mail ?If you have any lab test that is abnormal or we need to change your treatment, we will call you to review the results. ? ?Testing/Procedures: ?NONE ordered at this time of appointment  ? ?Follow-Up: ?At [facility], you and your health needs are our priority.  As part of our continuing mission to provide you with exceptional heart care, we have created designated Provider Care Teams.  These Care Teams include your primary Cardiologist (physician) and Advanced Practice Providers (APPs -  Physician Assistants and Nurse Practitioners) who all work together to provide you with the care you need, when you need it.  ? ?Your next appointment:   ?6 week(s) ? ?The format for your next appointment:   ?In Person ? ?Provider:   ?Almyra Deforest, PA-C      ? ?Other Instructions ?CUT back on caffeine and alcohol consumption  ?MONITOR blood pressure at home. Take 2 blood pressure readings a day until next appointment. 1st and 2nd BP readings will need to be taken at the same times each day. (You can pick the times just need to be taken at those times every day)  ? ?Heart-Healthy Eating Plan ?Heart-healthy meal planning includes: ?Eating less unhealthy fats. ?Eating more healthy fats. ?Making other changes in your diet. ?Talk with your doctor or a diet specialist (dietitian) to create an eating plan that is right for you. ?What is my plan? ?Your doctor may recommend an eating  plan that includes: ?Total fat: ______% or less of total calories a day. ?Saturated fat: ______% or less of total calories a day. ?Cholesterol: less than _________mg a day. ?What are tips for following this plan? ?Cooking ?Avoid frying your food. Try to bake, boil, grill, or broil it instead. You can also reduce fat by: ?Removing the skin from poultry. ?Removing all visible fats from meats. ?Steaming vegetables in water or broth. ?Meal planning ? ?At meals, divide your plate into four equal parts: ?Fill one-half of your plate with vegetables and green salads. ?Fill one-fourth of your plate with whole grains. ?Fill one-fourth of your plate with lean protein foods. ?Eat 4-5 servings of vegetables per day. A serving of vegetables is: ?1 cup of raw or cooked vegetables. ?2 cups of raw leafy greens. ?Eat 4-5 servings of fruit per day. A serving of fruit is: ?1 medium whole fruit. ?? cup of dried fruit. ?? cup of fresh, frozen, or canned fruit. ?? cup of 100% fruit juice. ?Eat more foods that have soluble fiber. These are apples, broccoli, carrots, beans, peas, and barley. Try to get 20-30 g of fiber per day. ?Eat 4-5 servings of nuts, legumes, and seeds per week: ?1 serving of dried beans or legumes equals ? cup after being cooked. ?1 serving of nuts is ? cup. ?1 serving of seeds equals 1 tablespoon. ?General information ?Eat more home-cooked food. Eat less restaurant, buffet, and fast food. ?Limit or avoid alcohol. ?Limit foods that are high in starch and  sugar. ?Avoid fried foods. ?Lose weight if you are overweight. ?Keep track of how much salt (sodium) you eat. This is important if you have high blood pressure. Ask your doctor to tell you more about this. ?Try to add vegetarian meals each week. ?Fats ?Choose healthy fats. These include olive oil and canola oil, flaxseeds, walnuts, almonds, and seeds. ?Eat more omega-3 fats. These include salmon, mackerel, sardines, tuna, flaxseed oil, and ground flaxseeds. Try to eat  fish at least 2 times each week. ?Check food labels. Avoid foods with trans fats or high amounts of saturated fat. ?Limit saturated fats. ?These are often found in animal products, such as meats, butter, and cream. ?These are also found in plant foods, such as palm oil, palm kernel oil, and coconut oil. ?Avoid foods with partially hydrogenated oils in them. These have trans fats. Examples are stick margarine, some tub margarines, cookies, crackers, and other baked goods. ?What foods can I eat? ?Fruits ?All fresh, canned (in natural juice), or frozen fruits. ?Vegetables ?Fresh or frozen vegetables (raw, steamed, roasted, or grilled). Green salads. ?Grains ?Most grains. Choose whole wheat and whole grains most of the time. Rice and pasta, including brown rice and pastas made with whole wheat. ?Meats and other proteins ?Lean, well-trimmed beef, veal, pork, and lamb. Chicken and [place] without skin. All fish and shellfish. Wild duck, rabbit, pheasant, and venison. Egg whites or low-cholesterol egg substitutes. Dried beans, peas, lentils, and tofu. Seeds and most nuts. ?Dairy ?Low-fat or nonfat cheeses, including ricotta and mozzarella. Skim or 1% milk that is liquid, powdered, or evaporated. Buttermilk that is made with low-fat milk. Nonfat or low-fat yogurt. ?Fats and oils ?Non-hydrogenated (trans-free) margarines. Vegetable oils, including soybean, sesame, sunflower, olive, peanut, safflower, corn, canola, and cottonseed. Salad dressings or mayonnaise made with a vegetable oil. ?Beverages ?Mineral water. Coffee and tea. Diet carbonated beverages. ?Sweets and desserts ?Sherbet, gelatin, and fruit ice. Small amounts of dark chocolate. ?Limit all sweets and desserts. ?Seasonings and condiments ?All seasonings and condiments. ?The items listed above may not be a complete list of foods and drinks you can eat. Contact a dietitian for more options. ?What foods should I avoid? ?Fruits ?Canned fruit in heavy syrup. Fruit in  cream or butter sauce. Fried fruit. Limit coconut. ?Vegetables ?Vegetables cooked in cheese, cream, or butter sauce. Fried vegetables. ?Grains ?Breads that are made with saturated or trans fats, oils, or whole milk. Croissants. Sweet rolls. Donuts. High-fat crackers, such as cheese crackers. ?Meats and other proteins ?Fatty meats, such as hot dogs, ribs, sausage, bacon, rib-eye roast or steak. High-fat deli meats, such as salami and bologna. Caviar. Domestic duck and goose. Organ meats, such as liver. ?Dairy ?Cream, sour cream, cream cheese, and creamed cottage cheese. Whole-milk cheeses. Whole or 2% milk that is liquid, evaporated, or condensed. Whole buttermilk. Cream sauce or high-fat cheese sauce. Yogurt that is made from whole milk. ?Fats and oils ?Meat fat, or shortening. Cocoa butter, hydrogenated oils, palm oil, coconut oil, palm kernel oil. Solid fats and shortenings, including bacon fat, salt pork, lard, and butter. Nondairy cream substitutes. Salad dressings with cheese or sour cream. ?Beverages ?Regular sodas and juice drinks with added sugar. ?Sweets and desserts ?Frosting. Pudding. Cookies. Cakes. Pies. Milk chocolate or white chocolate. Buttered syrups. Full-fat ice cream or ice cream drinks. ?The items listed above may not be a complete list of foods and drinks to avoid. Contact a dietitian for more information. ?Summary ?Heart-healthy meal planning includes eating less unhealthy fats, eating more  healthy fats, and making other changes in your diet. ?Eat a balanced diet. This includes fruits and vegetables, low-fat or nonfat dairy, lean protein, nuts and legumes, whole grains, and heart-healthy oils and fats. ?This information is not intended to replace advice given to you by your health care provider. Make sure you discuss any questions you have with your health care provider. ?Document Revised: 05/23/2020 Document Reviewed: 05/23/2020 ?Elsevier Patient Education ? [place]. ? ?Mediterranean Diet ?A Mediterranean diet refers to food and lifestyle choices that are based on the traditions of countries located on the [employer]. It focuses on eating more fruits, vegetables, whole grai

## 2021-04-21 NOTE — Telephone Encounter (Signed)
I have called an spoke with the patient ?

## 2021-04-21 NOTE — Progress Notes (Signed)
?Cardiology Office Note:   ? ?Date:  04/23/2021  ? ?ID:  Stuart Hill, DOB 08/20/1970, MRN [id number] ? ?PCP:  Copland, Gay Filler, MD ?  ?[facility] Providers ?Cardiologist:  Berniece Salines, DO    ? ?Referring MD: Darreld Mclean, MD  ? ?No chief complaint on file. ? ? ?History of Present Illness:   ? ?Stuart Hill is a 51 y.o. male with a hx of hyperlipidemia, polycythemia vera, COPD, and dilatation of the proximal ascending aorta.  Patient was initially seen in August 2022 for chest discomfort.  Coronary CT obtained on 09/06/2020 showed minimal CAD in the mid LAD, coronary calcium score 24, which placed the patient in the 80th percentile for age and sex matched control, mild a sending aortic dilatation at 43 mm at the sinus of Valsalva, less than 25% stenosis in mid LAD, otherwise normal RCA, normal left main, and a normal left circumflex artery.  No significant pulmonary finding.  Echocardiogram obtained 09/02/2020 showed EF 55%, no significant valve issue, mild dilatation of the aortic root. Heart monitor obtained in January 2023 showed paroxysmal supraventricular tachycardia.  He did not some minimal dose of carvedilol due to bradycardia.  He was seen at [facility], ED in January for shortness of breath and the significant chest discomfort in the setting of COPD exacerbation.  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  Patient was seen by Dr. Harriet Masson on 02/13/2021 at which time he complained of intermittent palpitation.  There was some suspicion that his anxiety played a factor.  Low-dose losartan was added to his medical regimen. ? ?Patient presents today for follow-up.  Last week alone, he had several days of intermittent palpitation.  He says he drinks roughly 3 cups of coffee plus a red bull each day.  I recommended discontinuing all caffeinated drinks and alcohol.  He will need a thyroid panel to rule  out hyperthyroidism.  Since he had significant bradycardia on 3.125 mg twice a day of carvedilol, I am hesitant to try diltiazem which may also precipitate slow heart rate as well.  I recommended reassessment in 6 weeks after cutting back on caffeine, if he still have frequent palpitation, may consider referral to EP service.  As for his blood pressure, his blood pressure has been in the 120-140s and sometimes 150s at home, I recommended continue losartan, keep a blood pressure diary of twice a day.  Follow-up in 6 weeks for blood pressure medication titration and also reassessment of palpitation. ? ? ?Past Medical History:  ?Diagnosis Date  ? Allergy   ? Anxiety   ? Arthritis   ? Bell's palsy   ? Erythrocytosis 04/10/2015  ? Hyperlipidemia   ? Polycythemia vera ([place]) 05/03/2015  ? ? ?Past Surgical History:  ?Procedure Laterality Date  ? DENTAL SURGERY    ? HAND SURGERY    ? TONSILLECTOMY Bilateral 04/30/2017  ? Procedure: TONSILLECTOMY;  Surgeon: Jerrell Belfast, MD;  Location: [facility];  Service: ENT;  Laterality: Bilateral;  ? ? ?Current Medications: ?Current Meds  ?Medication Sig  ? albuterol (VENTOLIN HFA) 108 (90 Base) MCG/ACT inhaler Inhale 1-2 puffs into the lungs every 6 (six) hours as needed for wheezing or shortness of breath.  ? ALPRAZolam (XANAX) 1 MG tablet TAKE 1 TABLET BY MOUTH THREE TIMES A DAY AS NEEDED FOR ANXIETY  ? aspirin EC 81 MG tablet Take 162 mg  by mouth at bedtime. Swallow whole.  ? Esomeprazole Magnesium (NEXIUM PO) Take 1 tablet by mouth as needed (acid reflux).  ? losartan (COZAAR) 25 MG tablet Take 1 tablet (25 mg total) by mouth daily.  ? nitroGLYCERIN (NITROSTAT) 0.4 MG SL tablet Place 1 tablet (0.4 mg total) under the tongue every 5 (five) minutes as needed for chest pain.  ? rosuvastatin (CRESTOR) 10 MG tablet TAKE 1 TABLET BY MOUTH ONCE DAILY AT BEDTIME (Patient taking differently: Take 10 mg by mouth at bedtime.)  ? Tetrahydrozoline HCl (VISINE OP) Apply 1 drop to  eye as needed (itchy, dryness).  ?  ? ?Allergies:   Plavix [clopidogrel bisulfate]; Afrin [nasal spray]; Antihistamines, chlorpheniramine-type; Benadryl [diphenhydramine hcl]; Buprenorphine hcl; Carisoprodol; Codeine; Metoclopramide; Morphine and related; Oxymetazoline; and Pheniramine  ? ?Social History  ? ?Socioeconomic History  ? Marital status: Married  ?  Spouse name: Not on file  ? Number of children: Not on file  ? Years of education: Not on file  ? Highest education level: Not on file  ?Occupational History  ? Not on file  ?Tobacco Use  ? Smoking status: Every Day  ?  Packs/day: 0.25  ?  Years: 24.00  ?  Pack years: 6.00  ?  Types: Cigarettes  ? Smokeless tobacco: Never  ?Vaping Use  ? Vaping Use: Never used  ?Substance and Sexual Activity  ? Alcohol use: Yes  ?  Alcohol/week: 1.0 standard drink  ?  Types: 1 Cans of beer per week  ?  Comment: 6-10 beer a week  ? Drug use: Yes  ?  Types: Other-see comments, Marijuana  ?  Comment: Marijuana every other day.  ? Sexual activity: Yes  ?Other Topics Concern  ? Not on file  ?Social History Narrative  ? Not on file  ? ?Social Determinants of Health  ? ?Financial Resource Strain: Not on file  ?Food Insecurity: Not on file  ?Transportation Needs: Not on file  ?Physical Activity: Not on file  ?Stress: Not on file  ?Social Connections: Not on file  ?  ? ?Family History: ?The patient's family history includes COPD in his mother; Depression in his brother; Fibromyalgia in his mother; Heart disease in his brother, father, maternal grandfather, maternal grandmother, paternal grandfather, and paternal grandmother; Stroke in his father, maternal grandfather, maternal grandmother, paternal aunt, paternal grandfather, and paternal grandmother. ? ?ROS:   ?Please see the history of present illness.    ? All other systems reviewed and are negative. ? ?EKGs/Labs/Other Studies Reviewed:   ? ?The following studies were reviewed today: ? ?Echo 02/08/2021 ?1. Left ventricular ejection  fraction, by estimation, is 60 to 65%. The  ?left ventricle has normal function. The left ventricle has no regional  ?wall motion abnormalities. Left ventricular diastolic parameters were  ?normal.  ? 2. Right ventricular systolic function is normal. The right ventricular  ?size is normal.  ? 3. The mitral valve is normal in structure. No evidence of mitral valve  ?regurgitation.  ? 4. The aortic valve is normal in structure. Aortic valve regurgitation is  ?not visualized.  ? 5. 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  ? 6. The inferior vena cava is normal in size with greater than 50%  ?respiratory variability, suggesting right atrial pressure of 3 mmHg.  ? ?Comparison(s): No significant change from prior study.  ? ?Conclusion(s)/Recommendation(s): Otherwise normal echocardiogram, with  ?minor abnormalities described in the report. Small aortic root aneurysm.  ?stable.  ? ?EKG:  EKG is ordered today.  The ekg ordered today demonstrates normal sinus rhythm, no significant ST-T wave changes ? ?Recent Labs: ?04/18/2021: ALT 15; BUN 14; Creatinine 0.90; Hemoglobin 14.8; Platelet Count 242; Potassium 3.8; Sodium 138 ?04/21/2021: TSH 1.980  ?Recent Lipid Panel ?   ?Component Value Date/Time  ? CHOL 205 (H) 07/26/2020 1131  ? CHOL 203 (H) 11/03/2019 1052  ? TRIG 178.0 (H) 07/26/2020 1131  ? HDL 75.80 07/26/2020 1131  ? HDL 65 11/03/2019 1052  ? CHOLHDL 3 07/26/2020 1131  ? VLDL 35.6 07/26/2020 1131  ? [place] 94 07/26/2020 1131  ? LDLCALC 121 (H) 11/03/2019 1052  ? ? ? ?Risk Assessment/Calculations:   ?  ? ?    ? ?Physical Exam:   ? ?VS:  BP 126/82   Pulse 67   Ht '6\' 3"'$  (1.905 m)   Wt 189 lb 6.4 oz (85.9 kg)   SpO2 96%   BMI 23.67 kg/m?    ? ?Wt Readings from Last 3 Encounters:  ?04/21/21 189 lb 6.4 oz (85.9 kg)  ?02/21/21 188 lb 4 oz (85.4 kg)  ?02/19/21 195 lb 6.4 oz (88.6 kg)  ?  ? ?GEN:  Well nourished, well developed in no acute distress ?HEENT: Normal ?NECK: No JVD; No carotid bruits ?LYMPHATICS: No  lymphadenopathy ?CARDIAC: RRR, no murmurs, rubs, gallops ?RESPIRATORY:  Clear to auscultation without rales, wheezing or rhonchi  ?ABDOMEN: Soft, non-tender, non-distended ?MUSCULOSKELETAL:  No edema; No deformity  ?SKIN: Warm

## 2021-04-22 LAB — TSH+T4F+T3FREE
Free T4: 1.27 ng/dL (ref 0.82–1.77)
T3, Free: 3 pg/mL (ref 2.0–4.4)
TSH: 1.98 u[IU]/mL (ref 0.450–4.500)

## 2021-04-22 NOTE — Progress Notes (Signed)
Normal thyroid level

## 2021-04-23 ENCOUNTER — Telehealth: Payer: Self-pay | Admitting: Licensed Clinical Social Worker

## 2021-04-23 NOTE — Telephone Encounter (Signed)
Noted pt uninsured, previously had been approved for CAFA but this expired in February.  ?Attempted to reach pt at [phone]. No answer, will reattempt as able.  ? ?Westley Hummer, MSW, LCSW ?Clinical Social Worker II ?[place] [facility] Navigation  ?[phone]- work cell phone (preferred) ?[phone]- desk phone ? ?

## 2021-04-24 ENCOUNTER — Telehealth: Payer: Self-pay | Admitting: Licensed Clinical Social Worker

## 2021-04-24 NOTE — Telephone Encounter (Signed)
Second call attempted to pt. Noted pt uninsured, previously had been approved for CAFA but this expired in February.  ?Attempted to reach pt at [phone]. No answer again, left second message, will reattempt as able.  ?  ?Westley Hummer, MSW, LCSW ?Clinical Social Worker II ?[place] [facility] Navigation  ?[phone]- work cell phone (preferred) ?[phone]- desk phone ?  ?

## 2021-04-25 ENCOUNTER — Telehealth: Payer: Self-pay | Admitting: Licensed Clinical Social Worker

## 2021-04-25 NOTE — Telephone Encounter (Signed)
Attempted to reach pt at [phone]. No answer, at this time pt voicemail full so unable to leave message. I will make fourth and final attempt on Monday to reach pt via spouse unless he contacts me before that time.  ? ?Westley Hummer, MSW, LCSW ?Clinical Social Worker II ?[place] [facility] Navigation  ?[phone]- work cell phone (preferred) ?[phone]- desk phone ?  ?

## 2021-04-29 ENCOUNTER — Telehealth: Payer: Self-pay | Admitting: Licensed Clinical Social Worker

## 2021-04-29 NOTE — Progress Notes (Signed)
?Heart and Vascular Care Navigation ? ?04/29/2021 ? ?Stuart Hill ?18-Apr-1970 ?[id number] ? ?Reason for Referral:  ?LCSW will speak with  ?Engaged with patient by telephone for initial visit for Heart and Vascular Care Coordination. ?                                                                                                  ?Assessment:       ?LCSW called pt wife [name] and was able to reach her at [phone]. DPR on file. I introduced self, role, reason for call. Pt wife shares she usually manages patient assistance applications but isnt feeling her best so she passed the phone to pt. I re-introduced self, role, reason for call. Pt confirmed home address, PCP, he lives with spouse, employed and currently uninsured. Pt previously had completed CAFA and was approved for 75% assistance. I discussed that pt should complete new application/supporting documents. I also discussed potential of [employer] and [employer] application assistance. Pt interested in all the above. He was encouraged to contact me with any questions/concerns. Denied any additional needs at this time with additional SDOH needs. I remain available.  ? ?HRT/VAS Care Coordination   ? ? Patients Home Cardiology Office [facility]  ? Outpatient Care Team Social Worker  ? Social Worker Name: Margarito Liner [facility] [phone]  ? Living arrangements for the past 2 months Single Family Home  ? Lives with: Spouse  ? Patient Current Insurance Coverage Self-Pay  ? Patient Has Concern With Paying Medical Bills Yes  ? Patient Concerns With Medical Bills cone assistance previous approval lapsed in Feb, ongoing medical work up  ? Medical Bill Referrals: CAFA/[place] Card mailed  ? Does Patient Have Prescription Coverage? No  ? Patient Prescription Assistance Programs [place] [facility]  ? [place] [facility] Medications mailed pt application  ? Home Assistive Devices/Equipment None  ? ?  ? ? ?Social History:                                                                              ?SDOH Screenings  ? ?Alcohol Screen: Not on file  ?Depression (PHQ2-9): Medium Risk  ? PHQ-2 Score: 6  ?Financial Resource Strain: Medium Risk  ? Difficulty of Paying Living Expenses: Somewhat hard  ?Food Insecurity: No Food Insecurity  ? Worried About Charity fundraiser in the Last Year: Never true  ? Ran Out of Food in the Last Year: Never true  ?Housing: Low Risk   ? Last Housing Risk Score: 0  ?Physical Activity: Not on file  ?Social Connections: Not on file  ?Stress: Not on file  ?Tobacco Use: High Risk  ? Smoking Tobacco Use: Every Day  ? Smokeless Tobacco Use: Never  ? Passive Exposure: Not on file  ?Transportation Needs: No Transportation Needs  ?  Lack of Transportation (Medical): No  ? Lack of Transportation (Non-Medical): No  ? ? ?SDOH Interventions: ?Pensions consultant:  Sales promotion account executive Interventions: Development worker, community, Other (Comment) (Mailed [employer], [employer] and Market researcher) ?Financial Counseling for [employer] Program  ?Food Insecurity:  Food Insecurity Interventions: Intervention Not Indicated  ?Housing Insecurity:  Housing Interventions: Intervention Not Indicated  ?Transportation:    Transportation Interventions: Intervention Not Indicated  ? ? ?Other Care Navigation Interventions:    ? ?Provided Pharmacy assistance resources [place] [facility]  ? ?Follow-up plan:   ?I mailed pt [employer], [employer]  (CAFA) and [employer] application. Included my contact card and instructions on how to complete each. I remain available. ? ? ? ?

## 2021-05-13 ENCOUNTER — Telehealth: Payer: Self-pay | Admitting: Licensed Clinical Social Worker

## 2021-05-13 NOTE — Telephone Encounter (Signed)
LCSW left a message on pt voicemail requesting call back [phone]). F/u on assistance applications mailed to pt. If no call back will attempt pt spouse later this week.  ? ?Westley Hummer, MSW, LCSW ?Clinical Social Worker II ?[place] [facility] Navigation  ?[phone]- work cell phone (preferred) ?[phone]- desk phone ? ?

## 2021-05-19 ENCOUNTER — Telehealth: Payer: Self-pay | Admitting: Licensed Clinical Social Worker

## 2021-05-19 NOTE — Telephone Encounter (Signed)
Attempted again to reach pt regarding assistance applications. No answer at [phone]. If he does not reach out to me then I will attempt pt spouse.  ? ?Westley Hummer, MSW, LCSW ?Clinical Social Worker II ?[place] [facility] Navigation  ?[phone]- work cell phone (preferred) ?[phone]- desk phone ? ?

## 2021-05-30 ENCOUNTER — Institutional Professional Consult (permissible substitution): Payer: Self-pay | Admitting: Internal Medicine

## 2021-05-30 ENCOUNTER — Telehealth: Payer: Self-pay | Admitting: Licensed Clinical Social Worker

## 2021-05-30 NOTE — Telephone Encounter (Signed)
LCSW attempted pt again today- no answer at [phone]. I opted to not leave a third voicemail. Called pt spouse [name] [phone]), re-introduced self, role, reason for call. Pt wife shares they have received the paperwork but havent started it yet. I shared how to complete and which documents they would need to gather. She will start them today w/ pt and was encouraged to call with any questions.  ? ?Westley Hummer, MSW, LCSW ?Clinical Social Worker II ?[place] [facility] Navigation  ?[phone]- work cell phone (preferred) ?[phone]- desk phone ? ?

## 2021-06-13 ENCOUNTER — Ambulatory Visit: Payer: Self-pay | Admitting: Physician Assistant

## 2021-06-13 ENCOUNTER — Inpatient Hospital Stay: Payer: Self-pay

## 2021-06-23 NOTE — Progress Notes (Unsigned)
[place] [facility] [address], [place], [place] [address] [phone]  Date:  06/25/2021   Name:  Stuart Hill   DOB:  20-Jul-1970   MRN:  [id number]  PCP:  Darreld Mclean, MD    Chief Complaint: No chief complaint on file.   History of Present Illness:  Stuart Hill is a 51 y.o. very pleasant male patient who presents with the following:  Patient seen today with concern of possible broken pinky toe-  hx of hyperlipidemia, polycythemia vera, COPD, and dilatation of the proximal ascending aorta, anxiety, tobacco use Last seen by myself in January-at that time his main concern was anxiety, he did not want to start an SSRI or SNRI.  We tried diazepam instead of alprazolam, but in short order he wanted to change back to alprazolam again  He has seen cardiology a couple times since our last visit-most recently in March Patient Active Problem List   Diagnosis Date Noted   Paroxysmal SVT (supraventricular tachycardia) ([place])    Emphysema of lung ([place])    Palpitations 01/09/2021   Minimal CAD 01/09/2021   Mixed hyperlipidemia 01/09/2021   Ascending aorta dilatation ([place]) 01/09/2021   Allergy 08/19/2020   Anxiety 08/19/2020   Arthritis 08/19/2020   Bell's palsy 08/19/2020   Chest pain 07/26/2020   Family history of early CAD 07/26/2020   DOE (dyspnea on exertion) 12/14/2019   Angina pectoris ([place]) 11/03/2019   Tobacco abuse 11/03/2019   Other hyperlipidemia 11/03/2019   Tonsillar hypertrophy 04/30/2017   Obstructive sleep apnea 04/23/2017   Polycythemia, secondary 09/29/2016   Polycythemia vera ([place]) 05/03/2015   Hemoptysis 04/03/2015   Condylomata acuminata 01/18/2015   Generalized anxiety disorder 03/05/2014    Past Medical History:  Diagnosis Date   Allergy    Anxiety    Arthritis    Bell's palsy    Erythrocytosis 04/10/2015   Hyperlipidemia    Polycythemia vera ([place]) 05/03/2015    Past Surgical  History:  Procedure Laterality Date   DENTAL SURGERY     HAND SURGERY     TONSILLECTOMY Bilateral 04/30/2017   Procedure: TONSILLECTOMY;  Surgeon: Jerrell Belfast, MD;  Location: [place];  Service: ENT;  Laterality: Bilateral;    Social History   Tobacco Use   Smoking status: Every Day    Packs/day: 0.25    Years: 24.00    Pack years: 6.00    Types: Cigarettes   Smokeless tobacco: Never  Vaping Use   Vaping Use: Never used  Substance Use Topics   Alcohol use: Yes    Alcohol/week: 1.0 standard drink    Types: 1 Cans of beer per week    Comment: 6-10 beer a week   Drug use: Yes    Types: Other-see comments, Marijuana    Comment: Marijuana every other day.    Family History  Problem Relation Age of Onset   COPD Mother    Fibromyalgia Mother    Heart disease Father    Stroke Father    Depression Brother    Heart disease Brother    Heart disease Maternal Grandmother    Stroke Maternal Grandmother    Heart disease Maternal Grandfather    Stroke Maternal Grandfather    Heart disease Paternal Grandmother    Stroke Paternal Grandmother    Heart disease Paternal Grandfather    Stroke Paternal Grandfather    Stroke Paternal Aunt  Allergies  Allergen Reactions   Plavix [Clopidogrel Bisulfate] Other (See Comments)    Confusion, lethargy, slurred speech   Afrin [Nasal Spray] Hives   Antihistamines, Chlorpheniramine-Type Hives   Benadryl [Diphenhydramine Hcl] Hives   Buprenorphine Hcl Hives   Carisoprodol Hives   Codeine Hives   Metoclopramide Hives   Morphine And Related Hives   Oxymetazoline Hives   Pheniramine Hives and Rash    Medication list has been reviewed and updated.  Current Outpatient Medications on File Prior to Visit  Medication Sig Dispense Refill   albuterol (VENTOLIN HFA) 108 (90 Base) MCG/ACT inhaler Inhale 1-2 puffs into the lungs every 6 (six) hours as needed for wheezing or shortness of breath. 18 g 3   ALPRAZolam (XANAX)  1 MG tablet TAKE 1 TABLET BY MOUTH THREE TIMES A DAY AS NEEDED FOR ANXIETY 90 tablet 2   aspirin EC 81 MG tablet Take 162 mg by mouth at bedtime. Swallow whole.     Esomeprazole Magnesium (NEXIUM PO) Take 1 tablet by mouth as needed (acid reflux).     losartan (COZAAR) 25 MG tablet Take 1 tablet (25 mg total) by mouth daily. 90 tablet 3   nitroGLYCERIN (NITROSTAT) 0.4 MG SL tablet Place 1 tablet (0.4 mg total) under the tongue every 5 (five) minutes as needed for chest pain. 25 tablet 6   rosuvastatin (CRESTOR) 10 MG tablet TAKE 1 TABLET BY MOUTH ONCE DAILY AT BEDTIME (Patient taking differently: Take 10 mg by mouth at bedtime.) 90 tablet 1   Tetrahydrozoline HCl (VISINE OP) Apply 1 drop to eye as needed (itchy, dryness).     Vitamin D, Ergocalciferol, (DRISDOL) 1.25 MG (50000 UNIT) CAPS capsule Take 1 capsule (50,000 Units total) by mouth every 7 (seven) days. (Patient not taking: Reported on 04/21/2021) 12 capsule 0   No current facility-administered medications on file prior to visit.    Review of Systems:  As per HPI- otherwise negative.   Physical Examination: There were no vitals filed for this visit. There were no vitals filed for this visit. There is no height or weight on file to calculate BMI. Ideal Body Weight:    GEN: no acute distress. HEENT: Atraumatic, Normocephalic.  Ears and Nose: No external deformity. CV: RRR, No M/G/R. No JVD. No thrill. No extra heart sounds. PULM: CTA B, no wheezes, crackles, rhonchi. No retractions. No resp. distress. No accessory muscle use. ABD: S, NT, ND, +BS. No rebound. No HSM. EXTR: No c/c/e PSYCH: Normally interactive. Conversant.    Assessment and Plan: ***  Signed Lamar Blinks, MD

## 2021-06-25 ENCOUNTER — Ambulatory Visit (INDEPENDENT_AMBULATORY_CARE_PROVIDER_SITE_OTHER): Payer: Self-pay | Admitting: Family Medicine

## 2021-06-25 VITALS — BP 122/70 | HR 54 | Temp 97.7°F | Resp 18 | Ht 75.0 in | Wt 191.0 lb

## 2021-06-25 DIAGNOSIS — R0602 Shortness of breath: Secondary | ICD-10-CM

## 2021-06-25 DIAGNOSIS — M79644 Pain in right finger(s): Secondary | ICD-10-CM

## 2021-06-25 DIAGNOSIS — R229 Localized swelling, mass and lump, unspecified: Secondary | ICD-10-CM

## 2021-06-25 DIAGNOSIS — F411 Generalized anxiety disorder: Secondary | ICD-10-CM

## 2021-06-25 MED ORDER — ALPRAZOLAM 1 MG PO TABS: ORAL_TABLET | ORAL | 2 refills | Status: DC

## 2021-06-25 MED ORDER — ALBUTEROL SULFATE HFA 108 (90 BASE) MCG/ACT IN AERS: 1.0000 | INHALATION_SPRAY | Freq: Four times a day (QID) | RESPIRATORY_TRACT | 3 refills | Status: DC | PRN

## 2021-06-25 NOTE — Patient Instructions (Signed)
It was good to see you today Please go by [facility] on [address] at your convenience for x-rays, I will let you know what they show [place]  Take care

## 2021-06-27 ENCOUNTER — Ambulatory Visit: Payer: Self-pay | Admitting: Nurse Practitioner

## 2021-06-27 NOTE — Progress Notes (Deleted)
Office Visit    Patient Name: Stuart Hill Date of Encounter: 06/27/2021  Primary Care Provider:  Darreld Mclean, MD Primary Cardiologist:  Berniece Salines, DO  Chief Complaint    51 year old male with a history of minimal nonobstructive CAD, PSVT, hyperlipidemia, dilation of the ascending aorta, COPD, and polycythemia vera who presents for follow-up related to PSVT/palpitations.   Past Medical History    Past Medical History:  Diagnosis Date   Allergy    Anxiety    Arthritis    Bell's palsy    Erythrocytosis 04/10/2015   Hyperlipidemia    Polycythemia vera ([place]) 05/03/2015   Past Surgical History:  Procedure Laterality Date   DENTAL SURGERY     HAND SURGERY     TONSILLECTOMY Bilateral 04/30/2017   Procedure: TONSILLECTOMY;  Surgeon: Jerrell Belfast, MD;  Location: [place];  Service: ENT;  Laterality: Bilateral;    Allergies  Allergies  Allergen Reactions   Plavix [Clopidogrel Bisulfate] Other (See Comments)    Confusion, lethargy, slurred speech   Afrin [Nasal Spray] Hives   Antihistamines, Chlorpheniramine-Type Hives   Benadryl [Diphenhydramine Hcl] Hives   Buprenorphine Hcl Hives   Carisoprodol Hives   Codeine Hives   Metoclopramide Hives   Morphine And Related Hives   Oxymetazoline Hives   Pheniramine Hives and Rash    History of Present Illness    51 year old male with the above past medical history including minimal nonobstructive CAD, PSVT, hyperlipidemia, dilation of the ascending aorta, COPD, and polycythemia vera.  He was initially evaluated in August 2022 for chest discomfort.  Coronary CT angiogram at the time showed minimal CAD in the mid LAD, coronary calcium score of 24, 80th percentile for age and sex-matched control, mild dilation of ascending aorta measuring 43 mm.  Cardiac monitor in January 2023 in the setting of palpitations showed paroxysmal supraventricular tachycardia.  Unfortunately, he did not tolerate beta-blocker  therapy due to bradycardia.  Evaluated in the [facility] ED in January 2023 for shortness of breath and chest discomfort in the setting of COPD exacerbation.  Echocardiogram in January 2023 showed EF 60 to 65%, normal RV, normal mitral and aortic valve, aortic root aneurysm measuring 4.1 cm.  He was last seen in the office on 04/21/2021 and reported intermittent palpitations, likely exacerbated by significant caffeine intake.  If palpitations persist despite decrease caffeine intake, possible EP referral was discussed given intolerance to rate controlling medications.  He presents today for follow-up.  Since his last visit  Palpitations/PSVT: Minimal nonobstructive CAD: Dilation of ascending aorta: Hyperlipidemia: COPD: Disposition:  Home Medications    Current Outpatient Medications  Medication Sig Dispense Refill   albuterol (VENTOLIN HFA) 108 (90 Base) MCG/ACT inhaler Inhale 1-2 puffs into the lungs every 6 (six) hours as needed for wheezing or shortness of breath. 18 g 3   ALPRAZolam (XANAX) 1 MG tablet TAKE 1 TABLET BY MOUTH THREE TIMES A DAY AS NEEDED FOR ANXIETY 90 tablet 2   aspirin EC 81 MG tablet Take 162 mg by mouth at bedtime. Swallow whole.     Esomeprazole Magnesium (NEXIUM PO) Take 1 tablet by mouth as needed (acid reflux).     losartan (COZAAR) 25 MG tablet Take 1 tablet (25 mg total) by mouth daily. 90 tablet 3   nitroGLYCERIN (NITROSTAT) 0.4 MG SL tablet Place 1 tablet (0.4 mg total) under the tongue every 5 (five) minutes as needed for chest pain. 25 tablet 6   rosuvastatin (CRESTOR) 10 MG  tablet TAKE 1 TABLET BY MOUTH ONCE DAILY AT BEDTIME (Patient taking differently: Take 10 mg by mouth at bedtime.) 90 tablet 1   Tetrahydrozoline HCl (VISINE OP) Apply 1 drop to eye as needed (itchy, dryness).     Vitamin D, Ergocalciferol, (DRISDOL) 1.25 MG (50000 UNIT) CAPS capsule Take 1 capsule (50,000 Units total) by mouth every 7 (seven) days. 12 capsule 0   No current  facility-administered medications for this visit.     Review of Systems    ***.  All other systems reviewed and are otherwise negative except as noted above.    Physical Exam    VS:  There were no vitals taken for this visit. , BMI There is no height or weight on file to calculate BMI.     GEN: Well nourished, well developed, in no acute distress. HEENT: normal. Neck: Supple, no JVD, carotid bruits, or masses. Cardiac: RRR, no murmurs, rubs, or gallops. No clubbing, cyanosis, edema.  Radials/DP/PT 2+ and equal bilaterally.  Respiratory:  Respirations regular and unlabored, clear to auscultation bilaterally. GI: Soft, nontender, nondistended, BS + x 4. MS: no deformity or atrophy. Skin: warm and dry, no rash. Neuro:  Strength and sensation are intact. Psych: Normal affect.  Accessory Clinical Findings    ECG personally reviewed by me today - *** - no acute changes.  Lab Results  Component Value Date   WBC 13.3 (H) 04/18/2021   HGB 14.8 04/18/2021   HCT 43.9 04/18/2021   MCV 90.0 04/18/2021   PLT 242 04/18/2021   Lab Results  Component Value Date   CREATININE 0.90 04/18/2021   BUN 14 04/18/2021   NA 138 04/18/2021   K 3.8 04/18/2021   CL 105 04/18/2021   CO2 28 04/18/2021   Lab Results  Component Value Date   ALT 15 04/18/2021   AST 17 04/18/2021   ALKPHOS 47 04/18/2021   BILITOT 0.7 04/18/2021   Lab Results  Component Value Date   CHOL 205 (H) 07/26/2020   HDL 75.80 07/26/2020   LDLCALC 94 07/26/2020   TRIG 178.0 (H) 07/26/2020   CHOLHDL 3 07/26/2020    No results found for: HGBA1C  Assessment & Plan    1.  ***   Lenna Sciara, NP 06/27/2021, 5:48 AM

## 2021-07-02 ENCOUNTER — Encounter: Payer: Self-pay | Admitting: Nurse Practitioner

## 2021-07-11 ENCOUNTER — Telehealth: Payer: Self-pay | Admitting: Licensed Clinical Social Worker

## 2021-07-11 NOTE — Telephone Encounter (Signed)
H&V Care Navigation CSW Progress Note  Clinical Social Worker contacted patient by phone to attempt f/u regarding assistance applications. Call made to Stuart Hill today at [phone]. No answer, voicemail left. I have made multiple attempts to assist Stuart Hill/Stuart Hill wife with completing assistance applications. Have had minimal engagement at this time. Will not actively follow at this time unless Stuart Hill f/u with me directly.   Patient is participating in a Managed Medicaid Plan: No, self-pay only  SDOH Screenings   Alcohol Screen: Not on file  Depression (PHQ2-9): Medium Risk (06/27/2020)   Depression (PHQ2-9)    PHQ-2 Score: 6  Financial Resource Strain: Medium Risk (04/29/2021)   Overall Financial Resource Strain (CARDIA)    Difficulty of Paying Living Expenses: Somewhat hard  Food Insecurity: No Food Insecurity (04/29/2021)   Hunger Vital Sign    Worried About Running Out of Food in the Last Year: Never true    Ran Out of Food in the Last Year: Never true  Housing: Low Risk  (04/29/2021)   Housing    Last Housing Risk Score: 0  Physical Activity: Not on file  Social Connections: Not on file  Stress: Not on file  Tobacco Use: High Risk (04/21/2021)   Patient History    Smoking Tobacco Use: Every Day    Smokeless Tobacco Use: Never    Passive Exposure: Not on file  Transportation Needs: No Transportation Needs (04/29/2021)   PRAPARE - Transportation    Lack of Transportation (Medical): No    Lack of Transportation (Non-Medical): No    Westley Hummer, MSW, [place]  [phone]- work cell phone (preferred) [phone]- desk phone

## 2021-07-30 ENCOUNTER — Encounter: Payer: Self-pay | Admitting: Physician Assistant

## 2021-07-30 NOTE — Progress Notes (Signed)
This encounter was created in error - please disregard.

## 2021-08-04 ENCOUNTER — Encounter: Payer: Self-pay | Admitting: Physician Assistant

## 2021-08-08 ENCOUNTER — Emergency Department (HOSPITAL_BASED_OUTPATIENT_CLINIC_OR_DEPARTMENT_OTHER): Payer: Self-pay

## 2021-08-08 ENCOUNTER — Emergency Department (HOSPITAL_BASED_OUTPATIENT_CLINIC_OR_DEPARTMENT_OTHER)
Admission: EM | Admit: 2021-08-08 | Discharge: 2021-08-08 | Disposition: A | Discharge status: Home / Self Care | Payer: Self-pay | Attending: Emergency Medicine | Admitting: Emergency Medicine

## 2021-08-08 ENCOUNTER — Telehealth: Payer: Self-pay | Admitting: *Deleted

## 2021-08-08 ENCOUNTER — Encounter (HOSPITAL_BASED_OUTPATIENT_CLINIC_OR_DEPARTMENT_OTHER): Payer: Self-pay | Admitting: Emergency Medicine

## 2021-08-08 ENCOUNTER — Other Ambulatory Visit: Payer: Self-pay

## 2021-08-08 DIAGNOSIS — R079 Chest pain, unspecified: Secondary | ICD-10-CM

## 2021-08-08 DIAGNOSIS — I712 Thoracic aortic aneurysm, without rupture, unspecified: Secondary | ICD-10-CM | POA: Insufficient documentation

## 2021-08-08 DIAGNOSIS — I77810 Thoracic aortic ectasia: Secondary | ICD-10-CM

## 2021-08-08 HISTORY — DX: Chronic obstructive pulmonary disease, unspecified: J44.9

## 2021-08-08 LAB — BASIC METABOLIC PANEL
Anion gap: 8 (ref 5–15)
BUN: 12 mg/dL (ref 6–20)
CO2: 25 mmol/L (ref 22–32)
Calcium: 8.8 mg/dL — ABNORMAL LOW (ref 8.9–10.3)
Chloride: 105 mmol/L (ref 98–111)
Creatinine, Ser: 0.86 mg/dL (ref 0.61–1.24)
GFR, Estimated: >60 mL/min (ref >60)
Glucose, Bld: 132 mg/dL — ABNORMAL HIGH (ref 70–99)
Potassium: 4.2 mmol/L (ref 3.5–5.1)
Sodium: 138 mmol/L (ref 135–145)

## 2021-08-08 LAB — CBC
HCT: 48.4 % (ref 39.0–52.0)
Hemoglobin: 16.5 g/dL (ref 13.0–17.0)
MCH: 30.9 pg (ref 26.0–34.0)
MCHC: 34.1 g/dL (ref 30.0–36.0)
MCV: 90.6 fL (ref 80.0–100.0)
Platelets: 234 10*3/uL (ref 150–400)
RBC: 5.34 MIL/uL (ref 4.22–5.81)
RDW: 13.5 % (ref 11.5–15.5)
WBC: 7 10*3/uL (ref 4.0–10.5)
nRBC: 0 % (ref 0.0–0.2)

## 2021-08-08 LAB — TROPONIN I (HIGH SENSITIVITY): Troponin I (High Sensitivity): <2 ng/L (ref <18)

## 2021-08-08 MED ORDER — IOHEXOL 350 MG/ML SOLN
100.0000 mL | Freq: Once | INTRAVENOUS | Status: AC | PRN
Start: 2021-08-08 — End: 2021-08-08
  Administered 2021-08-08: 100 mL via INTRAVENOUS

## 2021-08-08 NOTE — Discharge Instructions (Signed)
Follow-up with cardiology team.

## 2021-08-08 NOTE — ED Notes (Signed)
Patient returned from CT scanner

## 2021-08-08 NOTE — Telephone Encounter (Signed)
Call received from patient's wife, [name]  stating that pt is having pain and swelling to his ankle and feels as if he has a blood clot.  Pt.'s wife informed that Dr. Marin Olp is not in the office today and to contact pt.'s PCP or take pt to the ER to be assessed.  [name] states that she will take pt to the ER now and is appreciative of assistance.

## 2021-08-08 NOTE — ED Notes (Signed)
Patient made the comment, "I'd just like to go to sleep and not have to deal with stuff," when asked why he had missed some of his doctor's appointments. He acknowledged feeling depressed.

## 2021-08-08 NOTE — ED Notes (Signed)
Patient given discharge instructions, all questions answered. Patient in possession of all belongings, directed to the discharge area  

## 2021-08-08 NOTE — ED Provider Notes (Signed)
[place] EMERGENCY DEPARTMENT Provider Note   CSN: [id number] Arrival date & time: 08/08/21  1205     History  Chief Complaint  Patient presents with   Chest Pain    Stuart Hill is a 51 y.o. male.  Presents emergency room due to concern for chest pain.  Has history of thoracic aortic aneurysm.  Patient states that over the past few days he has been having intermittent mild episodes of chest discomfort, lasting for few seconds or a few minutes at a time.  No associated difficulty in breathing.  No pattern to pain, no associated with exertion.  Currently pain-free.  He also noted a few days ago some mild pain and discomfort to his right lower leg, concerned that he may have a blood clot in his leg.  He has also noted slight swelling.  Is still able to walk without difficulty, no trauma to this leg.  HPI     Home Medications Prior to Admission medications   Medication Sig Start Date End Date Taking? Authorizing Provider  albuterol (VENTOLIN HFA) 108 (90 Base) MCG/ACT inhaler Inhale 1-2 puffs into the lungs every 6 (six) hours as needed for wheezing or shortness of breath. 06/25/21   Copland, Gay Filler, MD  ALPRAZolam (XANAX) 1 MG tablet TAKE 1 TABLET BY MOUTH THREE TIMES A DAY AS NEEDED FOR ANXIETY 06/25/21   Copland, Gay Filler, MD  aspirin EC 81 MG tablet Take 162 mg by mouth at bedtime. Swallow whole.    [provider]  Esomeprazole Magnesium (NEXIUM PO) Take 1 tablet by mouth as needed (acid reflux).    [provider]  losartan (COZAAR) 25 MG tablet Take 1 tablet (25 mg total) by mouth daily. 02/18/21   Tobb, Kardie, DO  nitroGLYCERIN (NITROSTAT) 0.4 MG SL tablet Place 1 tablet (0.4 mg total) under the tongue every 5 (five) minutes as needed for chest pain. 02/19/21   Copland, Gay Filler, MD  rosuvastatin (CRESTOR) 10 MG tablet TAKE 1 TABLET BY MOUTH ONCE DAILY AT BEDTIME Patient taking differently: Take 10 mg by mouth at bedtime. 01/28/21   Copland,  Gay Filler, MD  Tetrahydrozoline HCl (VISINE OP) Apply 1 drop to eye as needed (itchy, dryness).    [provider]  Vitamin D, Ergocalciferol, (DRISDOL) 1.25 MG (50000 UNIT) CAPS capsule Take 1 capsule (50,000 Units total) by mouth every 7 (seven) days. 09/02/20   Tobb, Kardie, DO      Allergies    Plavix [clopidogrel bisulfate]; Afrin [nasal spray]; Antihistamines, chlorpheniramine-type; Benadryl [diphenhydramine hcl]; Buprenorphine hcl; Carisoprodol; Codeine; Metoclopramide; Morphine and related; Oxymetazoline; and Pheniramine    Review of Systems   Review of Systems  Constitutional:  Negative for chills and fever.  HENT:  Negative for ear pain and sore throat.   Eyes:  Negative for pain and visual disturbance.  Respiratory:  Negative for cough and shortness of breath.   Cardiovascular:  Positive for chest pain. Negative for palpitations.  Gastrointestinal:  Negative for abdominal pain and vomiting.  Genitourinary:  Negative for dysuria and hematuria.  Musculoskeletal:  Positive for arthralgias. Negative for back pain.  Skin:  Negative for color change and rash.  Neurological:  Negative for seizures and syncope.  All other systems reviewed and are negative.   Physical Exam Updated Vital Signs BP (!) 136/96   Pulse (!) 49   Temp 97.9 F (36.6 C) (Oral)   Resp 14   Ht '6\' 3"'$  (1.905 m)   Wt 86.2 kg  SpO2 100%   BMI 23.75 kg/m  Physical Exam Vitals and nursing note reviewed.  Constitutional:      General: He is not in acute distress.    Appearance: He is well-developed.  HENT:     Head: Normocephalic and atraumatic.  Eyes:     Conjunctiva/sclera: Conjunctivae normal.  Cardiovascular:     Rate and Rhythm: Normal rate and regular rhythm.     Heart sounds: No murmur heard. Pulmonary:     Effort: Pulmonary effort is normal. No respiratory distress.     Breath sounds: Normal breath sounds.  Abdominal:     Palpations: Abdomen is soft.     Tenderness: There is no  abdominal tenderness.  Musculoskeletal:        General: No swelling.     Cervical back: Neck supple.     Comments: Right lower extremity: The leg appears normal, there is some mild tenderness to the lower leg but no deformity appreciated, no edema, normal DP and PT pulses, normal sensation and motor distally  Skin:    General: Skin is warm and dry.     Capillary Refill: Capillary refill takes less than 2 seconds.  Neurological:     Mental Status: He is alert.  Psychiatric:        Mood and Affect: Mood normal.     ED Results / Procedures / Treatments   Labs (all labs ordered are listed, but only abnormal results are displayed) Labs Reviewed  BASIC METABOLIC PANEL - Abnormal; Notable for the following components:      Result Value   Glucose, Bld 132 (*)    Calcium 8.8 (*)    All other components within normal limits  CBC  TROPONIN I (HIGH SENSITIVITY)  TROPONIN I (HIGH SENSITIVITY)    EKG None  Radiology US Venous Img Lower Right (DVT Study)  Result Date: 08/08/2021 CLINICAL DATA:  Right leg pain EXAM: RIGHT LOWER EXTREMITY VENOUS DOPPLER ULTRASOUND TECHNIQUE: Gray-scale sonography with compression, as well as color and duplex ultrasound, were performed to evaluate the deep venous system(s) from the level of the common femoral vein through the popliteal and proximal calf veins. COMPARISON:  None Available. FINDINGS: VENOUS Normal compressibility of the common femoral, superficial femoral, and popliteal veins, as well as the visualized calf veins. Visualized portions of profunda femoral vein and great saphenous vein unremarkable. No filling defects to suggest DVT on grayscale or color Doppler imaging. Doppler waveforms show normal direction of venous flow, normal respiratory plasticity and response to augmentation. Limited views of the contralateral common femoral vein are unremarkable. OTHER None. Limitations: none IMPRESSION: No evidence of right lower extremity DVT. Electronically  Signed   By: Macy Mis M.D.   On: 08/08/2021 14:45   DG Chest 2 View  Result Date: 08/08/2021 CLINICAL DATA:  Left chest pain EXAM: CHEST - 2 VIEW COMPARISON:  02/07/2021 FINDINGS: The heart size and mediastinal contours are within normal limits. Both lungs are clear. The visualized skeletal structures are unremarkable. IMPRESSION: No active cardiopulmonary disease. Electronically Signed   By: Elmer Picker M.D.   On: 08/08/2021 12:53    Procedures Procedures    Medications Ordered in ED Medications  iohexol (OMNIPAQUE) 350 MG/ML injection 100 mL (100 mLs Intravenous Contrast Given 08/08/21 1448)    ED Course/ Medical Decision Making/ A&P                           Medical Decision Making Amount  and/or Complexity of Data Reviewed Labs: ordered. Radiology: ordered.  Risk Prescription drug management.   51 year old gentleman with history of thoracic arctic aneurysm presenting to ER due to concern for chest pain.  A secondary complaint also having some right lower leg pain.  On physical exam well-appearing in no distress, EKG demonstrating sinus rhythm without acute ischemic change.  Initial troponin within normal limits, doubt ACS.  No anemia or electrolyte derangement.  I independently reviewed and interpreted CXR, no acute findings.  Given his history of aortic aneurysm, will check CTA to rule out dissection, enlarging aneurysm or other acute complication.  Ultrasounds negative for DVT.  While awaiting CT, reassessment, repeat troponin, signed out to Dr. Ronnald Nian.          Final Clinical Impression(s) / ED Diagnoses Final diagnoses:  Chest pain, unspecified type  Thoracic aortic aneurysm without rupture, unspecified part [facility])    Rx / DC Orders ED Discharge Orders     None         Lucrezia Starch, MD 08/08/21 1534

## 2021-08-08 NOTE — ED Notes (Signed)
Patient with multiple complaints,, but is asking to leave to go outside. Let him know that since he is still having pain, it would be preferable that he stay in the emergency room.

## 2021-08-08 NOTE — ED Provider Notes (Signed)
Patient here with chest pain.  History of mild aortic dilation, nonobstructive CAD.  He had a CTA coronary study last year that was fairly unremarkable.  Incidentally was found to have a mild aortic dilation of 43 mm.  He is very comfortable on exam.  Troponins undetectable.  CTA dissection study was ordered that showed no evidence for aortic dissection or other acute vascular findings.  Patient given reassurance.  We will have him follow-up with his cardiology team.  Discharged in good condition.   Lennice Sites, DO 08/08/21 1559

## 2021-08-08 NOTE — ED Triage Notes (Signed)
Pt arrives pov, steady gait, c/o left side sharp non-radiating CP on Monday, continued pain intermittently, and right ankle pain x 5 days. Tenderness to ankle noted. Endorses xanax pta, hx of anxiety, aneyrysm

## 2021-08-22 ENCOUNTER — Inpatient Hospital Stay: Payer: Self-pay | Attending: Hematology & Oncology

## 2021-08-22 ENCOUNTER — Inpatient Hospital Stay: Payer: Self-pay

## 2021-08-22 ENCOUNTER — Inpatient Hospital Stay: Payer: Self-pay | Admitting: Family

## 2021-08-22 DIAGNOSIS — D751 Secondary polycythemia: Secondary | ICD-10-CM | POA: Insufficient documentation

## 2021-08-22 DIAGNOSIS — D500 Iron deficiency anemia secondary to blood loss (chronic): Secondary | ICD-10-CM

## 2021-08-22 LAB — CMP (CANCER CENTER ONLY)
ALT: 18 U/L (ref 0–44)
AST: 16 U/L (ref 15–41)
Albumin: 4.3 g/dL (ref 3.5–5.0)
Alkaline Phosphatase: 56 U/L (ref 38–126)
Anion gap: 6 (ref 5–15)
BUN: 14 mg/dL (ref 6–20)
CO2: 28 mmol/L (ref 22–32)
Calcium: 9.1 mg/dL (ref 8.9–10.3)
Chloride: 103 mmol/L (ref 98–111)
Creatinine: 0.81 mg/dL (ref 0.61–1.24)
GFR, Estimated: >60 mL/min (ref >60)
Glucose, Bld: 99 mg/dL (ref 70–99)
Potassium: 4.2 mmol/L (ref 3.5–5.1)
Sodium: 137 mmol/L (ref 135–145)
Total Bilirubin: 0.5 mg/dL (ref 0.3–1.2)
Total Protein: 6.7 g/dL (ref 6.5–8.1)

## 2021-08-22 LAB — IRON AND IRON BINDING CAPACITY (CC-WL,HP ONLY)
Iron: 106 ug/dL (ref 45–182)
Saturation Ratios: 23 % (ref 17.9–39.5)
TIBC: 465 ug/dL — ABNORMAL HIGH (ref 250–450)
UIBC: 359 ug/dL (ref 117–376)

## 2021-08-22 LAB — CBC WITH DIFFERENTIAL (CANCER CENTER ONLY)
Abs Immature Granulocytes: 0.05 10*3/uL (ref 0.00–0.07)
Basophils Absolute: 0.1 10*3/uL (ref 0.0–0.1)
Basophils Relative: 1 %
Eosinophils Absolute: 0.1 10*3/uL (ref 0.0–0.5)
Eosinophils Relative: 1 %
HCT: 48.1 % (ref 39.0–52.0)
Hemoglobin: 16.2 g/dL (ref 13.0–17.0)
Immature Granulocytes: 1 %
Lymphocytes Relative: 17 %
Lymphs Abs: 1.9 10*3/uL (ref 0.7–4.0)
MCH: 30.9 pg (ref 26.0–34.0)
MCHC: 33.7 g/dL (ref 30.0–36.0)
MCV: 91.8 fL (ref 80.0–100.0)
Monocytes Absolute: 0.9 10*3/uL (ref 0.1–1.0)
Monocytes Relative: 8 %
Neutro Abs: 7.7 10*3/uL (ref 1.7–7.7)
Neutrophils Relative %: 72 %
Platelet Count: 228 10*3/uL (ref 150–400)
RBC: 5.24 MIL/uL (ref 4.22–5.81)
RDW: 13.9 % (ref 11.5–15.5)
WBC Count: 10.7 10*3/uL — ABNORMAL HIGH (ref 4.0–10.5)
nRBC: 0 % (ref 0.0–0.2)

## 2021-08-22 LAB — FERRITIN: Ferritin: 18 ng/mL — ABNORMAL LOW (ref 24–336)

## 2021-08-22 NOTE — Patient Instructions (Signed)

## 2021-08-22 NOTE — Progress Notes (Signed)
Stuart Hill presents today for phlebotomy per MD orders. Phlebotomy procedure started at 1337 and ended at 1347. 510 grams removed from lt AC by Hannah, RN using 16g phlebotomy kit.  Patient declined 30 minute post observation.  Patient tolerated procedure well. IV needle removed intact.   

## 2021-08-26 ENCOUNTER — Telehealth: Payer: Self-pay | Admitting: *Deleted

## 2021-08-26 NOTE — Telephone Encounter (Signed)
Per scheduling message Vanessa - called and lvm of upcoming appointments - requested call back to confirm 

## 2021-09-10 ENCOUNTER — Ambulatory Visit (INDEPENDENT_AMBULATORY_CARE_PROVIDER_SITE_OTHER): Payer: Self-pay | Admitting: Cardiology

## 2021-09-10 ENCOUNTER — Encounter: Payer: Self-pay | Admitting: Cardiology

## 2021-09-10 VITALS — BP 134/90 | HR 71 | Ht 75.0 in | Wt 195.2 lb

## 2021-09-10 DIAGNOSIS — F419 Anxiety disorder, unspecified: Secondary | ICD-10-CM

## 2021-09-10 DIAGNOSIS — R079 Chest pain, unspecified: Secondary | ICD-10-CM

## 2021-09-10 DIAGNOSIS — I471 Supraventricular tachycardia: Secondary | ICD-10-CM

## 2021-09-10 DIAGNOSIS — E782 Mixed hyperlipidemia: Secondary | ICD-10-CM

## 2021-09-10 DIAGNOSIS — F32A Depression, unspecified: Secondary | ICD-10-CM

## 2021-09-10 DIAGNOSIS — I2510 Atherosclerotic heart disease of native coronary artery without angina pectoris: Secondary | ICD-10-CM

## 2021-09-10 DIAGNOSIS — I7121 Aneurysm of the ascending aorta, without rupture: Secondary | ICD-10-CM

## 2021-09-10 NOTE — Patient Instructions (Addendum)
Medication Instructions:  Your physician recommends that you continue on your current medications as directed. Please refer to the Current Medication list given to you today.   Please take your blood pressure daily for 2 weeks and send in a MyChart message. Please include heart rates.   HOW TO TAKE YOUR BLOOD PRESSURE: Rest 5 minutes before taking your blood pressure. Don't smoke or drink caffeinated beverages for at least 30 minutes before. Take your blood pressure before (not after) you eat. Sit comfortably with your back supported and both feet on the floor (don't cross your legs). Elevate your arm to heart level on a table or a desk. Use the proper sized cuff. It should fit smoothly and snugly around your bare upper arm. There should be enough room to slip a fingertip under the cuff. The bottom edge of the cuff should be 1 inch above the crease of the elbow. Ideally, take 3 measurements at one sitting and record the average.  Lab Work: NONE If you have labs (blood work) drawn today and your tests are completely normal, you will receive your results only by: [place] (if you have MyChart) OR A paper copy in the mail If you have any lab test that is abnormal or we need to change your treatment, we will call you to review the results.   Testing/Procedures: NONE   Follow-Up: At [facility], you and your health needs are our priority.  As part of our continuing mission to provide you with exceptional heart care, we have created designated Provider Care Teams.  These Care Teams include your primary Cardiologist (physician) and Advanced Practice Providers (APPs -  Physician Assistants and Nurse Practitioners) who all work together to provide you with the care you need, when you need it.  We recommend signing up for the patient portal called "MyChart".  Sign up information is provided on this After Visit Summary.  MyChart is used to connect with patients for Virtual Visits  (Telemedicine).  Patients are able to view lab/test results, encounter notes, upcoming appointments, etc.  Non-urgent messages can be sent to your provider as well.   To learn more about what you can do with MyChart, go to [email or web address].    Your next appointment:   1 year(s)  The format for your next appointment:   In Person  Provider:   Berniece Salines, DO

## 2021-09-10 NOTE — Progress Notes (Addendum)
Cardiology Office Note:    Date:  09/10/2021   ID:  OMARI MCMANAWAY, DOB August 15, 1970, MRN [id number]  PCP:  Darreld Mclean, MD  Cardiologist:  Berniece Salines, DO  Electrophysiologist:  None   Referring MD: Darreld Mclean, MD   No chief complaint on file.  History of Present Illness:    Stuart Hill is a 51 y.o. male with a hx of sinus of Valsalva dilatation which was seen on his CT scan in September 06 2020 (43 mm), subsequent CT scans has not shown any worsening or dilatation,   COPD, hyperlipidemia, polycythemia vera, dilatation of the proximal ascending aorta seen on echocardiogram, minimal coronary artery disease is here today for follow-up visit.  I first saw the patient in August 2022 at that time he was experiencing chest discomfort and has been referred by his primary care provider.  Given the risk factors we will send the patient for a coronary CTA and he also did have some shortness of breath so I sent him for echocardiogram.  Those results were discussed with the patient.   I saw the patient on January 06, 2021 at that time he reported that he had been experiencing intermittent lightheadedness, dizziness and palpitations.  His blood pressure was elevated at his visit that day was started carvedilol 3.125 mg twice daily given his prior I also placed a monitor on the patient.     In the interim I was able to review his monitor which showed paroxysmal supraventricular tachycardia.  He also did not tolerate a beta-blocker and his heart rate was significantly low.  He was then seen in the emergency department at [facility] last Friday because he was experiencing significant chest pain and shortness of breath.  During this time he was noted to have COPD exacerbation he was treated.  He had a repeat echocardiogram which was normal with no significant change in his aneurysm.  Since I saw the patient in January 2023 he has had a visit to the emergency department which was August 08, 2021 for chest pain.  Imaging as well as work-ups are all within normal.  Today he tells me that he is very concerned that he is experiencing significant fatigue, he does have some back pain and he feels that he gets significantly short of breath while he is working.  He feels limited from the recommendation of avoiding significant strenuous isometric activities.  He also admits that he was unable to follow-up with pulmonary for his COPD therefore he is not on any maintenance treatment but as needed inhalers.  He tells me that he notes that he is depressed and thinks experiencing great deal of anxiety about everything that is going on around him.  As he has to be a caregiver for his wife given the fact that she has a few surgeries and is having another hip surgery coming up.  He is interested in disability but I advised the patient that from a cardiac standpoint I am unable to set up any disability as for employment from this standpoint.  But he will discuss with his primary care physician in terms of this aspect.  Past Medical History:  Diagnosis Date   Allergy    Anxiety    Arthritis    Bell's palsy    COPD (chronic obstructive pulmonary disease) ([place])    Erythrocytosis 04/10/2015   Hyperlipidemia    Polycythemia vera ([place]) 05/03/2015    Past Surgical History:  Procedure Laterality Date  DENTAL SURGERY     HAND SURGERY     TONSILLECTOMY Bilateral 04/30/2017   Procedure: TONSILLECTOMY;  Surgeon: Jerrell Belfast, MD;  Location: [facility];  Service: ENT;  Laterality: Bilateral;    Current Medications: Current Meds  Medication Sig   albuterol (VENTOLIN HFA) 108 (90 Base) MCG/ACT inhaler Inhale 1-2 puffs into the lungs every 6 (six) hours as needed for wheezing or shortness of breath.   ALPRAZolam (XANAX) 1 MG tablet TAKE 1 TABLET BY MOUTH THREE TIMES A DAY AS NEEDED FOR ANXIETY   aspirin EC 81 MG tablet Take 162 mg by mouth at bedtime. Swallow whole.   losartan  (COZAAR) 25 MG tablet Take 1 tablet (25 mg total) by mouth daily.   nitroGLYCERIN (NITROSTAT) 0.4 MG SL tablet Place 1 tablet (0.4 mg total) under the tongue every 5 (five) minutes as needed for chest pain.   Tetrahydrozoline HCl (VISINE OP) Apply 1 drop to eye as needed (itchy, dryness).     Allergies:   Plavix [clopidogrel bisulfate]; Afrin [nasal spray]; Antihistamines, chlorpheniramine-type; Benadryl [diphenhydramine hcl]; Buprenorphine hcl; Carisoprodol; Codeine; Metoclopramide; Morphine and related; Oxymetazoline; and Pheniramine   Social History   Socioeconomic History   Marital status: Married    Spouse name: Not on file   Number of children: Not on file   Years of education: Not on file   Highest education level: Not on file  Occupational History   Not on file  Tobacco Use   Smoking status: Every Day    Packs/day: 0.25    Years: 24.00    Total pack years: 6.00    Types: Cigarettes   Smokeless tobacco: Never  Vaping Use   Vaping Use: Never used  Substance and Sexual Activity   Alcohol use: Yes    Alcohol/week: 1.0 standard drink of alcohol    Types: 1 Cans of beer per week    Comment: 6-10 beer a week   Drug use: Yes    Types: Other-see comments, Marijuana    Comment: Marijuana every other day.   Sexual activity: Yes  Other Topics Concern   Not on file  Social History Narrative   Not on file   Social Determinants of Health   Financial Resource Strain: Medium Risk (04/29/2021)   Overall Financial Resource Strain (CARDIA)    Difficulty of Paying Living Expenses: Somewhat hard  Food Insecurity: No Food Insecurity (04/29/2021)   Hunger Vital Sign    Worried About Running Out of Food in the Last Year: Never true    Ran Out of Food in the Last Year: Never true  Transportation Needs: No Transportation Needs (04/29/2021)   PRAPARE - Hydrologist (Medical): No    Lack of Transportation (Non-Medical): No  Physical Activity: Not on file   Stress: Not on file  Social Connections: Not on file     Family History: The patient's family history includes COPD in his mother; Depression in his brother; Fibromyalgia in his mother; Heart disease in his brother, father, maternal grandfather, maternal grandmother, paternal grandfather, and paternal grandmother; Stroke in his father, maternal grandfather, maternal grandmother, paternal aunt, paternal grandfather, and paternal grandmother.  ROS:   Review of Systems  Constitution: Negative for decreased appetite, fever and weight gain.  HENT: Negative for congestion, ear discharge, hoarse voice and sore throat.   Eyes: Negative for discharge, redness, vision loss in right eye and visual halos.  Cardiovascular: Negative for chest pain, dyspnea on exertion, leg swelling,  orthopnea and palpitations.  Respiratory: Negative for cough, hemoptysis, shortness of breath and snoring.   Endocrine: Negative for heat intolerance and polyphagia.  Hematologic/Lymphatic: Negative for bleeding problem. Does not bruise/bleed easily.  Skin: Negative for flushing, nail changes, rash and suspicious lesions.  Musculoskeletal: Negative for arthritis, joint pain, muscle cramps, myalgias, neck pain and stiffness.  Gastrointestinal: Negative for abdominal pain, bowel incontinence, diarrhea and excessive appetite.  Genitourinary: Negative for decreased libido, genital sores and incomplete emptying.  Neurological: Negative for brief paralysis, focal weakness, headaches and loss of balance.  Psychiatric/Behavioral: Negative for altered mental status, depression and suicidal ideas.  Allergic/Immunologic: Negative for HIV exposure and persistent infections.    EKGs/Labs/Other Studies Reviewed:    The following studies were reviewed today:   EKG:  The ekg ordered today demonstrates sinus rhythm  Chest CTA 08/08/2021 CLINICAL DATA:  Sharp left-sided chest pain. Persistent intermittent pain. Acute aortic syndrome  suspected.   EXAM: CT ANGIOGRAPHY CHEST, ABDOMEN AND PELVIS   TECHNIQUE: Non-contrast CT of the chest was initially obtained.   Multidetector CT imaging through the chest, abdomen and pelvis was performed using the standard protocol during bolus administration of intravenous contrast. Multiplanar reconstructed images and MIPs were obtained and reviewed to evaluate the vascular anatomy.   RADIATION DOSE REDUCTION: This exam was performed according to the departmental dose-optimization program which includes automated exposure control, adjustment of the mA and/or kV according to patient size and/or use of iterative reconstruction technique.   CONTRAST:  172m OMNIPAQUE IOHEXOL 350 MG/ML SOLN   COMPARISON:  CT a of the chest, abdomen and pelvis 02/07/2021.   FINDINGS: CTA CHEST FINDINGS   Cardiovascular: Heart size is normal. The aortic arch thoracic aorta are within limits. Great vessel origins are within normal limits. The descending thoracic aorta is unremarkable.   Pulmonary artery opacification is excellent. No filling defects are present. Pulmonary artery size is normal.   Mediastinum/Nodes: Subcentimeter paratracheal lymph nodes are present. No significant adenopathy present. Soft Gus is within normal limits. The thoracic inlet is normal.   Lungs/Pleura: Minimal paraseptal emphysematous change again noted. Lungs are clear. No nodule, mass, or airspace disease present. No significant pleural disease is present.   Musculoskeletal: No chest wall abnormality. No acute or significant osseous findings.   Review of the MIP images confirms the above findings.   CTA ABDOMEN AND PELVIS FINDINGS   VASCULAR   Aorta: Minimal atherosclerotic changes are present in the distal aorta just above the bifurcation. No significant aneurysm or change.   Celiac: Patent without evidence of aneurysm, dissection, vasculitis or significant stenosis.   SMA: Patent without evidence of  aneurysm, dissection, vasculitis or significant stenosis.   Renals: Both renal arteries are patent without evidence of aneurysm, dissection, vasculitis, fibromuscular dysplasia or significant stenosis.   IMA: Patent without evidence of aneurysm, dissection, vasculitis or significant stenosis.   Inflow: Atherosclerotic changes extend into the proximal iliac arteries. No aneurysm or stenosis is present. No significant interval change. Branch vessels are within normal limits.   Veins: No obvious venous abnormality within the limitations of this arterial phase study.   Review of the MIP images confirms the above findings.   NON-VASCULAR   Hepatobiliary: No focal liver abnormality is seen. No gallstones, gallbladder wall thickening, or biliary dilatation.   Pancreas: Unremarkable. No pancreatic ductal dilatation or surrounding inflammatory changes.   Spleen: Normal in size without focal abnormality.   Adrenals/Urinary Tract: Adrenal glands are unremarkable. Kidneys are normal, without renal calculi, focal lesion, or  hydronephrosis. Bladder is unremarkable.   Stomach/Bowel: Stomach is within normal limits. Appendix appears normal. No evidence of bowel wall thickening, distention, or inflammatory changes.   Lymphatic: No significant abdominal or pelvic adenopathy is present.   Reproductive: Prostate is unremarkable.   Other: No abdominal wall hernia or abnormality. No abdominopelvic ascites.   Musculoskeletal: No acute or significant osseous findings.   Review of the MIP images confirms the above findings.   IMPRESSION: 1. No evidence for aortic dissection other acute vascular findings the chest, abdomen or pelvis 2. Stable minimal aortic atherosclerosis. Aortic Atherosclerosis (ICD10-I70.0). 3. Stable minimal paraseptal emphysematous change.    Recent Labs: 04/21/2021: TSH 1.980 08/22/2021: ALT 18; BUN 14; Creatinine 0.81; Hemoglobin 16.2; Platelet Count 228;  Potassium 4.2; Sodium 137  Recent Lipid Panel    Component Value Date/Time   CHOL 205 (H) 07/26/2020 1131   CHOL 203 (H) 11/03/2019 1052   TRIG 178.0 (H) 07/26/2020 1131   HDL 75.80 07/26/2020 1131   HDL 65 11/03/2019 1052   CHOLHDL 3 07/26/2020 1131   VLDL 35.6 07/26/2020 1131   LDLCALC 94 07/26/2020 1131   LDLCALC 121 (H) 11/03/2019 1052    Physical Exam:    VS:  BP (!) 134/90   Pulse 71   Ht '6\' 3"'$  (1.905 m)   Wt 195 lb 3.2 oz (88.5 kg)   PF 97 L/min   BMI 24.40 kg/m     Wt Readings from Last 3 Encounters:  09/10/21 195 lb 3.2 oz (88.5 kg)  08/08/21 190 lb (86.2 kg)  06/25/21 191 lb (86.6 kg)     GEN: Well nourished, well developed in no acute distress HEENT: Normal NECK: No JVD; No carotid bruits LYMPHATICS: No lymphadenopathy CARDIAC: S1S2 noted,RRR, no murmurs, rubs, gallops RESPIRATORY:  Clear to auscultation without rales, wheezing or rhonchi  ABDOMEN: Soft, non-tender, non-distended, +bowel sounds, no guarding. EXTREMITIES: No edema, No cyanosis, no clubbing MUSCULOSKELETAL:  No deformity  SKIN: Warm and dry NEUROLOGIC:  Alert and oriented x 3, non-focal PSYCHIATRIC:  Normal affect, good insight  ASSESSMENT:    1. Minimal CAD   2. Depression, unspecified depression type   3. Aneurysm of ascending aorta without rupture ([place])   4. Anxiety   5. Atypical chest pain   6. Paroxysmal SVT (supraventricular tachycardia) (H[facility])   7. Other hyperlipidemia   8. Mixed hyperlipidemia    PLAN:    He had a lot of questions about his CT scan from 2022 with his sinus of Valsalva noted to be 43 mm.  Explained to the patient that this has not increased since.  Explained to him that we are not in any indication for surgery but he this time would prefer to speak with her CT surgeon therefore I am going to respect the patient preference and refer him to our CT surgery colleagues.  Unfortunately he was not able to attend the pulmonary man that had been referred by his PCP.   Explained to the patient that he will need to see pulmonary as it would be in his best interest if he is treated appropriately for his COPD.  I do believe his fatigue is contributory from his major depressive disorder that is not being treated.  I am going to refer him to behavioral health. We are also going to give the patient information about local walk-in [facility] health treatment centers.  He is interested in disability but I advised the patient that from a cardiac standpoint I am unable to set up  any disability as for employment from this standpoint.  But he will discuss with his primary care physician in terms of this aspect.  Coronary artery disease which is minimal no angina symptoms. The chest pain is atypical which I think is likely musculoskeletal.  His blood pressure is up-and-down.  I have asked the patient to take his blood pressure medication as prescribed however take his blood pressure daily and send me that information after 2 weeks.  I will be able to review it and adjust his medications as appropriate.  The patient is in agreement with the above plan. The patient left the office in stable condition.  The patient will follow up in 12 months or sooner if needed.   Medication Adjustments/Labs and Tests Ordered: Current medicines are reviewed at length with the patient today.  Concerns regarding medicines are outlined above.  Orders Placed This Encounter  Procedures   Ambulatory referral to [place]   Ambulatory referral to Cardiothoracic Surgery   EKG 12-Lead   No orders of the defined types were placed in this encounter.   Patient Instructions  Medication Instructions:  Your physician recommends that you continue on your current medications as directed. Please refer to the Current Medication list given to you today.  *If you need a refill on your cardiac medications before your next appointment, please call your pharmacy*   Lab Work: NONE If you have labs  (blood work) drawn today and your tests are completely normal, you will receive your results only by: [place] (if you have MyChart) OR A paper copy in the mail If you have any lab test that is abnormal or we need to change your treatment, we will call you to review the results.   Testing/Procedures: NONE   Follow-Up: At [facility], you and your health needs are our priority.  As part of our continuing mission to provide you with exceptional heart care, we have created designated Provider Care Teams.  These Care Teams include your primary Cardiologist (physician) and Advanced Practice Providers (APPs -  Physician Assistants and Nurse Practitioners) who all work together to provide you with the care you need, when you need it.  We recommend signing up for the patient portal called "MyChart".  Sign up information is provided on this After Visit Summary.  MyChart is used to connect with patients for Virtual Visits (Telemedicine).  Patients are able to view lab/test results, encounter notes, upcoming appointments, etc.  Non-urgent messages can be sent to your provider as well.   To learn more about what you can do with MyChart, go to [email or web address].    Your next appointment:   1 year(s)  The format for your next appointment:   In Person  Provider:   Berniece Salines, DO     Adopting a Healthy Lifestyle.  Know what a healthy weight is for you (roughly BMI <25) and aim to maintain this   Aim for 7+ servings of fruits and vegetables daily   65-80+ fluid ounces of water or unsweet tea for healthy kidneys   Limit to max 1 drink of alcohol per day; avoid smoking/tobacco   Limit animal fats in diet for cholesterol and heart health - choose grass fed whenever available   Avoid highly processed foods, and foods high in saturated/trans fats   Aim for low stress - take time to unwind and care for your mental health   Aim for 150 min of moderate intensity exercise weekly for  heart health, and  weights twice weekly for bone health   Aim for 7-9 hours of sleep daily   When it comes to diets, agreement about the perfect plan isnt easy to find, even among the experts. Experts at the [employer] developed an idea known as the Healthy Eating Plate. Just imagine a plate divided into logical, healthy portions.   The emphasis is on diet quality:   Load up on vegetables and fruits - one-half of your plate: Aim for color and variety, and remember that potatoes dont count.   Go for whole grains - one-quarter of your plate: Whole wheat, barley, wheat berries, quinoa, oats, brown rice, and foods made with them. If you want pasta, go with whole wheat pasta.   Protein power - one-quarter of your plate: Fish, chicken, beans, and nuts are all healthy, versatile protein sources. Limit red meat.   The diet, however, does go beyond the plate, offering a few other suggestions.   Use healthy plant oils, such as olive, canola, soy, corn, sunflower and peanut. Check the labels, and avoid partially hydrogenated oil, which have unhealthy trans fats.   If youre thirsty, drink water. Coffee and tea are good in moderation, but skip sugary drinks and limit milk and dairy products to one or two daily servings.   The type of carbohydrate in the diet is more important than the amount. Some sources of carbohydrates, such as vegetables, fruits, whole grains, and beans-are healthier than others.   Finally, stay active  Signed, Berniece Salines, DO  09/10/2021 9:58 AM    [place]

## 2021-09-17 ENCOUNTER — Other Ambulatory Visit: Payer: Self-pay | Admitting: Family Medicine

## 2021-09-17 DIAGNOSIS — J439 Emphysema, unspecified: Secondary | ICD-10-CM

## 2021-09-17 NOTE — Progress Notes (Signed)
Pt's wife being seen today- he also wanted to discuss a right side pain that occurred last week- now resolved.  He wanted to know what this might be and what further evaluation might be needed. Advised that he will need an appt so we can evaluate this issue and we will get him set up.  He will seek care if any change or worsening in the meantime  JC

## 2021-09-19 ENCOUNTER — Telehealth: Payer: Self-pay | Admitting: Family Medicine

## 2021-09-19 MED ORDER — TRAMADOL HCL 50 MG PO TABS: 50.0000 mg | ORAL_TABLET | Freq: Three times a day (TID) | ORAL | 0 refills | Status: AC | PRN

## 2021-09-19 NOTE — Addendum Note (Signed)
Addended by: Lamar Blinks C on: 09/19/2021 01:28 PM   Modules accepted: Orders

## 2021-09-19 NOTE — Telephone Encounter (Signed)
Okay for refill?  

## 2021-09-19 NOTE — Telephone Encounter (Signed)
Spoke with wife [name] this am. She states Armour is having a lot of back pain.  They have an appt to see me on Monday.  I have not yet evaluated him for this pain.  Asked them to please seek care at the ER if he is not doing ok in the meantime as I have not evaluated this issue I did provide an rx for 15 tramadol which he has tolerated in the past

## 2021-09-19 NOTE — Telephone Encounter (Addendum)
[name] (spouse) called stating that pt is trying to get a temporary supply to get him to his Monday appt since he is in severe pain:  Medication:   traMADol (ULTRAM) 50 MG tablet [[id number]]   Has the patient contacted their pharmacy? No. (If no, request that the patient contact the pharmacy for the refill.) (If yes, when and what did the pharmacy advise?)  Preferred Pharmacy (with phone number or street name):   [facility] [place], [place] - [place]  [address] [address]  Phone:  [phone]  Fax:  [phone]   Agent: Please be advised that RX refills may take up to 3 business days. We ask that you follow-up with your pharmacy.

## 2021-09-21 NOTE — Progress Notes (Unsigned)
[place] at [facility] [place], [place], [place] [address] [phone]  Date:  09/22/2021   Name:  Stuart Hill   DOB:  01/12/1971   MRN:  [id number]  PCP:  Darreld Mclean, MD    Chief Complaint: Referral (Discuss referral and concerns)   History of Present Illness:  Stuart Hill is a 51 y.o. 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 on echocardiogram, minimal coronary artery disease, mood disorder/anxiety  Today he notes he has back pain that starts at the TL junction and runs around his right side It will feel like a "muscle camp", may come and go and be quite severe at times He also notes that his abdomen has been bloated - appears distended  Laying on the right side is painful- he can lie on his left side ok He has noted this off and on about 3 weeks ago but worse the last 2 weeks He will have to hold very still and curl himself up when the pain is more severe  No vomiting Stools a bit less often than typical Some nausea He notes decreased appetite   He has tried some tramadol for the pain- it did not really help  He also had some hydrocodone that he used recently for dental work but it did not help wither   Patient Active Problem List   Diagnosis Date Noted   Paroxysmal SVT (supraventricular tachycardia) ([place])    Emphysema of lung ([place])    Palpitations 01/09/2021   Minimal CAD 01/09/2021   Mixed hyperlipidemia 01/09/2021   Ascending aorta dilatation ([place])  01/09/2021   Allergy 08/19/2020   Anxiety 08/19/2020   Arthritis 08/19/2020   Bell's palsy 08/19/2020   Chest pain 07/26/2020   Family history of early CAD 07/26/2020   DOE (dyspnea on exertion) 12/14/2019   Angina pectoris ([place]) 11/03/2019   Tobacco abuse 11/03/2019   Other hyperlipidemia 11/03/2019   Tonsillar hypertrophy 04/30/2017   Obstructive sleep apnea 04/23/2017   Polycythemia, secondary 09/29/2016   Polycythemia vera ([place]) 05/03/2015   Hemoptysis 04/03/2015   Condylomata acuminata 01/18/2015   Generalized anxiety disorder 03/05/2014    Past Medical History:  Diagnosis Date   Allergy    Anxiety    Arthritis    Bell's palsy    COPD (chronic obstructive pulmonary disease) ([place])    Erythrocytosis 04/10/2015   Hyperlipidemia    Polycythemia vera ([place]) 05/03/2015    Past Surgical History:  Procedure Laterality Date   DENTAL SURGERY     HAND SURGERY     TONSILLECTOMY Bilateral 04/30/2017   Procedure: TONSILLECTOMY;  Surgeon: Jerrell Belfast, MD;  Location: [place];  Service: ENT;  Laterality: Bilateral;    Social History   Tobacco Use   Smoking status: Every Day    Packs/day: 0.25    Years: 24.00    Total pack years: 6.00    Types: Cigarettes   Smokeless tobacco: Never  Vaping Use  Vaping Use: Never used  Substance Use Topics   Alcohol use: Yes    Alcohol/week: 1.0 standard drink of alcohol    Types: 1 Cans of beer per week    Comment: 6-10 beer a week   Drug use: Yes    Types: Other-see comments, Marijuana    Comment: Marijuana every other day.    Family History  Problem Relation Age of Onset   COPD Mother    Fibromyalgia Mother    Heart disease Father    Stroke Father    Depression Brother    Heart disease Brother    Heart disease Maternal Grandmother    Stroke Maternal Grandmother    Heart disease Maternal Grandfather    Stroke Maternal Grandfather    Heart disease Paternal Grandmother    Stroke Paternal Grandmother     Heart disease Paternal Grandfather    Stroke Paternal Grandfather    Stroke Paternal Aunt     Allergies  Allergen Reactions   Plavix [Clopidogrel Bisulfate] Other (See Comments)    Confusion, lethargy, slurred speech   Afrin [Nasal Spray] Hives   Antihistamines, Chlorpheniramine-Type Hives   Benadryl [Diphenhydramine Hcl] Hives   Buprenorphine Hcl Hives   Carisoprodol Hives   Codeine Hives   Metoclopramide Hives   Morphine And Related Hives   Oxymetazoline Hives   Pheniramine Hives and Rash    Medication list has been reviewed and updated.  Current Outpatient Medications on File Prior to Visit  Medication Sig Dispense Refill   albuterol (VENTOLIN HFA) 108 (90 Base) MCG/ACT inhaler Inhale 1-2 puffs into the lungs every 6 (six) hours as needed for wheezing or shortness of breath. 18 g 3   ALPRAZolam (XANAX) 1 MG tablet TAKE 1 TABLET BY MOUTH THREE TIMES A DAY AS NEEDED FOR ANXIETY 90 tablet 2   aspirin EC 81 MG tablet Take 162 mg by mouth at bedtime. Swallow whole.     losartan (COZAAR) 25 MG tablet Take 1 tablet (25 mg total) by mouth daily. 90 tablet 3   nitroGLYCERIN (NITROSTAT) 0.4 MG SL tablet Place 1 tablet (0.4 mg total) under the tongue every 5 (five) minutes as needed for chest pain. 25 tablet 6   Tetrahydrozoline HCl (VISINE OP) Apply 1 drop to eye as needed (itchy, dryness).     traMADol (ULTRAM) 50 MG tablet Take 1 tablet (50 mg total) by mouth every 8 (eight) hours as needed for up to 5 days. 15 tablet 0   Esomeprazole Magnesium (NEXIUM PO) Take 1 tablet by mouth as needed (acid reflux). (Patient not taking: Reported on 09/10/2021)     rosuvastatin (CRESTOR) 10 MG tablet TAKE 1 TABLET BY MOUTH ONCE DAILY AT BEDTIME (Patient not taking: Reported on 09/10/2021) 90 tablet 1   No current facility-administered medications on file prior to visit.    Review of Systems:  As per HPI- otherwise negative.   Physical Examination: Vitals:   09/22/21 1439  BP: 124/80  Pulse:  76  Resp: 18  Temp: 97.8 F (36.6 C)  SpO2: 96%   Vitals:   09/22/21 1439  Weight: 195 lb (88.5 kg)  Height: '6\' 3"'$  (1.905 m)   Body mass index is 24.37 kg/m. Ideal Body Weight: Weight in (lb) to have BMI = 25: 199.6  GEN: no acute distress. Tall build, looks well but concerned about his sx HEENT: Atraumatic, Normocephalic.  Ears and Nose: No external deformity. CV: RRR, No M/G/R. No JVD. No thrill. No extra heart sounds. PULM: CTA  B, no wheezes, crackles, rhonchi. No retractions. No resp. distress. No accessory muscle use. ABD: Soft, No rebound. No HSM.  Normal bowel sounds Minimal discomfort in the right mid abdomen  No tenderness with percussion of the vertebrae or palpation of the paraspinous muscles  EXTR: No c/c/e PSYCH: Normally interactive. Conversant.  When pt stands up belly appears distended Cannot appreciate an inguinal hernia    Assessment and Plan: Right flank pain Pt seen today with flank pain as above Advised that a CT scan and labs are indicated for further eval However as pt is self pay he prefer the ER- he is not able to pay "up front" Directed him to the ER of his choice and he plans to go today   Signed Lamar Blinks, MD

## 2021-09-22 ENCOUNTER — Other Ambulatory Visit: Payer: Self-pay

## 2021-09-22 ENCOUNTER — Ambulatory Visit: Payer: Self-pay | Admitting: Family

## 2021-09-22 ENCOUNTER — Emergency Department (HOSPITAL_BASED_OUTPATIENT_CLINIC_OR_DEPARTMENT_OTHER)
Admission: EM | Admit: 2021-09-22 | Discharge: 2021-09-22 | Disposition: A | Discharge status: Home / Self Care | Payer: Self-pay | Attending: Emergency Medicine | Admitting: Emergency Medicine

## 2021-09-22 ENCOUNTER — Encounter (HOSPITAL_BASED_OUTPATIENT_CLINIC_OR_DEPARTMENT_OTHER): Payer: Self-pay | Admitting: Emergency Medicine

## 2021-09-22 ENCOUNTER — Emergency Department (HOSPITAL_BASED_OUTPATIENT_CLINIC_OR_DEPARTMENT_OTHER): Payer: Self-pay

## 2021-09-22 ENCOUNTER — Ambulatory Visit (INDEPENDENT_AMBULATORY_CARE_PROVIDER_SITE_OTHER): Payer: Self-pay | Admitting: Family Medicine

## 2021-09-22 VITALS — BP 124/80 | HR 76 | Temp 97.8°F | Resp 18 | Ht 75.0 in | Wt 195.0 lb

## 2021-09-22 DIAGNOSIS — D72829 Elevated white blood cell count, unspecified: Secondary | ICD-10-CM | POA: Insufficient documentation

## 2021-09-22 DIAGNOSIS — R109 Unspecified abdominal pain: Secondary | ICD-10-CM

## 2021-09-22 DIAGNOSIS — Z7982 Long term (current) use of aspirin: Secondary | ICD-10-CM | POA: Insufficient documentation

## 2021-09-22 DIAGNOSIS — J449 Chronic obstructive pulmonary disease, unspecified: Secondary | ICD-10-CM | POA: Insufficient documentation

## 2021-09-22 DIAGNOSIS — M5410 Radiculopathy, site unspecified: Secondary | ICD-10-CM | POA: Insufficient documentation

## 2021-09-22 LAB — COMPREHENSIVE METABOLIC PANEL
ALT: 19 U/L (ref 0–44)
AST: 20 U/L (ref 15–41)
Albumin: 4.3 g/dL (ref 3.5–5.0)
Alkaline Phosphatase: 52 U/L (ref 38–126)
Anion gap: 8 (ref 5–15)
BUN: 9 mg/dL (ref 6–20)
CO2: 25 mmol/L (ref 22–32)
Calcium: 8.7 mg/dL — ABNORMAL LOW (ref 8.9–10.3)
Chloride: 103 mmol/L (ref 98–111)
Creatinine, Ser: 0.72 mg/dL (ref 0.61–1.24)
GFR, Estimated: >60 mL/min (ref >60)
Glucose, Bld: 95 mg/dL (ref 70–99)
Potassium: 3.9 mmol/L (ref 3.5–5.1)
Sodium: 136 mmol/L (ref 135–145)
Total Bilirubin: 0.8 mg/dL (ref 0.3–1.2)
Total Protein: 7.7 g/dL (ref 6.5–8.1)

## 2021-09-22 LAB — CBC WITH DIFFERENTIAL/PLATELET
Abs Immature Granulocytes: 0.04 10*3/uL (ref 0.00–0.07)
Basophils Absolute: 0.1 10*3/uL (ref 0.0–0.1)
Basophils Relative: 1 %
Eosinophils Absolute: 0.1 10*3/uL (ref 0.0–0.5)
Eosinophils Relative: 1 %
HCT: 50.5 % (ref 39.0–52.0)
Hemoglobin: 17.4 g/dL — ABNORMAL HIGH (ref 13.0–17.0)
Immature Granulocytes: 0 %
Lymphocytes Relative: 17 %
Lymphs Abs: 1.9 10*3/uL (ref 0.7–4.0)
MCH: 31.4 pg (ref 26.0–34.0)
MCHC: 34.5 g/dL (ref 30.0–36.0)
MCV: 91 fL (ref 80.0–100.0)
Monocytes Absolute: 0.8 10*3/uL (ref 0.1–1.0)
Monocytes Relative: 7 %
Neutro Abs: 8.4 10*3/uL — ABNORMAL HIGH (ref 1.7–7.7)
Neutrophils Relative %: 74 %
Platelets: 297 10*3/uL (ref 150–400)
RBC: 5.55 MIL/uL (ref 4.22–5.81)
RDW: 13.3 % (ref 11.5–15.5)
WBC: 11.4 10*3/uL — ABNORMAL HIGH (ref 4.0–10.5)
nRBC: 0 % (ref 0.0–0.2)

## 2021-09-22 LAB — URINALYSIS, ROUTINE W REFLEX MICROSCOPIC
Bilirubin Urine: NEGATIVE
Glucose, UA: NEGATIVE mg/dL
Hgb urine dipstick: NEGATIVE
Ketones, ur: NEGATIVE mg/dL
Leukocytes,Ua: NEGATIVE
Nitrite: NEGATIVE
Protein, ur: NEGATIVE mg/dL
Specific Gravity, Urine: 1.02 (ref 1.005–1.030)
pH: 5.5 (ref 5.0–8.0)

## 2021-09-22 LAB — LIPASE, BLOOD: Lipase: 27 U/L (ref 11–51)

## 2021-09-22 MED ORDER — ORPHENADRINE CITRATE ER 100 MG PO TB12: 100.0000 mg | ORAL_TABLET | Freq: Two times a day (BID) | ORAL | 0 refills | Status: AC

## 2021-09-22 MED ORDER — OMEPRAZOLE 20 MG PO CPDR: 20.0000 mg | DELAYED_RELEASE_CAPSULE | Freq: Every day | ORAL | 0 refills | Status: DC

## 2021-09-22 MED ORDER — DICLOFENAC SODIUM 1 % EX GEL: 2.0000 g | Freq: Four times a day (QID) | CUTANEOUS | 0 refills | Status: DC

## 2021-09-22 MED ORDER — LIDOCAINE 5 % EX PTCH
1.0000 | MEDICATED_PATCH | CUTANEOUS | Status: DC
  Administered 2021-09-22: 1 via TRANSDERMAL
  Filled 2021-09-22: qty 1

## 2021-09-22 NOTE — Discharge Instructions (Signed)
Follow up with GI, call to schedule an appointment. Consider follow up with [facility] or [place]. Take Norflex as needed as prescribed for muscle spasms.  Take Prilosec daily with concern for abdominal pain.  Apply Voltaren gel to your back as needed as prescribed for pain.

## 2021-09-22 NOTE — ED Triage Notes (Signed)
Pt c/o right sided flank pain that radiated right side of abdomen x "weeks" Also endorses abdominal bloating. Denies urinary sx, n/v/d. States feels similar as previous kidney stone.

## 2021-09-22 NOTE — Patient Instructions (Signed)
Good to see you today- please proceed to the ER of your choice for further evaluation [facility] might be a good option for you  Address: [address] [place], [place], [place] [address] Phone: [phone]

## 2021-09-22 NOTE — ED Provider Notes (Signed)
[facility] EMERGENCY DEPARTMENT Provider Note   CSN: [id number] Arrival date & time: 09/22/21  1524     History  Chief Complaint  Patient presents with   Flank Pain    Stuart Hill is a 51 y.o. male.  51 year old male presents with complaint of right flank pain for the past several weeks.  Pain is worse with lying on his right side, coughing, sneezing, movement.  Also notes that he has noted swelling or bloating on the right side of his abdomen, worse after he eats and is able to only tolerate applesauce at this point.  Denies nausea, vomiting, changes in bowel or bladder habits.  Denies regular use of NSAIDs or concerns or history of peptic ulcer disease.  Seen by PCP today, deferred to emergency room for further work-up. Past medical history of kidney stones, polycythemia vera, hyperlipidemia, anxiety, COPD, sinus of Valsalva dilatation.       Home Medications Prior to Admission medications   Medication Sig Start Date End Date Taking? Authorizing Provider  diclofenac Sodium (VOLTAREN) 1 % GEL Apply 2 g topically 4 (four) times daily. 09/22/21  Yes Tacy Learn, PA-C  omeprazole (PRILOSEC) 20 MG capsule Take 1 capsule (20 mg total) by mouth daily. 09/22/21 10/22/21 Yes Tacy Learn, PA-C  orphenadrine (NORFLEX) 100 MG tablet Take 1 tablet (100 mg total) by mouth 2 (two) times daily for 10 days. 09/22/21 10/02/21 Yes Tacy Learn, PA-C  albuterol (VENTOLIN HFA) 108 (90 Base) MCG/ACT inhaler Inhale 1-2 puffs into the lungs every 6 (six) hours as needed for wheezing or shortness of breath. 06/25/21   Copland, Gay Filler, MD  ALPRAZolam (XANAX) 1 MG tablet TAKE 1 TABLET BY MOUTH THREE TIMES A DAY AS NEEDED FOR ANXIETY 06/25/21   Copland, Gay Filler, MD  aspirin EC 81 MG tablet Take 162 mg by mouth at bedtime. Swallow whole.    [provider]  Esomeprazole Magnesium (NEXIUM PO) Take 1 tablet by mouth as needed (acid reflux). Patient not taking: Reported on  09/10/2021    [provider]  losartan (COZAAR) 25 MG tablet Take 1 tablet (25 mg total) by mouth daily. 02/18/21   Tobb, Kardie, DO  nitroGLYCERIN (NITROSTAT) 0.4 MG SL tablet Place 1 tablet (0.4 mg total) under the tongue every 5 (five) minutes as needed for chest pain. 02/19/21   Copland, Gay Filler, MD  rosuvastatin (CRESTOR) 10 MG tablet TAKE 1 TABLET BY MOUTH ONCE DAILY AT BEDTIME Patient not taking: Reported on 09/10/2021 01/28/21   Copland, Gay Filler, MD  Tetrahydrozoline HCl (VISINE OP) Apply 1 drop to eye as needed (itchy, dryness).    [provider]  traMADol (ULTRAM) 50 MG tablet Take 1 tablet (50 mg total) by mouth every 8 (eight) hours as needed for up to 5 days. 09/19/21 09/24/21  Copland, Gay Filler, MD      Allergies    Plavix [clopidogrel bisulfate]; Afrin [nasal spray]; Antihistamines, chlorpheniramine-type; Benadryl [diphenhydramine hcl]; Buprenorphine hcl; Carisoprodol; Codeine; Metoclopramide; Morphine and related; Oxymetazoline; and Pheniramine    Review of Systems   Review of Systems Negative except as per HPI Physical Exam Updated Vital Signs BP (!) 158/98   Pulse 65   Temp 98.7 F (37.1 C)   Resp 18   SpO2 99%  Physical Exam Vitals and nursing note reviewed.  Constitutional:      General: He is not in acute distress.    Appearance: He is well-developed. He is not diaphoretic.  HENT:  Head: Normocephalic and atraumatic.  Cardiovascular:     Rate and Rhythm: Normal rate and regular rhythm.     Pulses: Normal pulses.     Heart sounds: Normal heart sounds.  Pulmonary:     Effort: Pulmonary effort is normal.     Breath sounds: Normal breath sounds.  Abdominal:     Palpations: Abdomen is soft.     Tenderness: There is no abdominal tenderness. There is no right CVA tenderness, left CVA tenderness or guarding.  Musculoskeletal:        General: Tenderness present.     Cervical back: No bony tenderness.     Thoracic back: No bony tenderness.      Lumbar back: No bony tenderness.       Back:     Right lower leg: No edema.     Left lower leg: No edema.  Skin:    General: Skin is warm and dry.     Findings: No erythema or rash.  Neurological:     Mental Status: He is alert and oriented to person, place, and time.     Motor: No weakness.     Gait: Gait normal.  Psychiatric:        Behavior: Behavior normal.     ED Results / Procedures / Treatments   Labs (all labs ordered are listed, but only abnormal results are displayed) Labs Reviewed  COMPREHENSIVE METABOLIC PANEL - Abnormal; Notable for the following components:      Result Value   Calcium 8.7 (*)    All other components within normal limits  CBC WITH DIFFERENTIAL/PLATELET - Abnormal; Notable for the following components:   WBC 11.4 (*)    Hemoglobin 17.4 (*)    Neutro Abs 8.4 (*)    All other components within normal limits  LIPASE, BLOOD  URINALYSIS, ROUTINE W REFLEX MICROSCOPIC    EKG None  Radiology CT Renal Stone Study  Result Date: 09/22/2021 CLINICAL DATA:  Flank pain, kidney stone suspected. EXAM: CT ABDOMEN AND PELVIS WITHOUT CONTRAST TECHNIQUE: Multidetector CT imaging of the abdomen and pelvis was performed following the standard protocol without IV contrast. RADIATION DOSE REDUCTION: This exam was performed according to the departmental dose-optimization program which includes automated exposure control, adjustment of the mA and/or kV according to patient size and/or use of iterative reconstruction technique. COMPARISON:  CT examination dated February 07, 2021 FINDINGS: Lower chest: No acute abnormality. Hepatobiliary: No focal liver abnormality is seen. No gallstones, gallbladder wall thickening, or biliary dilatation. Pancreas: Unremarkable. No pancreatic ductal dilatation or surrounding inflammatory changes. Spleen: Normal in size without focal abnormality. Adrenals/Urinary Tract: Adrenal glands are unremarkable. Kidneys are normal, without renal  calculi, focal lesion, or hydronephrosis. Bladder is unremarkable. Stomach/Bowel: Stomach is within normal limits. Appendix appears normal. No evidence of bowel wall thickening, distention, or inflammatory changes. Vascular/Lymphatic: Mild aortic atherosclerosis. No enlarged abdominal or pelvic lymph nodes. Reproductive: Prostate is unremarkable. Other: No abdominal wall hernia or abnormality. No abdominopelvic ascites. Musculoskeletal: Mild multilevel degenerate disc disease of the lumbar spine. IMPRESSION: 1. No evidence of nephrolithiasis or hydronephrosis. 2. Normal appendix.  No evidence of colitis or diverticulitis. 3. No CT evidence of acute abdominal/pelvic process. Electronically Signed   By: Keane Police D.O.   On: 09/22/2021 16:48    Procedures Procedures    Medications Ordered in ED Medications  lidocaine (LIDODERM) 5 % 1 patch (1 patch Transdermal Patch Applied 09/22/21 1857)    ED Course/ Medical Decision Making/ A&P  Medical Decision Making Amount and/or Complexity of Data Reviewed Labs: ordered. Radiology: ordered.   This patient presents to the ED for concern of right flank, abdominal pain, this involves an extensive number of treatment options, and is a complaint that carries with it a high risk of complications and morbidity.  The differential diagnosis includes but not limited to muscle strain, musculoskeletal source, cholelithiasis, peptic ulcer disease, appendicitis, kidney stone   Co morbidities that complicate the patient evaluation  As reviewed in HPI   Additional history obtained:  Additional history obtained from significant other at bedside who contributes to history as above External records from outside source obtained and reviewed including note from PCP visit today   Lab Tests:  I Ordered, and personally interpreted labs.  The pertinent results include: CBC with nonspecific leukocytosis, mild, 11.4.  Slight increase in  neutrophils.  CMP is unremarkable, specifically normal hepatic function.  Urinalysis is normal, lipase normal.   Imaging Studies ordered:  I ordered imaging studies including CT abdomen pelvis I independently visualized and interpreted imaging which showed no acute abnormality I agree with the radiologist interpretation   Problem List / ED Course / Critical interventions / Medication management  51 year old male with complaint of right flank pain which radiates to right lower abdomen, is worse with movement, palpation of back, sneezing/coughing.  Pain reproduced with palpation without midline or bony tenderness.  Labs are reassuring with nonspecific mild leukocytosis 11.4.  CT abdomen pelvis shows a normal appendix, no source for patient's pain is identified today.  Patient is provided with a Lidoderm patch while in the emergency room for his pain.  Discharged with prescription for Norflex as he felt Robaxin did not help him, also given Voltaren gel.  Recommend recheck with primary care provider.  Given referral to GI due to his concern for pain worse with eating even applesauce and right-sided abdominal bloating. I ordered medication including Lidoderm for back pain Reevaluation of the patient after these medicines showed that the patient improved I have reviewed the patients home medicines and have made adjustments as needed   Social Determinants of Health:  Has been CP, limited access due to self-pay   Test / Admission - Considered:  Consider right upper quadrant ultrasound however with CT imaging appearing normal and normal LFTs, lipase, bili, felt not necessary at this time but consider if patient returns         Final Clinical Impression(s) / ED Diagnoses Final diagnoses:  Right flank pain  Radicular pain    Rx / DC Orders ED Discharge Orders          Ordered    orphenadrine (NORFLEX) 100 MG tablet  2 times daily        09/22/21 1852    omeprazole (PRILOSEC) 20 MG  capsule  Daily        09/22/21 1852    diclofenac Sodium (VOLTAREN) 1 % GEL  4 times daily        09/22/21 1852              Tacy Learn, PA-C 09/22/21 2236    Tegeler, Gwenyth Allegra, MD 09/22/21 [phone]

## 2021-09-29 ENCOUNTER — Other Ambulatory Visit: Payer: Self-pay | Admitting: Family Medicine

## 2021-09-29 DIAGNOSIS — F411 Generalized anxiety disorder: Secondary | ICD-10-CM

## 2021-10-01 ENCOUNTER — Encounter: Payer: Self-pay | Admitting: Family

## 2021-10-01 ENCOUNTER — Inpatient Hospital Stay: Payer: Self-pay

## 2021-10-01 ENCOUNTER — Ambulatory Visit (HOSPITAL_COMMUNITY)
Admission: EM | Admit: 2021-10-01 | Discharge: 2021-10-01 | Disposition: A | Discharge status: Home / Self Care | Payer: No Payment, Other | Attending: Registered Nurse | Admitting: Registered Nurse

## 2021-10-01 ENCOUNTER — Inpatient Hospital Stay: Payer: Self-pay | Attending: Hematology & Oncology | Admitting: Family

## 2021-10-01 VITALS — BP 135/84 | HR 68 | Temp 98.1°F | Resp 17 | Ht 75.0 in | Wt 197.0 lb

## 2021-10-01 DIAGNOSIS — D751 Secondary polycythemia: Secondary | ICD-10-CM | POA: Insufficient documentation

## 2021-10-01 DIAGNOSIS — F332 Major depressive disorder, recurrent severe without psychotic features: Secondary | ICD-10-CM | POA: Diagnosis present

## 2021-10-01 DIAGNOSIS — D500 Iron deficiency anemia secondary to blood loss (chronic): Secondary | ICD-10-CM

## 2021-10-01 DIAGNOSIS — F419 Anxiety disorder, unspecified: Secondary | ICD-10-CM | POA: Diagnosis present

## 2021-10-01 DIAGNOSIS — G510 Bell's palsy: Secondary | ICD-10-CM | POA: Insufficient documentation

## 2021-10-01 DIAGNOSIS — F17200 Nicotine dependence, unspecified, uncomplicated: Secondary | ICD-10-CM | POA: Insufficient documentation

## 2021-10-01 DIAGNOSIS — R5383 Other fatigue: Secondary | ICD-10-CM | POA: Insufficient documentation

## 2021-10-01 LAB — CMP (CANCER CENTER ONLY)
ALT: 15 U/L (ref 0–44)
AST: 15 U/L (ref 15–41)
Albumin: 4.4 g/dL (ref 3.5–5.0)
Alkaline Phosphatase: 46 U/L (ref 38–126)
Anion gap: 6 (ref 5–15)
BUN: 10 mg/dL (ref 6–20)
CO2: 28 mmol/L (ref 22–32)
Calcium: 9.3 mg/dL (ref 8.9–10.3)
Chloride: 105 mmol/L (ref 98–111)
Creatinine: 0.77 mg/dL (ref 0.61–1.24)
GFR, Estimated: >60 mL/min (ref >60)
Glucose, Bld: 92 mg/dL (ref 70–99)
Potassium: 4.1 mmol/L (ref 3.5–5.1)
Sodium: 139 mmol/L (ref 135–145)
Total Bilirubin: 0.4 mg/dL (ref 0.3–1.2)
Total Protein: 7.1 g/dL (ref 6.5–8.1)

## 2021-10-01 LAB — CBC WITH DIFFERENTIAL (CANCER CENTER ONLY)
Abs Immature Granulocytes: 0.03 10*3/uL (ref 0.00–0.07)
Basophils Absolute: 0.1 10*3/uL (ref 0.0–0.1)
Basophils Relative: 1 %
Eosinophils Absolute: 0.1 10*3/uL (ref 0.0–0.5)
Eosinophils Relative: 1 %
HCT: 49.4 % (ref 39.0–52.0)
Hemoglobin: 16.6 g/dL (ref 13.0–17.0)
Immature Granulocytes: 0 %
Lymphocytes Relative: 19 %
Lymphs Abs: 1.9 10*3/uL (ref 0.7–4.0)
MCH: 30.7 pg (ref 26.0–34.0)
MCHC: 33.6 g/dL (ref 30.0–36.0)
MCV: 91.5 fL (ref 80.0–100.0)
Monocytes Absolute: 0.9 10*3/uL (ref 0.1–1.0)
Monocytes Relative: 9 %
Neutro Abs: 6.8 10*3/uL (ref 1.7–7.7)
Neutrophils Relative %: 70 %
Platelet Count: 252 10*3/uL (ref 150–400)
RBC: 5.4 MIL/uL (ref 4.22–5.81)
RDW: 13.2 % (ref 11.5–15.5)
WBC Count: 9.8 10*3/uL (ref 4.0–10.5)
nRBC: 0 % (ref 0.0–0.2)

## 2021-10-01 LAB — FERRITIN: Ferritin: 9 ng/mL — ABNORMAL LOW (ref 24–336)

## 2021-10-01 NOTE — Progress Notes (Signed)
Stuart Hill presents today for phlebotomy per MD orders. Phlebotomy procedure started at 1445 and ended at 1500. 560 cc removed. Patient tolerated procedure well. IV needle removed intact.

## 2021-10-01 NOTE — Discharge Instructions (Addendum)
  [facility]: Outpatient psychiatric Services  New Patient Assessment and Therapy Walk-in Monday thru Thursday 8:00 am first come first serve until slots are full Every Friday from 1:00 pm to 4:00 pm first come first serve until slots are full  New Patient Psychiatric Medication Management Monday thru Friday from 8:00 am to 11:00 am first come first served until slots are full  For all walk-ins we ask that you arrive by 7:15 am because patients will be seen in there order of arrival.   Availability is limited, and therefore you may not be seen on the same day that you walk in.  Our goal is to serve and meet the needs of our community to the best of our ability.    Outpatient services is located on the second floor.  After entering in the front get on elevator to second floor.  Registration on second floor.     Additional Resources for outpatient medication management/therapy:  [facility]  [id number], [place], [place] [address] Updated by this business 4 weeks ago Phone: [phone]  [facility]  [address] [place], [place],  [address] Phone: [phone]
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
## 2021-10-01 NOTE — Patient Instructions (Signed)

## 2021-10-01 NOTE — ED Notes (Signed)
Patient discharged by provider with written and verbal instructions.

## 2021-10-01 NOTE — Progress Notes (Signed)
   10/01/21 1625  [place] Triage Screening (Walk-ins at [facility] only)  What Is the Reason for Your Visit/Call Today? Patient presents with wife for assessment.  He reports he has been "through a lot" and proceeds to list multiple losses/traumas since childhood.  Most recently, he has lost 8 family members in the past 6 yrs.  He has supported his wife through breast cancer and mental health issues, to include suicide attempts.  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  He describes having an emotional box "full of negative stuff" that has no room for positive anymore.  He admits to passive SI, describing thoughts about what a "coma would be like," however he denies actual SI, stating he could never do that to wife or family.  Patient denies HI and AVH.  He does drink 4 times/week and he and wife drink 1.5 bottles between them.  Patient denies w/d sx between days he drinks.  He also feels he could go without alcohol.  How Long Has This Been Causing You Problems? > than 6 months  Have You Recently Had Any Thoughts About Hurting Yourself? Yes  How long ago did you have thoughts about hurting yourself? passive SI, no plan or intent  Are You Planning to [place] At This time? No  Have you Recently Had Thoughts About [place]? No  Are You Planning To Harm Someone At This Time? No  Are you currently experiencing any auditory, visual or other hallucinations? No  Have You Used Any Alcohol or Drugs in the Past 24 Hours? No  Do you have any current medical co-morbidities that require immediate attention? No  Clinician description of patient physical appearance/behavior: Anxious, cooperative and AAOx5  What Do You Feel Would Help You the Most Today? Treatment for Depression or other mood problem  If access to [facility] was not available, would you have sought care in the Emergency  Department? No  Determination of Need Routine (7 days)  Options For Referral Intensive Outpatient Therapy;Outpatient Therapy;Medication Management

## 2021-10-01 NOTE — Progress Notes (Signed)
Hematology and Oncology Follow Up Visit  Stuart Hill [id number] Dec 18, 1970 51 y.o. 10/01/2021   Principle Diagnosis:  Secondary polycythemia JAK2 (-), smoker  LEFT Bell's palsy   Current Therapy:        Phlebotomy to maintain hematocrit below 45% 2 baby aspirin daily at bedtime - not taking per patient   Interim History:  Stuart Hill is here today with his wife for follow-up and phlebotomy. He is feeling fatigued and sleeping for 10-12 hours a day. He states that he is feeling depressed and has an appointment on Sunday with a therapist that specializes in PTSD.  He also plans to stop at [facility] urgent care later today.  He denies feelings of harming himself or others.  He has headaches at times.  No fever, chills, n/v, cough, rash, dizziness, SOB, chest pain, palpitations, abdominal pain or changes in bowel or bladder habits.  No swelling, numbness or tingling in his extremities.  He has generalized aches and pains in his back and neck.  No falls or syncope. Appetite comes and goes. Hydration is good. Weight is stable at 197 lbs.   ECOG Performance Status: 1 - Symptomatic but completely ambulatory  Medications:  Allergies as of 10/01/2021       Reactions   Plavix [clopidogrel Bisulfate] Other (See Comments)   Confusion, lethargy, slurred speech   Afrin [nasal Spray] Hives   Antihistamines, Chlorpheniramine-type Hives   Benadryl [diphenhydramine Hcl] Hives   Buprenorphine Hcl Hives   Carisoprodol Hives   Codeine Hives   Metoclopramide Hives   Morphine And Related Hives   Oxymetazoline Hives   Pheniramine Hives, Rash        Medication List        Accurate as of October 01, 2021  2:25 PM. If you have any questions, ask your nurse or doctor.          albuterol 108 (90 Base) MCG/ACT inhaler Commonly known as: VENTOLIN HFA Inhale 1-2 puffs into the lungs every 6 (six) hours as needed for wheezing or shortness of breath.   ALPRAZolam 1 MG tablet Commonly  known as: XANAX TAKE 1 TABLET BY MOUTH THREE TIMES A DAY AS NEEDED FOR ANXIETY   aspirin EC 81 MG tablet Take 162 mg by mouth at bedtime. Swallow whole.   diclofenac Sodium 1 % Gel Commonly known as: VOLTAREN Apply 2 g topically 4 (four) times daily.   losartan 25 MG tablet Commonly known as: COZAAR Take 1 tablet (25 mg total) by mouth daily.   NEXIUM PO Take 1 tablet by mouth as needed (acid reflux).   nitroGLYCERIN 0.4 MG SL tablet Commonly known as: NITROSTAT Place 1 tablet (0.4 mg total) under the tongue every 5 (five) minutes as needed for chest pain.   omeprazole 20 MG capsule Commonly known as: PRILOSEC Take 1 capsule (20 mg total) by mouth daily.   orphenadrine 100 MG tablet Commonly known as: NORFLEX Take 1 tablet (100 mg total) by mouth 2 (two) times daily for 10 days.   rosuvastatin 10 MG tablet Commonly known as: CRESTOR TAKE 1 TABLET BY MOUTH ONCE DAILY AT BEDTIME   VISINE OP Apply 1 drop to eye as needed (itchy, dryness).        Allergies:  Allergies  Allergen Reactions   Plavix [Clopidogrel Bisulfate] Other (See Comments)    Confusion, lethargy, slurred speech   Afrin [Nasal Spray] Hives   Antihistamines, Chlorpheniramine-Type Hives   Benadryl [Diphenhydramine Hcl] Hives   Buprenorphine Hcl Hives  Carisoprodol Hives   Codeine Hives   Metoclopramide Hives   Morphine And Related Hives   Oxymetazoline Hives   Pheniramine Hives and Rash    Past Medical History, Surgical history, Social history, and Family History were reviewed and updated.  Review of Systems: All other 10 point review of systems is negative.   Physical Exam:  height is '6\' 3"'$  (1.905 m) and weight is 197 lb (89.4 kg). His oral temperature is 98.1 F (36.7 C). His blood pressure is 135/84 and his pulse is 68. His respiration is 17 and oxygen saturation is 99%.   Wt Readings from Last 3 Encounters:  10/01/21 197 lb (89.4 kg)  09/22/21 195 lb (88.5 kg)  09/10/21 195 lb 3.2  oz (88.5 kg)    Ocular: Sclerae unicteric, pupils equal, round and reactive to light Ear-nose-throat: Oropharynx clear, dentition fair Lymphatic: No cervical or supraclavicular adenopathy Lungs no rales or rhonchi, good excursion bilaterally Heart regular rate and rhythm, no murmur appreciated Abd soft, nontender, positive bowel sounds MSK no focal spinal tenderness, no joint edema Neuro: non-focal, well-oriented, appropriate affect Breasts: Deferred   Lab Results  Component Value Date   WBC 9.8 10/01/2021   HGB 16.6 10/01/2021   HCT 49.4 10/01/2021   MCV 91.5 10/01/2021   PLT 252 10/01/2021   Lab Results  Component Value Date   FERRITIN 18 (L) 08/22/2021   IRON 106 08/22/2021   TIBC 465 (H) 08/22/2021   UIBC 359 08/22/2021   IRONPCTSAT 23 08/22/2021   Lab Results  Component Value Date   RETICCTPCT 1.2 10/23/2020   RBC 5.40 10/01/2021   No results found for: "KPAFRELGTCHN", "LAMBDASER", "KAPLAMBRATIO" No results found for: "IGGSERUM", "IGA", "IGMSERUM" No results found for: "TOTALPROTELP", "ALBUMINELP", "A1GS", "A2GS", "BETS", "BETA2SER", "GAMS", "MSPIKE", "SPEI"   Chemistry      Component Value Date/Time   NA 139 10/01/2021 1340   NA 143 01/01/2017 1453   NA 139 11/12/2015 0945   K 4.1 10/01/2021 1340   K 4.3 01/01/2017 1453   K 3.7 11/12/2015 0945   CL 105 10/01/2021 1340   CL 103 01/01/2017 1453   CO2 28 10/01/2021 1340   CO2 27 01/01/2017 1453   CO2 22 11/12/2015 0945   BUN 10 10/01/2021 1340   BUN 18 01/01/2017 1453   BUN 13.1 11/12/2015 0945   CREATININE 0.77 10/01/2021 1340   CREATININE 0.96 10/05/2019 1042   CREATININE 0.9 11/12/2015 0945      Component Value Date/Time   CALCIUM 9.3 10/01/2021 1340   CALCIUM 8.8 01/01/2017 1453   CALCIUM 8.9 11/12/2015 0945   ALKPHOS 46 10/01/2021 1340   ALKPHOS 44 01/01/2017 1453   ALKPHOS 66 11/12/2015 0945   AST 15 10/01/2021 1340   AST 13 11/12/2015 0945   ALT 15 10/01/2021 1340   ALT 368 (HH) 01/01/2017  1453   ALT 14 11/12/2015 0945   BILITOT 0.4 10/01/2021 1340   BILITOT <0.22 11/12/2015 0945       Impression and Plan: Stuart Hill is a 51 yo caucasian male with history of secondary polycythemia, JAK2 negative, smoker.  We will proceed with phlebotomy today for Hct 49.4%.  Lab check and phlebotomy every 8 weeks. Follow-up in 6 months.   Lottie Dawson, NP 9/6/20232:25 PM

## 2021-10-02 LAB — IRON AND IRON BINDING CAPACITY (CC-WL,HP ONLY)
Iron: 45 ug/dL (ref 45–182)
Saturation Ratios: 9 % — ABNORMAL LOW (ref 17.9–39.5)
TIBC: 525 ug/dL — ABNORMAL HIGH (ref 250–450)
UIBC: 480 ug/dL — ABNORMAL HIGH (ref 117–376)

## 2021-10-03 ENCOUNTER — Ambulatory Visit (HOSPITAL_COMMUNITY)
Admission: EM | Admit: 2021-10-03 | Discharge: 2021-10-03 | Disposition: A | Discharge status: Home / Self Care | Payer: No Payment, Other | Attending: Urology | Admitting: Urology

## 2021-10-03 DIAGNOSIS — R410 Disorientation, unspecified: Secondary | ICD-10-CM | POA: Insufficient documentation

## 2021-10-03 DIAGNOSIS — F332 Major depressive disorder, recurrent severe without psychotic features: Secondary | ICD-10-CM | POA: Insufficient documentation

## 2021-10-03 NOTE — ED Notes (Signed)
AVS reviewed with pt who verbalized understanding.

## 2021-10-03 NOTE — ED Triage Notes (Signed)
Patient presents to [facility] accompanied by his wife for an assessment. He reports he has been "through a lot" and proceeds to list multiple losses/traumas since childhood. Most recently, he has lost 8 family members in the past 6 yrs. He has supported his wife through breast cancer and mental health issues, to include suicide attempts and more recently a few hip replacements. 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  He admits to passive SI, describing thoughts about what a "coma would be like," however he denies actual SI, stating he could never do that to wife or family. Pt has tangential speech, reporting that it may be due to him drinking 6 cups of coffee today. Pt reports PTSD diagnosis, and nightmares, Pt reports being prescribed Xanax. Pt reports anxiety symptoms and is fearful when leaving his bedroom for extended periods of time. Patient denies HI and AVH.

## 2021-10-03 NOTE — Discharge Instructions (Addendum)
  Discharge recommendations:  Patient is to take medications as prescribed. Please see information for follow-up appointment with psychiatry and therapy. Please follow up with your primary care provider for all medical related needs.   You are encouraged to follow up with [facility] for outpatient treatment.  Walk in/ Open Access Hours: Monday - Friday 8AM - 11AM (To see provider and therapist) - Arrive around 7 or 7:15 to have a better chance of being seen, as slots fill up.   Friday - 1PM - 4PM (To see therapist only)  [facility] [address] [place], [place]  Therapy: We recommend that patient participate in individual therapy to address mental health concerns.  Medications: The parent/guardian is to contact a medical professional and/or outpatient provider to address any new side effects that develop. Parent/guardian should update outpatient providers of any new medications and/or medication changes.   Atypical antipsychotics: If you are prescribed an atypical antipsychotic, it is recommended that your height, weight, BMI, blood pressure, fasting lipid panel, and fasting blood sugar be monitored by your outpatient providers.  Safety:  The patient should abstain from use of illicit substances/drugs and abuse of any medications. If symptoms worsen or do not continue to improve or if the patient becomes actively suicidal or homicidal then it is recommended that the patient return to the closest hospital emergency department, the [facility], or call 911 for further evaluation and treatment. National Suicide Prevention Lifeline 1-800-SUICIDE or (619)048-1350.  About 988 988 offers 24/7 access to trained crisis counselors who can help people experiencing mental health-related distress. People can call or text 988 or chat 988lifeline.org for themselves or if they are worried about a loved one who may need  crisis support.  Crisis Mobile: [facility]:                     [phone] (for crisis response 24 hours a day) [place]:                                            [phone]

## 2021-10-03 NOTE — ED Provider Notes (Incomplete)
Behavioral Health Urgent Care Medical Screening Exam  Patient Name: Stuart Hill MRN: [id number] Date of Evaluation: 10/03/21 Chief Complaint:   Diagnosis:  Final diagnoses:  Severe episode of recurrent major depressive disorder, without psychotic features ([place])    History of Present illness: Stuart Hill is a 51 y.o. male. ***  Psychiatric Specialty Exam  Presentation  General Appearance:Appropriate for Environment  Eye Contact:Good  Speech:Clear and Coherent  Speech Volume:Normal  Handedness:Right   Mood and Affect  Mood:Depressed  Affect:Congruent   Thought Process  Thought Processes:Coherent  Descriptions of Associations:Intact  Orientation:Full (Time, Place and Person)  Thought Content:WDL    Hallucinations:None  Ideas of Reference:None  Suicidal Thoughts:No  Homicidal Thoughts:No   Sensorium  Memory:Immediate Good; Recent Good; Remote Good  Judgment:Good  Insight:Good   Executive Functions  Concentration:Good  Attention Span:Good  [place]  Language:Good   Psychomotor Activity  Psychomotor Activity:Normal   Assets  Assets:Communication Skills; Desire for Improvement; Housing; Physical Health; Social Support; Transportation   Sleep  Sleep:Good  Number of hours: 12   No data recorded  Physical Exam: Physical Exam ROS Blood pressure (!) 150/96, pulse 82, temperature 97.8 F (36.6 C), temperature source Oral, resp. rate 18, SpO2 100 %. There is no height or weight on file to calculate BMI.  Musculoskeletal: Strength & Muscle Tone: {desc; muscle tone:32375} Gait & Station: {PE GAIT ED NATL:22525} Patient leans: {Patient Leans:[id number]}   [place] MSE Discharge Disposition for Follow up and Recommendations: {[facility] MSE Recommendations:24277}   Ophelia Shoulder, NP 10/03/2021, 9:18 PM

## 2021-10-07 NOTE — Patient Instructions (Signed)
Make every effort to maintain a "heart-healthy" lifestyle with regular physical exercise and adherence to a low-fat, low-carbohydrate diet.  Continue to seek regular follow-up appointments with your primary care physician and/or cardiologist.   Stop smoking immediately and permanently.

## 2021-10-07 NOTE — Progress Notes (Signed)
[address]       [place] [address]             [phone]        Antwain J Dave [id number] 1970/08/08   History of Present Illness:  Stuart Hill is a 51 yo male with history of COPD, Anxiety, HLD, Polycythemia Vera, Bell's Palsy.  He was evaluated by Dr. Harriet Masson in August at which time he complained of fatigue, back pain, and shortness of breath while working.  He has a lot of stress and anxiety at home with caring for his wife who is requiring multiple surgeries.  At that visit he requested disability which is not able to be provided from a heart standpoint.  He also requested referral to Cardiothoracic surgery for his "aortic aneurysm."  This was identified as a possibility on Echocardiogram and subsequent CTA of the chest showed a normal ascending aorta.  He has presented to the ED in August with complaints of right flank pain which was felt to be musculoskeletal in nature.  He again presented for evaluation on 9/8 for complaints anxiety and depression at the behavioral health clinic.  Currently patient is very upset about his current situation.  He states that he has been told he can do nothing.  No lifting, pushing, pulling over 10-15 lbs.  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  However with recent diagnosis and personal issues he has had to substantially decrease his business and employees.  He recently suffered a great deal of depression and didn't leave his bed for several months.  He continues to smoke, but states he is no longer drinking.  He continues to have some episodic shortness of breath and chest pain.  He has a family history of premature CAD with his brother and father passing away at a young age.  He states his mother has an abdominal aortic aneurysm.   Current Outpatient Medications on File Prior to Visit  Medication Sig Dispense Refill   albuterol (VENTOLIN HFA) 108 (90 Base) MCG/ACT inhaler Inhale  1-2 puffs into the lungs every 6 (six) hours as needed for wheezing or shortness of breath. 18 g 3   ALPRAZolam (XANAX) 1 MG tablet TAKE 1 TABLET BY MOUTH THREE TIMES A DAY AS NEEDED FOR ANXIETY 90 tablet 2   aspirin EC 81 MG tablet Take 162 mg by mouth at bedtime. Swallow whole.     diclofenac Sodium (VOLTAREN) 1 % GEL Apply 2 g topically 4 (four) times daily. 100 g 0   Esomeprazole Magnesium (NEXIUM PO) Take 1 tablet by mouth as needed (acid reflux).     losartan (COZAAR) 25 MG tablet Take 1 tablet (25 mg total) by mouth daily. 90 tablet 3   nitroGLYCERIN (NITROSTAT) 0.4 MG SL tablet Place 1 tablet (0.4 mg total) under the tongue every 5 (five) minutes as needed for chest pain. 25 tablet 6   omeprazole (PRILOSEC) 20 MG capsule Take 1 capsule (20 mg total) by mouth daily. 30 capsule 0   rosuvastatin (CRESTOR) 10 MG tablet TAKE 1 TABLET BY MOUTH ONCE DAILY AT BEDTIME 90 tablet 1   Tetrahydrozoline HCl (VISINE OP) Apply 1 drop to eye as needed (itchy, dryness).     No current facility-administered medications on file prior to visit.     BP 124/81   Pulse 68   Resp 20   Ht '6\' 3"'$  (1.905 m)   Wt  197 lb (89.4 kg)   SpO2 100% Comment: RA  BMI 24.62 kg/m   Physical Exam  Gen: Mild Agitation, NAD Heart: RRR Lungs: CTA bilaterally Abd: soft non-tender non-distended Ext; no edema Neuro: grossly intact  CTA Results: (08/08/2021)  CTA CHEST FINDINGS   Cardiovascular: Heart size is normal. The aortic arch thoracic aorta are within limits. Great vessel origins are within normal limits. The descending thoracic aorta is unremarkable.   Pulmonary artery opacification is excellent. No filling defects are present. Pulmonary artery size is normal.   Mediastinum/Nodes: Subcentimeter paratracheal lymph nodes are present. No significant adenopathy present. Soft Gus is within normal limits. The thoracic inlet is normal.   Lungs/Pleura: Minimal paraseptal emphysematous change again  noted. Lungs are clear. No nodule, mass, or airspace disease present. No significant pleural disease is present.   Musculoskeletal: No chest wall abnormality. No acute or significant osseous findings.   Review of the MIP images confirms the above findings.   CTA ABDOMEN AND PELVIS FINDINGS   VASCULAR   Aorta: Minimal atherosclerotic changes are present in the distal aorta just above the bifurcation. No significant aneurysm or change.   Celiac: Patent without evidence of aneurysm, dissection, vasculitis or significant stenosis.   SMA: Patent without evidence of aneurysm, dissection, vasculitis or significant stenosis.   Renals: Both renal arteries are patent without evidence of aneurysm, dissection, vasculitis, fibromuscular dysplasia or significant stenosis.   IMA: Patent without evidence of aneurysm, dissection, vasculitis or significant stenosis.   Inflow: Atherosclerotic changes extend into the proximal iliac arteries. No aneurysm or stenosis is present. No significant interval change. Branch vessels are within normal limits.   Veins: No obvious venous abnormality within the limitations of this arterial phase study.   Review of the MIP images confirms the above findings.   NON-VASCULAR   Hepatobiliary: No focal liver abnormality is seen. No gallstones, gallbladder wall thickening, or biliary dilatation.   Pancreas: Unremarkable. No pancreatic ductal dilatation or surrounding inflammatory changes.   Spleen: Normal in size without focal abnormality.   Adrenals/Urinary Tract: Adrenal glands are unremarkable. Kidneys are normal, without renal calculi, focal lesion, or hydronephrosis. Bladder is unremarkable.   Stomach/Bowel: Stomach is within normal limits. Appendix appears normal. No evidence of bowel wall thickening, distention, or inflammatory changes.   Lymphatic: No significant abdominal or pelvic adenopathy is present.   Reproductive: Prostate is  unremarkable.   Other: No abdominal wall hernia or abnormality. No abdominopelvic ascites.   Musculoskeletal: No acute or significant osseous findings.   Review of the MIP images confirms the above findings.   IMPRESSION: 1. No evidence for aortic dissection other acute vascular findings the chest, abdomen or pelvis 2. Stable minimal aortic atherosclerosis. Aortic Atherosclerosis (ICD10-I70.0). 3. Stable minimal paraseptal emphysematous change.   .     Electronically Signed   By: San Morelle M.D.   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  Echocardiogram obtained in January of this year measures the patient's Aortic Root at 4.1 cm.  The patient has also had CTA with dissection protocol in January and July of this year both of which do not mention evidence of aortic dissection, root enlargement or aneurysm.   The patient and his spouse are very upset about being told various things from various providers.  The patient is also frustrated that he was told he couldn't do anything and now his  business is basically collapsing.    I spoke with the patient and his spouse at length about Aortic Aneurysm.  He was given instruction in regards to his activity level. He was encouraged to stop smoking.  His blood pressure is well controlled and he was instructed to not stop his Losartan. Per his CTA there is no evidence of aneurysm which the patient had difficulty with being told conflicting diagnosis.    I think the best course of action is to have the patient return to our office on Monday 9/18 and be evaluated by Dr. Lavonna Monarch.  He can review the patient's images further and decide if further surveillance is warranted.   Risk Modification:  Statin:  Yes  Smoking cessation instruction/counseling given:  counseled patient on the dangers of tobacco use, advised patient to stop smoking,  and reviewed strategies to maximize success  Patient was counseled on importance of Blood Pressure Control.  Despite Medical intervention if the patient notices persistently elevated blood pressure readings.  They are instructed to contact their Primary Care Physician  Please avoid use of Fluoroquinolones as this can potentially increase your risk of Aortic Rupture and/or Dissection  Patient educated on signs and symptoms of Aortic Dissection, handout also provided in AVS  Christinna Sprung, PA-C 10/09/21

## 2021-10-09 ENCOUNTER — Institutional Professional Consult (permissible substitution) (INDEPENDENT_AMBULATORY_CARE_PROVIDER_SITE_OTHER): Payer: Self-pay | Admitting: Physician Assistant

## 2021-10-09 VITALS — BP 124/81 | HR 68 | Resp 20 | Ht 75.0 in | Wt 197.0 lb

## 2021-10-09 DIAGNOSIS — I77810 Thoracic aortic ectasia: Secondary | ICD-10-CM

## 2021-10-10 ENCOUNTER — Institutional Professional Consult (permissible substitution): Payer: Self-pay | Admitting: Pulmonary Disease

## 2021-10-13 ENCOUNTER — Ambulatory Visit (INDEPENDENT_AMBULATORY_CARE_PROVIDER_SITE_OTHER): Payer: Self-pay | Admitting: Thoracic Surgery (Cardiothoracic Vascular Surgery)

## 2021-10-13 ENCOUNTER — Encounter: Payer: Self-pay | Admitting: Thoracic Surgery (Cardiothoracic Vascular Surgery)

## 2021-10-13 VITALS — BP 122/82 | HR 70 | Resp 18 | Wt 194.0 lb

## 2021-10-13 DIAGNOSIS — I77810 Thoracic aortic ectasia: Secondary | ICD-10-CM

## 2021-10-13 NOTE — Patient Instructions (Signed)
Follow up as needed Discuss heart healthy diet and HTN contol with pcp No smoking or drug use

## 2021-10-13 NOTE — Progress Notes (Unsigned)
[address]       [place] [address]             [place] Record #[id number] Date of Birth: Dec 27, 1970  Berniece Salines, DO Copland, Gay Filler, MD  Chief Complaint:   No chief complaint on file.   History of Present Illness:      Very pleasant 51 yo wm who has very strong family history of early heart disease who has had several episodes over the years of chest pain. Pt has had several work ups with a Coronary CTA in 2022 with a noted 35m aortic root measurement. He had echo in 01/2021 with normal heart measurements and a root measurement of 436m He has due to his understanding of the restrictions of being told he had an aneurysm that he has been very anxious and has made lifestyle changes with exercising and work related projects that were very stressful for him. He has had a repeat CTA this July and was reported as all cardiovascular measurements as normal and no aneurysm. Pt became quite confused and frustrated at the differences in study results and recommendations. He was seen by our PA last Friday and was asked to get my input on his situation. Pt has no exertional CP or SOB. He has had a brother die suddenly but without known cause at this time.      Past Medical History:  Diagnosis Date   Allergy    Anxiety    Arthritis    Bell's palsy    COPD (chronic obstructive pulmonary disease) ([facility]   Erythrocytosis 04/10/2015   Hyperlipidemia    Polycy[place]mia vera ([facility]/07[place]2015    Past Surgical History:  Procedure Laterality Date   DENTAL SURGERY     HAND SURGERY     TONSILLECTOMY Bilateral 04/30/2017   Procedure: TONSILLECTOMY;  Surgeon: ShJerrell BelfastMD;  [facility];  Laterality: Bilateral;    Social History   Tobacco Use  Smoking Status Every Day   Packs/day: 0.25   Years: 24.00   Total pack years: 6.00   Types: Cigarettes  Smokeless Tobacco Never     Social History   Substance and Sexual Activity  Alcohol Use Yes   Alcohol/week: 1.0 standard drink of alcohol   Types: 1 Cans of beer per week   Comment: 6-10 beer a week    Social History   Socioeconomic History   Marital status: Married    Spouse name: Not on file   Number of children: Not on file   Years of education: Not on file   Highest education level: Not on file  Occupational History   Not on file  Tobacco Use   Smoking status: Every Day    Packs/day: 0.25    Years: 24.00    Total pack years: 6.00    Types: Cigarettes   Smokeless tobacco: Never  Vaping Use   Vaping Use: Never used  Substance and Sexual Activity   Alcohol use: Yes    Alcohol/week: 1.0 standard drink of alcohol    Types: 1 Cans of beer per week    Comment: 6-10 beer a week   Drug use: Yes    Types: Other-see comments, Marijuana    Comment: Marijuana every other day.   Sexual activity: Yes  Other Topics Concern   Not on file  Social History  Narrative   Not on file   Social Determinants of Health   Financial Resource Strain: Medium Risk (04/29/2021)   Overall Financial Resource Strain (CARDIA)    Difficulty of Paying Living Expenses: Somewhat hard  Food Insecurity: No Food Insecurity (04/29/2021)   Hunger Vital Sign    Worried About Running Out of Food in the Last Year: Never true    Ran Out of Food in the Last Year: Never true  Transportation Needs: No Transportation Needs (04/29/2021)   PRAPARE - Hydrologist (Medical): No    Lack of Transportation (Non-Medical): No  Physical Activity: Not on file  Stress: Not on file  Social Connections: Not on file  Intimate Partner Violence: Not on file    Allergies  Allergen Reactions   Plavix [Clopidogrel Bisulfate] Other (See Comments)    Confusion, lethargy, slurred speech   Afrin [Nasal Spray] Hives   Antihistamines, Chlorpheniramine-Type Hives   Benadryl [Diphenhydramine Hcl] Hives   Buprenorphine Hcl Hives    Carisoprodol Hives   Codeine Hives   Metoclopramide Hives   Morphine And Related Hives   Oxymetazoline Hives   Pheniramine Hives and Rash    Current Outpatient Medications  Medication Sig Dispense Refill   albuterol (VENTOLIN HFA) 108 (90 Base) MCG/ACT inhaler Inhale 1-2 puffs into the lungs every 6 (six) hours as needed for wheezing or shortness of breath. 18 g 3   ALPRAZolam (XANAX) 1 MG tablet TAKE 1 TABLET BY MOUTH THREE TIMES A DAY AS NEEDED FOR ANXIETY 90 tablet 2   aspirin EC 81 MG tablet Take 162 mg by mouth at bedtime. Swallow whole.     diclofenac Sodium (VOLTAREN) 1 % GEL Apply 2 g topically 4 (four) times daily. 100 g 0   Esomeprazole Magnesium (NEXIUM PO) Take 1 tablet by mouth as needed (acid reflux).     losartan (COZAAR) 25 MG tablet Take 1 tablet (25 mg total) by mouth daily. 90 tablet 3   nitroGLYCERIN (NITROSTAT) 0.4 MG SL tablet Place 1 tablet (0.4 mg total) under the tongue every 5 (five) minutes as needed for chest pain. 25 tablet 6   omeprazole (PRILOSEC) 20 MG capsule Take 1 capsule (20 mg total) by mouth daily. 30 capsule 0   rosuvastatin (CRESTOR) 10 MG tablet TAKE 1 TABLET BY MOUTH ONCE DAILY AT BEDTIME 90 tablet 1   Tetrahydrozoline HCl (VISINE OP) Apply 1 drop to eye as needed (itchy, dryness).     No current facility-administered medications for this visit.     Family History  Problem Relation Age of Onset   COPD Mother    Fibromyalgia Mother    Heart disease Father    Stroke Father    Depression Brother    Heart disease Brother    Heart disease Maternal Grandmother    Stroke Maternal Grandmother    Heart disease Maternal Grandfather    Stroke Maternal Grandfather    Heart disease Paternal Grandmother    Stroke Paternal Grandmother    Heart disease Paternal Grandfather    Stroke Paternal Grandfather    Stroke Paternal Aunt        Physical Exam: There were no vitals taken for this visit.  EXAM Lungs: clear Cardiac: rr without  murmurs   Diagnostic Studies & Laboratory data: I have personally reviewed the following studies and concur with their findings   TTE (01/2021) IMPRESSIONS   1. Left ventricular ejection fraction, by estimation, is 60 to 65%. The  left  ventricle has normal function. The left ventricle has no regional  wall motion abnormalities. Left ventricular diastolic parameters were  normal.   2. Right ventricular systolic function is normal. The right ventricular  size is normal.   3. The mitral valve is normal in structure. No evidence of mitral valve  regurgitation.   4. The aortic valve is normal in structure. Aortic valve regurgitation is  not visualized.   5. 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   6. The inferior vena cava is normal in size with greater than 50%  respiratory variability, suggesting right atrial pressure of 3 mmHg.   Comparison(s): No significant change from prior study.   CTA CHEST FINDINGS (07/2021)   Cardiovascular: Heart size is normal. The aortic arch thoracic aorta are within limits. Great vessel origins are within normal limits. The descending thoracic aorta is unremarkable.   Pulmonary artery opacification is excellent. No filling defects are present. Pulmonary artery size is normal.   Mediastinum/Nodes: Subcentimeter paratracheal lymph nodes are present. No significant adenopathy present. Soft Gus is within normal limits. The thoracic inlet is normal.   Lungs/Pleura: Minimal paraseptal emphysematous change again noted. Lungs are clear. No nodule, mass, or airspace disease present. No significant pleural disease is present.   Musculoskeletal: No chest wall abnormality. No acute or significant osseous findings.   Review of the MIP images confirms the above findings.   CTA ABDOMEN AND PELVIS FINDINGS   VASCULAR   Aorta: Minimal atherosclerotic changes are present in the distal aorta just above the bifurcation. No significant aneurysm or change.    Celiac: Patent without evidence of aneurysm, dissection, vasculitis or significant stenosis.   SMA: Patent without evidence of aneurysm, dissection, vasculitis or significant stenosis.   Renals: Both renal arteries are patent without evidence of aneurysm, dissection, vasculitis, fibromuscular dysplasia or significant stenosis.   IMA: Patent without evidence of aneurysm, dissection, vasculitis or significant stenosis.   Inflow: Atherosclerotic changes extend into the proximal iliac arteries. No aneurysm or stenosis is present. No significant interval change. Branch vessels are within normal limits.   Veins: No obvious venous abnormality within the limitations of this arterial phase study.   Review of the MIP images confirms the above findings.   NON-VASCULAR   Hepatobiliary: No focal liver abnormality is seen. No gallstones, gallbladder wall thickening, or biliary dilatation.   Pancreas: Unremarkable. No pancreatic ductal dilatation or surrounding inflammatory changes.   Spleen: Normal in size without focal abnormality.   Adrenals/Urinary Tract: Adrenal glands are unremarkable. Kidneys are normal, without renal calculi, focal lesion, or hydronephrosis. Bladder is unremarkable.   Stomach/Bowel: Stomach is within normal limits. Appendix appears normal. No evidence of bowel wall thickening, distention, or inflammatory changes.   Lymphatic: No significant abdominal or pelvic adenopathy is present.   Reproductive: Prostate is unremarkable.   Other: No abdominal wall hernia or abnormality. No abdominopelvic ascites.   Musculoskeletal: No acute or significant osseous findings.   Review of the MIP images confirms the above findings.   IMPRESSION: 1. No evidence for aortic dissection other acute vascular findings the chest, abdomen or pelvis 2. Stable minimal aortic atherosclerosis. Aortic Atherosclerosis (ICD10-I70.0). 3. Stable minimal paraseptal emphysematous  change.   .     Recent Radiology Findings:   No results found.    Recent Lab Findings: Lab Results  Component Value Date   WBC 9.8 10/01/2021   HGB 16.6 10/01/2021   HCT 49.4 10/01/2021   PLT 252 10/01/2021   GLUCOSE 92 10/01/2021  CHOL 205 (H) 07/26/2020   TRIG 178.0 (H) 07/26/2020   HDL 75.80 07/26/2020   LDLCALC 94 07/26/2020   ALT 15 10/01/2021   AST 15 10/01/2021   NA 139 10/01/2021   K 4.1 10/01/2021   CL 105 10/01/2021   CREATININE 0.77 10/01/2021   BUN 10 10/01/2021   CO2 28 10/01/2021   TSH 1.980 04/21/2021   INR 0.98 09/01/2015      Assessment / Plan:    Aortic Extasia Pt has no clear measurements for aneurysm and the root measurements taken even by me with pt and wife in room of his latest CTA cannot get over 4.1 cm in the root. We had a long discussion of the natural history of aneurysms and the indications for elective surgical intervention. With the anatomical norm of the sinuses being relatively larger than the ascending aorta the exact measurement of these are somewhat dependent on sample size and technique. In his situation where the restrictions placed on his life activities has dramatically impacted him, I feel that he at the level of measurements I am seeing, we should insist in him the same we do for actual documented aneurysms. He understands that he should not lift a weight that would make him exert large amount of pressure but that he can excersize and work at levels that is under this. I feel that no further work up follow up is needed and that only for symptoms be carried out in the future. I would be glad to have him return in a year if that would make him less stressed over his situation and then perhaps just an echo would be a reasonable screen at this point moving forward. He of course should have a cardiac healthy diet, HTN control and no smoking or drug use that would impact him since his strong family history of heart disease. I have spent 65 min  in chart review, personally reviewing measurments on the current studies and in face to face with the patient and wife and in coordination of care.        '@me1'$ @ 10/13/2021 8:58 AM

## 2021-12-01 ENCOUNTER — Inpatient Hospital Stay: Payer: Self-pay

## 2021-12-01 ENCOUNTER — Inpatient Hospital Stay: Payer: Self-pay | Attending: Hematology & Oncology

## 2021-12-01 DIAGNOSIS — D751 Secondary polycythemia: Secondary | ICD-10-CM | POA: Insufficient documentation

## 2021-12-01 DIAGNOSIS — F17210 Nicotine dependence, cigarettes, uncomplicated: Secondary | ICD-10-CM | POA: Insufficient documentation

## 2021-12-02 ENCOUNTER — Inpatient Hospital Stay: Payer: Self-pay

## 2021-12-02 DIAGNOSIS — D500 Iron deficiency anemia secondary to blood loss (chronic): Secondary | ICD-10-CM

## 2021-12-02 DIAGNOSIS — D751 Secondary polycythemia: Secondary | ICD-10-CM

## 2021-12-02 LAB — CBC WITH DIFFERENTIAL (CANCER CENTER ONLY)
Abs Immature Granulocytes: 0.02 10*3/uL (ref 0.00–0.07)
Basophils Absolute: 0.1 10*3/uL (ref 0.0–0.1)
Basophils Relative: 1 %
Eosinophils Absolute: 0 10*3/uL (ref 0.0–0.5)
Eosinophils Relative: 0 %
HCT: 49.1 % (ref 39.0–52.0)
Hemoglobin: 16.5 g/dL (ref 13.0–17.0)
Immature Granulocytes: 0 %
Lymphocytes Relative: 15 %
Lymphs Abs: 1.6 10*3/uL (ref 0.7–4.0)
MCH: 29.9 pg (ref 26.0–34.0)
MCHC: 33.6 g/dL (ref 30.0–36.0)
MCV: 88.9 fL (ref 80.0–100.0)
Monocytes Absolute: 0.8 10*3/uL (ref 0.1–1.0)
Monocytes Relative: 8 %
Neutro Abs: 7.9 10*3/uL — ABNORMAL HIGH (ref 1.7–7.7)
Neutrophils Relative %: 76 %
Platelet Count: 335 10*3/uL (ref 150–400)
RBC: 5.52 MIL/uL (ref 4.22–5.81)
RDW: 13.3 % (ref 11.5–15.5)
WBC Count: 10.4 10*3/uL (ref 4.0–10.5)
nRBC: 0 % (ref 0.0–0.2)

## 2021-12-02 LAB — CMP (CANCER CENTER ONLY)
ALT: 15 U/L (ref 0–44)
AST: 15 U/L (ref 15–41)
Albumin: 4.5 g/dL (ref 3.5–5.0)
Alkaline Phosphatase: 51 U/L (ref 38–126)
Anion gap: 8 (ref 5–15)
BUN: 11 mg/dL (ref 6–20)
CO2: 25 mmol/L (ref 22–32)
Calcium: 9.1 mg/dL (ref 8.9–10.3)
Chloride: 104 mmol/L (ref 98–111)
Creatinine: 0.75 mg/dL (ref 0.61–1.24)
GFR, Estimated: >60 mL/min (ref >60)
Glucose, Bld: 102 mg/dL — ABNORMAL HIGH (ref 70–99)
Potassium: 4.1 mmol/L (ref 3.5–5.1)
Sodium: 137 mmol/L (ref 135–145)
Total Bilirubin: 0.5 mg/dL (ref 0.3–1.2)
Total Protein: 7.4 g/dL (ref 6.5–8.1)

## 2021-12-02 LAB — IRON AND IRON BINDING CAPACITY (CC-WL,HP ONLY)
Iron: 83 ug/dL (ref 45–182)
Saturation Ratios: 18 % (ref 17.9–39.5)
TIBC: 472 ug/dL — ABNORMAL HIGH (ref 250–450)
UIBC: 389 ug/dL — ABNORMAL HIGH (ref 117–376)

## 2021-12-02 LAB — FERRITIN: Ferritin: 14 ng/mL — ABNORMAL LOW (ref 24–336)

## 2021-12-03 ENCOUNTER — Ambulatory Visit: Payer: Self-pay | Admitting: Medical

## 2021-12-29 ENCOUNTER — Encounter: Payer: Self-pay | Admitting: Family Medicine

## 2021-12-29 ENCOUNTER — Other Ambulatory Visit: Payer: Self-pay | Admitting: Family Medicine

## 2021-12-29 DIAGNOSIS — F411 Generalized anxiety disorder: Secondary | ICD-10-CM

## 2021-12-29 MED ORDER — ALPRAZOLAM 1 MG PO TABS: ORAL_TABLET | ORAL | 2 refills | Status: DC

## 2021-12-29 NOTE — Telephone Encounter (Signed)
Okay for refill?  

## 2022-01-30 ENCOUNTER — Inpatient Hospital Stay: Payer: Self-pay

## 2022-01-30 ENCOUNTER — Inpatient Hospital Stay: Payer: Self-pay | Attending: Hematology & Oncology

## 2022-01-30 DIAGNOSIS — D500 Iron deficiency anemia secondary to blood loss (chronic): Secondary | ICD-10-CM

## 2022-01-30 DIAGNOSIS — D751 Secondary polycythemia: Secondary | ICD-10-CM | POA: Insufficient documentation

## 2022-01-30 LAB — CBC WITH DIFFERENTIAL (CANCER CENTER ONLY)
Abs Immature Granulocytes: 0.04 10*3/uL (ref 0.00–0.07)
Basophils Absolute: 0.1 10*3/uL (ref 0.0–0.1)
Basophils Relative: 1 %
Eosinophils Absolute: 0.1 10*3/uL (ref 0.0–0.5)
Eosinophils Relative: 1 %
HCT: 53.3 % — ABNORMAL HIGH (ref 39.0–52.0)
Hemoglobin: 17.5 g/dL — ABNORMAL HIGH (ref 13.0–17.0)
Immature Granulocytes: 0 %
Lymphocytes Relative: 13 %
Lymphs Abs: 1.3 10*3/uL (ref 0.7–4.0)
MCH: 30 pg (ref 26.0–34.0)
MCHC: 32.8 g/dL (ref 30.0–36.0)
MCV: 91.4 fL (ref 80.0–100.0)
Monocytes Absolute: 0.7 10*3/uL (ref 0.1–1.0)
Monocytes Relative: 7 %
Neutro Abs: 7.4 10*3/uL (ref 1.7–7.7)
Neutrophils Relative %: 78 %
Platelet Count: 229 10*3/uL (ref 150–400)
RBC: 5.83 MIL/uL — ABNORMAL HIGH (ref 4.22–5.81)
RDW: 14.6 % (ref 11.5–15.5)
WBC Count: 9.5 10*3/uL (ref 4.0–10.5)
nRBC: 0 % (ref 0.0–0.2)

## 2022-01-30 LAB — CMP (CANCER CENTER ONLY)
ALT: 13 U/L (ref 0–44)
AST: 16 U/L (ref 15–41)
Albumin: 4.5 g/dL (ref 3.5–5.0)
Alkaline Phosphatase: 53 U/L (ref 38–126)
Anion gap: 8 (ref 5–15)
BUN: 11 mg/dL (ref 6–20)
CO2: 28 mmol/L (ref 22–32)
Calcium: 9 mg/dL (ref 8.9–10.3)
Chloride: 103 mmol/L (ref 98–111)
Creatinine: 0.9 mg/dL (ref 0.61–1.24)
GFR, Estimated: >60 mL/min (ref >60)
Glucose, Bld: 103 mg/dL — ABNORMAL HIGH (ref 70–99)
Potassium: 4.3 mmol/L (ref 3.5–5.1)
Sodium: 139 mmol/L (ref 135–145)
Total Bilirubin: 0.5 mg/dL (ref 0.3–1.2)
Total Protein: 7.5 g/dL (ref 6.5–8.1)

## 2022-01-30 LAB — FERRITIN: Ferritin: 22 ng/mL — ABNORMAL LOW (ref 24–336)

## 2022-01-30 LAB — IRON AND IRON BINDING CAPACITY (CC-WL,HP ONLY)
Iron: 107 ug/dL (ref 45–182)
Saturation Ratios: 25 % (ref 17.9–39.5)
TIBC: 430 ug/dL (ref 250–450)
UIBC: 323 ug/dL (ref 117–376)

## 2022-01-30 NOTE — Progress Notes (Signed)
Stuart Hill presents today for phlebotomy per MD orders. Phlebotomy procedure started at 1303 and ended at 1313. 534 cc removed via 16 G needle at [facility] site. Patient tolerated procedure well. Patient refused to wait 30 minutes post phlebotomy, released stable and ASX.

## 2022-01-30 NOTE — Patient Instructions (Signed)

## 2022-02-25 ENCOUNTER — Other Ambulatory Visit: Payer: Self-pay | Admitting: Cardiology

## 2022-02-25 ENCOUNTER — Other Ambulatory Visit: Payer: Self-pay | Admitting: Family Medicine

## 2022-03-20 ENCOUNTER — Encounter: Payer: Self-pay | Admitting: Family Medicine

## 2022-03-20 DIAGNOSIS — E639 Nutritional deficiency, unspecified: Secondary | ICD-10-CM

## 2022-03-20 DIAGNOSIS — F411 Generalized anxiety disorder: Secondary | ICD-10-CM

## 2022-03-20 DIAGNOSIS — E559 Vitamin D deficiency, unspecified: Secondary | ICD-10-CM

## 2022-03-20 MED ORDER — ALPRAZOLAM 1 MG PO TABS: ORAL_TABLET | ORAL | 2 refills | Status: DC

## 2022-03-20 MED ORDER — ROSUVASTATIN CALCIUM 10 MG PO TABS: 10.0000 mg | ORAL_TABLET | Freq: Every day | ORAL | 3 refills | Status: DC

## 2022-03-20 NOTE — Addendum Note (Signed)
Addended by: Darreld Mclean on: 03/20/2022 05:26 PM   Modules accepted: Orders

## 2022-03-23 ENCOUNTER — Inpatient Hospital Stay: Payer: Self-pay

## 2022-03-23 ENCOUNTER — Telehealth: Payer: Self-pay | Admitting: Family Medicine

## 2022-03-23 ENCOUNTER — Inpatient Hospital Stay (HOSPITAL_BASED_OUTPATIENT_CLINIC_OR_DEPARTMENT_OTHER): Payer: Self-pay | Admitting: Hematology & Oncology

## 2022-03-23 ENCOUNTER — Inpatient Hospital Stay: Payer: Self-pay | Attending: Hematology & Oncology

## 2022-03-23 ENCOUNTER — Encounter: Payer: Self-pay | Admitting: Hematology & Oncology

## 2022-03-23 ENCOUNTER — Other Ambulatory Visit: Payer: Self-pay | Admitting: Family Medicine

## 2022-03-23 VITALS — BP 149/101 | HR 64 | Resp 19

## 2022-03-23 VITALS — BP 150/100 | HR 68 | Temp 97.8°F | Resp 20 | Ht 75.0 in | Wt 199.0 lb

## 2022-03-23 DIAGNOSIS — F17200 Nicotine dependence, unspecified, uncomplicated: Secondary | ICD-10-CM | POA: Insufficient documentation

## 2022-03-23 DIAGNOSIS — D751 Secondary polycythemia: Secondary | ICD-10-CM | POA: Insufficient documentation

## 2022-03-23 DIAGNOSIS — D500 Iron deficiency anemia secondary to blood loss (chronic): Secondary | ICD-10-CM

## 2022-03-23 DIAGNOSIS — R197 Diarrhea, unspecified: Secondary | ICD-10-CM

## 2022-03-23 DIAGNOSIS — Z7982 Long term (current) use of aspirin: Secondary | ICD-10-CM | POA: Insufficient documentation

## 2022-03-23 DIAGNOSIS — D45 Polycythemia vera: Secondary | ICD-10-CM

## 2022-03-23 LAB — CMP (CANCER CENTER ONLY)
ALT: 17 U/L (ref 0–44)
AST: 18 U/L (ref 15–41)
Albumin: 4.8 g/dL (ref 3.5–5.0)
Alkaline Phosphatase: 62 U/L (ref 38–126)
Anion gap: 8 (ref 5–15)
BUN: 7 mg/dL (ref 6–20)
CO2: 29 mmol/L (ref 22–32)
Calcium: 9.5 mg/dL (ref 8.9–10.3)
Chloride: 102 mmol/L (ref 98–111)
Creatinine: 1.02 mg/dL (ref 0.61–1.24)
GFR, Estimated: >60 mL/min (ref >60)
Glucose, Bld: 100 mg/dL — ABNORMAL HIGH (ref 70–99)
Potassium: 4.5 mmol/L (ref 3.5–5.1)
Sodium: 139 mmol/L (ref 135–145)
Total Bilirubin: 0.4 mg/dL (ref 0.3–1.2)
Total Protein: 7.6 g/dL (ref 6.5–8.1)

## 2022-03-23 LAB — CBC WITH DIFFERENTIAL (CANCER CENTER ONLY)
Abs Immature Granulocytes: 0.03 10*3/uL (ref 0.00–0.07)
Basophils Absolute: 0.1 10*3/uL (ref 0.0–0.1)
Basophils Relative: 1 %
Eosinophils Absolute: 0.1 10*3/uL (ref 0.0–0.5)
Eosinophils Relative: 1 %
HCT: 52.8 % — ABNORMAL HIGH (ref 39.0–52.0)
Hemoglobin: 17.6 g/dL — ABNORMAL HIGH (ref 13.0–17.0)
Immature Granulocytes: 0 %
Lymphocytes Relative: 14 %
Lymphs Abs: 1.5 10*3/uL (ref 0.7–4.0)
MCH: 30.8 pg (ref 26.0–34.0)
MCHC: 33.3 g/dL (ref 30.0–36.0)
MCV: 92.3 fL (ref 80.0–100.0)
Monocytes Absolute: 0.9 10*3/uL (ref 0.1–1.0)
Monocytes Relative: 8 %
Neutro Abs: 8.3 10*3/uL — ABNORMAL HIGH (ref 1.7–7.7)
Neutrophils Relative %: 76 %
Platelet Count: 246 10*3/uL (ref 150–400)
RBC: 5.72 MIL/uL (ref 4.22–5.81)
RDW: 13.7 % (ref 11.5–15.5)
WBC Count: 10.7 10*3/uL — ABNORMAL HIGH (ref 4.0–10.5)
nRBC: 0 % (ref 0.0–0.2)

## 2022-03-23 LAB — FERRITIN: Ferritin: 17 ng/mL — ABNORMAL LOW (ref 24–336)

## 2022-03-23 NOTE — Progress Notes (Signed)
Stuart Hill presents today for phlebotomy per MD orders. Phlebotomy procedure started at 14:30 and ended at 14:38. 515 cc removed. Patient tolerated procedure well. 18G needle removed intact from Right AC. Post VSS. Patient declined to stay 30 minutes post phlebotomy.  Patient had snacks and drinks during phlebotomy.

## 2022-03-23 NOTE — Progress Notes (Signed)
Pt reports his wife has C diff and he is afraid he has it too Ordered testing kit for him

## 2022-03-23 NOTE — Telephone Encounter (Signed)
Pt's wife, [name], was recently diagnosed with c diff and they are not sure if it's contagious but they wanted to know if he could pick up a stool kit while he is here getting labs from upstairs to be tested. Please call to advise.

## 2022-03-23 NOTE — Telephone Encounter (Signed)
Pt came by today and picked up a stool kit.

## 2022-03-23 NOTE — Patient Instructions (Signed)

## 2022-03-23 NOTE — Progress Notes (Signed)
Hematology and Oncology Follow Up Visit  Stuart Hill [id number] [birth year] 52 y.o. 03/23/2022   Principle Diagnosis:  Secondary polycythemia JAK2 (-), smoker  LEFT Bell's palsy   Current Therapy:        Phlebotomy to maintain hematocrit below 45% 2 baby aspirin daily at bedtime - not taking per patient   Interim History:  Stuart Hill is here today for follow-up.  Apparently, there is an issue with respect to his heart.  He says that he was told that he has a aneurysm of the aortic root.  Apparently, for cardiologist told him this.  He then went to a thoracic surgeon.  The thoracic surgeon told that he did not have an aneurysm.  However, he will be followed up with another I think CT scan in about 6 months.  This really has gotten all worried.  He lost quite a bit of weight.  He was not able to work.  However, the thoracic surgeon told that he could go back to work if he wished.  His hemoglobin is still quite high.  We really need to get more aggressive with respect to get his hemoglobin down.  He is still smoking a little bit.  Hopefully, he will be able to stop smoking.  He does have underlying COPD.  His last iron studies that were done back in January showed a ferritin of 22 with an iron saturation of 25%.  He says a he does have little bit of a headache.  Does feel sluggish.  He says this happens when his blood count gets high.  Currently, his appetite is doing okay.  He is trying to watch what he eats.  Overall, I would have to say that his performance status is probably ECOG 1.  Medications:  Allergies as of 03/23/2022       Reactions   Plavix [clopidogrel Bisulfate] Other (See Comments)   Confusion, lethargy, slurred speech   Afrin [nasal Spray] Hives   Antihistamines, Chlorpheniramine-type Hives   Benadryl [diphenhydramine Hcl] Hives   Buprenorphine Hcl Hives   Carisoprodol Hives   Codeine Hives   Metoclopramide Hives   Morphine And Related Hives    Oxymetazoline Hives   Pheniramine Hives, Rash        Medication List        Accurate as of March 23, 2022  1:55 PM. If you have any questions, ask your nurse or doctor.          albuterol 108 (90 Base) MCG/ACT inhaler Commonly known as: VENTOLIN HFA Inhale 1-2 puffs into the lungs every 6 (six) hours as needed for wheezing or shortness of breath.   ALPRAZolam 1 MG tablet Commonly known as: XANAX TAKE 1 TABLET BY MOUTH THREE TIMES A DAY AS NEEDED FOR ANXIETY   aspirin EC 81 MG tablet Take 162 mg by mouth at bedtime. Swallow whole.   losartan 25 MG tablet Commonly known as: COZAAR TAKE 1 TABLET (25 MG TOTAL) BY MOUTH DAILY.   nitroGLYCERIN 0.4 MG SL tablet Commonly known as: NITROSTAT Place 1 tablet (0.4 mg total) under the tongue every 5 (five) minutes as needed for chest pain.   rosuvastatin 10 MG tablet Commonly known as: CRESTOR Take 1 tablet (10 mg total) by mouth at bedtime.   traMADol 50 MG tablet Commonly known as: ULTRAM TAKE 1 TABLET (50 MG TOTAL) BY MOUTH EVERY 8 (EIGHT) HOURS AS NEEDED FOR UP TO 5 DAYS.   VISINE OP Apply 1 drop to eye as  needed (itchy, dryness).        Allergies:  Allergies  Allergen Reactions   Plavix [Clopidogrel Bisulfate] Other (See Comments)    Confusion, lethargy, slurred speech   Afrin [Nasal Spray] Hives   Antihistamines, Chlorpheniramine-Type Hives   Benadryl [Diphenhydramine Hcl] Hives   Buprenorphine Hcl Hives   Carisoprodol Hives   Codeine Hives   Metoclopramide Hives   Morphine And Related Hives   Oxymetazoline Hives   Pheniramine Hives and Rash    Past Medical History, Surgical history, Social history, and Family History were reviewed and updated.  Review of Systems: Review of Systems  Constitutional:  Positive for malaise/fatigue.  HENT: Negative.    Eyes:  Positive for blurred vision.  Respiratory:  Positive for shortness of breath.   Cardiovascular:  Positive for palpitations.  Gastrointestinal:  Negative.   Genitourinary: Negative.   Musculoskeletal:  Positive for joint pain.  Skin: Negative.   Neurological:  Positive for headaches.  Endo/Heme/Allergies: Negative.   Psychiatric/Behavioral:  The patient is nervous/anxious.       Physical Exam:  height is '6\' 3"'$  (1.905 m) and weight is 199 lb (90.3 kg). His oral temperature is 97.8 F (36.6 C). His blood pressure is 150/100 (abnormal) and his pulse is 68. His respiration is 20 and oxygen saturation is 100%.   Wt Readings from Last 3 Encounters:  03/23/22 199 lb (90.3 kg)  10/13/21 194 lb (88 kg)  10/09/21 197 lb (89.4 kg)    Physical Exam Vitals reviewed.  HENT:     Head: Normocephalic and atraumatic.  Eyes:     Pupils: Pupils are equal, round, and reactive to light.  Cardiovascular:     Rate and Rhythm: Normal rate and regular rhythm.     Heart sounds: Normal heart sounds.  Pulmonary:     Effort: Pulmonary effort is normal.     Breath sounds: Normal breath sounds.  Abdominal:     General: Bowel sounds are normal.     Palpations: Abdomen is soft.  Musculoskeletal:        General: No tenderness or deformity. Normal range of motion.     Cervical back: Normal range of motion.  Lymphadenopathy:     Cervical: No cervical adenopathy.  Skin:    General: Skin is warm and dry.     Findings: No erythema or rash.  Neurological:     Mental Status: He is alert and oriented to person, place, and time.  Psychiatric:        Behavior: Behavior normal.        Thought Content: Thought content normal.        Judgment: Judgment normal.      Lab Results  Component Value Date   WBC 10.7 (H) 03/23/2022   HGB 17.6 (H) 03/23/2022   HCT 52.8 (H) 03/23/2022   MCV 92.3 03/23/2022   PLT 246 03/23/2022   Lab Results  Component Value Date   FERRITIN 22 (L) 01/30/2022   IRON 107 01/30/2022   TIBC 430 01/30/2022   UIBC 323 01/30/2022   IRONPCTSAT 25 01/30/2022   Lab Results  Component Value Date   RETICCTPCT 1.2 10/23/2020    RBC 5.72 03/23/2022   No results found for: "KPAFRELGTCHN", "LAMBDASER", "KAPLAMBRATIO" No results found for: "IGGSERUM", "IGA", "IGMSERUM" No results found for: "TOTALPROTELP", "ALBUMINELP", "A1GS", "A2GS", "BETS", "BETA2SER", "GAMS", "MSPIKE", "SPEI"   Chemistry      Component Value Date/Time   NA 139 01/30/2022 1234   NA 143 01/01/2017 1453  NA 139 11/12/2015 0945   K 4.3 01/30/2022 1234   K 4.3 01/01/2017 1453   K 3.7 11/12/2015 0945   CL 103 01/30/2022 1234   CL 103 01/01/2017 1453   CO2 28 01/30/2022 1234   CO2 27 01/01/2017 1453   CO2 22 11/12/2015 0945   BUN 11 01/30/2022 1234   BUN 18 01/01/2017 1453   BUN 13.1 11/12/2015 0945   CREATININE 0.90 01/30/2022 1234   CREATININE 0.96 10/05/2019 1042   CREATININE 0.9 11/12/2015 0945      Component Value Date/Time   CALCIUM 9.0 01/30/2022 1234   CALCIUM 8.8 01/01/2017 1453   CALCIUM 8.9 11/12/2015 0945   ALKPHOS 53 01/30/2022 1234   ALKPHOS 44 01/01/2017 1453   ALKPHOS 66 11/12/2015 0945   AST 16 01/30/2022 1234   AST 13 11/12/2015 0945   ALT 13 01/30/2022 1234   ALT 368 (HH) 01/01/2017 1453   ALT 14 11/12/2015 0945   BILITOT 0.5 01/30/2022 1234   BILITOT <0.22 11/12/2015 0945       Impression and Plan: Mr. Wilder is a 52 yo caucasian male with history of secondary polycythemia, JAK2 negative, smoker.   We really have to get his hemoglobin down.  As such, I will go ahead and phlebotomize him today and have him come back weekly for the next 3 weeks.  He will continue on the aspirin.  Hopefully, this whole issue with the aortic root aneurysm can get resolved.    Volanda Napoleon, MD 2/26/20241:55 PM

## 2022-03-24 ENCOUNTER — Other Ambulatory Visit (INDEPENDENT_AMBULATORY_CARE_PROVIDER_SITE_OTHER): Payer: Self-pay

## 2022-03-24 DIAGNOSIS — R197 Diarrhea, unspecified: Secondary | ICD-10-CM

## 2022-03-24 LAB — IRON AND IRON BINDING CAPACITY (CC-WL,HP ONLY)
Iron: 41 ug/dL — ABNORMAL LOW (ref 45–182)
Saturation Ratios: 8 % — ABNORMAL LOW (ref 17.9–39.5)
TIBC: 545 ug/dL — ABNORMAL HIGH (ref 250–450)
UIBC: 504 ug/dL — ABNORMAL HIGH (ref 117–376)

## 2022-03-24 NOTE — Addendum Note (Signed)
Addended by: Kelle Darting A on: 03/24/2022 01:40 PM   Modules accepted: Orders

## 2022-03-25 ENCOUNTER — Encounter: Payer: Self-pay | Admitting: Family Medicine

## 2022-03-25 LAB — CLOSTRIDIUM DIFFICILE BY PCR: Toxigenic C. Difficile by PCR: NEGATIVE

## 2022-03-30 ENCOUNTER — Inpatient Hospital Stay: Payer: Self-pay | Attending: Hematology & Oncology

## 2022-03-30 VITALS — BP 124/86 | HR 86 | Temp 97.7°F | Resp 18

## 2022-03-30 DIAGNOSIS — D751 Secondary polycythemia: Secondary | ICD-10-CM | POA: Insufficient documentation

## 2022-03-30 DIAGNOSIS — F17200 Nicotine dependence, unspecified, uncomplicated: Secondary | ICD-10-CM | POA: Insufficient documentation

## 2022-03-30 DIAGNOSIS — G510 Bell's palsy: Secondary | ICD-10-CM | POA: Insufficient documentation

## 2022-03-30 DIAGNOSIS — Z7982 Long term (current) use of aspirin: Secondary | ICD-10-CM | POA: Insufficient documentation

## 2022-03-30 NOTE — Progress Notes (Signed)
Stuart Hill presents today for phlebotomy per MD orders. Phlebotomy procedure started at 1355 and ended at 1403 via phlebotomy kit to right ac. 595 grams removed without difficulty. Patient refused to stay for 30 minutes after procedure. Patient tolerated procedure well. IV needle removed intact.

## 2022-03-30 NOTE — Progress Notes (Signed)
Pt refused to stay for 30 minutes post phlebotomy with no complaints.

## 2022-04-01 ENCOUNTER — Other Ambulatory Visit: Payer: Self-pay

## 2022-04-01 ENCOUNTER — Ambulatory Visit: Payer: Self-pay | Admitting: Physician Assistant

## 2022-04-05 ENCOUNTER — Emergency Department (HOSPITAL_BASED_OUTPATIENT_CLINIC_OR_DEPARTMENT_OTHER): Payer: Self-pay

## 2022-04-05 ENCOUNTER — Other Ambulatory Visit: Payer: Self-pay

## 2022-04-05 ENCOUNTER — Emergency Department (HOSPITAL_BASED_OUTPATIENT_CLINIC_OR_DEPARTMENT_OTHER)
Admission: EM | Admit: 2022-04-05 | Discharge: 2022-04-05 | Disposition: A | Discharge status: Home / Self Care | Payer: Self-pay | Attending: Emergency Medicine | Admitting: Emergency Medicine

## 2022-04-05 ENCOUNTER — Encounter (HOSPITAL_BASED_OUTPATIENT_CLINIC_OR_DEPARTMENT_OTHER): Payer: Self-pay | Admitting: Emergency Medicine

## 2022-04-05 DIAGNOSIS — R1084 Generalized abdominal pain: Secondary | ICD-10-CM

## 2022-04-05 DIAGNOSIS — K920 Hematemesis: Secondary | ICD-10-CM | POA: Insufficient documentation

## 2022-04-05 DIAGNOSIS — M778 Other enthesopathies, not elsewhere classified: Secondary | ICD-10-CM | POA: Insufficient documentation

## 2022-04-05 DIAGNOSIS — R051 Acute cough: Secondary | ICD-10-CM | POA: Insufficient documentation

## 2022-04-05 DIAGNOSIS — Z7982 Long term (current) use of aspirin: Secondary | ICD-10-CM | POA: Insufficient documentation

## 2022-04-05 DIAGNOSIS — I2510 Atherosclerotic heart disease of native coronary artery without angina pectoris: Secondary | ICD-10-CM | POA: Insufficient documentation

## 2022-04-05 LAB — CBC WITH DIFFERENTIAL/PLATELET
Abs Immature Granulocytes: 0.03 10*3/uL (ref 0.00–0.07)
Basophils Absolute: 0 10*3/uL (ref 0.0–0.1)
Basophils Relative: 1 %
Eosinophils Absolute: 0 10*3/uL (ref 0.0–0.5)
Eosinophils Relative: 0 %
HCT: 42.9 % (ref 39.0–52.0)
Hemoglobin: 14.5 g/dL (ref 13.0–17.0)
Immature Granulocytes: 0 %
Lymphocytes Relative: 18 %
Lymphs Abs: 1.5 10*3/uL (ref 0.7–4.0)
MCH: 30.6 pg (ref 26.0–34.0)
MCHC: 33.8 g/dL (ref 30.0–36.0)
MCV: 90.5 fL (ref 80.0–100.0)
Monocytes Absolute: 0.5 10*3/uL (ref 0.1–1.0)
Monocytes Relative: 7 %
Neutro Abs: 6.1 10*3/uL (ref 1.7–7.7)
Neutrophils Relative %: 74 %
Platelets: 274 10*3/uL (ref 150–400)
RBC: 4.74 MIL/uL (ref 4.22–5.81)
RDW: 13.4 % (ref 11.5–15.5)
WBC: 8.2 10*3/uL (ref 4.0–10.5)
nRBC: 0 % (ref 0.0–0.2)

## 2022-04-05 LAB — COMPREHENSIVE METABOLIC PANEL
ALT: 22 U/L (ref 0–44)
AST: 21 U/L (ref 15–41)
Albumin: 4.1 g/dL (ref 3.5–5.0)
Alkaline Phosphatase: 55 U/L (ref 38–126)
Anion gap: 6 (ref 5–15)
BUN: 10 mg/dL (ref 6–20)
CO2: 27 mmol/L (ref 22–32)
Calcium: 8.6 mg/dL — ABNORMAL LOW (ref 8.9–10.3)
Chloride: 103 mmol/L (ref 98–111)
Creatinine, Ser: 0.85 mg/dL (ref 0.61–1.24)
GFR, Estimated: >60 mL/min (ref >60)
Glucose, Bld: 104 mg/dL — ABNORMAL HIGH (ref 70–99)
Potassium: 4.1 mmol/L (ref 3.5–5.1)
Sodium: 136 mmol/L (ref 135–145)
Total Bilirubin: 0.8 mg/dL (ref 0.3–1.2)
Total Protein: 7.2 g/dL (ref 6.5–8.1)

## 2022-04-05 LAB — URINALYSIS, ROUTINE W REFLEX MICROSCOPIC
Bilirubin Urine: NEGATIVE
Glucose, UA: NEGATIVE mg/dL
Hgb urine dipstick: NEGATIVE
Ketones, ur: NEGATIVE mg/dL
Leukocytes,Ua: NEGATIVE
Nitrite: NEGATIVE
Protein, ur: NEGATIVE mg/dL
Specific Gravity, Urine: 1.015 (ref 1.005–1.030)
pH: 6 (ref 5.0–8.0)

## 2022-04-05 LAB — LIPASE, BLOOD: Lipase: 29 U/L (ref 11–51)

## 2022-04-05 MED ORDER — IOHEXOL 300 MG/ML  SOLN
100.0000 mL | Freq: Once | INTRAMUSCULAR | Status: AC | PRN
  Administered 2022-04-05: 100 mL via INTRAVENOUS

## 2022-04-05 MED ORDER — PHENAZOPYRIDINE HCL 200 MG PO TABS: 200.0000 mg | ORAL_TABLET | Freq: Three times a day (TID) | ORAL | 0 refills | Status: DC

## 2022-04-05 NOTE — ED Provider Notes (Signed)
[facility] Provider Note   CSN: [id number] Arrival date & time: 04/05/22  1229     History  Chief Complaint  Patient presents with   Abdominal Pain    Stuart Hill is a 52 y.o. male with past medical history significant for polycythemia vera, CAD, hyperlipidemia, emphysema presents to the ED with multiple complaints.  Patient is complaining of abdominal pain, bloating, groin spasms, and " feeling a fluttering in his penis" when urinating.  Patient also reports he has had increased coughing and using his inhaler more often.  He reports following coughing episodes he vomits up blood-streaked mucus.  He reports that his abdominal pain radiates to his back.  He also has been having blurry vision for a "long while" with elevated blood pressure.  Patient is compliant with his medications.  Patient has also been attending phlebotomy sessions for his polycythemia vera weekly.  Patient has been around a family member who was C. difficile positive.  Family member has completed her antibiotic treatment for it.  Denies fever, chills, shortness of breath, chest tightness, chest pain, palpitations, melena, hematochezia, constipation, difficulty urinating, flank pain, hematuria, penile discharge, penile pain, syncope.  Patient also has concern for his left middle finger being mildly swollen and bent at an angle.      Home Medications Prior to Admission medications   Medication Sig Start Date End Date Taking? Authorizing Provider  phenazopyridine (PYRIDIUM) 200 MG tablet Take 1 tablet (200 mg total) by mouth 3 (three) times daily. 04/05/22  Yes Meryem Haertel R, PA  albuterol (VENTOLIN HFA) 108 (90 Base) MCG/ACT inhaler Inhale 1-2 puffs into the lungs every 6 (six) hours as needed for wheezing or shortness of breath. 06/25/21   Copland, Gay Filler, MD  ALPRAZolam (XANAX) 1 MG tablet TAKE 1 TABLET BY MOUTH THREE TIMES A DAY AS NEEDED FOR ANXIETY 03/20/22    Copland, Gay Filler, MD  aspirin EC 81 MG tablet Take 162 mg by mouth at bedtime. Swallow whole.    [provider]  losartan (COZAAR) 25 MG tablet TAKE 1 TABLET (25 MG TOTAL) BY MOUTH DAILY. 02/25/22   Tobb, Kardie, DO  nitroGLYCERIN (NITROSTAT) 0.4 MG SL tablet Place 1 tablet (0.4 mg total) under the tongue every 5 (five) minutes as needed for chest pain. 02/19/21   Copland, Gay Filler, MD  rosuvastatin (CRESTOR) 10 MG tablet Take 1 tablet (10 mg total) by mouth at bedtime. Patient not taking: Reported on 03/23/2022 03/20/22   Copland, Gay Filler, MD  Tetrahydrozoline HCl (VISINE OP) Apply 1 drop to eye as needed (itchy, dryness).    [provider]  traMADol (ULTRAM) 50 MG tablet TAKE 1 TABLET (50 MG TOTAL) BY MOUTH EVERY 8 (EIGHT) HOURS AS NEEDED FOR UP TO 5 DAYS. 02/25/22   Copland, Gay Filler, MD      Allergies    Plavix [clopidogrel bisulfate]; Afrin [nasal spray]; Antihistamines, chlorpheniramine-type; Benadryl [diphenhydramine hcl]; Buprenorphine hcl; Carisoprodol; Codeine; Metoclopramide; Morphine and related; Oxymetazoline; and Pheniramine    Review of Systems   Review of Systems  Constitutional:  Negative for chills and fever.  Respiratory:  Positive for cough. Negative for chest tightness and shortness of breath.   Cardiovascular:  Negative for chest pain and palpitations.  Gastrointestinal:  Positive for abdominal pain, diarrhea, nausea and vomiting. Negative for blood in stool and constipation.  Genitourinary:  Positive for dysuria and urgency. Negative for decreased urine volume, difficulty urinating, flank pain, frequency, hematuria, penile  discharge and penile pain.  Musculoskeletal:  Positive for back pain.  Neurological:  Negative for syncope.    Physical Exam Updated Vital Signs BP (!) 150/87 (BP Location: Left Arm)   Pulse (!) 58   Temp 98.2 F (36.8 C) (Oral)   Resp 18   Ht '6\' 3"'$  (1.905 m)   Wt 90.3 kg   SpO2 100%   BMI 24.87 kg/m  Physical  Exam  ED Results / Procedures / Treatments   Labs (all labs ordered are listed, but only abnormal results are displayed) Labs Reviewed  COMPREHENSIVE METABOLIC PANEL - Abnormal; Notable for the following components:      Result Value   Glucose, Bld 104 (*)    Calcium 8.6 (*)    All other components within normal limits  CBC WITH DIFFERENTIAL/PLATELET  LIPASE, BLOOD  URINALYSIS, ROUTINE W REFLEX MICROSCOPIC    EKG EKG Interpretation  Date/Time:  Sunday April 05 2022 14:26:34 EDT Ventricular Rate:  60 PR Interval:  166 QRS Duration: 99 QT Interval:  420 QTC Calculation: 420 R Axis:   -17 Text Interpretation: Sinus rhythm Borderline left axis deviation Confirmed by Lavenia Atlas [phone]) on 04/05/2022 5:05:41 PM  Radiology DG Finger Middle Left  Result Date: 04/05/2022 CLINICAL DATA:  Pain and swelling EXAM: LEFT MIDDLE FINGER 2+V COMPARISON:  None Available. FINDINGS: There is soft tissue swelling of the third digit. There is no foreign body. There is no evidence of fracture or dislocation. There is no evidence of arthropathy or other focal bone abnormality. IMPRESSION: Soft tissue swelling of the third digit without evidence of fracture or dislocation. Electronically Signed   By: Ronney Asters M.D.   On: 04/05/2022 17:06   DG Hand Complete Right  Result Date: 04/05/2022 CLINICAL DATA:  Right hand pain and swelling.  No injury EXAM: RIGHT HAND - COMPLETE 3+ VIEW COMPARISON:  03/04/2003 FINDINGS: There is no evidence of fracture or dislocation. Early mild joint space narrowing throughout the hand. No erosions. Soft tissues are unremarkable. IMPRESSION: Mild degenerative changes of the right hand. No acute findings. Electronically Signed   By: Davina Poke D.O.   On: 04/05/2022 17:03   CT ABDOMEN PELVIS W CONTRAST  Result Date: 04/05/2022 CLINICAL DATA:  52 year old male with acute abdominal and pelvic pain. EXAM: CT ABDOMEN AND PELVIS WITH CONTRAST TECHNIQUE: Multidetector CT  imaging of the abdomen and pelvis was performed using the standard protocol following bolus administration of intravenous contrast. RADIATION DOSE REDUCTION: This exam was performed according to the departmental dose-optimization program which includes automated exposure control, adjustment of the mA and/or kV according to patient size and/or use of iterative reconstruction technique. CONTRAST:  158m OMNIPAQUE IOHEXOL 300 MG/ML  SOLN COMPARISON:  09/22/2021 CT and prior studies FINDINGS: Lower chest: No acute abnormality. Hepatobiliary: The liver and gallbladder are unremarkable. There is no evidence of intrahepatic or extrahepatic biliary dilatation. Pancreas: Unremarkable Spleen: Unremarkable Adrenals/Urinary Tract: The kidneys, adrenal glands and bladder are unremarkable. Stomach/Bowel: Stomach is within normal limits. Appendix appears normal. No evidence of bowel wall thickening, distention, or inflammatory changes. Vascular/Lymphatic: Aortic atherosclerosis. No enlarged abdominal or pelvic lymph nodes. Reproductive: Prostate is unremarkable. Other: No ascites, focal collection or pneumoperitoneum. Musculoskeletal: No acute or suspicious bony abnormalities are noted. IMPRESSION: 1. No evidence of acute abnormality. 2.  Aortic Atherosclerosis (ICD10-I70.0). Electronically Signed   By: JMargarette CanadaM.D.   On: 04/05/2022 17:02   DG Chest 2 View  Result Date: 04/05/2022 CLINICAL DATA:  Cough. EXAM: CHEST -  2 VIEW COMPARISON:  08/08/2021 FINDINGS: The lungs are clear without focal pneumonia, edema, pneumothorax or pleural effusion. The cardiopericardial silhouette is within normal limits for size. The visualized bony structures of the thorax are unremarkable. IMPRESSION: No active cardiopulmonary disease. Electronically Signed   By: Misty Stanley M.D.   On: 04/05/2022 14:33    Procedures Procedures    Medications Ordered in ED Medications  iohexol (OMNIPAQUE) 300 MG/ML solution 100 mL (100 mLs Intravenous  Contrast Given 04/05/22 1636)    ED Course/ Medical Decision Making/ A&P                             Medical Decision Making Amount and/or Complexity of Data Reviewed Labs: ordered. Radiology: ordered.  Risk Prescription drug management.   This patient presents to the ED with chief complaint(s) of abdominal pain, back pain, urinary changes, cough, hematemesis with pertinent past medical history of polycythemia vera, emphysema, CAD, tobacco abuse.  The complaint involves an extensive differential diagnosis and also carries with it a high risk of complications and morbidity.    The differential diagnosis includes gastroparesis, gastritis, gastroenteritis, bowel obstruction, UTI, pancreatitis, diverticulitis, flexor tendinitis, acute flexor tendon rupture or tear, bacterial infection, GI bleed  The initial plan is to obtain baseline labs, UA, and imaging of chest, hands, and CT abdomen pelvis  Additional history obtained: Additional history obtained from family member who is at bedside, family member did test positive for C. difficile and she has completed her antibiotic treatment for it and states that she was "told she would no longer be contagious" Records reviewed previous labs and therapeutic phlebotomy notes.  Patient did have a hemoglobin of 17.5 almost 2 weeks ago.  Initial Assessment:   On exam, patient is resting comfortably in bed and does not appear to be in acute distress.  Abdomen is mildly distended with tenderness to palpation in the lower quadrants.  No appreciable hernias.  Skin is warm and dry without rashes.  Lungs are clear to auscultation bilaterally with adequate tidal volume.  Heart rate is normal in the 60s with regular rhythm.  Left middle finger mildly swollen and patient is unable to actively extend his DIP.  Passive flexion and extension is still intact for both PIP and DIP of this finger.  Sensation is intact.  Cap refill is normal.  During exam, patient also  complains of pain and swelling to his right hand where he has had several incidences of boxers fractures.  Patient does have mild swelling in this area with tenderness to palpation.  No erythema or obvious deformity.  Independent ECG/labs interpretation:  The following labs were independently interpreted:  CBC without leukocytosis or erythrocytosis.  Hemoglobin is within normal range at 14.5 today.  Metabolic panel with normal LFTs, no major electrolyte disturbances, he does have mild hypocalcemia.  Renal function is normal.  Lipase is normal, not suggestive of pancreatitis.  Urinalysis is negative for UTI or hematuria.  Independent visualization and interpretation of imaging: I independently visualized the following imaging with scope of interpretation limited to determining acute life threatening conditions related to emergency care: X-ray of left middle finger revealed soft tissue swelling but no bony abnormality.  X-ray of right hand with mild degenerative changes but no acute abnormality.  Chest x-ray without evidence of pneumonia, pneumothorax, or pleural effusion.  CT abdomen pelvis without bowel obstruction, signs of infection or inflammation, or intra-abdominal abnormality to explain patient's symptoms.  I  agree with radiologist interpretation.  Treatment and Reassessment: Patient was given a splint for his left middle finger to keep it fully extended.  Patient may have flexor tendinitis or have sustained injury to his flexor tendon in this finger.  Recommended patient follow-up with his primary care provider regarding his multiple complaints.  Will also refer patient to gastroenterology for his abdominal symptoms as he may require endoscopy and/or colonoscopy at this time.  Patient advised to continue attending his therapeutic phlebotomy sessions as scheduled.  Due to patient having uncomfortable feelings with urination, pre and postvoid bladder scan was performed.  Patient's postvoid was 60  mL.  Urine was also yellow and normal in appearance.  Patient may be experiencing urethral spasms.  Will send patient home on Pyridium to help with the sensation.  Disposition:   The patient has been appropriately medically screened and/or stabilized in the ED. I have low suspicion for any other emergent medical condition which would require further screening, evaluation or treatment in the ED or require inpatient management. At time of discharge the patient is hemodynamically stable and in no acute distress. I have discussed work-up results and diagnosis with patient and answered all questions. Patient is agreeable with discharge plan. We discussed strict return precautions for returning to the emergency department and they verbalized understanding.    Social Determinants of Health:   Patient's  tobacco use   increases the complexity of managing their presentation        Final Clinical Impression(s) / ED Diagnoses Final diagnoses:  Generalized abdominal pain  Flexor tendinitis of hand  Acute cough  Hematemesis with nausea    Rx / DC Orders ED Discharge Orders          Ordered    phenazopyridine (PYRIDIUM) 200 MG tablet  3 times daily        04/05/22 1909              Pat Kocher, [place] 04/05/22 1928    Lorelle Gibbs, DO 04/05/22 2120

## 2022-04-05 NOTE — ED Notes (Signed)
Pt transported to radiology.

## 2022-04-05 NOTE — Discharge Instructions (Addendum)
Thank you for allowing me to be part of your care today.  Your workup today was overall reassuring that you do not have an emergent cause for your symptoms.  Your CT scan was negative for any type of bowel obstruction, infection, or inflammatory changes.  Your blood workup was also very reassuring.  Your hemoglobin is now in a normal level.  Please continue to attend your phlebotomy appointments as scheduled.  Your urine was negative for any infection.  I am sending you home on a prescription of Pyridium to help with urethral spasms as this may be the sensation you are experiencing.  I am also referring you to gastroenterology for a follow-up.  You may require endoscopy/colonoscopy.  I also recommend seeing your primary care provider for follow-up.

## 2022-04-05 NOTE — ED Notes (Signed)
Patient ambulated to X-ray 

## 2022-04-05 NOTE — ED Triage Notes (Signed)
Pt c/o abdominal pain, bloating, coughing up blood, back pain that radiates into penis area x 10 days. Family member had C-Diff. Pt also c/o blurry vision for a while, elevated blood pressure, groin spasms when he urinates, weakness, cold sweats, increased use of inhaler, incontinence of urine.

## 2022-04-05 NOTE — ED Notes (Signed)
Patient transported to CT 

## 2022-04-06 ENCOUNTER — Inpatient Hospital Stay: Payer: Self-pay

## 2022-04-07 NOTE — Progress Notes (Unsigned)
[facility] at [facility] [address] [place], [place] [address] [phone]  Date:  04/08/2022   Name:  Stuart Hill   DOB:  04/05/1970   MRN:  [id number]  PCP:  Darreld Mclean, MD    Chief Complaint: No chief complaint on file.   History of Present Illness:  Stuart Hill is a 52 y.o. very pleasant male patient who presents with the following:  Pt seen today for follow-up- last seen by myself in August - history of COPD, hyperlipidemia, polycythemia vera, dilation of the aorta, CAD, anxiety  He was seen in the ER on 3/10 with various concerns Patient is complaining of abdominal pain, bloating, groin spasms, and " feeling a fluttering in his penis" when urinating.  Patient also reports he has had increased coughing and using his inhaler more often.  He reports following coughing episodes he vomits up blood-streaked mucus.  He reports that his abdominal pain radiates to his back.  He also has been having blurry vision for a "long while" with elevated blood pressure.  Patient is compliant with his medications.  Patient has also been attending phlebotomy sessions for his polycythemia vera weekly.  Patient has been around a family member who was C. difficile positive.  Family member has completed her antibiotic treatment for it.  Denies fever, chills, shortness of breath, chest tightness, chest pain, palpitations, melena, hematochezia, constipation, difficulty urinating, flank pain, hematuria, penile discharge, penile pain, syncope.  Patient also has concern for his left middle finger being mildly swollen and bent at an angle    CT abd pelvis was negative Bladder scan showed normal PVR Patient Active Problem List   Diagnosis Date Noted   MDD (major depressive disorder), recurrent severe, without psychosis ([place]) 10/01/2021   Paroxysmal SVT (supraventricular tachycardia)    Emphysema of lung ([place])    Palpitations 01/09/2021    Minimal CAD 01/09/2021   Mixed hyperlipidemia 01/09/2021   Ascending aorta dilatation ([place]) 01/09/2021   Allergy 08/19/2020   Anxiety 08/19/2020   Arthritis 08/19/2020   Bell's palsy 08/19/2020   Chest pain 07/26/2020   Family history of early CAD 07/26/2020   DOE (dyspnea on exertion) 12/14/2019   Angina pectoris ([place]) 11/03/2019   Tobacco abuse 11/03/2019   Other hyperlipidemia 11/03/2019   Tonsillar hypertrophy 04/30/2017   Obstructive sleep apnea 04/23/2017   Polycythemia, secondary 09/29/2016   Polycythemia vera ([place]) 05/03/2015   Hemoptysis 04/03/2015   Condylomata acuminata 01/18/2015   Generalized anxiety disorder 03/05/2014    Past Medical History:  Diagnosis Date   Allergy    Anxiety    Arthritis    Bell's palsy    COPD (chronic obstructive pulmonary disease) ([place])    Erythrocytosis 04/10/2015   Hyperlipidemia    Polycythemia vera ([place]) 05/03/2015    Past Surgical History:  Procedure Laterality Date   DENTAL SURGERY     HAND SURGERY     TONSILLECTOMY Bilateral 04/30/2017   Procedure: TONSILLECTOMY;  Surgeon: Jerrell Belfast, MD;  Location: [facility];  Service: ENT;  Laterality: Bilateral;    Social History   Tobacco Use   Smoking status: Every Day    Packs/day: 0.25    Years: 24.00    Total pack years: 6.00    Types: Cigarettes   Smokeless tobacco: Never  Vaping Use   Vaping Use: Never used  Substance Use Topics   Alcohol use: Yes    Alcohol/week: 1.0  standard drink of alcohol    Types: 1 Cans of beer per week    Comment: 6-10 beer a week   Drug use: Yes    Types: Other-see comments, Marijuana    Comment: Marijuana every other day.    Family History  Problem Relation Age of Onset   COPD Mother    Fibromyalgia Mother    Heart disease Father    Stroke Father    Depression Brother    Heart disease Brother    Heart disease Maternal Grandmother    Stroke Maternal Grandmother    Heart disease Maternal Grandfather    Stroke  Maternal Grandfather    Heart disease Paternal Grandmother    Stroke Paternal Grandmother    Heart disease Paternal Grandfather    Stroke Paternal Grandfather    Stroke Paternal Aunt     Allergies  Allergen Reactions   Plavix [Clopidogrel Bisulfate] Other (See Comments)    Confusion, lethargy, slurred speech   Afrin [Nasal Spray] Hives   Antihistamines, Chlorpheniramine-Type Hives   Benadryl [Diphenhydramine Hcl] Hives   Buprenorphine Hcl Hives   Carisoprodol Hives   Codeine Hives   Metoclopramide Hives   Morphine And Related Hives   Oxymetazoline Hives   Pheniramine Hives and Rash    Medication list has been reviewed and updated.  Current Outpatient Medications on File Prior to Visit  Medication Sig Dispense Refill   albuterol (VENTOLIN HFA) 108 (90 Base) MCG/ACT inhaler Inhale 1-2 puffs into the lungs every 6 (six) hours as needed for wheezing or shortness of breath. 18 g 3   ALPRAZolam (XANAX) 1 MG tablet TAKE 1 TABLET BY MOUTH THREE TIMES A DAY AS NEEDED FOR ANXIETY 90 tablet 2   aspirin EC 81 MG tablet Take 162 mg by mouth at bedtime. Swallow whole.     losartan (COZAAR) 25 MG tablet TAKE 1 TABLET (25 MG TOTAL) BY MOUTH DAILY. 90 tablet 2   nitroGLYCERIN (NITROSTAT) 0.4 MG SL tablet Place 1 tablet (0.4 mg total) under the tongue every 5 (five) minutes as needed for chest pain. 25 tablet 6   phenazopyridine (PYRIDIUM) 200 MG tablet Take 1 tablet (200 mg total) by mouth 3 (three) times daily. 6 tablet 0   rosuvastatin (CRESTOR) 10 MG tablet Take 1 tablet (10 mg total) by mouth at bedtime. (Patient not taking: Reported on 03/23/2022) 90 tablet 3   Tetrahydrozoline HCl (VISINE OP) Apply 1 drop to eye as needed (itchy, dryness).     traMADol (ULTRAM) 50 MG tablet TAKE 1 TABLET (50 MG TOTAL) BY MOUTH EVERY 8 (EIGHT) HOURS AS NEEDED FOR UP TO 5 DAYS. 15 tablet 0   No current facility-administered medications on file prior to visit.    Review of Systems:  As per HPI- otherwise  negative.   Physical Examination: There were no vitals filed for this visit. There were no vitals filed for this visit. There is no height or weight on file to calculate BMI. Ideal Body Weight:    GEN: no acute distress. HEENT: Atraumatic, Normocephalic.  Ears and Nose: No external deformity. CV: RRR, No M/G/R. No JVD. No thrill. No extra heart sounds. PULM: CTA B, no wheezes, crackles, rhonchi. No retractions. No resp. distress. No accessory muscle use. ABD: S, NT, ND, +BS. No rebound. No HSM. EXTR: No c/c/e PSYCH: Normally interactive. Conversant.    Assessment and Plan: ***  Signed Lamar Blinks, MD

## 2022-04-08 ENCOUNTER — Ambulatory Visit (INDEPENDENT_AMBULATORY_CARE_PROVIDER_SITE_OTHER): Payer: Self-pay | Admitting: Family Medicine

## 2022-04-08 VITALS — BP 130/80 | HR 95 | Temp 98.2°F | Resp 18 | Ht 77.0 in | Wt 198.4 lb

## 2022-04-08 DIAGNOSIS — A09 Infectious gastroenteritis and colitis, unspecified: Secondary | ICD-10-CM

## 2022-04-08 DIAGNOSIS — R300 Dysuria: Secondary | ICD-10-CM

## 2022-04-08 DIAGNOSIS — Z125 Encounter for screening for malignant neoplasm of prostate: Secondary | ICD-10-CM

## 2022-04-08 NOTE — Patient Instructions (Addendum)
It was good to see you again today- I will be in touch with your c diff and urine culture and your PSA asap If your c diff is negative again we will refer to GI

## 2022-04-09 ENCOUNTER — Encounter: Payer: Self-pay | Admitting: Family Medicine

## 2022-04-09 LAB — URINE CULTURE
MICRO NUMBER:: 14687233
Result:: NO GROWTH
SPECIMEN QUALITY:: ADEQUATE

## 2022-04-09 LAB — PSA: PSA: 0.51 ng/mL (ref 0.10–4.00)

## 2022-04-10 ENCOUNTER — Other Ambulatory Visit: Payer: Self-pay

## 2022-04-10 ENCOUNTER — Inpatient Hospital Stay: Payer: Self-pay

## 2022-04-10 VITALS — BP 137/91 | HR 63 | Temp 97.8°F | Resp 17

## 2022-04-10 DIAGNOSIS — A09 Infectious gastroenteritis and colitis, unspecified: Secondary | ICD-10-CM

## 2022-04-10 DIAGNOSIS — D751 Secondary polycythemia: Secondary | ICD-10-CM

## 2022-04-10 NOTE — Progress Notes (Signed)
Stuart Hill presents today for phlebotomy per MD orders. Phlebotomy procedure started at 1337 and ended at 1355. 500  grams removed. Patient refused to be observed for 30 minutes after procedure . Patient tolerated procedure well.Snacks and hydration offered. Pt refused. IV needle removed intact.

## 2022-04-10 NOTE — Patient Instructions (Signed)

## 2022-04-12 LAB — CLOSTRIDIUM DIFFICILE BY PCR: Toxigenic C. Difficile by PCR: NEGATIVE

## 2022-04-13 ENCOUNTER — Inpatient Hospital Stay: Payer: Self-pay

## 2022-04-13 ENCOUNTER — Encounter: Payer: Self-pay | Admitting: Family Medicine

## 2022-04-13 DIAGNOSIS — N529 Male erectile dysfunction, unspecified: Secondary | ICD-10-CM

## 2022-04-17 ENCOUNTER — Inpatient Hospital Stay: Payer: Self-pay

## 2022-04-17 NOTE — Progress Notes (Signed)
Stuart Hill presents today for phlebotomy per MD orders. Phlebotomy procedure started at 13:45 and ended at 13:50. 515 mls removed. Patient observed for 30 minutes after procedure without any incident. Patient tolerated procedure well. IV needle removed intact. Patient had snacks and drinks post phlebotomy.

## 2022-04-17 NOTE — Progress Notes (Signed)
p 

## 2022-04-17 NOTE — Patient Instructions (Signed)

## 2022-04-22 ENCOUNTER — Inpatient Hospital Stay: Payer: Self-pay

## 2022-04-22 ENCOUNTER — Inpatient Hospital Stay (HOSPITAL_BASED_OUTPATIENT_CLINIC_OR_DEPARTMENT_OTHER): Payer: Self-pay | Admitting: Family

## 2022-04-22 VITALS — BP 137/92 | HR 67 | Temp 98.5°F | Resp 17 | Ht 77.0 in | Wt 198.0 lb

## 2022-04-22 DIAGNOSIS — E559 Vitamin D deficiency, unspecified: Secondary | ICD-10-CM

## 2022-04-22 DIAGNOSIS — D751 Secondary polycythemia: Secondary | ICD-10-CM

## 2022-04-22 DIAGNOSIS — E639 Nutritional deficiency, unspecified: Secondary | ICD-10-CM

## 2022-04-22 DIAGNOSIS — D45 Polycythemia vera: Secondary | ICD-10-CM

## 2022-04-22 DIAGNOSIS — D500 Iron deficiency anemia secondary to blood loss (chronic): Secondary | ICD-10-CM

## 2022-04-22 LAB — IRON AND IRON BINDING CAPACITY (CC-WL,HP ONLY)
Iron: 24 ug/dL — ABNORMAL LOW (ref 45–182)
Saturation Ratios: 4 % — ABNORMAL LOW (ref 17.9–39.5)
TIBC: 598 ug/dL — ABNORMAL HIGH (ref 250–450)
UIBC: 574 ug/dL — ABNORMAL HIGH (ref 117–376)

## 2022-04-22 LAB — LACTATE DEHYDROGENASE: LDH: 146 U/L (ref 98–192)

## 2022-04-22 LAB — CBC WITH DIFFERENTIAL (CANCER CENTER ONLY)
Abs Immature Granulocytes: 0.07 10*3/uL (ref 0.00–0.07)
Basophils Absolute: 0.1 10*3/uL (ref 0.0–0.1)
Basophils Relative: 1 %
Eosinophils Absolute: 0.1 10*3/uL (ref 0.0–0.5)
Eosinophils Relative: 1 %
HCT: 40.2 % (ref 39.0–52.0)
Hemoglobin: 13.2 g/dL (ref 13.0–17.0)
Immature Granulocytes: 1 %
Lymphocytes Relative: 21 %
Lymphs Abs: 1.7 10*3/uL (ref 0.7–4.0)
MCH: 29.7 pg (ref 26.0–34.0)
MCHC: 32.8 g/dL (ref 30.0–36.0)
MCV: 90.3 fL (ref 80.0–100.0)
Monocytes Absolute: 0.8 10*3/uL (ref 0.1–1.0)
Monocytes Relative: 10 %
Neutro Abs: 5.2 10*3/uL (ref 1.7–7.7)
Neutrophils Relative %: 66 %
Platelet Count: 347 10*3/uL (ref 150–400)
RBC: 4.45 MIL/uL (ref 4.22–5.81)
RDW: 12.8 % (ref 11.5–15.5)
WBC Count: 7.9 10*3/uL (ref 4.0–10.5)
nRBC: 0 % (ref 0.0–0.2)

## 2022-04-22 LAB — RETICULOCYTES
Immature Retic Fract: 18.1 % — ABNORMAL HIGH (ref 2.3–15.9)
RBC.: 4.44 MIL/uL (ref 4.22–5.81)
Retic Count, Absolute: 81.3 10*3/uL (ref 19.0–186.0)
Retic Ct Pct: 1.8 % (ref 0.4–3.1)

## 2022-04-22 LAB — CMP (CANCER CENTER ONLY)
ALT: 18 U/L (ref 0–44)
AST: 20 U/L (ref 15–41)
Albumin: 4.6 g/dL (ref 3.5–5.0)
Alkaline Phosphatase: 58 U/L (ref 38–126)
Anion gap: 9 (ref 5–15)
BUN: 12 mg/dL (ref 6–20)
CO2: 27 mmol/L (ref 22–32)
Calcium: 9.1 mg/dL (ref 8.9–10.3)
Chloride: 101 mmol/L (ref 98–111)
Creatinine: 0.79 mg/dL (ref 0.61–1.24)
GFR, Estimated: >60 mL/min (ref >60)
Glucose, Bld: 95 mg/dL (ref 70–99)
Potassium: 4.4 mmol/L (ref 3.5–5.1)
Sodium: 137 mmol/L (ref 135–145)
Total Bilirubin: 0.5 mg/dL (ref 0.3–1.2)
Total Protein: 7.3 g/dL (ref 6.5–8.1)

## 2022-04-22 LAB — FERRITIN: Ferritin: 5 ng/mL — ABNORMAL LOW (ref 24–336)
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
## 2022-04-30 MED ORDER — SILDENAFIL CITRATE 20 MG PO TABS: ORAL_TABLET | ORAL | 5 refills | Status: DC

## 2022-05-22 ENCOUNTER — Inpatient Hospital Stay: Payer: Self-pay

## 2022-05-22 ENCOUNTER — Inpatient Hospital Stay: Payer: Self-pay | Attending: Hematology & Oncology

## 2022-05-22 DIAGNOSIS — D751 Secondary polycythemia: Secondary | ICD-10-CM | POA: Insufficient documentation

## 2022-05-22 DIAGNOSIS — D500 Iron deficiency anemia secondary to blood loss (chronic): Secondary | ICD-10-CM

## 2022-05-22 LAB — CBC WITH DIFFERENTIAL (CANCER CENTER ONLY)
Abs Immature Granulocytes: 0.03 10*3/uL (ref 0.00–0.07)
Basophils Absolute: 0.1 10*3/uL (ref 0.0–0.1)
Basophils Relative: 1 %
Eosinophils Absolute: 0.1 10*3/uL (ref 0.0–0.5)
Eosinophils Relative: 1 %
HCT: 41.6 % (ref 39.0–52.0)
Hemoglobin: 13.4 g/dL (ref 13.0–17.0)
Immature Granulocytes: 0 %
Lymphocytes Relative: 19 %
Lymphs Abs: 2 10*3/uL (ref 0.7–4.0)
MCH: 27.5 pg (ref 26.0–34.0)
MCHC: 32.2 g/dL (ref 30.0–36.0)
MCV: 85.2 fL (ref 80.0–100.0)
Monocytes Absolute: 0.7 10*3/uL (ref 0.1–1.0)
Monocytes Relative: 7 %
Neutro Abs: 7.9 10*3/uL — ABNORMAL HIGH (ref 1.7–7.7)
Neutrophils Relative %: 72 %
Platelet Count: 313 10*3/uL (ref 150–400)
RBC: 4.88 MIL/uL (ref 4.22–5.81)
RDW: 13.5 % (ref 11.5–15.5)
WBC Count: 10.8 10*3/uL — ABNORMAL HIGH (ref 4.0–10.5)
nRBC: 0 % (ref 0.0–0.2)

## 2022-05-22 LAB — IRON AND IRON BINDING CAPACITY (CC-WL,HP ONLY)
Iron: 26 ug/dL — ABNORMAL LOW (ref 45–182)
Saturation Ratios: 5 % — ABNORMAL LOW (ref 17.9–39.5)
TIBC: 570 ug/dL — ABNORMAL HIGH (ref 250–450)
UIBC: 544 ug/dL — ABNORMAL HIGH (ref 117–376)

## 2022-05-22 LAB — FERRITIN: Ferritin: 6 ng/mL — ABNORMAL LOW (ref 24–336)

## 2022-05-22 LAB — CMP (CANCER CENTER ONLY)
ALT: 14 U/L (ref 0–44)
AST: 15 U/L (ref 15–41)
Albumin: 4.2 g/dL (ref 3.5–5.0)
Alkaline Phosphatase: 52 U/L (ref 38–126)
Anion gap: 3 — ABNORMAL LOW (ref 5–15)
BUN: 12 mg/dL (ref 6–20)
CO2: 29 mmol/L (ref 22–32)
Calcium: 8.7 mg/dL — ABNORMAL LOW (ref 8.9–10.3)
Chloride: 105 mmol/L (ref 98–111)
Creatinine: 0.82 mg/dL (ref 0.61–1.24)
GFR, Estimated: >60 mL/min (ref >60)
Glucose, Bld: 118 mg/dL — ABNORMAL HIGH (ref 70–99)
Potassium: 4 mmol/L (ref 3.5–5.1)
Sodium: 137 mmol/L (ref 135–145)
Total Bilirubin: 0.4 mg/dL (ref 0.3–1.2)
Total Protein: 6.9 g/dL (ref 6.5–8.1)

## 2022-05-22 NOTE — Progress Notes (Signed)
Reviewed labs with patient who stated he is feeling good. Pt labs does not meet parameters for phlebotomy. Pt given copy of labs and next appointment reviewed. Pt stated he was happy to not need a phlebotomy. Pt left ambulatory with his wife in no apparent distress.

## 2022-06-16 ENCOUNTER — Encounter: Payer: Self-pay | Admitting: Family Medicine

## 2022-06-16 DIAGNOSIS — R0602 Shortness of breath: Secondary | ICD-10-CM

## 2022-06-16 MED ORDER — ALBUTEROL SULFATE HFA 108 (90 BASE) MCG/ACT IN AERS: 1.0000 | INHALATION_SPRAY | Freq: Four times a day (QID) | RESPIRATORY_TRACT | 5 refills | Status: DC | PRN

## 2022-06-23 ENCOUNTER — Other Ambulatory Visit: Payer: Self-pay

## 2022-06-23 ENCOUNTER — Ambulatory Visit: Payer: Self-pay | Admitting: Family

## 2022-06-25 ENCOUNTER — Encounter: Payer: Self-pay | Admitting: Family Medicine

## 2022-06-25 ENCOUNTER — Telehealth: Payer: Self-pay | Admitting: Family Medicine

## 2022-06-25 DIAGNOSIS — F411 Generalized anxiety disorder: Secondary | ICD-10-CM

## 2022-06-25 MED ORDER — ALPRAZOLAM 1 MG PO TABS: ORAL_TABLET | ORAL | 2 refills | Status: DC

## 2022-06-25 NOTE — Telephone Encounter (Signed)
Prescription Request  06/25/2022  Is this a "Controlled Substance" medicine? Yes  LOV: 04/08/2022  What is the name of the medication or equipment?  ALPRAZolam Prudy Feeler) 1 MG tablet   Have you contacted your pharmacy to request a refill? Yes   Which pharmacy would you like this sent to?  [id number] [place], [place] - [address] [address] Phone: [phone] Fax: [phone]      Patient notified that their request is being sent to the clinical staff for review and that they should receive a response within 2 business days.   Please advise at [facility] [phone]

## 2022-06-25 NOTE — Telephone Encounter (Signed)
Okay for refill?  

## 2022-06-26 ENCOUNTER — Other Ambulatory Visit: Payer: Self-pay

## 2022-06-26 ENCOUNTER — Inpatient Hospital Stay: Payer: Self-pay

## 2022-06-26 ENCOUNTER — Encounter: Payer: Self-pay | Admitting: Family

## 2022-06-26 ENCOUNTER — Inpatient Hospital Stay: Payer: Self-pay | Attending: Hematology & Oncology

## 2022-06-26 ENCOUNTER — Inpatient Hospital Stay (HOSPITAL_BASED_OUTPATIENT_CLINIC_OR_DEPARTMENT_OTHER): Payer: Self-pay | Admitting: Family

## 2022-06-26 VITALS — BP 157/99 | HR 66 | Temp 98.7°F | Resp 18 | Ht 77.0 in | Wt 206.1 lb

## 2022-06-26 DIAGNOSIS — D500 Iron deficiency anemia secondary to blood loss (chronic): Secondary | ICD-10-CM

## 2022-06-26 DIAGNOSIS — G510 Bell's palsy: Secondary | ICD-10-CM | POA: Insufficient documentation

## 2022-06-26 DIAGNOSIS — D751 Secondary polycythemia: Secondary | ICD-10-CM | POA: Insufficient documentation

## 2022-06-26 DIAGNOSIS — F17210 Nicotine dependence, cigarettes, uncomplicated: Secondary | ICD-10-CM | POA: Insufficient documentation

## 2022-06-26 LAB — FERRITIN: Ferritin: 5 ng/mL — ABNORMAL LOW (ref 24–336)

## 2022-06-26 LAB — CMP (CANCER CENTER ONLY)
ALT: 18 U/L (ref 0–44)
AST: 21 U/L (ref 15–41)
Albumin: 4.6 g/dL (ref 3.5–5.0)
Alkaline Phosphatase: 53 U/L (ref 38–126)
Anion gap: 4 — ABNORMAL LOW (ref 5–15)
BUN: 11 mg/dL (ref 6–20)
CO2: 28 mmol/L (ref 22–32)
Calcium: 9.4 mg/dL (ref 8.9–10.3)
Chloride: 102 mmol/L (ref 98–111)
Creatinine: 0.94 mg/dL (ref 0.61–1.24)
GFR, Estimated: >60 mL/min (ref >60)
Glucose, Bld: 102 mg/dL — ABNORMAL HIGH (ref 70–99)
Potassium: 4.6 mmol/L (ref 3.5–5.1)
Sodium: 134 mmol/L — ABNORMAL LOW (ref 135–145)
Total Bilirubin: 0.4 mg/dL (ref 0.3–1.2)
Total Protein: 7.6 g/dL (ref 6.5–8.1)

## 2022-06-26 LAB — CBC WITH DIFFERENTIAL (CANCER CENTER ONLY)
Abs Immature Granulocytes: 0.06 10*3/uL (ref 0.00–0.07)
Basophils Absolute: 0.1 10*3/uL (ref 0.0–0.1)
Basophils Relative: 1 %
Eosinophils Absolute: 0 10*3/uL (ref 0.0–0.5)
Eosinophils Relative: 0 %
HCT: 42.9 % (ref 39.0–52.0)
Hemoglobin: 13.9 g/dL (ref 13.0–17.0)
Immature Granulocytes: 0 %
Lymphocytes Relative: 12 %
Lymphs Abs: 1.9 10*3/uL (ref 0.7–4.0)
MCH: 26.9 pg (ref 26.0–34.0)
MCHC: 32.4 g/dL (ref 30.0–36.0)
MCV: 83 fL (ref 80.0–100.0)
Monocytes Absolute: 1 10*3/uL (ref 0.1–1.0)
Monocytes Relative: 7 %
Neutro Abs: 12.4 10*3/uL — ABNORMAL HIGH (ref 1.7–7.7)
Neutrophils Relative %: 80 %
Platelet Count: 298 10*3/uL (ref 150–400)
RBC: 5.17 MIL/uL (ref 4.22–5.81)
RDW: 15.9 % — ABNORMAL HIGH (ref 11.5–15.5)
WBC Count: 15.5 10*3/uL — ABNORMAL HIGH (ref 4.0–10.5)
nRBC: 0 % (ref 0.0–0.2)

## 2022-06-26 LAB — IRON AND IRON BINDING CAPACITY (CC-WL,HP ONLY)
Iron: 20 ug/dL — ABNORMAL LOW (ref 45–182)
Saturation Ratios: 3 % — ABNORMAL LOW (ref 17.9–39.5)
TIBC: 615 ug/dL — ABNORMAL HIGH (ref 250–450)
UIBC: 595 ug/dL — ABNORMAL HIGH (ref 117–376)

## 2022-06-26 NOTE — Progress Notes (Signed)
Hematology and Oncology Follow Up Visit  Stuart Hill [id number] 1970/09/06 52 y.o. 06/26/2022   Principle Diagnosis:  Secondary polycythemia JAK2 (-), smoker  LEFT Bell's palsy   Current Therapy:        Phlebotomy to maintain hematocrit below 45% 2 baby aspirin daily at bedtime - not taking per patient   Interim History:  Stuart Hill is here today for follow-up. Hct is 42%. He is under a good amount of stress at home and with work. He didn't start taking his Cozaar and HTN persists. He has had episodes of headaches, hearing heart beat in his ears and blurry vision.  He is smoking 2 packs of cigarettes daily as well as 1-2 bottles of liquor.  He admits that he needs to cut back and eat healthier. We also discussed the Importance of taking his Cozaar as prescribed and he will start.  No fever, chills, n/v, cough, rash, dizziness, SOB, chest pain, palpitations, abdominal pain or changes in bowel or bladder habits.  No swelling in his extremities.  No falls or syncope reported.  Appetite and hydration good. Weight is stable at 206 lbs.   ECOG Performance Status: 1 - Symptomatic but completely ambulatory  Medications:  Allergies as of 06/26/2022       Reactions   Plavix [clopidogrel Bisulfate] Other (See Comments)   Confusion, lethargy, slurred speech   Afrin [nasal Spray] Hives   Antihistamines, Chlorpheniramine-type Hives   Benadryl [diphenhydramine Hcl] Hives   Buprenorphine Hcl Hives   Carisoprodol Hives   Codeine Hives   Metoclopramide Hives   Morphine And Codeine Hives   Oxymetazoline Hives   Pheniramine Hives, Rash        Medication List        Accurate as of Jun 26, 2022  1:45 PM. If you have any questions, ask your nurse or doctor.          albuterol 108 (90 Base) MCG/ACT inhaler Commonly known as: VENTOLIN HFA Inhale 1-2 puffs into the lungs every 6 (six) hours as needed for wheezing or shortness of breath.   ALPRAZolam 1 MG tablet Commonly known  as: XANAX TAKE 1 TABLET BY MOUTH THREE TIMES A DAY AS NEEDED FOR ANXIETY   aspirin EC 81 MG tablet Take 162 mg by mouth at bedtime. Swallow whole.   losartan 25 MG tablet Commonly known as: COZAAR TAKE 1 TABLET (25 MG TOTAL) BY MOUTH DAILY.   nitroGLYCERIN 0.4 MG SL tablet Commonly known as: NITROSTAT Place 1 tablet (0.4 mg total) under the tongue every 5 (five) minutes as needed for chest pain.   phenazopyridine 200 MG tablet Commonly known as: PYRIDIUM Take 1 tablet (200 mg total) by mouth 3 (three) times daily.   rosuvastatin 10 MG tablet Commonly known as: CRESTOR Take 1 tablet (10 mg total) by mouth at bedtime.   sildenafil 20 MG tablet Commonly known as: REVATIO Take 20- 60 mg po prior to sexual activity as needed   traMADol 50 MG tablet Commonly known as: ULTRAM TAKE 1 TABLET (50 MG TOTAL) BY MOUTH EVERY 8 (EIGHT) HOURS AS NEEDED FOR UP TO 5 DAYS.   VISINE OP Apply 1 drop to eye as needed (itchy, dryness).        Allergies:  Allergies  Allergen Reactions   Plavix [Clopidogrel Bisulfate] Other (See Comments)    Confusion, lethargy, slurred speech   Afrin [Nasal Spray] Hives   Antihistamines, Chlorpheniramine-Type Hives   Benadryl [Diphenhydramine Hcl] Hives   Buprenorphine Hcl Hives  Carisoprodol Hives   Codeine Hives   Metoclopramide Hives   Morphine And Codeine Hives   Oxymetazoline Hives   Pheniramine Hives and Rash    Past Medical History, Surgical history, Social history, and Family History were reviewed and updated.  Review of Systems: All other 10 point review of systems is negative.   Physical Exam:  vitals were not taken for this visit.   Wt Readings from Last 3 Encounters:  04/22/22 198 lb (89.8 kg)  04/08/22 198 lb 6.4 oz (90 kg)  04/05/22 199 lb (90.3 kg)    Ocular: Sclerae unicteric, pupils equal, round and reactive to light Ear-nose-throat: Oropharynx clear, dentition fair Lymphatic: No cervical or supraclavicular  adenopathy Lungs no rales or rhonchi, good excursion bilaterally Heart regular rate and rhythm, no murmur appreciated Abd soft, nontender, positive bowel sounds MSK no focal spinal tenderness, no joint edema Neuro: non-focal, well-oriented, appropriate affect Breasts: Deferred   Lab Results  Component Value Date   WBC 15.5 (H) 06/26/2022   HGB 13.9 06/26/2022   HCT 42.9 06/26/2022   MCV 83.0 06/26/2022   PLT 298 06/26/2022   Lab Results  Component Value Date   FERRITIN 6 (L) 05/22/2022   IRON 26 (L) 05/22/2022   TIBC 570 (H) 05/22/2022   UIBC 544 (H) 05/22/2022   IRONPCTSAT 5 (L) 05/22/2022   Lab Results  Component Value Date   RETICCTPCT 1.8 04/22/2022   RBC 5.17 06/26/2022   No results found for: "KPAFRELGTCHN", "LAMBDASER", "KAPLAMBRATIO" No results found for: "IGGSERUM", "IGA", "IGMSERUM" No results found for: "TOTALPROTELP", "ALBUMINELP", "A1GS", "A2GS", "BETS", "BETA2SER", "GAMS", "MSPIKE", "SPEI"   Chemistry      Component Value Date/Time   NA 137 05/22/2022 1236   NA 143 01/01/2017 1453   NA 139 11/12/2015 0945   K 4.0 05/22/2022 1236   K 4.3 01/01/2017 1453   K 3.7 11/12/2015 0945   CL 105 05/22/2022 1236   CL 103 01/01/2017 1453   CO2 29 05/22/2022 1236   CO2 27 01/01/2017 1453   CO2 22 11/12/2015 0945   BUN 12 05/22/2022 1236   BUN 18 01/01/2017 1453   BUN 13.1 11/12/2015 0945   CREATININE 0.82 05/22/2022 1236   CREATININE 0.96 10/05/2019 1042   CREATININE 0.9 11/12/2015 0945      Component Value Date/Time   CALCIUM 8.7 (L) 05/22/2022 1236   CALCIUM 8.8 01/01/2017 1453   CALCIUM 8.9 11/12/2015 0945   ALKPHOS 52 05/22/2022 1236   ALKPHOS 44 01/01/2017 1453   ALKPHOS 66 11/12/2015 0945   AST 15 05/22/2022 1236   AST 13 11/12/2015 0945   ALT 14 05/22/2022 1236   ALT 368 (HH) 01/01/2017 1453   ALT 14 11/12/2015 0945   BILITOT 0.4 05/22/2022 1236   BILITOT <0.22 11/12/2015 0945       Impression and Plan:  Stuart Hill is a 52 yo caucasian  male with history of secondary polycythemia, JAK2 negative, smoker.   No phlebotomy needed at this time.  Follow-up in 2 months.   Eileen Stanford, NP 5/31/20241:45 PM

## 2022-07-21 NOTE — Progress Notes (Unsigned)
[facility] [address], [address] [place], [place] [address] [phone]  Date:  07/23/2022   Name:  Stuart Hill   DOB:  Mar 05, 1970   MRN:  [id number]  PCP:  Pearline Cables, MD    Chief Complaint: copd/ congestion (Pt states he has been coughing up a lot of mucus. He says sometimes he coughs up blood./Concerns/ questions: pain in the back X 4-5 months will not go away worse when coughing.  /)   History of Present Illness:  Stuart Hill is a 52 y.o. very pleasant male patient who presents with the following:  Pt seen today with COPD symptoms- history of COPD, hyperlipidemia, polycythemia vera, dilation of the aorta, CAD, anxiety  Last visit with myself was in March- from that visit  Patient seen today with a spectrum of symptoms including abdominal distention, change in bowel habits, abdominal discomfort, and a sensation of "vibration" in his penis. Advised I do not have a definite explanation for his symptoms as of yet. We wonder if he does have C. difficile after all, his wife was recently infected. We will retest him for C. difficile. I will also obtain a urine culture and PSA due to genital symptoms. I have asked him to please keep you posted of any change or worsening of his symptoms in the meantime   Seen by hematology last month to follow-up: secondary polycythemia, JAK2 negative, smoker.   No phlebotomy needed at this time.   "I am not sure if I just need to see my lung doctor" He notes he may have some coughing spells and bring up a lot of mucus  This is more of an exacerbation of chronic symptoms as opposed to a new issue Most recent chest film done in March  He will get back pain that is worse when he coughs He is not sure if he might have hurt a rib from coughing  He may cough up some blood as well in his mucus which alarmed him  Admits he has been feeling very stressed, his mom is sick and there are some  stressors at work.  He notes he has been smoking up to 2 packs a day, he states he will drink caffeine in the morning, no water all day and then consume 2 bottles of wine at night  Patient Active Problem List   Diagnosis Date Noted   MDD (major depressive disorder), recurrent severe, without psychosis (HCC) 10/01/2021   Paroxysmal SVT (supraventricular tachycardia)    Emphysema of lung (HCC)    Palpitations 01/09/2021   Minimal CAD 01/09/2021   Mixed hyperlipidemia 01/09/2021   Ascending aorta dilatation (HCC) 01/09/2021   Allergy 08/19/2020   Anxiety 08/19/2020   Arthritis 08/19/2020   Bell's palsy 08/19/2020   Chest pain 07/26/2020   Family history of early CAD 07/26/2020   DOE (dyspnea on exertion) 12/14/2019   Angina pectoris (HCC) 11/03/2019   Tobacco abuse 11/03/2019   Other hyperlipidemia 11/03/2019   Tonsillar hypertrophy 04/30/2017   Obstructive sleep apnea 04/23/2017   Polycythemia, secondary 09/29/2016   Polycythemia vera (HCC) 05/03/2015   Hemoptysis 04/03/2015   Condylomata acuminata 01/18/2015   Generalized anxiety disorder 03/05/2014    Past Medical History:  Diagnosis Date   Allergy    Anxiety    Arthritis    Bell's palsy    COPD (chronic obstructive pulmonary disease) (HCC)    Erythrocytosis 04/10/2015   Hyperlipidemia  Polycythemia vera (HCC) 05/03/2015    Past Surgical History:  Procedure Laterality Date   DENTAL SURGERY     HAND SURGERY     TONSILLECTOMY Bilateral 04/30/2017   Procedure: TONSILLECTOMY;  Surgeon: Osborn Coho, MD;  Location: [facility];  Service: ENT;  Laterality: Bilateral;    Social History   Tobacco Use   Smoking status: Every Day    Packs/day: 2.00    Years: 24.00    Additional pack years: 0.00    Total pack years: 48.00    Types: Cigarettes   Smokeless tobacco: Never   Tobacco comments:    He is smoking 2 PPD.  Vaping Use   Vaping Use: Never used  Substance Use Topics   Alcohol use: Yes     Alcohol/week: 1.0 standard drink of alcohol    Types: 1 Cans of beer per week    Comment: 6-10 beer a week   Drug use: Yes    Types: Other-see comments, Marijuana    Comment: Marijuana every other day.    Family History  Problem Relation Age of Onset   COPD Mother    Fibromyalgia Mother    Heart disease Father    Stroke Father    Depression Brother    Heart disease Brother    Heart disease Maternal Grandmother    Stroke Maternal Grandmother    Heart disease Maternal Grandfather    Stroke Maternal Grandfather    Heart disease Paternal Grandmother    Stroke Paternal Grandmother    Heart disease Paternal Grandfather    Stroke Paternal Grandfather    Stroke Paternal Aunt     Allergies  Allergen Reactions   Plavix [Clopidogrel Bisulfate] Other (See Comments)    Confusion, lethargy, slurred speech   Afrin [Nasal Spray] Hives   Antihistamines, Chlorpheniramine-Type Hives   Benadryl [Diphenhydramine Hcl] Hives   Buprenorphine Hcl Hives   Carisoprodol Hives   Codeine Hives   Metoclopramide Hives   Morphine And Codeine Hives   Oxymetazoline Hives   Pheniramine Hives and Rash    Medication list has been reviewed and updated.  Current Outpatient Medications on File Prior to Visit  Medication Sig Dispense Refill   albuterol (VENTOLIN HFA) 108 (90 Base) MCG/ACT inhaler Inhale 1-2 puffs into the lungs every 6 (six) hours as needed for wheezing or shortness of breath. 18 g 5   ALPRAZolam (XANAX) 1 MG tablet TAKE 1 TABLET BY MOUTH THREE TIMES A DAY AS NEEDED FOR ANXIETY 90 tablet 2   aspirin EC 81 MG tablet Take 162 mg by mouth at bedtime. Swallow whole.     losartan (COZAAR) 25 MG tablet TAKE 1 TABLET (25 MG TOTAL) BY MOUTH DAILY. 90 tablet 2   nitroGLYCERIN (NITROSTAT) 0.4 MG SL tablet Place 1 tablet (0.4 mg total) under the tongue every 5 (five) minutes as needed for chest pain. 25 tablet 6   sildenafil (REVATIO) 20 MG tablet Take 20- 60 mg po prior to sexual activity as needed  30 tablet 5   Tetrahydrozoline HCl (VISINE OP) Apply 1 drop to eye as needed (itchy, dryness).     traMADol (ULTRAM) 50 MG tablet TAKE 1 TABLET (50 MG TOTAL) BY MOUTH EVERY 8 (EIGHT) HOURS AS NEEDED FOR UP TO 5 DAYS. 15 tablet 0   No current facility-administered medications on file prior to visit.    Review of Systems:  As per HPI- otherwise negative.   Physical Examination: Vitals:   07/23/22 1341  BP: (!) 150/82  Pulse: 72  Resp: 18  Temp: 98.2 F (36.8 C)  SpO2: 96%   Vitals:   07/23/22 1341  Weight: 209 lb 12.8 oz (95.2 kg)  Height: 6\' 5"  (1.956 m)   Body mass index is 24.88 kg/m. Ideal Body Weight: Weight in (lb) to have BMI = 25: 210.4  GEN: no acute distress. Looks well and his normal self HEENT: Atraumatic, Normocephalic.  Ears and Nose: No external deformity. CV: RRR, No M/G/R. No JVD. No thrill. No extra heart sounds. PULM: CTA B, no wheezes, crackles, rhonchi. No retractions. No resp. distress. No accessory muscle use. ABD: S, NT, ND, +BS. No rebound. No HSM. EXTR: No c/c/e PSYCH: Normally interactive. Conversant.    Assessment and Plan: Acute bronchitis with COPD (HCC) - Plan: DG Chest 2 View, ipratropium-albuterol (DUONEB) 0.5-2.5 (3) MG/3ML SOLN, doxycycline (VIBRAMYCIN) 100 MG capsule, predniSONE (DELTASONE) 20 MG tablet  SOB (shortness of breath) - Plan: albuterol (VENTOLIN HFA) 108 (90 Base) MCG/ACT inhaler  Generalized anxiety disorder  Smoking  Alcohol abuse  Patient seen today for follow-up He has been coughing more, having more wheezing and shortness of breath when working outdoors in the heat.  Admits she has been smoking more, up to 2 packs a day  Suspect COPD exacerbation. Spiriva is too expensive, prescribed DuoNeb, they already have a nebulizer machine Also prednisone, doxycycline Will obtain a chest x-ray  Encouraged him to cut back or stop drinking, advised this will typically make his anxiety worse in the end.  Also encouraged  him to cut back on smoking which is very hard on his lungs, and to try to hydrate during the day.  Signed Abbe Amsterdam, MD  Received chest film, message to patient  DG Chest 2 View  Result Date: 07/23/2022 CLINICAL DATA:  Productive cough for the past 6 months. EXAM: CHEST - 2 VIEW COMPARISON:  Chest x-ray dated April 05, 2022. FINDINGS: The heart size and mediastinal contours are within normal limits. Both lungs are clear. The visualized skeletal structures are unremarkable. IMPRESSION: No active cardiopulmonary disease. Electronically Signed   By: Obie Dredge M.D.   On: 07/23/2022 14:57

## 2022-07-23 ENCOUNTER — Ambulatory Visit (HOSPITAL_BASED_OUTPATIENT_CLINIC_OR_DEPARTMENT_OTHER)
Admission: RE | Admit: 2022-07-23 | Discharge: 2022-07-23 | Disposition: A | Discharge status: Home / Self Care | Payer: Self-pay | Source: Ambulatory Visit | Attending: Family Medicine | Admitting: Family Medicine

## 2022-07-23 ENCOUNTER — Ambulatory Visit (INDEPENDENT_AMBULATORY_CARE_PROVIDER_SITE_OTHER): Payer: Self-pay | Admitting: Family Medicine

## 2022-07-23 ENCOUNTER — Encounter: Payer: Self-pay | Admitting: Family Medicine

## 2022-07-23 VITALS — BP 150/82 | HR 72 | Temp 98.2°F | Resp 18 | Ht 77.0 in | Wt 209.8 lb

## 2022-07-23 DIAGNOSIS — J440 Chronic obstructive pulmonary disease with acute lower respiratory infection: Secondary | ICD-10-CM

## 2022-07-23 DIAGNOSIS — F1010 Alcohol abuse, uncomplicated: Secondary | ICD-10-CM

## 2022-07-23 DIAGNOSIS — R0602 Shortness of breath: Secondary | ICD-10-CM

## 2022-07-23 DIAGNOSIS — F411 Generalized anxiety disorder: Secondary | ICD-10-CM

## 2022-07-23 DIAGNOSIS — J209 Acute bronchitis, unspecified: Secondary | ICD-10-CM

## 2022-07-23 DIAGNOSIS — F17200 Nicotine dependence, unspecified, uncomplicated: Secondary | ICD-10-CM

## 2022-07-23 MED ORDER — PREDNISONE 20 MG PO TABS
ORAL_TABLET | ORAL | 0 refills | Status: DC
Start: 2022-07-23 — End: 2022-11-12

## 2022-07-23 MED ORDER — IPRATROPIUM-ALBUTEROL 0.5-2.5 (3) MG/3ML IN SOLN
3.0000 mL | Freq: Two times a day (BID) | RESPIRATORY_TRACT | 2 refills | Status: DC
Start: 2022-07-23 — End: 2023-05-31

## 2022-07-23 MED ORDER — ALBUTEROL SULFATE HFA 108 (90 BASE) MCG/ACT IN AERS
1.0000 | INHALATION_SPRAY | Freq: Four times a day (QID) | RESPIRATORY_TRACT | 5 refills | Status: AC | PRN
Start: 2022-07-23 — End: ?

## 2022-07-23 MED ORDER — DOXYCYCLINE HYCLATE 100 MG PO CAPS
100.0000 mg | ORAL_CAPSULE | Freq: Two times a day (BID) | ORAL | 0 refills | Status: DC
Start: 2022-07-23 — End: 2023-05-05

## 2022-07-23 NOTE — Patient Instructions (Addendum)
It was good to see you today- I will be in touch with your chest film asap  Work on drinking less alcohol, more fluids during the day, and less smoking- I think you will feel better!    Please keep me posted

## 2022-08-26 ENCOUNTER — Encounter: Payer: Self-pay | Admitting: Family

## 2022-08-26 ENCOUNTER — Inpatient Hospital Stay (HOSPITAL_BASED_OUTPATIENT_CLINIC_OR_DEPARTMENT_OTHER): Payer: Self-pay | Admitting: Family

## 2022-08-26 ENCOUNTER — Inpatient Hospital Stay: Payer: Self-pay

## 2022-08-26 ENCOUNTER — Inpatient Hospital Stay: Payer: Self-pay | Attending: Hematology & Oncology

## 2022-08-26 VITALS — BP 134/83 | HR 66 | Temp 98.2°F | Resp 17 | Wt 208.1 lb

## 2022-08-26 VITALS — BP 142/96 | HR 65

## 2022-08-26 DIAGNOSIS — D751 Secondary polycythemia: Secondary | ICD-10-CM

## 2022-08-26 DIAGNOSIS — D500 Iron deficiency anemia secondary to blood loss (chronic): Secondary | ICD-10-CM

## 2022-08-26 DIAGNOSIS — F17200 Nicotine dependence, unspecified, uncomplicated: Secondary | ICD-10-CM | POA: Insufficient documentation

## 2022-08-26 LAB — CMP (CANCER CENTER ONLY)
ALT: 23 U/L (ref 0–44)
AST: 20 U/L (ref 15–41)
Albumin: 4.5 g/dL (ref 3.5–5.0)
Alkaline Phosphatase: 57 U/L (ref 38–126)
Anion gap: 7 (ref 5–15)
BUN: 10 mg/dL (ref 6–20)
CO2: 28 mmol/L (ref 22–32)
Calcium: 9.2 mg/dL (ref 8.9–10.3)
Chloride: 102 mmol/L (ref 98–111)
Creatinine: 0.94 mg/dL (ref 0.61–1.24)
GFR, Estimated: >60 mL/min (ref >60)
Glucose, Bld: 100 mg/dL — ABNORMAL HIGH (ref 70–99)
Potassium: 4.4 mmol/L (ref 3.5–5.1)
Sodium: 137 mmol/L (ref 135–145)
Total Bilirubin: 0.5 mg/dL (ref 0.3–1.2)
Total Protein: 7.8 g/dL (ref 6.5–8.1)

## 2022-08-26 LAB — CBC WITH DIFFERENTIAL (CANCER CENTER ONLY)
Abs Immature Granulocytes: 0.04 10*3/uL (ref 0.00–0.07)
Basophils Absolute: 0.1 10*3/uL (ref 0.0–0.1)
Basophils Relative: 1 %
Eosinophils Absolute: 0.1 10*3/uL (ref 0.0–0.5)
Eosinophils Relative: 1 %
HCT: 48 % (ref 39.0–52.0)
Hemoglobin: 15.8 g/dL (ref 13.0–17.0)
Immature Granulocytes: 0 %
Lymphocytes Relative: 18 %
Lymphs Abs: 2 10*3/uL (ref 0.7–4.0)
MCH: 27.6 pg (ref 26.0–34.0)
MCHC: 32.9 g/dL (ref 30.0–36.0)
MCV: 83.8 fL (ref 80.0–100.0)
Monocytes Absolute: 0.8 10*3/uL (ref 0.1–1.0)
Monocytes Relative: 7 %
Neutro Abs: 7.9 10*3/uL — ABNORMAL HIGH (ref 1.7–7.7)
Neutrophils Relative %: 73 %
Platelet Count: 282 10*3/uL (ref 150–400)
RBC: 5.73 MIL/uL (ref 4.22–5.81)
RDW: 19.9 % — ABNORMAL HIGH (ref 11.5–15.5)
WBC Count: 10.9 10*3/uL — ABNORMAL HIGH (ref 4.0–10.5)
nRBC: 0 % (ref 0.0–0.2)

## 2022-08-26 LAB — IRON AND IRON BINDING CAPACITY (CC-WL,HP ONLY)
Iron: 30 ug/dL — ABNORMAL LOW (ref 45–182)
Saturation Ratios: 5 % — ABNORMAL LOW (ref 17.9–39.5)
TIBC: 594 ug/dL — ABNORMAL HIGH (ref 250–450)
UIBC: 564 ug/dL — ABNORMAL HIGH (ref 117–376)

## 2022-08-26 LAB — FERRITIN: Ferritin: 7 ng/mL — ABNORMAL LOW (ref 24–336)

## 2022-08-26 NOTE — Progress Notes (Signed)
Stuart Hill presents today for phlebotomy per MD orders. Phlebotomy procedure started at 1409 and ended at 1422. 534 grams removed from rt [facility] using 16g phlebotomy kit by Berenice Primas, RN Patient tolerated procedure well. IV needle removed intact. Pt declined post observation period.

## 2022-08-26 NOTE — Progress Notes (Signed)
Opened in error

## 2022-08-26 NOTE — Progress Notes (Signed)
Hematology and Oncology Follow Up Visit  ARTH Hill [id number] August 01, 1970 52 y.o. 08/26/2022   Principle Diagnosis:   Secondary polycythemia JAK2 (-), smoker  LEFT Bell's palsy   Current Therapy:        Phlebotomy to maintain hematocrit below 45% 2 baby aspirin daily at bedtime - not taking per patient   Interim History:  Stuart Hill is here today for follow-up and phlebotomy. He is feeling fatigued. He has occasional SOB and palpitations in the morning and when going to bed.  He states that since having Bell's Palsy the left side of his jaw will lock up at times.  No bruising or petechiae.  No fever, chills, n/v, cough, rash, dizziness, SOB, chest pain, palpitations, abdominal pain or changes in bowel or bladder habits.  No swelling, tenderness, numbness or tingling in his extremities.  No falls or syncope.  Appetite is fair and he is trying drink more water. He states that he does drink quite a bit of wine daily.  Still smoking daily.  Weight is stable at 208 lbs.   ECOG Performance Status: 1 - Symptomatic but completely ambulatory  Medications:  Allergies as of 08/26/2022       Reactions   Plavix [clopidogrel Bisulfate] Other (See Comments)   Confusion, lethargy, slurred speech   Afrin [nasal Spray] Hives   Antihistamines, Chlorpheniramine-type Hives   Benadryl [diphenhydramine Hcl] Hives   Buprenorphine Hcl Hives   Carisoprodol Hives   Codeine Hives   Metoclopramide Hives   Morphine And Codeine Hives   Oxymetazoline Hives   Pheniramine Hives, Rash        Medication List        Accurate as of August 26, 2022  1:57 PM. If you have any questions, ask your nurse or doctor.          albuterol 108 (90 Base) MCG/ACT inhaler Commonly known as: VENTOLIN HFA Inhale 1-2 puffs into the lungs every 6 (six) hours as needed for wheezing or shortness of breath.   ALPRAZolam 1 MG tablet Commonly known as: XANAX TAKE 1 TABLET BY MOUTH THREE TIMES A DAY AS NEEDED  FOR ANXIETY   aspirin EC 81 MG tablet Take 162 mg by mouth at bedtime. Swallow whole.   doxycycline 100 MG capsule Commonly known as: VIBRAMYCIN Take 1 capsule (100 mg total) by mouth 2 (two) times daily.   ipratropium-albuterol 0.5-2.5 (3) MG/3ML Soln Commonly known as: DUONEB Take 3 mLs by nebulization 2 (two) times daily.   losartan 25 MG tablet Commonly known as: COZAAR TAKE 1 TABLET (25 MG TOTAL) BY MOUTH DAILY.   nitroGLYCERIN 0.4 MG SL tablet Commonly known as: NITROSTAT Place 1 tablet (0.4 mg total) under the tongue every 5 (five) minutes as needed for chest pain.   predniSONE 20 MG tablet Commonly known as: DELTASONE Take 40 mg by mouth daily for 3 days, then 20 mg by mouth daily for 3 days   sildenafil 20 MG tablet Commonly known as: REVATIO Take 20- 60 mg po prior to sexual activity as needed   traMADol 50 MG tablet Commonly known as: ULTRAM TAKE 1 TABLET (50 MG TOTAL) BY MOUTH EVERY 8 (EIGHT) HOURS AS NEEDED FOR UP TO 5 DAYS.   VISINE OP Apply 1 drop to eye as needed (itchy, dryness).        Allergies:  Allergies  Allergen Reactions   Plavix [Clopidogrel Bisulfate] Other (See Comments)    Confusion, lethargy, slurred speech   Afrin [Nasal Spray] Hives  Antihistamines, Chlorpheniramine-Type Hives   Benadryl [Diphenhydramine Hcl] Hives   Buprenorphine Hcl Hives   Carisoprodol Hives   Codeine Hives   Metoclopramide Hives   Morphine And Codeine Hives   Oxymetazoline Hives   Pheniramine Hives and Rash    Past Medical History, Surgical history, Social history, and Family History were reviewed and updated.  Review of Systems: All other 10 point review of systems is negative.   Physical Exam:  weight is 208 lb 1.9 oz (94.4 kg). His oral temperature is 98.2 F (36.8 C). His blood pressure is 134/83 and his pulse is 66. His respiration is 17 and oxygen saturation is 99%.   Wt Readings from Last 3 Encounters:  08/26/22 208 lb 1.9 oz (94.4 kg)   07/23/22 209 lb 12.8 oz (95.2 kg)  06/26/22 206 lb 1.9 oz (93.5 kg)    Ocular: Sclerae unicteric, pupils equal, round and reactive to light Ear-nose-throat: Oropharynx clear, dentition fair Lymphatic: No cervical or supraclavicular adenopathy Lungs no rales or rhonchi, good excursion bilaterally Heart regular rate and rhythm, no murmur appreciated Abd soft, nontender, positive bowel sounds MSK no focal spinal tenderness, no joint edema Neuro: non-focal, Hill-oriented, appropriate affect Breasts: Deferred   Lab Results  Component Value Date   WBC 10.9 (H) 08/26/2022   HGB 15.8 08/26/2022   HCT 48.0 08/26/2022   MCV 83.8 08/26/2022   PLT 282 08/26/2022   Lab Results  Component Value Date   FERRITIN 5 (L) 06/26/2022   IRON 20 (L) 06/26/2022   TIBC 615 (H) 06/26/2022   UIBC 595 (H) 06/26/2022   IRONPCTSAT 3 (L) 06/26/2022   Lab Results  Component Value Date   RETICCTPCT 1.8 04/22/2022   RBC 5.73 08/26/2022   No results found for: "KPAFRELGTCHN", "LAMBDASER", "KAPLAMBRATIO" No results found for: "IGGSERUM", "IGA", "IGMSERUM" No results found for: "TOTALPROTELP", "ALBUMINELP", "A1GS", "A2GS", "BETS", "BETA2SER", "GAMS", "MSPIKE", "SPEI"   Chemistry      Component Value Date/Time   NA 134 (L) 06/26/2022 1328   NA 143 01/01/2017 1453   NA 139 11/12/2015 0945   K 4.6 06/26/2022 1328   K 4.3 01/01/2017 1453   K 3.7 11/12/2015 0945   CL 102 06/26/2022 1328   CL 103 01/01/2017 1453   CO2 28 06/26/2022 1328   CO2 27 01/01/2017 1453   CO2 22 11/12/2015 0945   BUN 11 06/26/2022 1328   BUN 18 01/01/2017 1453   BUN 13.1 11/12/2015 0945   CREATININE 0.94 06/26/2022 1328   CREATININE 0.96 10/05/2019 1042   CREATININE 0.9 11/12/2015 0945      Component Value Date/Time   CALCIUM 9.4 06/26/2022 1328   CALCIUM 8.8 01/01/2017 1453   CALCIUM 8.9 11/12/2015 0945   ALKPHOS 53 06/26/2022 1328   ALKPHOS 44 01/01/2017 1453   ALKPHOS 66 11/12/2015 0945   AST 21 06/26/2022 1328    AST 13 11/12/2015 0945   ALT 18 06/26/2022 1328   ALT 368 (HH) 01/01/2017 1453   ALT 14 11/12/2015 0945   BILITOT 0.4 06/26/2022 1328   BILITOT <0.22 11/12/2015 0945       Impression and Plan: Stuart Hill is a 52 yo caucasian male with history of secondary polycythemia, JAK2 negative, smoker.   We will proceed with phlebotomy today for Hct 48%.  Follow-up in 2 months.   Eileen Stanford, NP 7/31/20241:57 PM

## 2022-08-26 NOTE — Patient Instructions (Signed)

## 2022-09-17 ENCOUNTER — Encounter: Payer: Self-pay | Admitting: Family Medicine

## 2022-09-17 ENCOUNTER — Other Ambulatory Visit: Payer: Self-pay | Admitting: Family Medicine

## 2022-09-17 DIAGNOSIS — F411 Generalized anxiety disorder: Secondary | ICD-10-CM

## 2022-09-17 MED ORDER — ALPRAZOLAM 1 MG PO TABS
ORAL_TABLET | ORAL | 2 refills | Status: DC
Start: 2022-09-17 — End: 2022-11-12

## 2022-10-21 ENCOUNTER — Encounter: Payer: Self-pay | Admitting: Family

## 2022-10-26 ENCOUNTER — Inpatient Hospital Stay: Payer: Self-pay | Attending: Hematology & Oncology

## 2022-10-26 ENCOUNTER — Inpatient Hospital Stay: Payer: Self-pay | Admitting: Hematology & Oncology

## 2022-10-26 ENCOUNTER — Encounter: Payer: Self-pay | Admitting: Hematology & Oncology

## 2022-10-26 ENCOUNTER — Inpatient Hospital Stay: Payer: Self-pay

## 2022-11-12 ENCOUNTER — Ambulatory Visit (INDEPENDENT_AMBULATORY_CARE_PROVIDER_SITE_OTHER): Payer: Self-pay | Admitting: Family Medicine

## 2022-11-12 VITALS — BP 130/62 | HR 73 | Temp 97.8°F | Resp 18 | Ht 77.0 in

## 2022-11-12 DIAGNOSIS — R042 Hemoptysis: Secondary | ICD-10-CM

## 2022-11-12 DIAGNOSIS — F411 Generalized anxiety disorder: Secondary | ICD-10-CM

## 2022-11-12 DIAGNOSIS — L509 Urticaria, unspecified: Secondary | ICD-10-CM

## 2022-11-12 DIAGNOSIS — F4329 Adjustment disorder with other symptoms: Secondary | ICD-10-CM

## 2022-11-12 MED ORDER — EPINEPHRINE 0.3 MG/0.3ML IJ SOAJ
0.3000 mg | INTRAMUSCULAR | 99 refills | Status: DC | PRN
Start: 2022-11-12 — End: 2023-05-31

## 2022-11-12 MED ORDER — PREDNISONE 20 MG PO TABS
ORAL_TABLET | ORAL | 0 refills | Status: DC
Start: 2022-11-12 — End: 2023-05-05

## 2022-11-12 MED ORDER — HYDROXYZINE PAMOATE 25 MG PO CAPS
25.0000 mg | ORAL_CAPSULE | Freq: Three times a day (TID) | ORAL | 1 refills | Status: DC | PRN
Start: 2022-11-12 — End: 2022-11-20

## 2022-11-12 MED ORDER — ALPRAZOLAM 1 MG PO TABS
ORAL_TABLET | ORAL | 2 refills | Status: DC
Start: 2022-11-12 — End: 2023-03-19

## 2022-11-12 NOTE — Patient Instructions (Signed)
Good to see you today, I am sorry things are so very hard right now!  Try hydroxyzine as needed for hives and itching Can also use prednisone for 6 days to help with hives- let me know if this is not working out for you I ordered a CT scan of your chest to evaluate the coughing up blood you have noticed

## 2022-11-12 NOTE — Progress Notes (Signed)
[facility] [address] [place], [place] [address] [phone]  Date:  11/12/2022   Name:  Stuart Hill   DOB:  January 04, 1971   MRN:  [id number]  PCP:  Pearline Cables, MD    Chief Complaint: Rash (Pt says he is spitting up blood, thinks it is all stress related. )   History of Present Illness:  Stuart Hill is a 52 y.o. very pleasant male patient who presents with the following:  Pt seen today with some recent episodes of hives  He notes a long history of hives when he is under stress, since he was a child. He first noted current episode of hives when his mom got really sick and his uncle committed suicide -this occurred about 3 weeks ago Her uncle was caring for his mom; not only did he lose his uncle, but his mom is now without her caregiver He has been under an incredible amount of stress  He notes he is feeling "my nerves are jacked up"-he denies any suicidal ideation He is trying to stay away from alcohol He is taking alprazolam 3 times daily, this is a long-term medication for him  Patient notes that Benadryl apparently made his hives worse in the past.  However, he has tried some hydroxyzine belonging to his wife previously and tolerated this okay  He coughed and brought up some blood 3 days ago, and then again this morning He shows me a photo of the blood, small volume but bright red He is not experience any vomiting  He is not having any lip or tongue swelling at this time -at the moment his hives are minimal   Patient Active Problem List   Diagnosis Date Noted   MDD (major depressive disorder), recurrent severe, without psychosis (HCC) 10/01/2021   Paroxysmal SVT (supraventricular tachycardia) (HCC)    Emphysema of lung (HCC)    Palpitations 01/09/2021   Minimal CAD 01/09/2021   Mixed hyperlipidemia 01/09/2021   Ascending aorta dilatation (HCC) 01/09/2021   Allergy 08/19/2020   Anxiety  08/19/2020   Arthritis 08/19/2020   Bell's palsy 08/19/2020   Chest pain 07/26/2020   Family history of early CAD 07/26/2020   DOE (dyspnea on exertion) 12/14/2019   Angina pectoris (HCC) 11/03/2019   Tobacco abuse 11/03/2019   Other hyperlipidemia 11/03/2019   Tonsillar hypertrophy 04/30/2017   Obstructive sleep apnea 04/23/2017   Polycythemia, secondary 09/29/2016   Polycythemia vera (HCC) 05/03/2015   Hemoptysis 04/03/2015   Condylomata acuminata 01/18/2015   Generalized anxiety disorder 03/05/2014    Past Medical History:  Diagnosis Date   Allergy    Anxiety    Arthritis    Bell's palsy    COPD (chronic obstructive pulmonary disease) (HCC)    Erythrocytosis 04/10/2015   Hyperlipidemia    Polycythemia vera (HCC) 05/03/2015    Past Surgical History:  Procedure Laterality Date   DENTAL SURGERY     HAND SURGERY     TONSILLECTOMY Bilateral 04/30/2017   Procedure: TONSILLECTOMY;  Surgeon: Osborn Coho, MD;  Location: [facility];  Service: ENT;  Laterality: Bilateral;    Social History   Tobacco Use   Smoking status: Every Day    Current packs/day: 2.00    Average packs/day: 2.0 packs/day for 24.0 years (48.0 ttl pk-yrs)    Types: Cigarettes   Smokeless tobacco: Never   Tobacco comments:    He is smoking 2 PPD.  Vaping Use   Vaping status: Never Used  Substance Use Topics   Alcohol use: Yes    Alcohol/week: 1.0 standard drink of alcohol    Types: 1 Cans of beer per week    Comment: 6-10 beer a week   Drug use: Yes    Types: Other-see comments, Marijuana    Comment: Marijuana every other day.    Family History  Problem Relation Age of Onset   COPD Mother    Fibromyalgia Mother    Heart disease Father    Stroke Father    Depression Brother    Heart disease Brother    Heart disease Maternal Grandmother    Stroke Maternal Grandmother    Heart disease Maternal Grandfather    Stroke Maternal Grandfather    Heart disease Paternal  Grandmother    Stroke Paternal Grandmother    Heart disease Paternal Grandfather    Stroke Paternal Grandfather    Stroke Paternal Aunt     Allergies  Allergen Reactions   Plavix [Clopidogrel Bisulfate] Other (See Comments)    Confusion, lethargy, slurred speech   Afrin [Nasal Spray] Hives   Antihistamines, Chlorpheniramine-Type Hives   Benadryl [Diphenhydramine Hcl] Hives   Buprenorphine Hcl Hives   Carisoprodol Hives   Codeine Hives   Metoclopramide Hives   Morphine And Codeine Hives   Oxymetazoline Hives   Pheniramine Hives and Rash    Medication list has been reviewed and updated.  Current Outpatient Medications on File Prior to Visit  Medication Sig Dispense Refill   albuterol (VENTOLIN HFA) 108 (90 Base) MCG/ACT inhaler Inhale 1-2 puffs into the lungs every 6 (six) hours as needed for wheezing or shortness of breath. 36 g 5   ALPRAZolam (XANAX) 1 MG tablet TAKE 1 TABLET BY MOUTH THREE TIMES A DAY AS NEEDED FOR ANXIETY 90 tablet 2   aspirin EC 81 MG tablet Take 162 mg by mouth at bedtime. Swallow whole. (Patient not taking: Reported on 08/26/2022)     doxycycline (VIBRAMYCIN) 100 MG capsule Take 1 capsule (100 mg total) by mouth 2 (two) times daily. 20 capsule 0   ipratropium-albuterol (DUONEB) 0.5-2.5 (3) MG/3ML SOLN Take 3 mLs by nebulization 2 (two) times daily. 360 mL 2   losartan (COZAAR) 25 MG tablet TAKE 1 TABLET (25 MG TOTAL) BY MOUTH DAILY. 90 tablet 2   nitroGLYCERIN (NITROSTAT) 0.4 MG SL tablet Place 1 tablet (0.4 mg total) under the tongue every 5 (five) minutes as needed for chest pain. 25 tablet 6   sildenafil (REVATIO) 20 MG tablet Take 20- 60 mg po prior to sexual activity as needed 30 tablet 5   Tetrahydrozoline HCl (VISINE OP) Apply 1 drop to eye as needed (itchy, dryness).     traMADol (ULTRAM) 50 MG tablet TAKE 1 TABLET (50 MG TOTAL) BY MOUTH EVERY 8 (EIGHT) HOURS AS NEEDED FOR UP TO 5 DAYS. 15 tablet 0   No current facility-administered medications on  file prior to visit.    Review of Systems:  As per HPI- otherwise negative.   Physical Examination: Vitals:   11/12/22 1102  BP: 130/62  Pulse: 73  Resp: 18  Temp: 97.8 F (36.6 C)  SpO2: 98%   Vitals:   11/12/22 1102  Height: 6\' 5"  (1.956 m)   Body mass index is 24.68 kg/m. Ideal Body Weight: Weight in (lb) to have BMI = 25: 210.4  GEN: no acute distress.  Anxious, describes a high level of chaos and stress which unfortunately is typical  for him HEENT: Atraumatic, Normocephalic.  Ears and Nose: No external deformity. CV: RRR, No M/G/R. No JVD. No thrill. No extra heart sounds. PULM: CTA B, no wheezes, crackles, rhonchi. No retractions. No resp. distress. No accessory muscle use. ABD: S, NT, ND, +BS. No rebound. No HSM. EXTR: No c/c/e PSYCH: Normally interactive. Conversant.  Evidence of resolving urticaria seen on trunk and extremities, no angioedema   Assessment and Plan: Hives - Plan: hydrOXYzine (VISTARIL) 25 MG capsule, predniSONE (DELTASONE) 20 MG tablet, EPINEPHrine 0.3 mg/0.3 mL IJ SOAJ injection  Hemoptysis - Plan: CT Chest Wo Contrast  Generalized anxiety disorder - Plan: ALPRAZolam (XANAX) 1 MG tablet  Patient seen today with concern of hives for about 3 weeks, he notes a long history of urticaria when he is under stress Prescribed hydroxyzine for him to use as needed for itching and urticaria.  Will try a short course of prednisone, if he develops emotional or physical side effects advised him he can stop Provide new EpiPen Ordered CT chest with contrast to follow-up on hemoptysis Refilled his alprazolam.  He expresses an interest in eventually tapering off of alprazolam but we both agree now is not the time Signed Abbe Amsterdam, MD

## 2022-11-19 ENCOUNTER — Other Ambulatory Visit: Payer: Self-pay | Admitting: Family Medicine

## 2022-11-19 DIAGNOSIS — L509 Urticaria, unspecified: Secondary | ICD-10-CM

## 2022-12-03 ENCOUNTER — Inpatient Hospital Stay: Payer: Self-pay

## 2022-12-03 ENCOUNTER — Inpatient Hospital Stay: Payer: Self-pay | Admitting: Medical Oncology

## 2022-12-04 ENCOUNTER — Telehealth (HOSPITAL_BASED_OUTPATIENT_CLINIC_OR_DEPARTMENT_OTHER): Payer: Self-pay

## 2022-12-08 ENCOUNTER — Inpatient Hospital Stay: Payer: Self-pay

## 2022-12-08 ENCOUNTER — Inpatient Hospital Stay: Payer: Self-pay | Admitting: Medical Oncology

## 2022-12-09 ENCOUNTER — Inpatient Hospital Stay: Payer: Self-pay | Attending: Hematology & Oncology

## 2022-12-09 ENCOUNTER — Inpatient Hospital Stay: Payer: Self-pay

## 2022-12-09 ENCOUNTER — Encounter: Payer: Self-pay | Admitting: Medical Oncology

## 2022-12-09 ENCOUNTER — Inpatient Hospital Stay (HOSPITAL_BASED_OUTPATIENT_CLINIC_OR_DEPARTMENT_OTHER): Payer: Self-pay | Admitting: Medical Oncology

## 2022-12-09 VITALS — BP 129/98 | HR 83 | Resp 20

## 2022-12-09 VITALS — BP 126/93 | HR 73 | Temp 98.3°F | Resp 18 | Wt 196.1 lb

## 2022-12-09 DIAGNOSIS — D751 Secondary polycythemia: Secondary | ICD-10-CM | POA: Insufficient documentation

## 2022-12-09 DIAGNOSIS — R042 Hemoptysis: Secondary | ICD-10-CM

## 2022-12-09 DIAGNOSIS — D72829 Elevated white blood cell count, unspecified: Secondary | ICD-10-CM | POA: Insufficient documentation

## 2022-12-09 DIAGNOSIS — F17200 Nicotine dependence, unspecified, uncomplicated: Secondary | ICD-10-CM | POA: Insufficient documentation

## 2022-12-09 DIAGNOSIS — D500 Iron deficiency anemia secondary to blood loss (chronic): Secondary | ICD-10-CM | POA: Insufficient documentation

## 2022-12-09 LAB — CBC WITH DIFFERENTIAL (CANCER CENTER ONLY)
Abs Immature Granulocytes: 0.03 10*3/uL (ref 0.00–0.07)
Basophils Absolute: 0.1 10*3/uL (ref 0.0–0.1)
Basophils Relative: 1 %
Eosinophils Absolute: 0.1 10*3/uL (ref 0.0–0.5)
Eosinophils Relative: 1 %
HCT: 51.1 % (ref 39.0–52.0)
Hemoglobin: 16.8 g/dL (ref 13.0–17.0)
Immature Granulocytes: 0 %
Lymphocytes Relative: 12 %
Lymphs Abs: 1.5 10*3/uL (ref 0.7–4.0)
MCH: 28.5 pg (ref 26.0–34.0)
MCHC: 32.9 g/dL (ref 30.0–36.0)
MCV: 86.8 fL (ref 80.0–100.0)
Monocytes Absolute: 0.8 10*3/uL (ref 0.1–1.0)
Monocytes Relative: 6 %
Neutro Abs: 10.1 10*3/uL — ABNORMAL HIGH (ref 1.7–7.7)
Neutrophils Relative %: 80 %
Platelet Count: 268 10*3/uL (ref 150–400)
RBC: 5.89 MIL/uL — ABNORMAL HIGH (ref 4.22–5.81)
RDW: 15 % (ref 11.5–15.5)
WBC Count: 12.6 10*3/uL — ABNORMAL HIGH (ref 4.0–10.5)
nRBC: 0 % (ref 0.0–0.2)

## 2022-12-09 LAB — IRON AND IRON BINDING CAPACITY (CC-WL,HP ONLY)
Iron: 48 ug/dL (ref 45–182)
Saturation Ratios: 9 % — ABNORMAL LOW (ref 17.9–39.5)
TIBC: 521 ug/dL — ABNORMAL HIGH (ref 250–450)
UIBC: 473 ug/dL — ABNORMAL HIGH (ref 117–376)

## 2022-12-09 LAB — CMP (CANCER CENTER ONLY)
ALT: 15 U/L (ref 0–44)
AST: 16 U/L (ref 15–41)
Albumin: 4.3 g/dL (ref 3.5–5.0)
Alkaline Phosphatase: 51 U/L (ref 38–126)
Anion gap: 10 (ref 5–15)
BUN: 12 mg/dL (ref 6–20)
CO2: 26 mmol/L (ref 22–32)
Calcium: 8.9 mg/dL (ref 8.9–10.3)
Chloride: 99 mmol/L (ref 98–111)
Creatinine: 0.86 mg/dL (ref 0.61–1.24)
GFR, Estimated: >60 mL/min (ref >60)
Glucose, Bld: 98 mg/dL (ref 70–99)
Potassium: 4.1 mmol/L (ref 3.5–5.1)
Sodium: 135 mmol/L (ref 135–145)
Total Bilirubin: 0.8 mg/dL (ref <1.2)
Total Protein: 7.9 g/dL (ref 6.5–8.1)

## 2022-12-09 LAB — FERRITIN: Ferritin: 15 ng/mL — ABNORMAL LOW (ref 24–336)

## 2022-12-09 LAB — D-DIMER, QUANTITATIVE: D-Dimer, Quant: <0.27 ug{FEU}/mL (ref 0.00–0.50)

## 2022-12-09 NOTE — Progress Notes (Signed)
Hematology and Oncology Follow Up Visit  Stuart Hill [id number] Jun 01, 1970 52 y.o. 12/09/2022   Principle Diagnosis:   Secondary polycythemia JAK2 (-), smoker  LEFT Bell's palsy Leukocytosis    Current Therapy:        Phlebotomy to maintain hematocrit below 45% 2 baby aspirin daily at bedtime - not taking per patient due to depression   Interim History:  [name] is here today for follow-up and potential phlebotomy.   Today he reports that he is feeling poorly which has been stable for him recently. He had uncontrolled depression without SI/HI and has hemoptysis. His PCP Dr. Patsy Hill has ordered a chest CT which has been ordered but not performed.  No bruising or petechiae.  No fever, chills, n/v, cough, rash, dizziness, SOB, chest pain, palpitations, abdominal pain or changes in bowel or bladder habits.  No swelling, tenderness, numbness or tingling in his extremities.  No falls or syncope.  Appetite is fair and he is trying drink more water. He states that he does drink quite a bit of wine daily.  Still smoking daily. Bedridden due to depression.  Weight is stable  ECOG Performance Status: 1 - Symptomatic but completely ambulatory  Medications:  Allergies as of 12/09/2022       Reactions   Plavix [clopidogrel Bisulfate] Other (See Comments)   Confusion, lethargy, slurred speech   Afrin [nasal Spray] Hives   Antihistamines, Chlorpheniramine-type Hives   Benadryl [diphenhydramine Hcl] Hives   Buprenorphine Hcl Hives   Carisoprodol Hives   Codeine Hives   Metoclopramide Hives   Morphine And Codeine Hives   Oxymetazoline Hives   Pheniramine Hives, Rash        Medication List        Accurate as of December 09, 2022  1:13 PM. If you have any questions, ask your nurse or doctor.          albuterol 108 (90 Base) MCG/ACT inhaler Commonly known as: VENTOLIN HFA Inhale 1-2 puffs into the lungs every 6 (six) hours as needed for wheezing or shortness of  breath.   ALPRAZolam 1 MG tablet Commonly known as: XANAX TAKE 1 TABLET BY MOUTH THREE TIMES A DAY AS NEEDED FOR ANXIETY   aspirin EC 81 MG tablet Take 162 mg by mouth at bedtime. Swallow whole.   doxycycline 100 MG capsule Commonly known as: VIBRAMYCIN Take 1 capsule (100 mg total) by mouth 2 (two) times daily.   EPINEPHrine 0.3 mg/0.3 mL Soaj injection Commonly known as: EPI-PEN Inject 0.3 mg into the muscle as needed for anaphylaxis.   hydrOXYzine 25 MG capsule Commonly known as: VISTARIL TAKE 1 CAPSULE (25 MG TOTAL) BY MOUTH EVERY 8 (EIGHT) HOURS AS NEEDED.   ipratropium-albuterol 0.5-2.5 (3) MG/3ML Soln Commonly known as: DUONEB Take 3 mLs by nebulization 2 (two) times daily.   losartan 25 MG tablet Commonly known as: COZAAR TAKE 1 TABLET (25 MG TOTAL) BY MOUTH DAILY.   nitroGLYCERIN 0.4 MG SL tablet Commonly known as: NITROSTAT Place 1 tablet (0.4 mg total) under the tongue every 5 (five) minutes as needed for chest pain.   predniSONE 20 MG tablet Commonly known as: DELTASONE Take 40 mg by mouth daily for 3 days, then 20 mg by mouth daily for 3 days   sildenafil 20 MG tablet Commonly known as: REVATIO Take 20- 60 mg po prior to sexual activity as needed   traMADol 50 MG tablet Commonly known as: ULTRAM TAKE 1 TABLET (50 MG TOTAL) BY MOUTH EVERY  8 (EIGHT) HOURS AS NEEDED FOR UP TO 5 DAYS.   VISINE OP Apply 1 drop to eye as needed (itchy, dryness).        Allergies:  Allergies  Allergen Reactions   Plavix [Clopidogrel Bisulfate] Other (See Comments)    Confusion, lethargy, slurred speech   Afrin [Nasal Spray] Hives   Antihistamines, Chlorpheniramine-Type Hives   Benadryl [Diphenhydramine Hcl] Hives   Buprenorphine Hcl Hives   Carisoprodol Hives   Codeine Hives   Metoclopramide Hives   Morphine And Codeine Hives   Oxymetazoline Hives   Pheniramine Hives and Rash    Past Medical History, Surgical history, Social history, and Family History were  reviewed and updated.  Review of Systems: All other 10 point review of systems is negative.   Physical Exam:  weight is 196 lb 1.9 oz (89 kg). His oral temperature is 98.3 F (36.8 C). His blood pressure is 126/93 (abnormal) and his pulse is 73. His respiration is 18 and oxygen saturation is 100%.   Wt Readings from Last 3 Encounters:  12/09/22 196 lb 1.9 oz (89 kg)  08/26/22 208 lb 1.9 oz (94.4 kg)  07/23/22 209 lb 12.8 oz (95.2 kg)   Constitutional: Clinically stable. NAD Ocular: Sclerae unicteric, pupils equal, round and reactive to light Ear-nose-throat: Oropharynx clear, dentition fair Lymphatic: No cervical or supraclavicular adenopathy Lungs no rales or rhonchi, good excursion bilaterally Heart regular rate and rhythm, no murmur appreciated Abd soft, nontender, positive bowel sounds MSK no focal spinal tenderness, no joint edema Neuro: non-focal, well-oriented, appropriate affect  Lab Results  Component Value Date   WBC 12.6 (H) 12/09/2022   HGB 16.8 12/09/2022   HCT 51.1 12/09/2022   MCV 86.8 12/09/2022   PLT 268 12/09/2022   Lab Results  Component Value Date   FERRITIN 7 (L) 08/26/2022   IRON 30 (L) 08/26/2022   TIBC 594 (H) 08/26/2022   UIBC 564 (H) 08/26/2022   IRONPCTSAT 5 (L) 08/26/2022   Lab Results  Component Value Date   RETICCTPCT 1.8 04/22/2022   RBC 5.89 (H) 12/09/2022   No results found for: "KPAFRELGTCHN", "LAMBDASER", "KAPLAMBRATIO" No results found for: "IGGSERUM", "IGA", "IGMSERUM" No results found for: "TOTALPROTELP", "ALBUMINELP", "A1GS", "A2GS", "BETS", "BETA2SER", "GAMS", "MSPIKE", "SPEI"   Chemistry      Component Value Date/Time   NA 137 08/26/2022 1307   NA 143 01/01/2017 1453   NA 139 11/12/2015 0945   K 4.4 08/26/2022 1307   K 4.3 01/01/2017 1453   K 3.7 11/12/2015 0945   CL 102 08/26/2022 1307   CL 103 01/01/2017 1453   CO2 28 08/26/2022 1307   CO2 27 01/01/2017 1453   CO2 22 11/12/2015 0945   BUN 10 08/26/2022 1307    BUN 18 01/01/2017 1453   BUN 13.1 11/12/2015 0945   CREATININE 0.94 08/26/2022 1307   CREATININE 0.96 10/05/2019 1042   CREATININE 0.9 11/12/2015 0945      Component Value Date/Time   CALCIUM 9.2 08/26/2022 1307   CALCIUM 8.8 01/01/2017 1453   CALCIUM 8.9 11/12/2015 0945   ALKPHOS 57 08/26/2022 1307   ALKPHOS 44 01/01/2017 1453   ALKPHOS 66 11/12/2015 0945   AST 20 08/26/2022 1307   AST 13 11/12/2015 0945   ALT 23 08/26/2022 1307   ALT 368 (HH) 01/01/2017 1453   ALT 14 11/12/2015 0945   BILITOT 0.5 08/26/2022 1307   BILITOT <0.22 11/12/2015 0945     Encounter Diagnoses  Name Primary?   Polycythemia, secondary  Yes   Iron deficiency anemia due to chronic blood loss     Impression and Plan: Mr. Bandy is a 52 yo caucasian male with history of secondary polycythemia, JAK2 negative; smoker.    HCT is 51.1% today however my concern is more on his hemoptysis and mood given that he has secondary polycythemia which has not been shown to have the same risks as PV. D-dimer pending. I have also messaged his PCP discussing the potential change in imaging plan based off of D-dimer result. Iron levels are lower which is worsening his fatigue and he thinks his mood. He does report a headache which he gets when his HCT is elevated. He wants the phlebotomy today. Smoking cessation encouraged. Mental health safety precautions discussed. He is agreeable to discuss further with his PCP.   RTC 2 months APP, labs (CBC w/, CMP, ferritin, iron), phlebotomy   Rushie Chestnut, PA-C 11/13/20241:13 PM

## 2022-12-09 NOTE — Patient Instructions (Signed)

## 2022-12-09 NOTE — Progress Notes (Signed)
Stuart Hill presents today for phlebotomy per MD orders. Phlebotomy procedure started at 1342 and ended at 1415. 550 grams removed. Patient refused to stay for 30 minutes post observation and is without complaints at time of discharge.  Patient tolerated procedure well. IV needle removed intact.

## 2022-12-16 ENCOUNTER — Encounter: Payer: Self-pay | Admitting: Family Medicine

## 2022-12-18 ENCOUNTER — Telehealth (HOSPITAL_BASED_OUTPATIENT_CLINIC_OR_DEPARTMENT_OTHER): Payer: Self-pay

## 2023-02-09 ENCOUNTER — Inpatient Hospital Stay: Payer: Self-pay

## 2023-02-09 ENCOUNTER — Inpatient Hospital Stay: Payer: Self-pay | Admitting: Medical Oncology

## 2023-02-10 ENCOUNTER — Inpatient Hospital Stay: Payer: Self-pay | Admitting: Medical Oncology

## 2023-02-10 ENCOUNTER — Inpatient Hospital Stay: Payer: Self-pay

## 2023-02-10 ENCOUNTER — Inpatient Hospital Stay: Payer: Self-pay | Attending: Hematology & Oncology

## 2023-02-14 ENCOUNTER — Other Ambulatory Visit: Payer: Self-pay | Admitting: Family Medicine

## 2023-02-14 DIAGNOSIS — L509 Urticaria, unspecified: Secondary | ICD-10-CM

## 2023-03-19 ENCOUNTER — Other Ambulatory Visit: Payer: Self-pay | Admitting: Family Medicine

## 2023-03-19 DIAGNOSIS — F411 Generalized anxiety disorder: Secondary | ICD-10-CM

## 2023-05-05 ENCOUNTER — Inpatient Hospital Stay: Payer: Self-pay

## 2023-05-05 ENCOUNTER — Inpatient Hospital Stay (HOSPITAL_BASED_OUTPATIENT_CLINIC_OR_DEPARTMENT_OTHER): Payer: Self-pay | Admitting: Medical Oncology

## 2023-05-05 ENCOUNTER — Inpatient Hospital Stay: Payer: Self-pay | Attending: Hematology & Oncology

## 2023-05-05 ENCOUNTER — Encounter: Payer: Self-pay | Admitting: Medical Oncology

## 2023-05-05 ENCOUNTER — Telehealth: Payer: Self-pay | Admitting: Family Medicine

## 2023-05-05 VITALS — BP 152/84 | HR 62 | Temp 97.2°F | Resp 20

## 2023-05-05 VITALS — BP 145/93 | HR 59 | Temp 97.8°F | Resp 18 | Ht 77.0 in | Wt 193.0 lb

## 2023-05-05 DIAGNOSIS — D500 Iron deficiency anemia secondary to blood loss (chronic): Secondary | ICD-10-CM

## 2023-05-05 DIAGNOSIS — D751 Secondary polycythemia: Secondary | ICD-10-CM

## 2023-05-05 DIAGNOSIS — F17210 Nicotine dependence, cigarettes, uncomplicated: Secondary | ICD-10-CM | POA: Insufficient documentation

## 2023-05-05 DIAGNOSIS — Z6379 Other stressful life events affecting family and household: Secondary | ICD-10-CM

## 2023-05-05 DIAGNOSIS — Z7982 Long term (current) use of aspirin: Secondary | ICD-10-CM | POA: Insufficient documentation

## 2023-05-05 LAB — CBC WITH DIFFERENTIAL (CANCER CENTER ONLY)
Abs Immature Granulocytes: 0.03 10*3/uL (ref 0.00–0.07)
Basophils Absolute: 0.1 10*3/uL (ref 0.0–0.1)
Basophils Relative: 1 %
Eosinophils Absolute: 0.1 10*3/uL (ref 0.0–0.5)
Eosinophils Relative: 1 %
HCT: 52.4 % — ABNORMAL HIGH (ref 39.0–52.0)
Hemoglobin: 17.5 g/dL — ABNORMAL HIGH (ref 13.0–17.0)
Immature Granulocytes: 0 %
Lymphocytes Relative: 24 %
Lymphs Abs: 1.9 10*3/uL (ref 0.7–4.0)
MCH: 29.7 pg (ref 26.0–34.0)
MCHC: 33.4 g/dL (ref 30.0–36.0)
MCV: 88.8 fL (ref 80.0–100.0)
Monocytes Absolute: 0.8 10*3/uL (ref 0.1–1.0)
Monocytes Relative: 10 %
Neutro Abs: 4.9 10*3/uL (ref 1.7–7.7)
Neutrophils Relative %: 64 %
Platelet Count: 246 10*3/uL (ref 150–400)
RBC: 5.9 MIL/uL — ABNORMAL HIGH (ref 4.22–5.81)
RDW: 16 % — ABNORMAL HIGH (ref 11.5–15.5)
WBC Count: 7.7 10*3/uL (ref 4.0–10.5)
nRBC: 0 % (ref 0.0–0.2)

## 2023-05-05 LAB — FERRITIN: Ferritin: 14 ng/mL — ABNORMAL LOW (ref 24–336)

## 2023-05-05 LAB — CMP (CANCER CENTER ONLY)
ALT: 14 U/L (ref 0–44)
AST: 16 U/L (ref 15–41)
Albumin: 4.7 g/dL (ref 3.5–5.0)
Alkaline Phosphatase: 63 U/L (ref 38–126)
Anion gap: 8 (ref 5–15)
BUN: 14 mg/dL (ref 6–20)
CO2: 29 mmol/L (ref 22–32)
Calcium: 9.2 mg/dL (ref 8.9–10.3)
Chloride: 103 mmol/L (ref 98–111)
Creatinine: 0.93 mg/dL (ref 0.61–1.24)
GFR, Estimated: >60 mL/min (ref >60)
Glucose, Bld: 107 mg/dL — ABNORMAL HIGH (ref 70–99)
Potassium: 4.2 mmol/L (ref 3.5–5.1)
Sodium: 140 mmol/L (ref 135–145)
Total Bilirubin: 0.7 mg/dL (ref 0.0–1.2)
Total Protein: 7.4 g/dL (ref 6.5–8.1)

## 2023-05-05 LAB — IRON AND IRON BINDING CAPACITY (CC-WL,HP ONLY)
Iron: 133 ug/dL (ref 45–182)
Saturation Ratios: 25 % (ref 17.9–39.5)
TIBC: 536 ug/dL — ABNORMAL HIGH (ref 250–450)
UIBC: 403 ug/dL — ABNORMAL HIGH (ref 117–376)

## 2023-05-05 MED ORDER — LOSARTAN POTASSIUM 25 MG PO TABS: 25.0000 mg | ORAL_TABLET | Freq: Every day | ORAL | 2 refills | Status: DC

## 2023-05-05 NOTE — Patient Instructions (Signed)

## 2023-05-05 NOTE — Progress Notes (Signed)
 Hematology and Oncology Follow Up Visit  Stuart Hill [id number] [birth year] 53 y.o. 05/05/2023   Principle Diagnosis:   Secondary polycythemia JAK2 (-), smoker  LEFT Bell's palsy Leukocytosis    Current Therapy:        Phlebotomy to maintain hematocrit below 45% 2 baby aspirin daily at bedtime - not taking per patient due to depression   Interim History:  Stuart Hill is here today for follow-up and potential phlebotomy.   Today he states that he has been stressed from life changes/stressors. He is here with the support of his wife.   He continues to work with his PCP regarding his mental health.  No bruising or petechiae.  No fever, chills, n/v, cough, rash, dizziness, SOB, chest pain, palpitations, abdominal pain or changes in bowel or bladder habits.  No swelling, tenderness, numbness or tingling in his extremities.  No falls or syncope.  Appetite is fair and he is trying drink more water. He states that he does drink quite a bit of wine daily.  Smoking 2 packs per day Weight is stable  ECOG Performance Status: 1 - Symptomatic but completely ambulatory  Medications:  Allergies as of 05/05/2023       Reactions   Hydroxyzine Pamoate Hives, Itching   Plavix [clopidogrel Bisulfate] Other (See Comments)   Confusion, lethargy, slurred speech   Afrin [nasal Spray] Hives   Antihistamines, Chlorpheniramine-type Hives   Benadryl [diphenhydramine Hcl] Hives   Buprenorphine Hcl Hives   Carisoprodol Hives   Codeine Hives   Metoclopramide Hives   Morphine And Codeine Hives   Oxymetazoline Hives   Pheniramine Hives, Rash        Medication List        Accurate as of May 05, 2023 11:24 AM. If you have any questions, ask your nurse or doctor.          STOP taking these medications    doxycycline 100 MG capsule Commonly known as: VIBRAMYCIN Stopped by: Rushie Chestnut   hydrOXYzine 25 MG capsule Commonly known as: VISTARIL Stopped by: Rushie Chestnut    predniSONE 20 MG tablet Commonly known as: DELTASONE Stopped by: Rushie Chestnut       TAKE these medications    albuterol 108 (90 Base) MCG/ACT inhaler Commonly known as: VENTOLIN HFA Inhale 1-2 puffs into the lungs every 6 (six) hours as needed for wheezing or shortness of breath.   ALPRAZolam 1 MG tablet Commonly known as: XANAX TAKE 1 TABLET BY MOUTH THREE TIMES A DAY AS NEEDED FOR ANXIETY   aspirin EC 81 MG tablet Take 162 mg by mouth at bedtime. Swallow whole.   EPINEPHrine 0.3 mg/0.3 mL Soaj injection Commonly known as: EPI-PEN Inject 0.3 mg into the muscle as needed for anaphylaxis.   ipratropium-albuterol 0.5-2.5 (3) MG/3ML Soln Commonly known as: DUONEB Take 3 mLs by nebulization 2 (two) times daily.   losartan 25 MG tablet Commonly known as: COZAAR TAKE 1 TABLET (25 MG TOTAL) BY MOUTH DAILY.   nitroGLYCERIN 0.4 MG SL tablet Commonly known as: NITROSTAT Place 1 tablet (0.4 mg total) under the tongue every 5 (five) minutes as needed for chest pain.   sildenafil 20 MG tablet Commonly known as: REVATIO Take 20- 60 mg po prior to sexual activity as needed   traMADol 50 MG tablet Commonly known as: ULTRAM TAKE 1 TABLET (50 MG TOTAL) BY MOUTH EVERY 8 (EIGHT) HOURS AS NEEDED FOR UP TO 5 DAYS.   VISINE OP Apply 1 drop  to eye as needed (itchy, dryness).        Allergies:  Allergies  Allergen Reactions   Hydroxyzine Pamoate Hives and Itching   Plavix [Clopidogrel Bisulfate] Other (See Comments)    Confusion, lethargy, slurred speech   Afrin [Nasal Spray] Hives   Antihistamines, Chlorpheniramine-Type Hives   Benadryl [Diphenhydramine Hcl] Hives   Buprenorphine Hcl Hives   Carisoprodol Hives   Codeine Hives   Metoclopramide Hives   Morphine And Codeine Hives   Oxymetazoline Hives   Pheniramine Hives and Rash    Past Medical History, Surgical history, Social history, and Family History were reviewed and updated.  Review of Systems: All other 10  point review of systems is negative.   Physical Exam:  height is 6\' 5"  (1.956 m) and weight is 193 lb (87.5 kg). His oral temperature is 97.8 F (36.6 C). His blood pressure is 145/93 (abnormal) and his pulse is 59 (abnormal). His respiration is 18 and oxygen saturation is 100%.   Wt Readings from Last 3 Encounters:  05/05/23 193 lb (87.5 kg)  12/09/22 196 lb 1.9 oz (89 kg)  08/26/22 208 lb 1.9 oz (94.4 kg)   Constitutional: Clinically stable. NAD Ocular: Sclerae unicteric, pupils equal, round and reactive to light Ear-nose-throat: Oropharynx clear, dentition fair Lymphatic: No cervical or supraclavicular adenopathy Lungs no rales or rhonchi, good excursion bilaterally Heart regular rate and rhythm, no murmur appreciated Abd soft, nontender, positive bowel sounds MSK no focal spinal tenderness, no joint edema Neuro: non-focal, well-oriented, appropriate affect  Lab Results  Component Value Date   WBC 7.7 05/05/2023   HGB 17.5 (H) 05/05/2023   HCT 52.4 (H) 05/05/2023   MCV 88.8 05/05/2023   PLT 246 05/05/2023   Lab Results  Component Value Date   FERRITIN 15 (L) 12/09/2022   IRON 48 12/09/2022   TIBC 521 (H) 12/09/2022   UIBC 473 (H) 12/09/2022   IRONPCTSAT 9 (L) 12/09/2022   Lab Results  Component Value Date   RETICCTPCT 1.8 04/22/2022   RBC 5.90 (H) 05/05/2023   No results found for: "KPAFRELGTCHN", "LAMBDASER", "KAPLAMBRATIO" No results found for: "IGGSERUM", "IGA", "IGMSERUM" No results found for: "TOTALPROTELP", "ALBUMINELP", "A1GS", "A2GS", "BETS", "BETA2SER", "GAMS", "MSPIKE", "SPEI"   Chemistry      Component Value Date/Time   NA 140 05/05/2023 1030   NA 143 01/01/2017 1453   NA 139 11/12/2015 0945   K 4.2 05/05/2023 1030   K 4.3 01/01/2017 1453   K 3.7 11/12/2015 0945   CL 103 05/05/2023 1030   CL 103 01/01/2017 1453   CO2 29 05/05/2023 1030   CO2 27 01/01/2017 1453   CO2 22 11/12/2015 0945   BUN 14 05/05/2023 1030   BUN 18 01/01/2017 1453   BUN  13.1 11/12/2015 0945   CREATININE 0.93 05/05/2023 1030   CREATININE 0.96 10/05/2019 1042   CREATININE 0.9 11/12/2015 0945      Component Value Date/Time   CALCIUM 9.2 05/05/2023 1030   CALCIUM 8.8 01/01/2017 1453   CALCIUM 8.9 11/12/2015 0945   ALKPHOS 63 05/05/2023 1030   ALKPHOS 44 01/01/2017 1453   ALKPHOS 66 11/12/2015 0945   AST 16 05/05/2023 1030   AST 13 11/12/2015 0945   ALT 14 05/05/2023 1030   ALT 368 (HH) 01/01/2017 1453   ALT 14 11/12/2015 0945   BILITOT 0.7 05/05/2023 1030   BILITOT <0.22 11/12/2015 0945     Encounter Diagnoses  Name Primary?   Polycythemia, secondary Yes   Iron deficiency anemia  due to chronic blood loss    Stressful life event affecting family     Impression and Plan: Stuart Hill is a 53 yo caucasian male with history of secondary polycythemia, JAK2 negative; smoker.  Phlebotomy to maintain hematocrit below 45%  I have placed a social work referral for patient.   Today his HCT is 52.4% with Hgb of 17.5 Phlebotomy today RTC 2 weeks phlebotomy  RTC 1 months APP, labs (CBC w/, CMP, ferritin, iron), phlebotomy   Rushie Chestnut, PA-C 4/9/202511:24 AM

## 2023-05-05 NOTE — Progress Notes (Signed)
 Stuart Hill presents today for phlebotomy per MD orders. Phlebotomy procedure started at 1145 and ended at 1210. 500 grams removed. Patient observed for 30 minutes after procedure without any incident. Patient tolerated procedure well. IV needle removed intact. Pt VSS after procedure. Pt declined to stay for post procedure. Steady gait noted. Denies distress. Family member with paitent when discharged.

## 2023-05-05 NOTE — Telephone Encounter (Signed)
 Pt came by the office to get his BP MEDS refill- losartan. His BP is running has been running high lately. [facility] [address]. Can call him if any ? At [phone]. Thanks jsi

## 2023-05-06 ENCOUNTER — Telehealth: Payer: Self-pay

## 2023-05-06 ENCOUNTER — Encounter: Payer: Self-pay | Admitting: Medical Oncology

## 2023-05-06 NOTE — Telephone Encounter (Signed)
 Clinical Social Work was referred by Clent Jacks, medical provider for assessment of psychosocial needs.  CSW attempted to contact patient by phone.  Left voicemail with contact information and request for return call.

## 2023-05-07 ENCOUNTER — Encounter: Payer: Self-pay | Admitting: Family Medicine

## 2023-05-07 ENCOUNTER — Inpatient Hospital Stay: Payer: Self-pay

## 2023-05-07 ENCOUNTER — Other Ambulatory Visit: Payer: Self-pay | Admitting: Family Medicine

## 2023-05-07 DIAGNOSIS — R079 Chest pain, unspecified: Secondary | ICD-10-CM

## 2023-05-07 NOTE — Telephone Encounter (Signed)
 Copied from CRM [id number]. Topic: Clinical - Medication Refill >> May 07, 2023 12:22 PM Aisha D wrote: Most Recent Primary Care Visit:  Provider: Pearline Cables  Department: [facility]  Visit Type: OFFICE VISIT  Date: 11/12/2022  Medication: nitroGLYCERIN (NITROSTAT) 0.4 MG SL tablet  Has the patient contacted their pharmacy? Yes (Agent: If no, request that the patient contact the pharmacy for the refill. If patient does not wish to contact the pharmacy document the reason why and proceed with request.) (Agent: If yes, when and what did the pharmacy advise?) advised to contact PCP  Is this the correct pharmacy for this prescription? Yes If no, delete pharmacy and type the correct one.  This is the patient's preferred pharmacy:  [id number] [place], [place] - [address] [place] [address] Phone: [phone] Fax: [phone]   Has the prescription been filled recently? No  Is the patient out of the medication? Yes  Has the patient been seen for an appointment in the last year OR does the patient have an upcoming appointment? Yes  Can we respond through MyChart? No  Agent: Please be advised that Rx refills may take up to 3 business days. We ask that you follow-up with your pharmacy.

## 2023-05-07 NOTE — Progress Notes (Signed)
 [facility] Clinical Social Work  Clinical Social Work was referred by medical provider for assessment of psychosocial needs.  Clinical Social Worker contacted patient by phone to offer support and assess for needs.    CSW provided active listening while patient discussed his stressful life situation.  Patient is not eligible for the [facility] due to non-cancer diagnosis.  CSW offered other resources, but patient declined at this time.     Dorothey Baseman, LCSW  Clinical Social Worker [facility]

## 2023-05-09 MED ORDER — NITROGLYCERIN 0.4 MG SL SUBL: 0.4000 mg | SUBLINGUAL_TABLET | SUBLINGUAL | 6 refills | Status: AC | PRN

## 2023-05-19 ENCOUNTER — Inpatient Hospital Stay: Payer: Self-pay

## 2023-05-31 ENCOUNTER — Inpatient Hospital Stay: Payer: Self-pay

## 2023-05-31 ENCOUNTER — Other Ambulatory Visit: Payer: Self-pay | Admitting: Family Medicine

## 2023-05-31 ENCOUNTER — Encounter: Payer: Self-pay | Admitting: Family

## 2023-05-31 ENCOUNTER — Encounter: Payer: Self-pay | Admitting: Family Medicine

## 2023-05-31 ENCOUNTER — Inpatient Hospital Stay (HOSPITAL_BASED_OUTPATIENT_CLINIC_OR_DEPARTMENT_OTHER): Payer: Self-pay | Admitting: Family

## 2023-05-31 ENCOUNTER — Inpatient Hospital Stay: Payer: Self-pay | Attending: Hematology & Oncology

## 2023-05-31 VITALS — BP 148/87 | HR 54 | Temp 97.6°F | Resp 18 | Ht 77.0 in | Wt 182.8 lb

## 2023-05-31 DIAGNOSIS — Z599 Problem related to housing and economic circumstances, unspecified: Secondary | ICD-10-CM

## 2023-05-31 DIAGNOSIS — F17200 Nicotine dependence, unspecified, uncomplicated: Secondary | ICD-10-CM | POA: Insufficient documentation

## 2023-05-31 DIAGNOSIS — D72829 Elevated white blood cell count, unspecified: Secondary | ICD-10-CM | POA: Insufficient documentation

## 2023-05-31 DIAGNOSIS — D500 Iron deficiency anemia secondary to blood loss (chronic): Secondary | ICD-10-CM

## 2023-05-31 DIAGNOSIS — D751 Secondary polycythemia: Secondary | ICD-10-CM | POA: Insufficient documentation

## 2023-05-31 DIAGNOSIS — Z7982 Long term (current) use of aspirin: Secondary | ICD-10-CM | POA: Insufficient documentation

## 2023-05-31 LAB — CBC WITH DIFFERENTIAL (CANCER CENTER ONLY)
Abs Immature Granulocytes: 0.01 10*3/uL (ref 0.00–0.07)
Basophils Absolute: 0.1 10*3/uL (ref 0.0–0.1)
Basophils Relative: 1 %
Eosinophils Absolute: 0.1 10*3/uL (ref 0.0–0.5)
Eosinophils Relative: 1 %
HCT: 51.2 % (ref 39.0–52.0)
Hemoglobin: 17.2 g/dL — ABNORMAL HIGH (ref 13.0–17.0)
Immature Granulocytes: 0 %
Lymphocytes Relative: 23 %
Lymphs Abs: 1.7 10*3/uL (ref 0.7–4.0)
MCH: 29.8 pg (ref 26.0–34.0)
MCHC: 33.6 g/dL (ref 30.0–36.0)
MCV: 88.7 fL (ref 80.0–100.0)
Monocytes Absolute: 0.7 10*3/uL (ref 0.1–1.0)
Monocytes Relative: 10 %
Neutro Abs: 4.8 10*3/uL (ref 1.7–7.7)
Neutrophils Relative %: 65 %
Platelet Count: 225 10*3/uL (ref 150–400)
RBC: 5.77 MIL/uL (ref 4.22–5.81)
RDW: 14.8 % (ref 11.5–15.5)
WBC Count: 7.3 10*3/uL (ref 4.0–10.5)
nRBC: 0 % (ref 0.0–0.2)

## 2023-05-31 LAB — IRON AND IRON BINDING CAPACITY (CC-WL,HP ONLY)
Iron: 114 ug/dL (ref 45–182)
Saturation Ratios: 25 % (ref 17.9–39.5)
TIBC: 454 ug/dL — ABNORMAL HIGH (ref 250–450)
UIBC: 340 ug/dL (ref 117–376)

## 2023-05-31 LAB — CMP (CANCER CENTER ONLY)
ALT: 10 U/L (ref 0–44)
AST: 15 U/L (ref 15–41)
Albumin: 4.5 g/dL (ref 3.5–5.0)
Alkaline Phosphatase: 53 U/L (ref 38–126)
Anion gap: 6 (ref 5–15)
BUN: 9 mg/dL (ref 6–20)
CO2: 29 mmol/L (ref 22–32)
Calcium: 9.4 mg/dL (ref 8.9–10.3)
Chloride: 103 mmol/L (ref 98–111)
Creatinine: 0.98 mg/dL (ref 0.61–1.24)
GFR, Estimated: >60 mL/min (ref >60)
Glucose, Bld: 84 mg/dL (ref 70–99)
Potassium: 4.4 mmol/L (ref 3.5–5.1)
Sodium: 138 mmol/L (ref 135–145)
Total Bilirubin: 0.8 mg/dL (ref 0.0–1.2)
Total Protein: 7.2 g/dL (ref 6.5–8.1)

## 2023-05-31 LAB — FERRITIN: Ferritin: 26 ng/mL (ref 24–336)

## 2023-05-31 NOTE — Patient Instructions (Signed)

## 2023-05-31 NOTE — Progress Notes (Signed)
 Stuart Hill presents today for phlebotomy per MD orders. Phlebotomy procedure started at 1405 and ended at 1420. 500 grams removed. Patient declined to stay for 30 minute observation time. Drank po fluids well.  Tolerated procedure well. Steady gait noted upon ambulated. Rt arm AC iv site is clean, dry intact.

## 2023-05-31 NOTE — Progress Notes (Signed)
 Hematology and Oncology Follow Up Visit  [name] [id number] 01-14-1971 53 y.o. 05/31/2023   Principle Diagnosis:  Secondary polycythemia JAK2 (-), smoker  LEFT Bell's palsy Leukocytosis    Current Therapy:        Phlebotomy to maintain hematocrit below 45% 2 baby aspirin  daily at bedtime - not taking per patient due to depression   Interim History:  [name] is here today for follow-up with his wife. He is feeling fatigued with depressed and anxious. He is going through a lot of family stress with his mother's health and is also in danger of becoming homeless soon.  Unfortunately we do not have much in the way of financial resources for our hematology patients. I was able to speak with his PCP and she has placed a consult with their social worker to try and help him through this time of crisis.   He did not feel that he needed to visit his PCP at this time.  He states that he has applied for dis ability and is waiting to hear back.  No issue with infection. No fever, chills, n/v cough, rash, abdominal pain or changes in bowel or bladder habits.  He has history of Bell's palsy and with stress and gritting his teeth he states that his speech is slurred as a result.  BP is stable at 148/87, HR 54.  He is only taking aspirin  right now as that is all he can afford at this time.  He will occasionally take a nitroglycerin  tablet for brief episodes of chest pain.  No swelling in his extremities.  No falls or syncope.  Appetite is poor and hydration is fair. He is smoking heavily and states that he is only eating 1 sandwich a day. His weight is down 11 lbs since we saw him a month ago.   ECOG Performance Status: 1 - Symptomatic but completely ambulatory  Medications:  Allergies as of 05/31/2023       Reactions   Hydroxyzine  Pamoate Hives, Itching   Plavix  [clopidogrel  Bisulfate] Other (See Comments)   Confusion, lethargy, slurred speech   Afrin [nasal Spray] Hives    Antihistamines, Chlorpheniramine-type Hives   Benadryl  [diphenhydramine  Hcl] Hives   Buprenorphine Hcl Hives   Carisoprodol Hives   Codeine Hives   Metoclopramide Hives   Morphine  And Codeine Hives   Oxymetazoline Hives   Pheniramine Hives, Rash        Medication List        Accurate as of May 31, 2023  1:35 PM. If you have any questions, ask your nurse or doctor.          STOP taking these medications    EPINEPHrine  0.3 mg/0.3 mL Soaj injection Commonly known as: EPI-PEN Stopped by: Kennard Pea   ipratropium-albuterol  0.5-2.5 (3) MG/3ML Soln Commonly known as: DUONEB Stopped by: Kennard Pea   losartan  25 MG tablet Commonly known as: COZAAR  Stopped by: Kennard Pea   sildenafil  20 MG tablet Commonly known as: REVATIO  Stopped by: Kennard Pea   traMADol  50 MG tablet Commonly known as: ULTRAM  Stopped by: Kennard Pea       TAKE these medications    albuterol  108 (90 Base) MCG/ACT inhaler Commonly known as: VENTOLIN  HFA Inhale 1-2 puffs into the lungs every 6 (six) hours as needed for wheezing or shortness of breath.   ALPRAZolam  1 MG tablet Commonly known as: XANAX  TAKE 1 TABLET BY MOUTH THREE TIMES A DAY AS NEEDED FOR ANXIETY   aspirin  EC  81 MG tablet Take 162 mg by mouth at bedtime. Swallow whole.   nitroGLYCERIN  0.4 MG SL tablet Commonly known as: NITROSTAT  Place 1 tablet (0.4 mg total) under the tongue every 5 (five) minutes as needed for chest pain.   VISINE OP Apply 1 drop to eye as needed (itchy, dryness).        Allergies:  Allergies  Allergen Reactions   Hydroxyzine  Pamoate Hives and Itching   Plavix  [Clopidogrel  Bisulfate] Other (See Comments)    Confusion, lethargy, slurred speech   Afrin [Nasal Spray] Hives   Antihistamines, Chlorpheniramine-Type Hives   Benadryl  [Diphenhydramine  Hcl] Hives   Buprenorphine Hcl Hives   Carisoprodol Hives   Codeine Hives   Metoclopramide Hives   Morphine  And Codeine Hives   Oxymetazoline  Hives   Pheniramine Hives and Rash    Past Medical History, Surgical history, Social history, and Family History were reviewed and updated.  Review of Systems: All other 10 point review of systems is negative.   Physical Exam:  height is 6\' 5"  (1.956 m) and weight is 182 lb 12.8 oz (82.9 kg). His oral temperature is 97.6 F (36.4 C). His blood pressure is 148/87 (abnormal) and his pulse is 54 (abnormal). His respiration is 18 and oxygen saturation is 98%.   Wt Readings from Last 3 Encounters:  05/31/23 182 lb 12.8 oz (82.9 kg)  05/05/23 193 lb (87.5 kg)  12/09/22 196 lb 1.9 oz (89 kg)    Ocular: Sclerae unicteric, pupils equal, round and reactive to light Ear-nose-throat: Oropharynx clear, dentition fair Lymphatic: No cervical or supraclavicular adenopathy Lungs no rales or rhonchi, good excursion bilaterally Heart regular rate and rhythm, no murmur appreciated Abd soft, nontender, positive bowel sounds MSK no focal spinal tenderness, no joint edema Neuro: non-focal, well-oriented, appropriate affect Breasts: Deferred   Lab Results  Component Value Date   WBC 7.3 05/31/2023   HGB 17.2 (H) 05/31/2023   HCT 51.2 05/31/2023   MCV 88.7 05/31/2023   PLT 225 05/31/2023   Lab Results  Component Value Date   FERRITIN 14 (L) 05/05/2023   IRON 133 05/05/2023   TIBC 536 (H) 05/05/2023   UIBC 403 (H) 05/05/2023   IRONPCTSAT 25 05/05/2023   Lab Results  Component Value Date   RETICCTPCT 1.8 04/22/2022   RBC 5.77 05/31/2023   No results found for: "KPAFRELGTCHN", "LAMBDASER", "KAPLAMBRATIO" No results found for: "IGGSERUM", "IGA", "IGMSERUM" No results found for: "TOTALPROTELP", "ALBUMINELP", "A1GS", "A2GS", "BETS", "BETA2SER", "GAMS", "MSPIKE", "SPEI"   Chemistry      Component Value Date/Time   NA 140 05/05/2023 1030   NA 143 01/01/2017 1453   NA 139 11/12/2015 0945   K 4.2 05/05/2023 1030   K 4.3 01/01/2017 1453   K 3.7 11/12/2015 0945   CL 103 05/05/2023 1030   CL  103 01/01/2017 1453   CO2 29 05/05/2023 1030   CO2 27 01/01/2017 1453   CO2 22 11/12/2015 0945   BUN 14 05/05/2023 1030   BUN 18 01/01/2017 1453   BUN 13.1 11/12/2015 0945   CREATININE 0.93 05/05/2023 1030   CREATININE 0.96 10/05/2019 1042   CREATININE 0.9 11/12/2015 0945      Component Value Date/Time   CALCIUM  9.2 05/05/2023 1030   CALCIUM  8.8 01/01/2017 1453   CALCIUM  8.9 11/12/2015 0945   ALKPHOS 63 05/05/2023 1030   ALKPHOS 44 01/01/2017 1453   ALKPHOS 66 11/12/2015 0945   AST 16 05/05/2023 1030   AST 13 11/12/2015 0945   ALT  14 05/05/2023 1030   ALT 368 (HH) 01/01/2017 1453   ALT 14 11/12/2015 0945   BILITOT 0.7 05/05/2023 1030   BILITOT <0.22 11/12/2015 0945       Impression and Plan: Mr. Cobaugh is a 53 yo caucasian male with history of secondary polycythemia, JAK2 negative; smoker.   We will proceed with phlebotomy for Hct 51.2.  He will continue taking his daily aspirin .  Social work consult placed by PCP.  Follow-up in 1 month.   Kennard Pea, NP 5/5/20251:35 PM

## 2023-06-01 ENCOUNTER — Other Ambulatory Visit: Payer: Self-pay

## 2023-06-01 NOTE — Progress Notes (Signed)
Transportation referral placed

## 2023-06-04 ENCOUNTER — Other Ambulatory Visit: Payer: Self-pay

## 2023-06-04 ENCOUNTER — Other Ambulatory Visit: Payer: Self-pay | Admitting: Licensed Clinical Social Worker

## 2023-06-04 ENCOUNTER — Inpatient Hospital Stay: Payer: Self-pay

## 2023-06-04 ENCOUNTER — Emergency Department (HOSPITAL_BASED_OUTPATIENT_CLINIC_OR_DEPARTMENT_OTHER)
Admission: EM | Admit: 2023-06-04 | Discharge: 2023-06-04 | Disposition: A | Discharge status: Home / Self Care | Payer: Self-pay

## 2023-06-04 ENCOUNTER — Inpatient Hospital Stay: Payer: Self-pay | Admitting: Family

## 2023-06-04 ENCOUNTER — Emergency Department (HOSPITAL_BASED_OUTPATIENT_CLINIC_OR_DEPARTMENT_OTHER): Payer: Self-pay

## 2023-06-04 ENCOUNTER — Encounter (HOSPITAL_BASED_OUTPATIENT_CLINIC_OR_DEPARTMENT_OTHER): Payer: Self-pay

## 2023-06-04 DIAGNOSIS — S93401A Sprain of unspecified ligament of right ankle, initial encounter: Secondary | ICD-10-CM | POA: Insufficient documentation

## 2023-06-04 DIAGNOSIS — Z7982 Long term (current) use of aspirin: Secondary | ICD-10-CM | POA: Insufficient documentation

## 2023-06-04 DIAGNOSIS — J449 Chronic obstructive pulmonary disease, unspecified: Secondary | ICD-10-CM | POA: Insufficient documentation

## 2023-06-04 DIAGNOSIS — X501XXA Overexertion from prolonged static or awkward postures, initial encounter: Secondary | ICD-10-CM | POA: Insufficient documentation

## 2023-06-04 MED ORDER — OXYCODONE HCL 5 MG PO TABS
5.0000 mg | ORAL_TABLET | Freq: Once | ORAL | Status: AC
  Administered 2023-06-04: 5 mg via ORAL
  Filled 2023-06-04: qty 1

## 2023-06-04 NOTE — Discharge Instructions (Signed)
 Imaging today was reassuring.  Please follow-up with your primary care provider.  Seek emergency care experiencing any new or worsening symptoms.  Alternating between 650 mg Tylenol  and 400 mg Advil : The best way to alternate taking Acetaminophen  (example Tylenol ) and Ibuprofen  (example Advil /Motrin ) is to take them 3 hours apart. For example, if you take ibuprofen  at 6 am you can then take Tylenol  at 9 am. You can continue this regimen throughout the day, making sure you do not exceed the recommended maximum dose for each drug.

## 2023-06-04 NOTE — Patient Outreach (Signed)
 Called pt twice left voicemail both times requesting callback

## 2023-06-04 NOTE — ED Provider Notes (Signed)
 [place] [facility] Provider Note   CSN: [id number] Arrival date & time: 06/04/23  1353     History  Chief Complaint  Patient presents with   Ankle Injury    Right     Back Pain    Stuart Hill is a 53 y.o. male with PMHx anxiety, OA, bells palsy, COPD, HLD, chronic back pain who presents to ED concerned for right ankle pain. Patient tripped and rolled his ankle and is now having difficulties walking. Patient denies falling, but does state that twisting his ankle caused a flare of his chronic low back pain. Patient has not taken any OTC medications for his pain today - he endorses using ice and cannabis for the pain.   Ankle Injury  Back Pain      Home Medications Prior to Admission medications   Medication Sig Start Date End Date Taking? Authorizing Provider  albuterol  (VENTOLIN  HFA) 108 (90 Base) MCG/ACT inhaler Inhale 1-2 puffs into the lungs every 6 (six) hours as needed for wheezing or shortness of breath. 07/23/22   Copland, Skipper Dumas, MD  ALPRAZolam  (XANAX ) 1 MG tablet TAKE 1 TABLET BY MOUTH THREE TIMES A DAY AS NEEDED FOR ANXIETY 03/19/23   Copland, Skipper Dumas, MD  aspirin  EC 81 MG tablet Take 162 mg by mouth at bedtime. Swallow whole.    [provider]  nitroGLYCERIN  (NITROSTAT ) 0.4 MG SL tablet Place 1 tablet (0.4 mg total) under the tongue every 5 (five) minutes as needed for chest pain. Patient not taking: Reported on 05/31/2023 05/09/23   Copland, Skipper Dumas, MD  Tetrahydrozoline HCl (VISINE OP) Apply 1 drop to eye as needed (itchy, dryness). Patient not taking: Reported on 05/05/2023    [provider]      Allergies    Hydroxyzine  pamoate; Plavix  [clopidogrel  bisulfate]; Afrin [nasal spray]; Antihistamines, chlorpheniramine-type; Benadryl  [diphenhydramine  hcl]; Buprenorphine hcl; Carisoprodol; Codeine; Metoclopramide; Morphine  and codeine; Oxymetazoline; and Pheniramine    Review of Systems   Review of  Systems  Musculoskeletal:        Ankle pain    Physical Exam Updated Vital Signs BP (!) 129/91 (BP Location: Right Arm)   Pulse 67   Temp 98.5 F (36.9 C) (Oral)   Resp 20   Ht 6\' 5"  (1.956 m)   Wt 82.9 kg   SpO2 99%   BMI 21.67 kg/m  Physical Exam Vitals and nursing note reviewed.  Constitutional:      General: He is not in acute distress.    Appearance: He is not ill-appearing or toxic-appearing.  HENT:     Head: Normocephalic and atraumatic.  Eyes:     General: No scleral icterus.       Right eye: No discharge.        Left eye: No discharge.     Conjunctiva/sclera: Conjunctivae normal.  Cardiovascular:     Rate and Rhythm: Normal rate.  Pulmonary:     Effort: Pulmonary effort is normal.  Abdominal:     General: Abdomen is flat.  Musculoskeletal:     Comments: Right ankle: no swelling appreciated. +2 pedal pulse. Sensation to light touch intact. Area non-tense.  Skin:    General: Skin is warm and dry.  Neurological:     General: No focal deficit present.     Mental Status: He is alert. Mental status is at baseline.  Psychiatric:        Mood and Affect: Mood normal.  Behavior: Behavior normal.     ED Results / Procedures / Treatments   Labs (all labs ordered are listed, but only abnormal results are displayed) Labs Reviewed - No data to display  EKG None  Radiology DG Foot Complete Right Result Date: 06/04/2023 CLINICAL DATA:  287523 Foot pain, right [id number]; ankle pain right. EXAM: RIGHT FOOT COMPLETE - 3+ VIEW; RIGHT ANKLE - COMPLETE 3+ VIEW COMPARISON:  None Available. FINDINGS: No acute fracture or dislocation. No aggressive osseous lesion. Ankle mortise appears intact. No significant degenerative changes of imaged joints. Calcaneal spur noted along the Achilles tendon and Plantar aponeurosis attachment sites. No focal soft tissue swelling. No radiopaque foreign bodies. IMPRESSION: No acute osseous abnormality of the right ankle and foot.  Electronically Signed   By: Beula Brunswick M.D.   On: 06/04/2023 15:35   DG Ankle Complete Right Result Date: 06/04/2023 CLINICAL DATA:  287523 Foot pain, right [id number]; ankle pain right. EXAM: RIGHT FOOT COMPLETE - 3+ VIEW; RIGHT ANKLE - COMPLETE 3+ VIEW COMPARISON:  None Available. FINDINGS: No acute fracture or dislocation. No aggressive osseous lesion. Ankle mortise appears intact. No significant degenerative changes of imaged joints. Calcaneal spur noted along the Achilles tendon and Plantar aponeurosis attachment sites. No focal soft tissue swelling. No radiopaque foreign bodies. IMPRESSION: No acute osseous abnormality of the right ankle and foot. Electronically Signed   By: Beula Brunswick M.D.   On: 06/04/2023 15:35    Procedures Procedures    Medications Ordered in ED Medications  oxyCODONE (Oxy IR/ROXICODONE) immediate release tablet 5 mg (5 mg Oral Given 06/04/23 1538)    ED Course/ Medical Decision Making/ A&P                                 Medical Decision Making Amount and/or Complexity of Data Reviewed Radiology: ordered.   This patient presents to the ED for concern of ankle pain, this involves an extensive number of treatment options, and is a complaint that carries with it a high risk of complications and morbidity.  The differential diagnosis includes hemarthrosis, gout, septic joint, fracture, tendonitis, carpal tunnel syndrome, muscle strain, bursitis, compartment syndrome   Co morbidities that complicate the patient evaluation  anxiety, OA, bells palsy, COPD, HLD, chronic back pain    Additional history obtained:  Dr. Geralyn Knee PCP   Problem List / ED Course / Critical interventions / Medication management  Patient presents to ED concerned for right ankle pain.  Physical exam reassuring.  Patient afebrile with stable vitals. I ordered imaging studies including right foot/ankle xray. I independently visualized and interpreted imaging. I agree with the  radiologist interpretation of no acute process.  Shared results with patient.  Answered all questions.  Educated patient on alternating Advil  and Tylenol  for pain management.  Provided patient with ankle brace.  Recommended following up with PCP. I have reviewed the patients home medicines and have made adjustments as needed The patient has been appropriately medically screened and/or stabilized in the ED. I have low suspicion for any other emergent medical condition which would require further screening, evaluation or treatment in the ED or require inpatient management. At time of discharge the patient is hemodynamically stable and in no acute distress. I have discussed work-up results and diagnosis with patient and answered all questions. Patient is agreeable with discharge plan. We discussed strict return precautions for returning to the emergency department and they verbalized understanding.  Ddx these are considered less likely due to history of present illness and physical exam -hemarthrosis: joint without swelling; ROM intact -gout: no warmth or erythema; ROM intact  -septic joint: afebrile; no warmth or erythema; no skin changes; ROM intact  -fracture: xray without concern  -compartment syndrome: area not tense; neurovascularly intact   Social Determinants of Health:  none         Final Clinical Impression(s) / ED Diagnoses Final diagnoses:  Sprain of right ankle, unspecified ligament, initial encounter    Rx / DC Orders ED Discharge Orders     None         [place], [place] 06/04/23 1552    Rolinda Climes, DO 06/05/23 1436

## 2023-06-04 NOTE — ED Triage Notes (Addendum)
 Patient arrives ambulatory to the ED with complaints of right ankle pain. Patient rolled his ankle yesterday and he is now having issues with ambulation. Rates pain a 3/10. Patient also reports worsening chronic back pain.

## 2023-06-08 ENCOUNTER — Other Ambulatory Visit: Payer: Self-pay | Admitting: Family Medicine

## 2023-06-08 ENCOUNTER — Telehealth: Payer: Self-pay

## 2023-06-08 DIAGNOSIS — F411 Generalized anxiety disorder: Secondary | ICD-10-CM

## 2023-06-08 NOTE — Progress Notes (Unsigned)
 Complex Care Management Note Care Guide Note  06/08/2023 Name: Stuart Hill MRN: [id number] DOB: October 27, 1970   Complex Care Management Outreach Attempts: An unsuccessful telephone outreach was attempted today to offer the patient information about available complex care management services.  Follow Up Plan:  Additional outreach attempts will be made to offer the patient complex care management information and services.   Encounter Outcome:  No Answer  [facility]  [facility], [facility] Care Management Assistant Direct Dial: [phone]  Fax: [phone]

## 2023-06-10 NOTE — Progress Notes (Signed)
 Complex Care Management Note Care Guide Note  06/10/2023 Name: Stuart Hill MRN: [id number] DOB: Nov 30, 1970   Complex Care Management Outreach Attempts: An unsuccessful telephone outreach was attempted today to offer the patient information about available complex care management services.  Follow Up Plan:  No further outreach attempts will be made at this time. We have been unable to contact the patient to offer or enroll patient in complex care management services.  Encounter Outcome:  No Answer  [facility]  [facility], [facility] Care Management Assistant Direct Dial: [phone]  Fax: [phone]

## 2023-06-19 ENCOUNTER — Other Ambulatory Visit: Payer: Self-pay | Admitting: Family Medicine

## 2023-06-19 DIAGNOSIS — L509 Urticaria, unspecified: Secondary | ICD-10-CM

## 2023-06-29 ENCOUNTER — Inpatient Hospital Stay: Payer: Self-pay | Attending: Hematology & Oncology

## 2023-06-29 ENCOUNTER — Inpatient Hospital Stay: Payer: Self-pay | Admitting: Family

## 2023-06-29 ENCOUNTER — Encounter: Payer: Self-pay | Admitting: Family

## 2023-06-29 ENCOUNTER — Inpatient Hospital Stay: Payer: Self-pay

## 2023-06-29 VITALS — BP 146/90 | HR 70 | Temp 98.1°F | Resp 17 | Wt 187.2 lb

## 2023-06-29 DIAGNOSIS — D500 Iron deficiency anemia secondary to blood loss (chronic): Secondary | ICD-10-CM

## 2023-06-29 DIAGNOSIS — D751 Secondary polycythemia: Secondary | ICD-10-CM | POA: Insufficient documentation

## 2023-06-29 DIAGNOSIS — F17200 Nicotine dependence, unspecified, uncomplicated: Secondary | ICD-10-CM | POA: Insufficient documentation

## 2023-06-29 DIAGNOSIS — Z7982 Long term (current) use of aspirin: Secondary | ICD-10-CM | POA: Insufficient documentation

## 2023-06-29 LAB — CBC WITH DIFFERENTIAL (CANCER CENTER ONLY)
Abs Immature Granulocytes: 0.02 10*3/uL (ref 0.00–0.07)
Basophils Absolute: 0.1 10*3/uL (ref 0.0–0.1)
Basophils Relative: 1 %
Eosinophils Absolute: 0.1 10*3/uL (ref 0.0–0.5)
Eosinophils Relative: 1 %
HCT: 47.3 % (ref 39.0–52.0)
Hemoglobin: 15.8 g/dL (ref 13.0–17.0)
Immature Granulocytes: 0 %
Lymphocytes Relative: 16 %
Lymphs Abs: 1.5 10*3/uL (ref 0.7–4.0)
MCH: 29.6 pg (ref 26.0–34.0)
MCHC: 33.4 g/dL (ref 30.0–36.0)
MCV: 88.6 fL (ref 80.0–100.0)
Monocytes Absolute: 0.7 10*3/uL (ref 0.1–1.0)
Monocytes Relative: 8 %
Neutro Abs: 7 10*3/uL (ref 1.7–7.7)
Neutrophils Relative %: 74 %
Platelet Count: 282 10*3/uL (ref 150–400)
RBC: 5.34 MIL/uL (ref 4.22–5.81)
RDW: 13.8 % (ref 11.5–15.5)
WBC Count: 9.4 10*3/uL (ref 4.0–10.5)
nRBC: 0 % (ref 0.0–0.2)

## 2023-06-29 LAB — CMP (CANCER CENTER ONLY)
ALT: 15 U/L (ref 0–44)
AST: 14 U/L — ABNORMAL LOW (ref 15–41)
Albumin: 4.5 g/dL (ref 3.5–5.0)
Alkaline Phosphatase: 58 U/L (ref 38–126)
Anion gap: 8 (ref 5–15)
BUN: 15 mg/dL (ref 6–20)
CO2: 25 mmol/L (ref 22–32)
Calcium: 8.8 mg/dL — ABNORMAL LOW (ref 8.9–10.3)
Chloride: 104 mmol/L (ref 98–111)
Creatinine: 0.85 mg/dL (ref 0.61–1.24)
GFR, Estimated: >60 mL/min (ref >60)
Glucose, Bld: 156 mg/dL — ABNORMAL HIGH (ref 70–99)
Potassium: 4.1 mmol/L (ref 3.5–5.1)
Sodium: 137 mmol/L (ref 135–145)
Total Bilirubin: 0.3 mg/dL (ref 0.0–1.2)
Total Protein: 7.4 g/dL (ref 6.5–8.1)

## 2023-06-29 LAB — IRON AND IRON BINDING CAPACITY (CC-WL,HP ONLY)
Iron: 44 ug/dL — ABNORMAL LOW (ref 45–182)
Saturation Ratios: 9 % — ABNORMAL LOW (ref 17.9–39.5)
TIBC: 515 ug/dL — ABNORMAL HIGH (ref 250–450)
UIBC: 471 ug/dL — ABNORMAL HIGH (ref 117–376)

## 2023-06-29 LAB — FERRITIN: Ferritin: 14 ng/mL — ABNORMAL LOW (ref 24–336)

## 2023-06-29 NOTE — Patient Instructions (Signed)

## 2023-06-29 NOTE — Progress Notes (Deleted)
 [facility] [address] [place], [place] [address] [phone]  Date:  06/30/2023   Name:  Stuart Hill   DOB:  08-Oct-1970   MRN:  [id number]  PCP:  Kaylee Partridge, MD    Chief Complaint: No chief complaint on file.   History of Present Illness:  Stuart Hill is a 53 y.o. very pleasant male patient who presents with the following:  Pt seen today for medication recheck.  Most recent visit with myself was in October  He was seen by hematology recently  Principle Diagnosis:  Secondary polycythemia JAK2 (-), smoker  LEFT Bell's palsy Leukocytosis  Current Therapy:        Phlebotomy to maintain hematocrit below 45% 2 baby aspirin  daily at bedtime  Interim History:  [name] is here today for follow-up. He states that he is feeling better mentally. He is trying to connect with the Child psychotherapist for his primary care office to help with food and lodging assistance.  He also plans to make an appointment with PCP for routine follow-up and med refills. He is not currently taking any antihypertensives or his Xanax .  He has intermittentn chest discomfort, palpitations and occasional SOB with over exertion. No issues at this time.   He filled alprazolam  on 5/15 Patient Active Problem List   Diagnosis Date Noted   MDD (major depressive disorder), recurrent severe, without psychosis (HCC) 10/01/2021   Paroxysmal SVT (supraventricular tachycardia) (HCC)    Emphysema of lung (HCC)    Palpitations 01/09/2021   Minimal CAD 01/09/2021   Mixed hyperlipidemia 01/09/2021   Ascending aorta dilatation (HCC) 01/09/2021   Allergy 08/19/2020   Anxiety 08/19/2020   Arthritis 08/19/2020   Bell's palsy 08/19/2020   Chest pain 07/26/2020   Family history of early CAD 07/26/2020   DOE (dyspnea on exertion) 12/14/2019   Angina pectoris (HCC) 11/03/2019   Tobacco abuse 11/03/2019   Other hyperlipidemia 11/03/2019    Tonsillar hypertrophy 04/30/2017   Obstructive sleep apnea 04/23/2017   Polycythemia, secondary 09/29/2016   Polycythemia vera (HCC) 05/03/2015   Hemoptysis 04/03/2015   Condylomata acuminata 01/18/2015   Generalized anxiety disorder 03/05/2014    Past Medical History:  Diagnosis Date   Allergy    Anxiety    Arthritis    Bell's palsy    COPD (chronic obstructive pulmonary disease) (HCC)    Erythrocytosis 04/10/2015   Hyperlipidemia    Polycythemia vera (HCC) 05/03/2015    Past Surgical History:  Procedure Laterality Date   DENTAL SURGERY     HAND SURGERY     TONSILLECTOMY Bilateral 04/30/2017   Procedure: TONSILLECTOMY;  Surgeon: Ammon Bales, MD;  Location: [facility];  Service: ENT;  Laterality: Bilateral;    Social History   Tobacco Use   Smoking status: Every Day    Current packs/day: 2.00    Average packs/day: 2.0 packs/day for 24.0 years (48.0 ttl pk-yrs)    Types: Cigarettes   Smokeless tobacco: Never   Tobacco comments:    He is smoking 2 PPD.  Vaping Use   Vaping status: Never Used  Substance Use Topics   Alcohol  use: Yes    Alcohol /week: 1.0 standard drink of alcohol     Types: 1 Cans of beer per week    Comment: 6-10 beer a week   Drug use: Yes    Types: Other-see comments, Marijuana    Comment: Marijuana every other day.  Family History  Problem Relation Age of Onset   COPD Mother    Fibromyalgia Mother    Heart disease Father    Stroke Father    Depression Brother    Heart disease Brother    Heart disease Maternal Grandmother    Stroke Maternal Grandmother    Heart disease Maternal Grandfather    Stroke Maternal Grandfather    Heart disease Paternal Grandmother    Stroke Paternal Grandmother    Heart disease Paternal Grandfather    Stroke Paternal Grandfather    Stroke Paternal Aunt     Allergies  Allergen Reactions   Hydroxyzine  Pamoate Hives and Itching   Plavix  [Clopidogrel  Bisulfate] Other (See Comments)     Confusion, lethargy, slurred speech   Afrin [Nasal Spray] Hives   Antihistamines, Chlorpheniramine-Type Hives   Benadryl  [Diphenhydramine  Hcl] Hives   Buprenorphine Hcl Hives   Carisoprodol Hives   Codeine Hives   Metoclopramide Hives   Morphine  And Codeine Hives   Oxymetazoline Hives   Pheniramine Hives and Rash    Medication list has been reviewed and updated.  Current Outpatient Medications on File Prior to Visit  Medication Sig Dispense Refill   albuterol  (VENTOLIN  HFA) 108 (90 Base) MCG/ACT inhaler Inhale 1-2 puffs into the lungs every 6 (six) hours as needed for wheezing or shortness of breath. 36 g 5   ALPRAZolam  (XANAX ) 1 MG tablet TAKE 1 TABLET BY MOUTH THREE TIMES A DAY AS NEEDED FOR ANXIETY 90 tablet 0   aspirin  EC 81 MG tablet Take 162 mg by mouth at bedtime. Swallow whole.     nitroGLYCERIN  (NITROSTAT ) 0.4 MG SL tablet Place 1 tablet (0.4 mg total) under the tongue every 5 (five) minutes as needed for chest pain. 25 tablet 6   Tetrahydrozoline HCl (VISINE OP) Apply 1 drop to eye as needed (itchy, dryness). (Patient not taking: Reported on 06/29/2023)     No current facility-administered medications on file prior to visit.    Review of Systems:  As per HPI- otherwise negative.   Physical Examination: There were no vitals filed for this visit. There were no vitals filed for this visit. There is no height or weight on file to calculate BMI. Ideal Body Weight:    GEN: no acute distress. HEENT: Atraumatic, Normocephalic.  Ears and Nose: No external deformity. CV: RRR, No M/G/R. No JVD. No thrill. No extra heart sounds. PULM: CTA B, no wheezes, crackles, rhonchi. No retractions. No resp. distress. No accessory muscle use. ABD: S, NT, ND, +BS. No rebound. No HSM. EXTR: No c/c/e PSYCH: Normally interactive. Conversant.    Assessment and Plan: ***  Signed Gates Kasal, MD

## 2023-06-29 NOTE — Progress Notes (Signed)
 Stuart Hill presents today for phlebotomy per MD orders. Phlebotomy procedure started at 1100 and ended at 1115. 520 grams removed. Patient observed for 30 minutes after procedure without any incident. Patient tolerated procedure well. IV needle removed intact.   Pt declined to stay for 30 minute observation  period. VSS . Drank po fluids well. Steady noted upon discharged.

## 2023-06-29 NOTE — Progress Notes (Signed)
 Hematology and Oncology Follow Up Visit  Stuart Hill [id number] 13-Jun-1970 53 y.o. 06/29/2023   Principle Diagnosis:  Secondary polycythemia JAK2 (-), smoker  LEFT Bell's palsy Leukocytosis    Current Therapy:        Phlebotomy to maintain hematocrit below 45% 2 baby aspirin  daily at bedtime    Interim History:  Stuart Hill is here today for follow-up. He states that he is feeling better mentally. He is trying to connect with the Child psychotherapist for his primary care office to help with food and lodging assistance.  He also plans to make an appointment with PCP for routine follow-up and med refills. He is not currently taking any antihypertensives or his Xanax .  He has intermittentn chest discomfort, palpitations and occasional SOB with over exertion. No issues at this time.  No fever, chills, n/v, cough, rash, dizziness, abdominal pain or changes in bowel or bladder habits.  He twisted his right ankle last month and this is still sore but he notes that the pain and swelling are much better.  No falls or syncope. No neuropathy.  Appetite and hydration are fair. Weight is stable at 187 lbs.   ECOG Performance Status: 1 - Symptomatic but completely ambulatory  Medications:  Allergies as of 06/29/2023       Reactions   Hydroxyzine  Pamoate Hives, Itching   Plavix  [clopidogrel  Bisulfate] Other (See Comments)   Confusion, lethargy, slurred speech   Afrin [nasal Spray] Hives   Antihistamines, Chlorpheniramine-type Hives   Benadryl  [diphenhydramine  Hcl] Hives   Buprenorphine Hcl Hives   Carisoprodol Hives   Codeine Hives   Metoclopramide Hives   Morphine  And Codeine Hives   Oxymetazoline Hives   Pheniramine Hives, Rash        Medication List        Accurate as of June 29, 2023 10:50 AM. If you have any questions, ask your nurse or doctor.          albuterol  108 (90 Base) MCG/ACT inhaler Commonly known as: VENTOLIN  HFA Inhale 1-2 puffs into the lungs every 6 (six)  hours as needed for wheezing or shortness of breath.   ALPRAZolam  1 MG tablet Commonly known as: XANAX  TAKE 1 TABLET BY MOUTH THREE TIMES A DAY AS NEEDED FOR ANXIETY   aspirin  EC 81 MG tablet Take 162 mg by mouth at bedtime. Swallow whole.   nitroGLYCERIN  0.4 MG SL tablet Commonly known as: NITROSTAT  Place 1 tablet (0.4 mg total) under the tongue every 5 (five) minutes as needed for chest pain.   VISINE OP Apply 1 drop to eye as needed (itchy, dryness).        Allergies:  Allergies  Allergen Reactions   Hydroxyzine  Pamoate Hives and Itching   Plavix  [Clopidogrel  Bisulfate] Other (See Comments)    Confusion, lethargy, slurred speech   Afrin [Nasal Spray] Hives   Antihistamines, Chlorpheniramine-Type Hives   Benadryl  [Diphenhydramine  Hcl] Hives   Buprenorphine Hcl Hives   Carisoprodol Hives   Codeine Hives   Metoclopramide Hives   Morphine  And Codeine Hives   Oxymetazoline Hives   Pheniramine Hives and Rash    Past Medical History, Surgical history, Social history, and Family History were reviewed and updated.  Review of Systems: All other 10 point review of systems is negative.   Physical Exam:  weight is 187 lb 3.2 oz (84.9 kg). His oral temperature is 98.1 F (36.7 C). His blood pressure is 146/90 (abnormal) and his pulse is 70. His respiration is 17 and  oxygen saturation is 100%.   Wt Readings from Last 3 Encounters:  06/29/23 187 lb 3.2 oz (84.9 kg)  06/04/23 182 lb 12.2 oz (82.9 kg)  05/31/23 182 lb 12.8 oz (82.9 kg)    Ocular: Sclerae unicteric, pupils equal, round and reactive to light Ear-nose-throat: Oropharynx clear, dentition fair Lymphatic: No cervical or supraclavicular adenopathy Lungs no rales or rhonchi, good excursion bilaterally Heart regular rate and rhythm, no murmur appreciated Abd soft, nontender, positive bowel sounds MSK no focal spinal tenderness, no joint edema Neuro: non-focal, well-oriented, appropriate affect Breasts: Deferred    Lab Results  Component Value Date   WBC 9.4 06/29/2023   HGB 15.8 06/29/2023   HCT 47.3 06/29/2023   MCV 88.6 06/29/2023   PLT 282 06/29/2023   Lab Results  Component Value Date   FERRITIN 26 05/31/2023   IRON 114 05/31/2023   TIBC 454 (H) 05/31/2023   UIBC 340 05/31/2023   IRONPCTSAT 25 05/31/2023   Lab Results  Component Value Date   RETICCTPCT 1.8 04/22/2022   RBC 5.34 06/29/2023   No results found for: "KPAFRELGTCHN", "LAMBDASER", "KAPLAMBRATIO" No results found for: "IGGSERUM", "IGA", "IGMSERUM" No results found for: "TOTALPROTELP", "ALBUMINELP", "A1GS", "A2GS", "BETS", "BETA2SER", "GAMS", "MSPIKE", "SPEI"   Chemistry      Component Value Date/Time   NA 138 05/31/2023 1255   NA 143 01/01/2017 1453   NA 139 11/12/2015 0945   K 4.4 05/31/2023 1255   K 4.3 01/01/2017 1453   K 3.7 11/12/2015 0945   CL 103 05/31/2023 1255   CL 103 01/01/2017 1453   CO2 29 05/31/2023 1255   CO2 27 01/01/2017 1453   CO2 22 11/12/2015 0945   BUN 9 05/31/2023 1255   BUN 18 01/01/2017 1453   BUN 13.1 11/12/2015 0945   CREATININE 0.98 05/31/2023 1255   CREATININE 0.96 10/05/2019 1042   CREATININE 0.9 11/12/2015 0945      Component Value Date/Time   CALCIUM  9.4 05/31/2023 1255   CALCIUM  8.8 01/01/2017 1453   CALCIUM  8.9 11/12/2015 0945   ALKPHOS 53 05/31/2023 1255   ALKPHOS 44 01/01/2017 1453   ALKPHOS 66 11/12/2015 0945   AST 15 05/31/2023 1255   AST 13 11/12/2015 0945   ALT 10 05/31/2023 1255   ALT 368 (HH) 01/01/2017 1453   ALT 14 11/12/2015 0945   BILITOT 0.8 05/31/2023 1255   BILITOT <0.22 11/12/2015 0945       Impression and Plan: Stuart Hill is a 53 yo caucasian male with history of secondary polycythemia, JAK2 negative; smoker.   We will proceed with phlebotomy for Hct 47.3. He will continue taking his 2 baby aspirin  daily.  Follow-up in 1 month.   Kennard Pea, NP 6/3/202510:50 AM

## 2023-06-30 ENCOUNTER — Ambulatory Visit: Payer: Self-pay | Admitting: Family Medicine

## 2023-07-04 NOTE — Progress Notes (Deleted)
 [facility] [address] [place], [place] [address] [phone]  Date:  06/30/2023   Name:  Stuart Hill   DOB:  08-Oct-1970   MRN:  [id number]  PCP:  Kaylee Partridge, MD    Chief Complaint: No chief complaint on file.   History of Present Illness:  Stuart Hill is a 53 y.o. very pleasant male patient who presents with the following:  Pt seen today for medication recheck.  Most recent visit with myself was in October  He was seen by hematology recently  Principle Diagnosis:  Secondary polycythemia JAK2 (-), smoker  LEFT Bell's palsy Leukocytosis  Current Therapy:        Phlebotomy to maintain hematocrit below 45% 2 baby aspirin  daily at bedtime  Interim History:  Stuart Hill is here today for follow-up. He states that he is feeling better mentally. He is trying to connect with the Child psychotherapist for his primary care office to help with food and lodging assistance.  He also plans to make an appointment with PCP for routine follow-up and med refills. He is not currently taking any antihypertensives or his Xanax .  He has intermittentn chest discomfort, palpitations and occasional SOB with over exertion. No issues at this time.   He filled alprazolam  on 5/15 Patient Active Problem List   Diagnosis Date Noted   MDD (major depressive disorder), recurrent severe, without psychosis (HCC) 10/01/2021   Paroxysmal SVT (supraventricular tachycardia) (HCC)    Emphysema of lung (HCC)    Palpitations 01/09/2021   Minimal CAD 01/09/2021   Mixed hyperlipidemia 01/09/2021   Ascending aorta dilatation (HCC) 01/09/2021   Allergy 08/19/2020   Anxiety 08/19/2020   Arthritis 08/19/2020   Bell's palsy 08/19/2020   Chest pain 07/26/2020   Family history of early CAD 07/26/2020   DOE (dyspnea on exertion) 12/14/2019   Angina pectoris (HCC) 11/03/2019   Tobacco abuse 11/03/2019   Other hyperlipidemia 11/03/2019    Tonsillar hypertrophy 04/30/2017   Obstructive sleep apnea 04/23/2017   Polycythemia, secondary 09/29/2016   Polycythemia vera (HCC) 05/03/2015   Hemoptysis 04/03/2015   Condylomata acuminata 01/18/2015   Generalized anxiety disorder 03/05/2014    Past Medical History:  Diagnosis Date   Allergy    Anxiety    Arthritis    Bell's palsy    COPD (chronic obstructive pulmonary disease) (HCC)    Erythrocytosis 04/10/2015   Hyperlipidemia    Polycythemia vera (HCC) 05/03/2015    Past Surgical History:  Procedure Laterality Date   DENTAL SURGERY     HAND SURGERY     TONSILLECTOMY Bilateral 04/30/2017   Procedure: TONSILLECTOMY;  Surgeon: Ammon Bales, MD;  Location: [facility];  Service: ENT;  Laterality: Bilateral;    Social History   Tobacco Use   Smoking status: Every Day    Current packs/day: 2.00    Average packs/day: 2.0 packs/day for 24.0 years (48.0 ttl pk-yrs)    Types: Cigarettes   Smokeless tobacco: Never   Tobacco comments:    He is smoking 2 PPD.  Vaping Use   Vaping status: Never Used  Substance Use Topics   Alcohol  use: Yes    Alcohol /week: 1.0 standard drink of alcohol     Types: 1 Cans of beer per week    Comment: 6-10 beer a week   Drug use: Yes    Types: Other-see comments, Marijuana    Comment: Marijuana every other day.  Family History  Problem Relation Age of Onset   COPD Mother    Fibromyalgia Mother    Heart disease Father    Stroke Father    Depression Brother    Heart disease Brother    Heart disease Maternal Grandmother    Stroke Maternal Grandmother    Heart disease Maternal Grandfather    Stroke Maternal Grandfather    Heart disease Paternal Grandmother    Stroke Paternal Grandmother    Heart disease Paternal Grandfather    Stroke Paternal Grandfather    Stroke Paternal Aunt     Allergies  Allergen Reactions   Hydroxyzine  Pamoate Hives and Itching   Plavix  [Clopidogrel  Bisulfate] Other (See Comments)     Confusion, lethargy, slurred speech   Afrin [Nasal Spray] Hives   Antihistamines, Chlorpheniramine-Type Hives   Benadryl  [Diphenhydramine  Hcl] Hives   Buprenorphine Hcl Hives   Carisoprodol Hives   Codeine Hives   Metoclopramide Hives   Morphine  And Codeine Hives   Oxymetazoline Hives   Pheniramine Hives and Rash    Medication list has been reviewed and updated.  Current Outpatient Medications on File Prior to Visit  Medication Sig Dispense Refill   albuterol  (VENTOLIN  HFA) 108 (90 Base) MCG/ACT inhaler Inhale 1-2 puffs into the lungs every 6 (six) hours as needed for wheezing or shortness of breath. 36 g 5   ALPRAZolam  (XANAX ) 1 MG tablet TAKE 1 TABLET BY MOUTH THREE TIMES A DAY AS NEEDED FOR ANXIETY 90 tablet 0   aspirin  EC 81 MG tablet Take 162 mg by mouth at bedtime. Swallow whole.     nitroGLYCERIN  (NITROSTAT ) 0.4 MG SL tablet Place 1 tablet (0.4 mg total) under the tongue every 5 (five) minutes as needed for chest pain. 25 tablet 6   Tetrahydrozoline HCl (VISINE OP) Apply 1 drop to eye as needed (itchy, dryness). (Patient not taking: Reported on 06/29/2023)     No current facility-administered medications on file prior to visit.    Review of Systems:  As per HPI- otherwise negative.   Physical Examination: There were no vitals filed for this visit. There were no vitals filed for this visit. There is no height or weight on file to calculate BMI. Ideal Body Weight:    GEN: no acute distress. HEENT: Atraumatic, Normocephalic.  Ears and Nose: No external deformity. CV: RRR, No M/G/R. No JVD. No thrill. No extra heart sounds. PULM: CTA B, no wheezes, crackles, rhonchi. No retractions. No resp. distress. No accessory muscle use. ABD: S, NT, ND, +BS. No rebound. No HSM. EXTR: No c/c/e PSYCH: Normally interactive. Conversant.    Assessment and Plan: ***  Signed Gates Kasal, MD

## 2023-07-07 ENCOUNTER — Ambulatory Visit: Payer: Self-pay | Admitting: Family Medicine

## 2023-07-12 NOTE — Progress Notes (Deleted)
 [facility] [address] [place], [place] [address] [phone]  Date:  06/30/2023   Name:  Stuart Hill   DOB:  08-Oct-1970   MRN:  [id number]  PCP:  Kaylee Partridge, MD    Chief Complaint: No chief complaint on file.   History of Present Illness:  Stuart Hill is a 53 y.o. very pleasant male patient who presents with the following:  Pt seen today for medication recheck.  Most recent visit with myself was in October  He was seen by hematology recently  Principle Diagnosis:  Secondary polycythemia JAK2 (-), smoker  LEFT Bell's palsy Leukocytosis  Current Therapy:        Phlebotomy to maintain hematocrit below 45% 2 baby aspirin  daily at bedtime  Interim History:  [name] is here today for follow-up. He states that he is feeling better mentally. He is trying to connect with the Child psychotherapist for his primary care office to help with food and lodging assistance.  He also plans to make an appointment with PCP for routine follow-up and med refills. He is not currently taking any antihypertensives or his Xanax .  He has intermittentn chest discomfort, palpitations and occasional SOB with over exertion. No issues at this time.   He filled alprazolam  on 5/15 Patient Active Problem List   Diagnosis Date Noted   MDD (major depressive disorder), recurrent severe, without psychosis (HCC) 10/01/2021   Paroxysmal SVT (supraventricular tachycardia) (HCC)    Emphysema of lung (HCC)    Palpitations 01/09/2021   Minimal CAD 01/09/2021   Mixed hyperlipidemia 01/09/2021   Ascending aorta dilatation (HCC) 01/09/2021   Allergy 08/19/2020   Anxiety 08/19/2020   Arthritis 08/19/2020   Bell's palsy 08/19/2020   Chest pain 07/26/2020   Family history of early CAD 07/26/2020   DOE (dyspnea on exertion) 12/14/2019   Angina pectoris (HCC) 11/03/2019   Tobacco abuse 11/03/2019   Other hyperlipidemia 11/03/2019    Tonsillar hypertrophy 04/30/2017   Obstructive sleep apnea 04/23/2017   Polycythemia, secondary 09/29/2016   Polycythemia vera (HCC) 05/03/2015   Hemoptysis 04/03/2015   Condylomata acuminata 01/18/2015   Generalized anxiety disorder 03/05/2014    Past Medical History:  Diagnosis Date   Allergy    Anxiety    Arthritis    Bell's palsy    COPD (chronic obstructive pulmonary disease) (HCC)    Erythrocytosis 04/10/2015   Hyperlipidemia    Polycythemia vera (HCC) 05/03/2015    Past Surgical History:  Procedure Laterality Date   DENTAL SURGERY     HAND SURGERY     TONSILLECTOMY Bilateral 04/30/2017   Procedure: TONSILLECTOMY;  Surgeon: Ammon Bales, MD;  Location: [facility];  Service: ENT;  Laterality: Bilateral;    Social History   Tobacco Use   Smoking status: Every Day    Current packs/day: 2.00    Average packs/day: 2.0 packs/day for 24.0 years (48.0 ttl pk-yrs)    Types: Cigarettes   Smokeless tobacco: Never   Tobacco comments:    He is smoking 2 PPD.  Vaping Use   Vaping status: Never Used  Substance Use Topics   Alcohol  use: Yes    Alcohol /week: 1.0 standard drink of alcohol     Types: 1 Cans of beer per week    Comment: 6-10 beer a week   Drug use: Yes    Types: Other-see comments, Marijuana    Comment: Marijuana every other day.  Family History  Problem Relation Age of Onset   COPD Mother    Fibromyalgia Mother    Heart disease Father    Stroke Father    Depression Brother    Heart disease Brother    Heart disease Maternal Grandmother    Stroke Maternal Grandmother    Heart disease Maternal Grandfather    Stroke Maternal Grandfather    Heart disease Paternal Grandmother    Stroke Paternal Grandmother    Heart disease Paternal Grandfather    Stroke Paternal Grandfather    Stroke Paternal Aunt     Allergies  Allergen Reactions   Hydroxyzine  Pamoate Hives and Itching   Plavix  [Clopidogrel  Bisulfate] Other (See Comments)     Confusion, lethargy, slurred speech   Afrin [Nasal Spray] Hives   Antihistamines, Chlorpheniramine-Type Hives   Benadryl  [Diphenhydramine  Hcl] Hives   Buprenorphine Hcl Hives   Carisoprodol Hives   Codeine Hives   Metoclopramide Hives   Morphine  And Codeine Hives   Oxymetazoline Hives   Pheniramine Hives and Rash    Medication list has been reviewed and updated.  Current Outpatient Medications on File Prior to Visit  Medication Sig Dispense Refill   albuterol  (VENTOLIN  HFA) 108 (90 Base) MCG/ACT inhaler Inhale 1-2 puffs into the lungs every 6 (six) hours as needed for wheezing or shortness of breath. 36 g 5   ALPRAZolam  (XANAX ) 1 MG tablet TAKE 1 TABLET BY MOUTH THREE TIMES A DAY AS NEEDED FOR ANXIETY 90 tablet 0   aspirin  EC 81 MG tablet Take 162 mg by mouth at bedtime. Swallow whole.     nitroGLYCERIN  (NITROSTAT ) 0.4 MG SL tablet Place 1 tablet (0.4 mg total) under the tongue every 5 (five) minutes as needed for chest pain. 25 tablet 6   Tetrahydrozoline HCl (VISINE OP) Apply 1 drop to eye as needed (itchy, dryness). (Patient not taking: Reported on 06/29/2023)     No current facility-administered medications on file prior to visit.    Review of Systems:  As per HPI- otherwise negative.   Physical Examination: There were no vitals filed for this visit. There were no vitals filed for this visit. There is no height or weight on file to calculate BMI. Ideal Body Weight:    GEN: no acute distress. HEENT: Atraumatic, Normocephalic.  Ears and Nose: No external deformity. CV: RRR, No M/G/R. No JVD. No thrill. No extra heart sounds. PULM: CTA B, no wheezes, crackles, rhonchi. No retractions. No resp. distress. No accessory muscle use. ABD: S, NT, ND, +BS. No rebound. No HSM. EXTR: No c/c/e PSYCH: Normally interactive. Conversant.    Assessment and Plan: ***  Signed Gates Kasal, MD

## 2023-07-19 ENCOUNTER — Ambulatory Visit: Payer: Self-pay | Admitting: Family Medicine

## 2023-07-27 ENCOUNTER — Other Ambulatory Visit: Payer: Self-pay

## 2023-07-27 ENCOUNTER — Ambulatory Visit: Payer: Self-pay | Admitting: Family

## 2023-08-02 ENCOUNTER — Inpatient Hospital Stay: Payer: Self-pay | Attending: Hematology & Oncology

## 2023-08-02 ENCOUNTER — Inpatient Hospital Stay (HOSPITAL_BASED_OUTPATIENT_CLINIC_OR_DEPARTMENT_OTHER): Payer: Self-pay | Admitting: Family

## 2023-08-02 ENCOUNTER — Encounter: Payer: Self-pay | Admitting: Family

## 2023-08-02 VITALS — BP 136/78 | HR 58 | Temp 97.8°F | Resp 18 | Ht 77.0 in | Wt 192.8 lb

## 2023-08-02 DIAGNOSIS — Z7982 Long term (current) use of aspirin: Secondary | ICD-10-CM | POA: Insufficient documentation

## 2023-08-02 DIAGNOSIS — G510 Bell's palsy: Secondary | ICD-10-CM | POA: Insufficient documentation

## 2023-08-02 DIAGNOSIS — D500 Iron deficiency anemia secondary to blood loss (chronic): Secondary | ICD-10-CM

## 2023-08-02 DIAGNOSIS — F17200 Nicotine dependence, unspecified, uncomplicated: Secondary | ICD-10-CM | POA: Insufficient documentation

## 2023-08-02 DIAGNOSIS — Z79899 Other long term (current) drug therapy: Secondary | ICD-10-CM | POA: Insufficient documentation

## 2023-08-02 DIAGNOSIS — D72829 Elevated white blood cell count, unspecified: Secondary | ICD-10-CM | POA: Insufficient documentation

## 2023-08-02 DIAGNOSIS — D751 Secondary polycythemia: Secondary | ICD-10-CM

## 2023-08-02 LAB — CBC WITH DIFFERENTIAL (CANCER CENTER ONLY)
Abs Immature Granulocytes: 0.01 K/uL (ref 0.00–0.07)
Basophils Absolute: 0.1 K/uL (ref 0.0–0.1)
Basophils Relative: 1 %
Eosinophils Absolute: 0.1 K/uL (ref 0.0–0.5)
Eosinophils Relative: 2 %
HCT: 42.3 % (ref 39.0–52.0)
Hemoglobin: 14.3 g/dL (ref 13.0–17.0)
Immature Granulocytes: 0 %
Lymphocytes Relative: 23 %
Lymphs Abs: 1.8 K/uL (ref 0.7–4.0)
MCH: 29.5 pg (ref 26.0–34.0)
MCHC: 33.8 g/dL (ref 30.0–36.0)
MCV: 87.2 fL (ref 80.0–100.0)
Monocytes Absolute: 0.8 K/uL (ref 0.1–1.0)
Monocytes Relative: 10 %
Neutro Abs: 5.2 K/uL (ref 1.7–7.7)
Neutrophils Relative %: 64 %
Platelet Count: 289 K/uL (ref 150–400)
RBC: 4.85 MIL/uL (ref 4.22–5.81)
RDW: 13.1 % (ref 11.5–15.5)
WBC Count: 8.1 K/uL (ref 4.0–10.5)
nRBC: 0 % (ref 0.0–0.2)

## 2023-08-02 LAB — CMP (CANCER CENTER ONLY)
ALT: 19 U/L (ref 0–44)
AST: 19 U/L (ref 15–41)
Albumin: 4.2 g/dL (ref 3.5–5.0)
Alkaline Phosphatase: 56 U/L (ref 38–126)
Anion gap: 7 (ref 5–15)
BUN: 13 mg/dL (ref 6–20)
CO2: 26 mmol/L (ref 22–32)
Calcium: 8.9 mg/dL (ref 8.9–10.3)
Chloride: 104 mmol/L (ref 98–111)
Creatinine: 0.88 mg/dL (ref 0.61–1.24)
GFR, Estimated: >60 mL/min (ref >60)
Glucose, Bld: 98 mg/dL (ref 70–99)
Potassium: 4.2 mmol/L (ref 3.5–5.1)
Sodium: 137 mmol/L (ref 135–145)
Total Bilirubin: 0.4 mg/dL (ref 0.0–1.2)
Total Protein: 6.9 g/dL (ref 6.5–8.1)

## 2023-08-02 LAB — FERRITIN: Ferritin: 13 ng/mL — ABNORMAL LOW (ref 24–336)

## 2023-08-02 LAB — IRON AND IRON BINDING CAPACITY (CC-WL,HP ONLY)
Iron: 37 ug/dL — ABNORMAL LOW (ref 45–182)
Saturation Ratios: 8 % — ABNORMAL LOW (ref 17.9–39.5)
TIBC: 477 ug/dL — ABNORMAL HIGH (ref 250–450)
UIBC: 440 ug/dL — ABNORMAL HIGH (ref 117–376)

## 2023-08-02 NOTE — Progress Notes (Signed)
 Hematology and Oncology Follow Up Visit  Stuart Hill [id number] 1970/12/21 53 y.o. 08/02/2023   Principle Diagnosis:  Secondary polycythemia JAK2 (-), smoker  LEFT Bell's palsy Leukocytosis    Current Therapy:        Phlebotomy to maintain hematocrit below 45% 2 baby aspirin  daily at bedtime    Interim History:  Stuart Hill is here today with his wife for follow-up. Hct is 42%. He is doing fairly well. Their power was turned off so he states that their home is warm during the day. He plans to follow-up with the social worker who has reached out from his PCP's office.  No issues with fever, chills, n/v, cough, rash, dizziness, SOB, chest pain, palpitations, abdominal pain or changes in bowel or bladder habits.  Left foot is hurting after he sprained his ankle a month or so ago. He plans to follow-up with PCP for this.  No falls or syncope.  No numbness or tingling.  Appetite and hydration are much improved. Weight is stable at 192 lbs.   ECOG Performance Status: 1 - Symptomatic but completely ambulatory  Medications:  Allergies as of 08/02/2023       Reactions   Hydroxyzine  Pamoate Hives, Itching   Plavix  [clopidogrel  Bisulfate] Other (See Comments)   Confusion, lethargy, slurred speech   Afrin [nasal Spray] Hives   Antihistamines, Chlorpheniramine-type Hives   Benadryl  [diphenhydramine  Hcl] Hives   Buprenorphine Hcl Hives   Carisoprodol Hives   Codeine Hives   Metoclopramide Hives   Morphine  And Codeine Hives   Oxymetazoline Hives   Pheniramine Hives, Rash        Medication List        Accurate as of August 02, 2023  1:05 PM. If you have any questions, ask your nurse or doctor.          albuterol  108 (90 Base) MCG/ACT inhaler Commonly known as: VENTOLIN  HFA Inhale 1-2 puffs into the lungs every 6 (six) hours as needed for wheezing or shortness of breath.   ALPRAZolam  1 MG tablet Commonly known as: XANAX  TAKE 1 TABLET BY MOUTH THREE TIMES A DAY AS NEEDED  FOR ANXIETY   aspirin  EC 81 MG tablet Take 162 mg by mouth at bedtime. Swallow whole.   nitroGLYCERIN  0.4 MG SL tablet Commonly known as: NITROSTAT  Place 1 tablet (0.4 mg total) under the tongue every 5 (five) minutes as needed for chest pain.   VISINE OP Apply 1 drop to eye as needed (itchy, dryness).        Allergies:  Allergies  Allergen Reactions   Hydroxyzine  Pamoate Hives and Itching   Plavix  [Clopidogrel  Bisulfate] Other (See Comments)    Confusion, lethargy, slurred speech   Afrin [Nasal Spray] Hives   Antihistamines, Chlorpheniramine-Type Hives   Benadryl  [Diphenhydramine  Hcl] Hives   Buprenorphine Hcl Hives   Carisoprodol Hives   Codeine Hives   Metoclopramide Hives   Morphine  And Codeine Hives   Oxymetazoline Hives   Pheniramine Hives and Rash    Past Medical History, Surgical history, Social history, and Family History were reviewed and updated.  Review of Systems: All other 10 point review of systems is negative.   Physical Exam:  height is 6' 5 (1.956 m) and weight is 192 lb 12.8 oz (87.5 kg). His oral temperature is 97.8 F (36.6 C). His blood pressure is 136/78 and his pulse is 58 (abnormal). His respiration is 18 and oxygen saturation is 100%.   Wt Readings from Last 3 Encounters:  08/02/23 192 lb 12.8 oz (87.5 kg)  06/29/23 187 lb 3.2 oz (84.9 kg)  06/04/23 182 lb 12.2 oz (82.9 kg)    Ocular: Sclerae unicteric, pupils equal, round and reactive to light Ear-nose-throat: Oropharynx clear, dentition fair Lymphatic: No cervical or supraclavicular adenopathy Lungs no rales or rhonchi, good excursion bilaterally Heart regular rate and rhythm, no murmur appreciated Abd soft, nontender, positive bowel sounds MSK no focal spinal tenderness, no joint edema Neuro: non-focal, well-oriented, appropriate affect Breasts: Deferred   Lab Results  Component Value Date   WBC 8.1 08/02/2023   HGB 14.3 08/02/2023   HCT 42.3 08/02/2023   MCV 87.2  08/02/2023   PLT 289 08/02/2023   Lab Results  Component Value Date   FERRITIN 14 (L) 06/29/2023   IRON 44 (L) 06/29/2023   TIBC 515 (H) 06/29/2023   UIBC 471 (H) 06/29/2023   IRONPCTSAT 9 (L) 06/29/2023   Lab Results  Component Value Date   RETICCTPCT 1.8 04/22/2022   RBC 4.85 08/02/2023   No results found for: KPAFRELGTCHN, LAMBDASER, KAPLAMBRATIO No results found for: IGGSERUM, IGA, IGMSERUM No results found for: STEPHANY CARLOTA BENSON MARKEL EARLA JOANNIE DOC VICK, SPEI   Chemistry      Component Value Date/Time   NA 137 06/29/2023 1025   NA 143 01/01/2017 1453   NA 139 11/12/2015 0945   K 4.1 06/29/2023 1025   K 4.3 01/01/2017 1453   K 3.7 11/12/2015 0945   CL 104 06/29/2023 1025   CL 103 01/01/2017 1453   CO2 25 06/29/2023 1025   CO2 27 01/01/2017 1453   CO2 22 11/12/2015 0945   BUN 15 06/29/2023 1025   BUN 18 01/01/2017 1453   BUN 13.1 11/12/2015 0945   CREATININE 0.85 06/29/2023 1025   CREATININE 0.96 10/05/2019 1042   CREATININE 0.9 11/12/2015 0945      Component Value Date/Time   CALCIUM  8.8 (L) 06/29/2023 1025   CALCIUM  8.8 01/01/2017 1453   CALCIUM  8.9 11/12/2015 0945   ALKPHOS 58 06/29/2023 1025   ALKPHOS 44 01/01/2017 1453   ALKPHOS 66 11/12/2015 0945   AST 14 (L) 06/29/2023 1025   AST 13 11/12/2015 0945   ALT 15 06/29/2023 1025   ALT 368 (HH) 01/01/2017 1453   ALT 14 11/12/2015 0945   BILITOT 0.3 06/29/2023 1025   BILITOT <0.22 11/12/2015 0945       Impression and Plan:  Stuart Hill is a 53 yo caucasian male with history of secondary polycythemia, JAK2 negative; smoker.   No phlebotomy for Hct 42%. He will continue taking his 2 baby aspirin  daily.  Follow-up in 6 weeks.    Lauraine Pepper, NP 7/7/20251:05 PM

## 2023-09-08 ENCOUNTER — Encounter (HOSPITAL_BASED_OUTPATIENT_CLINIC_OR_DEPARTMENT_OTHER): Payer: Self-pay | Admitting: Emergency Medicine

## 2023-09-08 ENCOUNTER — Emergency Department (HOSPITAL_BASED_OUTPATIENT_CLINIC_OR_DEPARTMENT_OTHER)
Admission: EM | Admit: 2023-09-08 | Discharge: 2023-09-08 | Disposition: A | Discharge status: Home / Self Care | Payer: Self-pay

## 2023-09-08 ENCOUNTER — Emergency Department (HOSPITAL_BASED_OUTPATIENT_CLINIC_OR_DEPARTMENT_OTHER): Payer: Self-pay

## 2023-09-08 ENCOUNTER — Other Ambulatory Visit: Payer: Self-pay

## 2023-09-08 DIAGNOSIS — R109 Unspecified abdominal pain: Secondary | ICD-10-CM | POA: Insufficient documentation

## 2023-09-08 DIAGNOSIS — R112 Nausea with vomiting, unspecified: Secondary | ICD-10-CM

## 2023-09-08 DIAGNOSIS — Z7951 Long term (current) use of inhaled steroids: Secondary | ICD-10-CM | POA: Insufficient documentation

## 2023-09-08 DIAGNOSIS — R059 Cough, unspecified: Secondary | ICD-10-CM | POA: Insufficient documentation

## 2023-09-08 DIAGNOSIS — J449 Chronic obstructive pulmonary disease, unspecified: Secondary | ICD-10-CM | POA: Insufficient documentation

## 2023-09-08 DIAGNOSIS — M79671 Pain in right foot: Secondary | ICD-10-CM

## 2023-09-08 DIAGNOSIS — Z7982 Long term (current) use of aspirin: Secondary | ICD-10-CM | POA: Insufficient documentation

## 2023-09-08 DIAGNOSIS — R509 Fever, unspecified: Secondary | ICD-10-CM | POA: Insufficient documentation

## 2023-09-08 LAB — CBC
HCT: 46.2 % (ref 39.0–52.0)
Hemoglobin: 15.4 g/dL (ref 13.0–17.0)
MCH: 29.1 pg (ref 26.0–34.0)
MCHC: 33.3 g/dL (ref 30.0–36.0)
MCV: 87.2 fL (ref 80.0–100.0)
Platelets: 281 K/uL (ref 150–400)
RBC: 5.3 MIL/uL (ref 4.22–5.81)
RDW: 14.6 % (ref 11.5–15.5)
WBC: 10.1 K/uL (ref 4.0–10.5)
nRBC: 0 % (ref 0.0–0.2)

## 2023-09-08 LAB — URINALYSIS, ROUTINE W REFLEX MICROSCOPIC
Bilirubin Urine: NEGATIVE
Glucose, UA: NEGATIVE mg/dL
Hgb urine dipstick: NEGATIVE
Ketones, ur: NEGATIVE mg/dL
Leukocytes,Ua: NEGATIVE
Nitrite: NEGATIVE
Protein, ur: NEGATIVE mg/dL
Specific Gravity, Urine: 1.02 (ref 1.005–1.030)
pH: 5.5 (ref 5.0–8.0)

## 2023-09-08 LAB — COMPREHENSIVE METABOLIC PANEL WITH GFR
ALT: 13 U/L (ref 0–44)
AST: 22 U/L (ref 15–41)
Albumin: 4.5 g/dL (ref 3.5–5.0)
Alkaline Phosphatase: 70 U/L (ref 38–126)
Anion gap: 11 (ref 5–15)
BUN: 12 mg/dL (ref 6–20)
CO2: 24 mmol/L (ref 22–32)
Calcium: 9.1 mg/dL (ref 8.9–10.3)
Chloride: 102 mmol/L (ref 98–111)
Creatinine, Ser: 0.88 mg/dL (ref 0.61–1.24)
GFR, Estimated: >60 mL/min (ref >60)
Glucose, Bld: 93 mg/dL (ref 70–99)
Potassium: 4.6 mmol/L (ref 3.5–5.1)
Sodium: 136 mmol/L (ref 135–145)
Total Bilirubin: 0.4 mg/dL (ref 0.0–1.2)
Total Protein: 7.3 g/dL (ref 6.5–8.1)

## 2023-09-08 LAB — LIPASE, BLOOD: Lipase: 27 U/L (ref 11–51)

## 2023-09-08 MED ORDER — ONDANSETRON 4 MG PO TBDP
4.0000 mg | ORAL_TABLET | Freq: Three times a day (TID) | ORAL | 0 refills | Status: DC | PRN
Start: 2023-09-08 — End: 2023-12-06

## 2023-09-08 MED ORDER — ONDANSETRON 4 MG PO TBDP
4.0000 mg | ORAL_TABLET | Freq: Once | ORAL | Status: AC | PRN
  Administered 2023-09-08 (×2): 4 mg via ORAL
  Filled 2023-09-08: qty 1

## 2023-09-08 NOTE — Discharge Instructions (Addendum)
 As discussed, you will need to follow-up with your primary care provider and orthopedics.  Please seek emergency care if experiencing any new or worsening symptoms.  Alternating between 650 mg Tylenol  and 400 mg Advil : The best way to alternate taking Acetaminophen  (example Tylenol ) and Ibuprofen  (example Advil /Motrin ) is to take them 3 hours apart. For example, if you take ibuprofen  at 6 am you can then take Tylenol  at 9 am. You can continue this regimen throughout the day, making sure you do not exceed the recommended maximum dose for each drug.

## 2023-09-08 NOTE — ED Triage Notes (Signed)
 Pt c/o n/v x 2 weeks, intermittent blurred vision and dry cough.    Reports having mold in home.

## 2023-09-08 NOTE — ED Provider Notes (Signed)
 [place] [facility] Provider Note   CSN: [id number] Arrival date & time: 09/08/23  1329     Patient presents with: Emesis   Stuart Hill is a 53 y.o. male with PMHx OA, COPD, HLD who presents to the ED with multiple complaints.   Intermittent abdominal cramping, nausea and vomiting x2 weeks. Patient's cat and wife with same symptoms. Patient also with intermittent productive cough starting 2 days ago that is worse in the morning. Patient stating that he recently discovered mold in his house and is now living in a short stay housing. Endorses subjective fevers as well. Patient with chronic dyspnea from COPD which has not worsened recently. Last BM today. Denies diarrhea.  Patient's nausea improved after receiving zofran  in triage.   Denies chest pain, diarrhea, dysuria, hematuria, hematochezia, hematemesis. Denies recent hemoptysis.     Emesis      Prior to Admission medications   Medication Sig Start Date End Date Taking? Authorizing Provider  ondansetron  (ZOFRAN -ODT) 4 MG disintegrating tablet Take 1 tablet (4 mg total) by mouth every 8 (eight) hours as needed for nausea. 09/08/23  Yes Hoy Fraction F, PA-C  albuterol  (VENTOLIN  HFA) 108 (90 Base) MCG/ACT inhaler Inhale 1-2 puffs into the lungs every 6 (six) hours as needed for wheezing or shortness of breath. Patient not taking: Reported on 08/02/2023 07/23/22   Copland, Harlene BROCKS, MD  ALPRAZolam  (XANAX ) 1 MG tablet TAKE 1 TABLET BY MOUTH THREE TIMES A DAY AS NEEDED FOR ANXIETY Patient not taking: Reported on 08/02/2023 06/08/23   Copland, Harlene BROCKS, MD  aspirin  EC 81 MG tablet Take 162 mg by mouth at bedtime. Swallow whole. Patient not taking: Reported on 08/02/2023    [provider]  nitroGLYCERIN  (NITROSTAT ) 0.4 MG SL tablet Place 1 tablet (0.4 mg total) under the tongue every 5 (five) minutes as needed for chest pain. Patient not taking: Reported on 08/02/2023 05/09/23   Copland,  Harlene BROCKS, MD  Tetrahydrozoline HCl (VISINE OP) Apply 1 drop to eye as needed (itchy, dryness). Patient not taking: Reported on 08/02/2023    [provider]    Allergies: Hydroxyzine  pamoate; Plavix  [clopidogrel  bisulfate]; Afrin [nasal spray]; Antihistamines, chlorpheniramine-type; Benadryl  [diphenhydramine  hcl]; Buprenorphine hcl; Carisoprodol; Codeine; Metoclopramide; Morphine  and codeine; Oxymetazoline; and Pheniramine    Review of Systems  Gastrointestinal:  Positive for vomiting.    Updated Vital Signs BP (!) 139/92   Pulse (!) 58   Temp 98 F (36.7 C)   Resp 15   Ht 6' 3 (1.905 m)   Wt 88.9 kg   SpO2 98%   BMI 24.50 kg/m   Physical Exam Vitals and nursing note reviewed.  Constitutional:      General: He is not in acute distress.    Appearance: He is not ill-appearing or toxic-appearing.  HENT:     Head: Normocephalic and atraumatic.     Mouth/Throat:     Mouth: Mucous membranes are moist.  Eyes:     General: No scleral icterus.       Right eye: No discharge.        Left eye: No discharge.     Conjunctiva/sclera: Conjunctivae normal.  Cardiovascular:     Rate and Rhythm: Normal rate and regular rhythm.     Pulses: Normal pulses.     Heart sounds: Normal heart sounds. No murmur heard. Pulmonary:     Effort: Pulmonary effort is normal. No respiratory distress.     Breath sounds: Normal  breath sounds. No wheezing, rhonchi or rales.  Abdominal:     General: Abdomen is flat. Bowel sounds are normal. There is no distension.     Palpations: Abdomen is soft. There is no mass.     Tenderness: There is no abdominal tenderness.  Musculoskeletal:     Right lower leg: No edema.     Left lower leg: No edema.  Skin:    General: Skin is warm and dry.     Findings: No rash.  Neurological:     General: No focal deficit present.     Mental Status: He is alert and oriented to person, place, and time. Mental status is at baseline.  Psychiatric:        Mood and  Affect: Mood normal.        Behavior: Behavior normal.     (all labs ordered are listed, but only abnormal results are displayed) Labs Reviewed  LIPASE, BLOOD  COMPREHENSIVE METABOLIC PANEL WITH GFR  CBC  URINALYSIS, ROUTINE W REFLEX MICROSCOPIC    EKG: None  Radiology: DG Foot 2 Views Right Result Date: 09/08/2023 CLINICAL DATA:  Pain.  Injury in May. EXAM: RIGHT FOOT - 2 VIEW COMPARISON:  Radiograph 06/04/2023 FINDINGS: No acute or healing fracture. Normal alignment, no dislocation. Joint spaces are preserved. Minor spurring of the first metatarsal phalangeal joint. Small plantar calcaneal spur and fragmented Achilles tendon enthesophyte, chronic. Unremarkable soft tissues. IMPRESSION: 1. No acute or healing fracture. 2. Minor degenerative spurring of the first metatarsophalangeal joint. Electronically Signed   By: Andrea Gasman M.D.   On: 09/08/2023 18:16   DG Chest 2 View Result Date: 09/08/2023 CLINICAL DATA:  Cough. EXAM: CHEST - 2 VIEW COMPARISON:  07/23/2022 FINDINGS: The cardiomediastinal contours are normal. The lungs are clear. Pulmonary vasculature is normal. No consolidation, pleural effusion, or pneumothorax. No acute osseous abnormalities are seen. IMPRESSION: No active cardiopulmonary disease. Electronically Signed   By: Andrea Gasman M.D.   On: 09/08/2023 18:09     Procedures   Medications Ordered in the ED  ondansetron  (ZOFRAN -ODT) disintegrating tablet 4 mg (4 mg Oral Given 09/08/23 1347)                                    Medical Decision Making Amount and/or Complexity of Data Reviewed Labs: ordered. Radiology: ordered.  Risk Prescription drug management.    This patient presents to the ED for concern of abdominal pain, this involves an extensive number of treatment options, and is a complaint that carries with it a high risk of complications and morbidity.  The differential diagnosis includes gastroenteritis, colitis, small bowel obstruction,  appendicitis, cholecystitis, pancreatitis, nephrolithiasis, UTI, pyelonephritis   Co morbidities that complicate the patient evaluation  OA, COPD, HLD   Additional history obtained:  Dr. Watt PCP   Problem List / ED Course / Critical interventions / Medication management  Patient presented for intermittent and multiple complaints.  Patient believes that his intermittent nausea, vomiting, abdominal cramps, and coughing may be due to mold exposure in his house.  Patient has since moved out of his house.  Patient no longer nauseated after a dose of Zofran  in ED.  Patient with right foot pain for many months.  Denies any trauma.  Physical exam reassuring.  Patient afebrile with stable vitals. I Ordered, and personally interpreted labs.  UA not concerning for infection.  CBC without leukocytosis or anemia.  CMP  reassuring.  Lipase within normal limits. I ordered imaging studies including chest and foot x-ray I independently visualized and interpreted imaging and I agree with the radiologist interpretation of no acute process. Shared all results with patient.  Answered all questions.  Patient understands that he need to follow-up with his PCP.  Will prescribe patient Zofran  for his intermittent nausea. I have reviewed the patients home medicines and have made adjustments as needed The patient has been appropriately medically screened and/or stabilized in the ED. I have low suspicion for any other emergent medical condition which would require further screening, evaluation or treatment in the ED or require inpatient management. At time of discharge the patient is hemodynamically stable and in no acute distress. I have discussed work-up results and diagnosis with patient and answered all questions. Patient is agreeable with discharge plan. We discussed strict return precautions for returning to the emergency department and they verbalized understanding.     Social Determinants of  Health:  none       Final diagnoses:  Nausea and vomiting, unspecified vomiting type  Right foot pain    ED Discharge Orders          Ordered    ondansetron  (ZOFRAN -ODT) 4 MG disintegrating tablet  Every 8 hours PRN        09/08/23 1828               Hoy Nidia FALCON, PA-C 09/08/23 1831    Simon Lavonia SAILOR, MD 09/08/23 [phone]

## 2023-09-13 ENCOUNTER — Inpatient Hospital Stay: Payer: Self-pay

## 2023-09-13 ENCOUNTER — Inpatient Hospital Stay: Payer: Self-pay | Admitting: Family

## 2023-09-13 ENCOUNTER — Inpatient Hospital Stay: Payer: Self-pay | Attending: Hematology & Oncology

## 2023-09-13 DIAGNOSIS — D751 Secondary polycythemia: Secondary | ICD-10-CM | POA: Insufficient documentation

## 2023-09-14 ENCOUNTER — Inpatient Hospital Stay: Payer: Self-pay

## 2023-09-14 ENCOUNTER — Inpatient Hospital Stay: Payer: Self-pay | Admitting: Family

## 2023-09-14 LAB — IRON AND IRON BINDING CAPACITY (CC-WL,HP ONLY)
Iron: 50 ug/dL (ref 45–182)
Saturation Ratios: 10 % — ABNORMAL LOW (ref 17.9–39.5)
TIBC: 510 ug/dL — ABNORMAL HIGH (ref 250–450)
UIBC: 460 ug/dL

## 2023-09-14 LAB — CBC WITH DIFFERENTIAL (CANCER CENTER ONLY)
Abs Immature Granulocytes: 0.02 K/uL (ref 0.00–0.07)
Basophils Absolute: 0.1 K/uL (ref 0.0–0.1)
Basophils Relative: 1 %
Eosinophils Absolute: 0.1 K/uL (ref 0.0–0.5)
Eosinophils Relative: 2 %
HCT: 48.6 % (ref 39.0–52.0)
Hemoglobin: 16.2 g/dL (ref 13.0–17.0)
Immature Granulocytes: 0 %
Lymphocytes Relative: 25 %
Lymphs Abs: 1.9 K/uL (ref 0.7–4.0)
MCH: 29.6 pg (ref 26.0–34.0)
MCHC: 33.3 g/dL (ref 30.0–36.0)
MCV: 88.7 fL (ref 80.0–100.0)
Monocytes Absolute: 0.7 K/uL (ref 0.1–1.0)
Monocytes Relative: 10 %
Neutro Abs: 4.5 K/uL (ref 1.7–7.7)
Neutrophils Relative %: 62 %
Platelet Count: 269 K/uL (ref 150–400)
RBC: 5.48 MIL/uL (ref 4.22–5.81)
RDW: 14 % (ref 11.5–15.5)
WBC Count: 7.3 K/uL (ref 4.0–10.5)
nRBC: 0 % (ref 0.0–0.2)

## 2023-09-14 LAB — CMP (CANCER CENTER ONLY)
ALT: 16 U/L (ref 0–44)
AST: 19 U/L (ref 15–41)
Albumin: 4.6 g/dL (ref 3.5–5.0)
Alkaline Phosphatase: 66 U/L (ref 38–126)
Anion gap: 11 (ref 5–15)
BUN: 10 mg/dL (ref 6–20)
CO2: 26 mmol/L (ref 22–32)
Calcium: 9.2 mg/dL (ref 8.9–10.3)
Chloride: 104 mmol/L (ref 98–111)
Creatinine: 0.77 mg/dL (ref 0.61–1.24)
GFR, Estimated: >60 mL/min (ref >60)
Glucose, Bld: 103 mg/dL — ABNORMAL HIGH (ref 70–99)
Potassium: 4.5 mmol/L (ref 3.5–5.1)
Sodium: 141 mmol/L (ref 135–145)
Total Bilirubin: 0.3 mg/dL (ref 0.0–1.2)
Total Protein: 7.7 g/dL (ref 6.5–8.1)

## 2023-09-14 LAB — FERRITIN: Ferritin: 31 ng/mL (ref 24–336)

## 2023-09-14 NOTE — Patient Instructions (Addendum)
 Blood Transfusion, Adult A blood transfusion is a procedure in which you receive blood through an IV tube. You may need this procedure because of: A bleeding disorder. An illness. An injury. A surgery. The blood may come from someone else (a donor). You may also be able to donate blood for yourself before a surgery. The blood given in a transfusion may be made up of different types of cells. You may get: Red blood cells. These carry oxygen to the cells in the body. Platelets. These help your blood to clot. Plasma. This is the liquid part of your blood. It carries proteins and other substances through the body. White blood cells. These help you fight infections. If you have a clotting disorder, you may also get other types of blood products. Depending on the type of blood product, this procedure may take 1-4 hours to complete. Tell your doctor about: Any bleeding problems you have. Any reactions you have had during a blood transfusion in the past. Any allergies you have. All medicines you are taking, including vitamins, herbs, eye drops, creams, and over-the-counter medicines. Any surgeries you have had. Any medical conditions you have. Whether you are pregnant or may be pregnant. What are the risks? Talk with your health care provider about risks. The most common problems include: A mild allergic reaction. This includes red, swollen areas of skin (hives) and itching. Fever or chills. This may be the body's response to new blood cells received. This may happen during or up to 4 hours after the transfusion. More serious problems may include: A serious allergic reaction. This includes breathing trouble or swelling around the face and lips. Too much fluid in the lungs. This may cause breathing problems. Lung injury. This causes breathing trouble and low oxygen in the blood. This can happen within hours of the transfusion or days later. Too much iron. This can happen after getting many blood  transfusions over a period of time. An infection or virus passed through the blood. This is rare. Donated blood is carefully tested before it is given. Your body's defense system (immune system) trying to attack the new blood cells. This is rare. Symptoms may include fever, chills, nausea, low blood pressure, and low back or chest pain. Donated cells attacking healthy tissues. This is rare. What happens before the procedure? You will have a blood test to find out your blood type. The test also finds out what type of blood your body will accept and matches it to the donor type. If you are going to have a planned surgery, you may be able to donate your own blood. This may be done in case you need a transfusion. You will have your temperature, blood pressure, and pulse checked. You may receive medicine to help prevent an allergic reaction. This may be done if you have had a reaction to a transfusion before. This medicine may be given to you by mouth or through an IV tube. What happens during the procedure?  An IV tube will be put into one of your veins. The bag of blood will be attached to your IV tube. Then, the blood will enter through your vein. Your temperature, blood pressure, and pulse will be checked often. This is done to find early signs of a transfusion reaction. Tell your nurse right away if you have any of these symptoms: Shortness of breath or trouble breathing. Chest or back pain. Fever or chills. Red, swollen areas of skin or itching. If you have any signs  or symptoms of a reaction, your transfusion will be stopped. You may also be given medicine. When the transfusion is finished, your IV tube will be taken out. Pressure may be put on the IV site for a few minutes. A bandage (dressing) will be put on the IV site. The procedure may vary among doctors and hospitals. What happens after the procedure? You will be monitored until you leave the hospital or clinic. This includes  checking your temperature, blood pressure, pulse, breathing rate, and blood oxygen level. Your blood may be tested to see how you have responded to the transfusion. You may be warmed with fluids or blankets. This is done to keep the temperature of your body normal. If you have your procedure in an outpatient setting, you will be told whom to contact to report any reactions. Where to find more information Visit the [employer]: redcross.org Summary A blood transfusion is a procedure in which you receive blood through an IV tube. The blood you are given may be made up of different blood cells. You may receive red blood cells, platelets, plasma, or white blood cells. Your temperature, blood pressure, and pulse will be checked often. After the procedure, your blood may be tested to see how you have responded. This information is not intended to replace advice given to you by your health care provider. Make sure you discuss any questions you have with your health care provider. Document Revised: 04/11/2021 Document Reviewed: 04/11/2021 Elsevier Patient Education  2024 [employer]Therapeutic Phlebotomy Therapeutic phlebotomy is the planned removal of blood from a person's body for the purpose of treating a medical condition. The procedure is lot like donating blood. Usually, about a pint (470 mL, or 0.47 L) of blood is removed. The average adult has 9-12 pints (4.3-5.7 L) of blood in his or her body. Therapeutic phlebotomy may be used to treat the following medical conditions: Hemochromatosis. This is a condition in which the blood contains too much iron. Polycythemia vera. This is a condition in which the blood contains too many red blood cells. Porphyria cutanea tarda. This is a disease in which an important part of hemoglobin is not made properly. It results in the buildup of abnormal amounts of porphyrins in the body. Sickle cell disease. This is a condition in which the red blood cells  form an abnormal crescent shape rather than a round shape. Tell a health care provider about: Any allergies you have. All medicines you are taking, including vitamins, herbs, eye drops, creams, and over-the-counter medicines. Any bleeding problems you have. Any surgeries you have had. Any medical conditions you have. Whether you are pregnant or may be pregnant. What are the risks? Generally, this is a safe procedure. However, problems may occur, including: Nausea or light-headedness. Low blood pressure (hypotension). Soreness, bleeding, swelling, or bruising at the needle insertion site. Infection. What happens before the procedure? Ask your health care provider about: Changing or stopping your regular medicines. This is especially important if you are taking diabetes medicines or blood thinners. Taking medicines such as aspirin  and ibuprofen . These medicines can thin your blood. Do not take these medicines unless your health care provider tells you to take them. Taking over-the-counter medicines, vitamins, herbs, and supplements. Wear clothing with sleeves that can be raised above the elbow. You may have a blood sample taken. Your blood pressure, pulse rate, and breathing rate will be measured. What happens during the procedure?  You may be given a medicine to numb the  area (local anesthetic). A tourniquet will be placed on your arm. A needle will be put into one of your veins. Tubing and a collection bag will be attached to the needle. Blood will flow through the needle and tubing into the collection bag. The collection bag will be placed lower than your arm so gravity can help the blood flow into the bag. You may be asked to open and close your hand slowly and continually during the entire collection. After the specified amount of blood has been removed from your body, the collection bag and tubing will be clamped. The needle will be removed from your vein. Pressure will be held  on the needle site to stop the bleeding. A bandage (dressing) will be placed over the needle insertion site. The procedure may vary among health care providers and hospitals. What happens after the procedure? Your blood pressure, pulse rate, and breathing rate will be measured after the procedure. You will be encouraged to drink fluids. You will be encouraged to eat a snack to prevent a low blood sugar level. Your recovery will be assessed and monitored. Return to your normal activities as told by your health care provider. Summary Therapeutic phlebotomy is the planned removal of blood from a person's body for the purpose of treating a medical condition. Therapeutic phlebotomy may be used to treat hemochromatosis, polycythemia vera, porphyria cutanea tarda, or sickle cell disease. In the procedure, a needle is inserted and about a pint (470 mL, or 0.47 L) of blood is removed. The average adult has 9-12 pints (4.3-5.7 L) of blood in the body. This is generally a safe procedure, but it can sometimes cause problems such as nausea, light-headedness, or low blood pressure (hypotension). This information is not intended to replace advice given to you by your health care provider. Make sure you discuss any questions you have with your health care provider. Document Revised: 07/10/2020 Document Reviewed: 07/10/2020 Elsevier Patient Education  2024 [employer].

## 2023-09-14 NOTE — Progress Notes (Signed)
 Stuart Hill presents today for phlebotomy per MD orders. Phlebotomy procedure started at 1235 with 16 g phlebotomy kit and ended at 1255. 510 grams removed. Patient refused to stay for observation after procedure.  VSS Patient tolerated procedure well. IV needle removed intact.

## 2023-09-14 NOTE — Progress Notes (Signed)
 Stuart Hill presents today for phlebotomy per MD orders. Phlebotomy procedure started at 1239 and ended at 1245. 500 grams removed. Patient observed for 30 minutes after procedure without any incident. Patient tolerated procedure well. IV needle removed intact.

## 2023-10-04 ENCOUNTER — Encounter (HOSPITAL_BASED_OUTPATIENT_CLINIC_OR_DEPARTMENT_OTHER): Payer: Self-pay | Admitting: Emergency Medicine

## 2023-10-04 ENCOUNTER — Other Ambulatory Visit: Payer: Self-pay

## 2023-10-04 ENCOUNTER — Emergency Department (HOSPITAL_BASED_OUTPATIENT_CLINIC_OR_DEPARTMENT_OTHER): Payer: Self-pay

## 2023-10-04 ENCOUNTER — Emergency Department (HOSPITAL_BASED_OUTPATIENT_CLINIC_OR_DEPARTMENT_OTHER)
Admission: EM | Admit: 2023-10-04 | Discharge: 2023-10-04 | Disposition: A | Discharge status: Home / Self Care | Payer: Self-pay | Attending: Emergency Medicine | Admitting: Emergency Medicine

## 2023-10-04 DIAGNOSIS — R042 Hemoptysis: Secondary | ICD-10-CM | POA: Insufficient documentation

## 2023-10-04 DIAGNOSIS — J449 Chronic obstructive pulmonary disease, unspecified: Secondary | ICD-10-CM | POA: Insufficient documentation

## 2023-10-04 DIAGNOSIS — J329 Chronic sinusitis, unspecified: Secondary | ICD-10-CM | POA: Insufficient documentation

## 2023-10-04 DIAGNOSIS — Z7982 Long term (current) use of aspirin: Secondary | ICD-10-CM | POA: Insufficient documentation

## 2023-10-04 DIAGNOSIS — F17200 Nicotine dependence, unspecified, uncomplicated: Secondary | ICD-10-CM | POA: Insufficient documentation

## 2023-10-04 LAB — BASIC METABOLIC PANEL WITH GFR
Anion gap: 12 (ref 5–15)
BUN: 13 mg/dL (ref 6–20)
CO2: 24 mmol/L (ref 22–32)
Calcium: 9.1 mg/dL (ref 8.9–10.3)
Chloride: 106 mmol/L (ref 98–111)
Creatinine, Ser: 0.85 mg/dL (ref 0.61–1.24)
GFR, Estimated: >60 mL/min (ref >60)
Glucose, Bld: 108 mg/dL — ABNORMAL HIGH (ref 70–99)
Potassium: 3.9 mmol/L (ref 3.5–5.1)
Sodium: 142 mmol/L (ref 135–145)

## 2023-10-04 LAB — CBC WITH DIFFERENTIAL/PLATELET
Abs Immature Granulocytes: 0.03 K/uL (ref 0.00–0.07)
Basophils Absolute: 0.1 K/uL (ref 0.0–0.1)
Basophils Relative: 1 %
Eosinophils Absolute: 0.1 K/uL (ref 0.0–0.5)
Eosinophils Relative: 1 %
HCT: 43.5 % (ref 39.0–52.0)
Hemoglobin: 14.7 g/dL (ref 13.0–17.0)
Immature Granulocytes: 0 %
Lymphocytes Relative: 20 %
Lymphs Abs: 2.2 K/uL (ref 0.7–4.0)
MCH: 29.2 pg (ref 26.0–34.0)
MCHC: 33.8 g/dL (ref 30.0–36.0)
MCV: 86.5 fL (ref 80.0–100.0)
Monocytes Absolute: 0.9 K/uL (ref 0.1–1.0)
Monocytes Relative: 9 %
Neutro Abs: 7.4 K/uL (ref 1.7–7.7)
Neutrophils Relative %: 69 %
Platelets: 285 K/uL (ref 150–400)
RBC: 5.03 MIL/uL (ref 4.22–5.81)
RDW: 14.4 % (ref 11.5–15.5)
WBC: 10.7 K/uL — ABNORMAL HIGH (ref 4.0–10.5)
nRBC: 0 % (ref 0.0–0.2)

## 2023-10-04 LAB — RESP PANEL BY RT-PCR (RSV, FLU A&B, COVID)  RVPGX2
Influenza A by PCR: NEGATIVE
Influenza B by PCR: NEGATIVE
Resp Syncytial Virus by PCR: NEGATIVE
SARS Coronavirus 2 by RT PCR: NEGATIVE

## 2023-10-04 MED ORDER — IOHEXOL 350 MG/ML SOLN
80.0000 mL | Freq: Once | INTRAVENOUS | Status: AC | PRN
  Administered 2023-10-04: 80 mL via INTRAVENOUS

## 2023-10-04 MED ORDER — AMOXICILLIN 500 MG PO CAPS: 500.0000 mg | ORAL_CAPSULE | Freq: Three times a day (TID) | ORAL | 0 refills | Status: DC

## 2023-10-04 NOTE — ED Provider Notes (Signed)
 [place] [facility] Provider Note   CSN: [id number] Arrival date & time: 10/04/23  1539     Patient presents with: Cough   Stuart Hill is a 53 y.o. male.   53 year old male presents today for concern of productive cough, shortness of breath that has been ongoing now for 6 weeks.  He is an active smoker.  He states about a decade ago he was told that he had a lung nodule as well.  He also has history of polycythemia vera.  He states a few weeks ago he had fever.  Currently denies any fever.  He states over the past year he has had multiple episodes of hemoptysis.  Sometimes they are accompanied with significant coughing spells other times not.  Denies any chest pain.  No recent long travel, no recent surgery.  No prior history of DVT or PE.  He states one of his doctors told him that he needed to be set up with a pulmonologist but this has not been done yet.  The history is provided by the patient. No language interpreter was used.       Prior to Admission medications   Medication Sig Start Date End Date Taking? Authorizing Provider  albuterol  (VENTOLIN  HFA) 108 (90 Base) MCG/ACT inhaler Inhale 1-2 puffs into the lungs every 6 (six) hours as needed for wheezing or shortness of breath. Patient not taking: Reported on 08/02/2023 07/23/22   Copland, Harlene BROCKS, MD  ALPRAZolam  (XANAX ) 1 MG tablet TAKE 1 TABLET BY MOUTH THREE TIMES A DAY AS NEEDED FOR ANXIETY Patient not taking: Reported on 08/02/2023 06/08/23   Copland, Harlene BROCKS, MD  aspirin  EC 81 MG tablet Take 162 mg by mouth at bedtime. Swallow whole. Patient not taking: Reported on 08/02/2023    [provider]  nitroGLYCERIN  (NITROSTAT ) 0.4 MG SL tablet Place 1 tablet (0.4 mg total) under the tongue every 5 (five) minutes as needed for chest pain. Patient not taking: Reported on 08/02/2023 05/09/23   Copland, Harlene BROCKS, MD  ondansetron  (ZOFRAN -ODT) 4 MG disintegrating tablet Take 1 tablet (4 mg  total) by mouth every 8 (eight) hours as needed for nausea. 09/08/23   Meredith, Savannah F, PA-C  Tetrahydrozoline HCl (VISINE OP) Apply 1 drop to eye as needed (itchy, dryness). Patient not taking: Reported on 08/02/2023    [provider]    Allergies: Hydroxyzine  pamoate; Plavix  [clopidogrel  bisulfate]; Afrin [nasal spray]; Antihistamines, chlorpheniramine-type; Benadryl  [diphenhydramine  hcl]; Buprenorphine hcl; Carisoprodol; Codeine; Metoclopramide; Morphine  and codeine; Oxymetazoline; and Pheniramine    Review of Systems  Constitutional:  Negative for chills and fever.  HENT:  Positive for congestion and postnasal drip. Negative for sore throat.   Respiratory:  Positive for cough and shortness of breath.   Cardiovascular:  Negative for chest pain.  Neurological:  Negative for light-headedness.  All other systems reviewed and are negative.   Updated Vital Signs BP (!) 142/98 (BP Location: Right Arm)   Pulse 87   Temp 98.1 F (36.7 C)   Resp 20   Ht 6' 3 (1.905 m)   Wt 88.5 kg   SpO2 98%   BMI 24.37 kg/m   Physical Exam Vitals and nursing note reviewed.  Constitutional:      General: He is not in acute distress.    Appearance: Normal appearance. He is not ill-appearing.  HENT:     Head: Normocephalic and atraumatic.     Nose: Nose normal.  Eyes:  Conjunctiva/sclera: Conjunctivae normal.  Cardiovascular:     Rate and Rhythm: Normal rate and regular rhythm.  Pulmonary:     Effort: Pulmonary effort is normal. No respiratory distress.     Breath sounds: Normal breath sounds. No wheezing or rales.  Musculoskeletal:        General: No deformity. Normal range of motion.  Skin:    Findings: No rash.  Neurological:     Mental Status: He is alert.     (all labs ordered are listed, but only abnormal results are displayed) Labs Reviewed  RESP PANEL BY RT-PCR (RSV, FLU A&B, COVID)  RVPGX2  CBC WITH DIFFERENTIAL/PLATELET  BASIC METABOLIC PANEL WITH GFR     EKG: None  Radiology: No results found.   Procedures   Medications Ordered in the ED - No data to display                                  Medical Decision Making Amount and/or Complexity of Data Reviewed Labs: ordered. Radiology: ordered.  Risk Prescription drug management.   Medical Decision Making / ED Course   This patient presents to the ED for concern of cough, shortness of breath, this involves an extensive number of treatment options, and is a complaint that carries with it a high risk of complications and morbidity.  The differential diagnosis includes sinusitis, PE, malignancy, bronchitis  MDM: 53 year old male presents today for concern of productive cough and shortness of breath. Current smoker.  History of lung nodule.  Reports hemoptysis as well. Without current hemoptysis but has had multiple episodes over the past year.  Current symptoms of congestion, postnasal drip, early morning worsening of his cough sounds consistent with sinusitis.  However his hemoptysis, history of smoking, and previous known lung nodule is concerning for malignancy as well.  Discussion had any would like to proceed with CT imaging.  Labs and CT ordered.  CT negative for PE, or other acute concerns.  Does show evidence of aortic aneurysm.  This is known to the patient and he has previously seen cardiothoracic surgeon.  He states he is due for follow-up.  He will call and schedule this appointment.  Likely sinusitis for patient's symptoms.  Supportive care discussed.  Amoxicillin  prescribed.  Patient discharged in stable condition.  Return precautions discussed.   Additional history obtained: -Additional history obtained from chart review with his oncologist -External records from outside source obtained and reviewed including: Chart review including previous notes, labs, imaging, consultation notes   Lab Tests: -I ordered, reviewed, and interpreted labs.   The pertinent  results include:   Labs Reviewed  RESP PANEL BY RT-PCR (RSV, FLU A&B, COVID)  RVPGX2  CBC WITH DIFFERENTIAL/PLATELET  BASIC METABOLIC PANEL WITH GFR      EKG  EKG Interpretation Date/Time:    Ventricular Rate:    PR Interval:    QRS Duration:    QT Interval:    QTC Calculation:   R Axis:      Text Interpretation:           Imaging Studies ordered: I ordered imaging studies including chest x-ray, CT angio chest PE study I independently visualized and interpreted imaging. I agree with the radiologist interpretation   Medicines ordered and prescription drug management: No orders of the defined types were placed in this encounter.   -I have reviewed the patients home medicines and have made adjustments as needed  Reevaluation: After the interventions noted above, I reevaluated the patient and found that they have :stayed the same  Co morbidities that complicate the patient evaluation  Past Medical History:  Diagnosis Date   Allergy    Anxiety    Arthritis    Bell's palsy    COPD (chronic obstructive pulmonary disease) (HCC)    Erythrocytosis 04/10/2015   Hyperlipidemia    Polycythemia vera (HCC) 05/03/2015      Dispostion: Discharged in stable condition.  Return precaution discussed.  Patient voices understanding and is in agreement with plan.   Final diagnoses:  Sinusitis, unspecified chronicity, unspecified location  Hemoptysis    ED Discharge Orders          Ordered    amoxicillin  (AMOXIL ) 500 MG capsule  3 times daily        10/04/23 1836               Hildegard Loge, PA-C 10/04/23 1836    Lenor Hollering, MD 10/04/23 2056

## 2023-10-04 NOTE — ED Notes (Signed)
 Patient transported to CT

## 2023-10-04 NOTE — ED Triage Notes (Signed)
 Pt reports productive cough and shob x 6 weeks. Denies fever.   + smoker

## 2023-10-04 NOTE — Discharge Instructions (Addendum)
 You likely have sinusitis.  Antibiotic sent into pharmacy.  Also perform sinus rinse. CT scan did not show any concerning findings.  No evidence of blood clot, pneumonia or other concerning findings like cancer. He did have the aortic aneurysm.  You are aware of this and have a history of this and previously saw his surgeon for this.  Please schedule a follow-up appointment with the surgeon for reevaluation.  Return for any emergent symptoms.

## 2023-10-06 ENCOUNTER — Emergency Department (HOSPITAL_BASED_OUTPATIENT_CLINIC_OR_DEPARTMENT_OTHER): Payer: Self-pay

## 2023-10-06 ENCOUNTER — Encounter (HOSPITAL_BASED_OUTPATIENT_CLINIC_OR_DEPARTMENT_OTHER): Payer: Self-pay

## 2023-10-06 ENCOUNTER — Other Ambulatory Visit (HOSPITAL_BASED_OUTPATIENT_CLINIC_OR_DEPARTMENT_OTHER): Payer: Self-pay

## 2023-10-06 ENCOUNTER — Other Ambulatory Visit: Payer: Self-pay

## 2023-10-06 ENCOUNTER — Emergency Department (HOSPITAL_BASED_OUTPATIENT_CLINIC_OR_DEPARTMENT_OTHER)
Admission: EM | Admit: 2023-10-06 | Discharge: 2023-10-06 | Disposition: A | Discharge status: Home / Self Care | Payer: Self-pay | Attending: Emergency Medicine | Admitting: Emergency Medicine

## 2023-10-06 ENCOUNTER — Encounter: Payer: Self-pay | Admitting: Family

## 2023-10-06 DIAGNOSIS — I10 Essential (primary) hypertension: Secondary | ICD-10-CM | POA: Insufficient documentation

## 2023-10-06 DIAGNOSIS — Z7982 Long term (current) use of aspirin: Secondary | ICD-10-CM | POA: Insufficient documentation

## 2023-10-06 DIAGNOSIS — J449 Chronic obstructive pulmonary disease, unspecified: Secondary | ICD-10-CM | POA: Insufficient documentation

## 2023-10-06 DIAGNOSIS — F17210 Nicotine dependence, cigarettes, uncomplicated: Secondary | ICD-10-CM | POA: Insufficient documentation

## 2023-10-06 LAB — BASIC METABOLIC PANEL WITH GFR
Anion gap: 11 (ref 5–15)
BUN: 13 mg/dL (ref 6–20)
CO2: 25 mmol/L (ref 22–32)
Calcium: 9.1 mg/dL (ref 8.9–10.3)
Chloride: 103 mmol/L (ref 98–111)
Creatinine, Ser: 0.82 mg/dL (ref 0.61–1.24)
GFR, Estimated: >60 mL/min (ref >60)
Glucose, Bld: 94 mg/dL (ref 70–99)
Potassium: 4.3 mmol/L (ref 3.5–5.1)
Sodium: 139 mmol/L (ref 135–145)

## 2023-10-06 LAB — CBC
HCT: 48.6 % (ref 39.0–52.0)
Hemoglobin: 16.1 g/dL (ref 13.0–17.0)
MCH: 29 pg (ref 26.0–34.0)
MCHC: 33.1 g/dL (ref 30.0–36.0)
MCV: 87.6 fL (ref 80.0–100.0)
Platelets: 274 K/uL (ref 150–400)
RBC: 5.55 MIL/uL (ref 4.22–5.81)
RDW: 14.4 % (ref 11.5–15.5)
WBC: 9.3 K/uL (ref 4.0–10.5)
nRBC: 0 % (ref 0.0–0.2)

## 2023-10-06 LAB — TROPONIN T, HIGH SENSITIVITY: Troponin T High Sensitivity: <15 ng/L (ref 0–19)

## 2023-10-06 MED ORDER — LISINOPRIL 10 MG PO TABS
10.0000 mg | ORAL_TABLET | Freq: Once | ORAL | Status: AC
  Administered 2023-10-06: 10 mg via ORAL
  Filled 2023-10-06: qty 1

## 2023-10-06 MED ORDER — LISINOPRIL 10 MG PO TABS
10.0000 mg | ORAL_TABLET | Freq: Every day | ORAL | 0 refills | Status: DC
  Filled 2023-10-06: qty 30, 30d supply, fill #0

## 2023-10-06 NOTE — ED Provider Notes (Signed)
 [place] [facility] Provider Note  CSN: [id number] Arrival date & time: 10/06/23 1208  Chief Complaint(s) Hypertension  HPI Stuart Hill is a 53 y.o. male who is here today because he is concerned about his blood pressure being increased.  Patient also endorsed that he has felt somewhat unsteady over the last couple of months.  Patient states he is worried about his blood pressure because of the history of an aortic aneurysm.   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   Allergy 08/19/2020   Anxiety 08/19/2020   Arthritis 08/19/2020   Bell's palsy 08/19/2020   Chest pain 07/26/2020   Family history of early CAD 07/26/2020   DOE (dyspnea on exertion) 12/14/2019   Angina pectoris (HCC) 11/03/2019   Tobacco abuse 11/03/2019   Other hyperlipidemia 11/03/2019   Tonsillar hypertrophy 04/30/2017   Obstructive sleep apnea 04/23/2017   Polycythemia, secondary 09/29/2016   Polycythemia vera (HCC) 05/03/2015   Smokes 1.5 packs of cigarettes per day 04/04/2015   Laryngopharyngeal reflux (LPR) 04/04/2015   Alcohol  consumption of four to five drinks per day 04/04/2015   Hemoptysis 04/03/2015   Condylomata acuminata 01/18/2015   Generalized anxiety disorder 03/05/2014   Home Medication(s) Prior to Admission medications   Medication Sig Start Date End Date Taking? Authorizing Provider   albuterol  (VENTOLIN  HFA) 108 (90 Base) MCG/ACT inhaler Inhale 1-2 puffs into the lungs every 6 (six) hours as needed for wheezing or shortness of breath. Patient not taking: Reported on 08/02/2023 07/23/22   Copland, Harlene BROCKS, MD  ALPRAZolam  (XANAX ) 1 MG tablet TAKE 1 TABLET BY MOUTH THREE TIMES A DAY AS NEEDED FOR ANXIETY Patient not taking: Reported on 08/02/2023 06/08/23   Copland, Harlene BROCKS, MD  amoxicillin  (AMOXIL ) 500 MG capsule Take 1 capsule (500 mg total) by mouth 3 (three) times daily. 10/04/23   Hildegard Loge, PA-C  aspirin  EC 81 MG tablet Take 162 mg by mouth at bedtime. Swallow whole. Patient not taking: Reported on 08/02/2023    [provider]  nitroGLYCERIN  (NITROSTAT ) 0.4 MG SL tablet Place 1 tablet (0.4 mg total) under the tongue every 5 (five) minutes as needed for chest pain. Patient not taking: Reported on 08/02/2023 05/09/23   Copland, Harlene BROCKS, MD  ondansetron  (ZOFRAN -ODT) 4 MG disintegrating tablet Take 1 tablet (4 mg total) by mouth every 8 (eight) hours as needed for nausea. 09/08/23   Meredith, Savannah F, PA-C  Tetrahydrozoline HCl (VISINE OP) Apply 1 drop to eye as needed (itchy, dryness). Patient not taking: Reported on 08/02/2023    [provider]  Past Surgical History Past Surgical History:  Procedure Laterality Date   DENTAL SURGERY     HAND SURGERY     TONSILLECTOMY Bilateral 04/30/2017   Procedure: TONSILLECTOMY;  Surgeon: Mable Lenis, MD;  Location: [facility];  Service: ENT;  Laterality: Bilateral;   Family History Family History  Problem Relation Age of Onset   COPD Mother    Fibromyalgia Mother    Heart disease Father    Stroke Father    Depression Brother    Heart disease Brother    Heart disease Maternal Grandmother    Stroke Maternal Grandmother    Heart disease Maternal Grandfather    Stroke  Maternal Grandfather    Heart disease Paternal Grandmother    Stroke Paternal Grandmother    Heart disease Paternal Grandfather    Stroke Paternal Grandfather    Stroke Paternal Aunt     Social History Social History   Tobacco Use   Smoking status: Every Day    Current packs/day: 2.00    Average packs/day: 2.0 packs/day for 24.0 years (48.0 ttl pk-yrs)    Types: Cigarettes   Smokeless tobacco: Never   Tobacco comments:    He is smoking 2 PPD.  Vaping Use   Vaping status: Never Used  Substance Use Topics   Alcohol  use: Yes    Alcohol /week: 1.0 standard drink of alcohol     Types: 1 Cans of beer per week    Comment: 6-10 beer a week   Drug use: Yes    Types: Other-see comments, Marijuana    Comment: Marijuana every other day.   Allergies Hydroxyzine  pamoate; Plavix  [clopidogrel  bisulfate]; Afrin [nasal spray]; Antihistamines, chlorpheniramine-type; Benadryl  [diphenhydramine  hcl]; Buprenorphine hcl; Carisoprodol; Codeine; Metoclopramide; Morphine  and codeine; Oxymetazoline; and Pheniramine  Review of Systems Review of Systems  Physical Exam Vital Signs  I have reviewed the triage vital signs BP (!) 141/83   Pulse (!) 57   Temp 97.6 F (36.4 C) (Oral)   Resp 20   SpO2 99%   Physical Exam Vitals and nursing note reviewed.  Constitutional:      Appearance: Normal appearance. He is not toxic-appearing.  HENT:     Head: Normocephalic and atraumatic.  Cardiovascular:     Rate and Rhythm: Normal rate.  Pulmonary:     Effort: Pulmonary effort is normal.  Abdominal:     General: Abdomen is flat.  Musculoskeletal:        General: No swelling or deformity. Normal range of motion.     Cervical back: Normal range of motion.  Skin:    General: Skin is warm.     Findings: No rash.  Neurological:     Mental Status: He is alert.     Cranial Nerves: No cranial nerve deficit.     Sensory: No sensory deficit.     Motor: No weakness.     Gait: Gait normal.     Comments:  Patient has some slurring of his speech which reportedly is been ongoing for 1 to 2 months.  Ever since he had a Bell's palsy.     ED Results and Treatments Labs (all labs ordered are listed, but only abnormal results are displayed) Labs Reviewed  BASIC METABOLIC PANEL WITH GFR  CBC  TROPONIN T, HIGH SENSITIVITY  Radiology CT Head Wo Contrast Result Date: 10/06/2023 CLINICAL DATA:  Provided history: Head trauma, moderate/severe. Additional history provided: The patient reports increased blood pressure, decreased coordination, unsteadiness, history of Bell's palsy on the left. EXAM: CT HEAD WITHOUT CONTRAST TECHNIQUE: Contiguous axial images were obtained from the base of the skull through the vertex without intravenous contrast. RADIATION DOSE REDUCTION: This exam was performed according to the departmental dose-optimization program which includes automated exposure control, adjustment of the mA and/or kV according to patient size and/or use of iterative reconstruction technique. COMPARISON:  Head CT 11/22/2020.  Brain MRI 10/23/2020. FINDINGS: Brain: Cerebral volume is normal. There is no acute intracranial hemorrhage. No demarcated cortical infarct. No extra-axial fluid collection. No evidence of an intracranial mass. No midline shift. Vascular: No hyperdense vessel. Skull: No calvarial fracture or aggressive osseous lesion. Visible sinuses/orbits: No mass or acute finding within the imaged orbits. 12 mm left maxillary sinus mucous retention cyst. IMPRESSION: 1.  No evidence of an acute intracranial abnormality. 2. 12 mm left maxillary sinus mucous retention cyst. Electronically Signed   By: Rockey Childs D.O.   On: 10/06/2023 13:38    Pertinent labs & imaging results that were available during my care of the patient were reviewed by me and considered in my medical decision  making (see MDM for details).  Medications Ordered in ED Medications  lisinopril  (ZESTRIL ) tablet 10 mg (10 mg Oral Given 10/06/23 1324)                                                                                                                                     Procedures Procedures  (including critical care time)  Medical Decision Making / ED Course   This patient presents to the ED for concern of high blood pressure, worsening coordination, this involves an extensive number of treatment options, and is a complaint that carries with it a high risk of complications and morbidity.  The differential diagnosis includes benign hypertension, less likely cute CVA, chronic CVA, vitamin deficiencies, less likely encephalopathy,, less likely meningitis, less likely CNS mass.  MDM: On exam, patient overall well-appearing.  Sounds always had some residual effect since having Bell's palsy a while ago.  As the symptoms of slurred speech are not new, do not believe it requires a significant workup here in the ED but rather more thorough outpatient workup.  Did obtain a CT imaging the patient's head which was negative for any acute process.  Patient was concerned about his blood pressure, I agree it was mildly elevated.  Provided him with some lisinopril  here with improvement in his pressure.  He has a PCP.  I will write him a prescription for lisinopril , have him call his primary care doctor today for further additional workup.  I reviewed the patient's blood work, he has no anemia, not a significant increase in his hemoglobin or platelets, normal renal function.  He is ambulatory.  He is appropriate for discharge.   Additional history obtained: -Additional history obtained from partner at bedside -External records from outside source obtained and reviewed including: Chart review including previous notes, labs, imaging, consultation notes   Lab Tests: -I ordered, reviewed, and interpreted  labs.   The pertinent results include:   Labs Reviewed  BASIC METABOLIC PANEL WITH GFR  CBC  TROPONIN T, HIGH SENSITIVITY      EKG sinus rhythm, no acute ischemia  EKG Interpretation Date/Time:    Ventricular Rate:    PR Interval:    QRS Duration:    QT Interval:    QTC Calculation:   R Axis:      Text Interpretation:           Imaging Studies ordered: I ordered imaging studies including CT head I independently visualized and interpreted imaging. I agree with the radiologist interpretation   Medicines ordered and prescription drug management: Meds ordered this encounter  Medications   lisinopril  (ZESTRIL ) tablet 10 mg    -I have reviewed the patients home medicines and have made adjustments as needed  Cardiac Monitoring: The patient was maintained on a cardiac monitor.  I personally viewed and interpreted the cardiac monitored which showed an underlying rhythm of: Normal sinus rhythm  Social Determinants of Health:  Factors impacting patients care include: Lack of access to primary care   Reevaluation: After the interventions noted above, I reevaluated the patient and found that they have :improved  Co morbidities that complicate the patient evaluation  Past Medical History:  Diagnosis Date   Allergy    Anxiety    Arthritis    Bell's palsy    COPD (chronic obstructive pulmonary disease) (HCC)    Erythrocytosis 04/10/2015   Hyperlipidemia    Polycythemia vera (HCC) 05/03/2015      Dispostion: I considered admission for this patient, however given his duration of symptoms and his workup he is appropriate for discharge.`     Final Clinical Impression(s) / ED Diagnoses Final diagnoses:  None     @PCDICTATION @    Mannie Pac T, DO 10/06/23 1448

## 2023-10-06 NOTE — ED Triage Notes (Signed)
 Reports increased BP, decreased coordination, feeling unsteady Hx of bells palsy on L side of face.   Also states hx of aortic aneurysm, concern for increased BP.   A&Ox4, ambulatory

## 2023-10-06 NOTE — Discharge Instructions (Addendum)
 While you were in the emergency room, had a CT scan of your head that was normal.  You had blood work that was also normal.  You received a medication called lisinopril  for your blood pressure.  You may take that each day going forward.  Please call your primary care doctor today to set a follow-up appointment.

## 2023-10-18 ENCOUNTER — Inpatient Hospital Stay: Payer: Self-pay

## 2023-10-18 ENCOUNTER — Inpatient Hospital Stay: Payer: Self-pay | Admitting: Family

## 2023-10-22 ENCOUNTER — Other Ambulatory Visit: Payer: Self-pay | Admitting: Family

## 2023-10-22 DIAGNOSIS — D751 Secondary polycythemia: Secondary | ICD-10-CM

## 2023-10-22 DIAGNOSIS — D500 Iron deficiency anemia secondary to blood loss (chronic): Secondary | ICD-10-CM

## 2023-10-25 ENCOUNTER — Inpatient Hospital Stay: Payer: Self-pay | Attending: Hematology & Oncology

## 2023-10-25 ENCOUNTER — Inpatient Hospital Stay (HOSPITAL_BASED_OUTPATIENT_CLINIC_OR_DEPARTMENT_OTHER): Payer: Self-pay | Admitting: Family

## 2023-10-25 VITALS — BP 116/64 | HR 53 | Temp 97.6°F | Resp 18 | Ht 75.0 in | Wt 197.0 lb

## 2023-10-25 DIAGNOSIS — D500 Iron deficiency anemia secondary to blood loss (chronic): Secondary | ICD-10-CM

## 2023-10-25 DIAGNOSIS — D751 Secondary polycythemia: Secondary | ICD-10-CM

## 2023-10-25 DIAGNOSIS — F17210 Nicotine dependence, cigarettes, uncomplicated: Secondary | ICD-10-CM | POA: Insufficient documentation

## 2023-10-25 LAB — CMP (CANCER CENTER ONLY)
ALT: 13 U/L (ref 0–44)
AST: 20 U/L (ref 15–41)
Albumin: 4.4 g/dL (ref 3.5–5.0)
Alkaline Phosphatase: 58 U/L (ref 38–126)
Anion gap: 11 (ref 5–15)
BUN: 12 mg/dL (ref 6–20)
CO2: 26 mmol/L (ref 22–32)
Calcium: 8.9 mg/dL (ref 8.9–10.3)
Chloride: 104 mmol/L (ref 98–111)
Creatinine: 0.86 mg/dL (ref 0.61–1.24)
GFR, Estimated: >60 mL/min (ref >60)
Glucose, Bld: 108 mg/dL — ABNORMAL HIGH (ref 70–99)
Potassium: 4.6 mmol/L (ref 3.5–5.1)
Sodium: 141 mmol/L (ref 135–145)
Total Bilirubin: 0.3 mg/dL (ref 0.0–1.2)
Total Protein: 7.4 g/dL (ref 6.5–8.1)

## 2023-10-25 LAB — CBC WITH DIFFERENTIAL (CANCER CENTER ONLY)
Abs Immature Granulocytes: 0.02 K/uL (ref 0.00–0.07)
Basophils Absolute: 0.1 K/uL (ref 0.0–0.1)
Basophils Relative: 1 %
Eosinophils Absolute: 0.1 K/uL (ref 0.0–0.5)
Eosinophils Relative: 1 %
HCT: 44 % (ref 39.0–52.0)
Hemoglobin: 14.7 g/dL (ref 13.0–17.0)
Immature Granulocytes: 0 %
Lymphocytes Relative: 23 %
Lymphs Abs: 1.9 K/uL (ref 0.7–4.0)
MCH: 29.2 pg (ref 26.0–34.0)
MCHC: 33.4 g/dL (ref 30.0–36.0)
MCV: 87.5 fL (ref 80.0–100.0)
Monocytes Absolute: 0.7 K/uL (ref 0.1–1.0)
Monocytes Relative: 8 %
Neutro Abs: 5.8 K/uL (ref 1.7–7.7)
Neutrophils Relative %: 67 %
Platelet Count: 238 K/uL (ref 150–400)
RBC: 5.03 MIL/uL (ref 4.22–5.81)
RDW: 14.7 % (ref 11.5–15.5)
WBC Count: 8.6 K/uL (ref 4.0–10.5)
nRBC: 0 % (ref 0.0–0.2)

## 2023-10-25 LAB — IRON AND IRON BINDING CAPACITY (CC-WL,HP ONLY)
Iron: 37 ug/dL — ABNORMAL LOW (ref 45–182)
Saturation Ratios: 8 % — ABNORMAL LOW (ref 17.9–39.5)
TIBC: 490 ug/dL — ABNORMAL HIGH (ref 250–450)
UIBC: 453 ug/dL

## 2023-10-25 LAB — FERRITIN: Ferritin: 14 ng/mL — ABNORMAL LOW (ref 24–336)

## 2023-10-25 NOTE — Progress Notes (Signed)
 Hematology and Oncology Follow Up Visit  Stuart Hill [id number] 1970/07/14 53 y.o. 10/25/2023   Principle Diagnosis:  Secondary polycythemia JAK2 (-), smoker  LEFT Bell's palsy Leukocytosis    Current Therapy:        Phlebotomy to maintain hematocrit below 45% 2 baby aspirin  daily at bedtime    Interim History:  Stuart Hill is here today with his wife for follow-up. He has started working some again and feels that this is helping him feel a little better.  Fatigue comes and goes. SOB with over exertion unchanged from baseline.  He had a hypertensive episode and want to the ED 2 weeks ago. He was experiencing slurred speech and states that the left side of his face was drawing up. He is now on lisinopril  and is feeling better. Symptoms have resolved and BP is now stable.  He takes nitroglycerine tabs PRN for angina.  No fever, chills, n/v, cough, rash, dizziness, SOB, palpitations, abdominal pain or changes in bowel or bladder habits.  No swelling, numbness or tingling in his extremities at this time.  No falls or syncope reported.  Appetite and hydration are good. Weight is stable at 197 lbs.   ECOG Performance Status: 1 - Symptomatic but completely ambulatory  Medications:  Allergies as of 10/25/2023       Reactions   Hydroxyzine  Pamoate Hives, Itching   Plavix  [clopidogrel  Bisulfate] Other (See Comments)   Confusion, lethargy, slurred speech   Afrin [nasal Spray] Hives   Antihistamines, Chlorpheniramine-type Hives   Benadryl  [diphenhydramine  Hcl] Hives   Buprenorphine Hcl Hives   Carisoprodol Hives   Codeine Hives   Metoclopramide Hives   Morphine  And Codeine Hives   Oxymetazoline Hives   Pheniramine Hives, Rash        Medication List        Accurate as of October 25, 2023  1:36 PM. If you have any questions, ask your nurse or doctor.          albuterol  108 (90 Base) MCG/ACT inhaler Commonly known as: VENTOLIN  HFA Inhale 1-2 puffs into the lungs  every 6 (six) hours as needed for wheezing or shortness of breath.   ALPRAZolam  1 MG tablet Commonly known as: XANAX  TAKE 1 TABLET BY MOUTH THREE TIMES A DAY AS NEEDED FOR ANXIETY   amoxicillin  500 MG capsule Commonly known as: AMOXIL  Take 1 capsule (500 mg total) by mouth 3 (three) times daily.   aspirin  EC 81 MG tablet Take 162 mg by mouth at bedtime. Swallow whole.   lisinopril  10 MG tablet Commonly known as: ZESTRIL  Take 1 tablet (10 mg total) by mouth daily.   nitroGLYCERIN  0.4 MG SL tablet Commonly known as: NITROSTAT  Place 1 tablet (0.4 mg total) under the tongue every 5 (five) minutes as needed for chest pain.   ondansetron  4 MG disintegrating tablet Commonly known as: ZOFRAN -ODT Take 1 tablet (4 mg total) by mouth every 8 (eight) hours as needed for nausea.   VISINE OP Apply 1 drop to eye as needed (itchy, dryness).        Allergies:  Allergies  Allergen Reactions   Hydroxyzine  Pamoate Hives and Itching   Plavix  [Clopidogrel  Bisulfate] Other (See Comments)    Confusion, lethargy, slurred speech   Afrin [Nasal Spray] Hives   Antihistamines, Chlorpheniramine-Type Hives   Benadryl  [Diphenhydramine  Hcl] Hives   Buprenorphine Hcl Hives   Carisoprodol Hives   Codeine Hives   Metoclopramide Hives   Morphine  And Codeine Hives   Oxymetazoline Hives  Pheniramine Hives and Rash    Past Medical History, Surgical history, Social history, and Family History were reviewed and updated.  Review of Systems: All other 10 point review of systems is negative.   Physical Exam:  height is 6' 3 (1.905 m) and weight is 197 lb (89.4 kg). His oral temperature is 97.6 F (36.4 C). His blood pressure is 116/64 and his pulse is 53 (abnormal). His respiration is 18 and oxygen saturation is 100%.   Wt Readings from Last 3 Encounters:  10/25/23 197 lb (89.4 kg)  10/04/23 195 lb (88.5 kg)  09/08/23 196 lb (88.9 kg)    Ocular: Sclerae unicteric, pupils equal, round and  reactive to light Ear-nose-throat: Oropharynx clear, dentition fair Lymphatic: No cervical or supraclavicular adenopathy Lungs no rales or rhonchi, good excursion bilaterally Heart regular rate and rhythm, no murmur appreciated Abd soft, nontender, positive bowel sounds MSK no focal spinal tenderness, no joint edema Neuro: non-focal, well-oriented, appropriate affect Breasts: Deferred   Lab Results  Component Value Date   WBC 8.6 10/25/2023   HGB 14.7 10/25/2023   HCT 44.0 10/25/2023   MCV 87.5 10/25/2023   PLT 238 10/25/2023   Lab Results  Component Value Date   FERRITIN 31 09/14/2023   IRON 50 09/14/2023   TIBC 510 (H) 09/14/2023   UIBC 460 09/14/2023   IRONPCTSAT 10 (L) 09/14/2023   Lab Results  Component Value Date   RETICCTPCT 1.8 04/22/2022   RBC 5.03 10/25/2023   No results found for: KPAFRELGTCHN, LAMBDASER, KAPLAMBRATIO No results found for: KIMBERLY LE, IGMSERUM No results found for: STEPHANY CARLOTA BENSON MARKEL EARLA JOANNIE DOC VICK, SPEI   Chemistry      Component Value Date/Time   NA 141 10/25/2023 1230   NA 143 01/01/2017 1453   NA 139 11/12/2015 0945   K 4.6 10/25/2023 1230   K 4.3 01/01/2017 1453   K 3.7 11/12/2015 0945   CL 104 10/25/2023 1230   CL 103 01/01/2017 1453   CO2 26 10/25/2023 1230   CO2 27 01/01/2017 1453   CO2 22 11/12/2015 0945   BUN 12 10/25/2023 1230   BUN 18 01/01/2017 1453   BUN 13.1 11/12/2015 0945   CREATININE 0.86 10/25/2023 1230   CREATININE 0.96 10/05/2019 1042   CREATININE 0.9 11/12/2015 0945      Component Value Date/Time   CALCIUM  8.9 10/25/2023 1230   CALCIUM  8.8 01/01/2017 1453   CALCIUM  8.9 11/12/2015 0945   ALKPHOS 58 10/25/2023 1230   ALKPHOS 44 01/01/2017 1453   ALKPHOS 66 11/12/2015 0945   AST 20 10/25/2023 1230   AST 13 11/12/2015 0945   ALT 13 10/25/2023 1230   ALT 368 (HH) 01/01/2017 1453   ALT 14 11/12/2015 0945   BILITOT 0.3 10/25/2023 1230    BILITOT <0.22 11/12/2015 0945       Impression and Plan:  Stuart Hill is a 53 yo caucasian male with history of secondary polycythemia, JAK2 negative; smoker.   No phlebotomy for Hct 44%. Follow-up in 6 weeks.   Lauraine Pepper, NP 9/29/20251:36 PM

## 2023-11-06 ENCOUNTER — Other Ambulatory Visit: Payer: Self-pay | Admitting: Family Medicine

## 2023-11-10 ENCOUNTER — Other Ambulatory Visit: Payer: Self-pay | Admitting: Family Medicine

## 2023-11-17 ENCOUNTER — Encounter (HOSPITAL_BASED_OUTPATIENT_CLINIC_OR_DEPARTMENT_OTHER): Payer: Self-pay

## 2023-11-17 ENCOUNTER — Other Ambulatory Visit: Payer: Self-pay

## 2023-11-17 ENCOUNTER — Encounter: Payer: Self-pay | Admitting: Family

## 2023-11-17 ENCOUNTER — Emergency Department (HOSPITAL_BASED_OUTPATIENT_CLINIC_OR_DEPARTMENT_OTHER)
Admission: EM | Admit: 2023-11-17 | Discharge: 2023-11-17 | Discharge status: Against Medical Advice | Attending: Emergency Medicine | Admitting: Emergency Medicine

## 2023-11-17 DIAGNOSIS — R0602 Shortness of breath: Secondary | ICD-10-CM | POA: Insufficient documentation

## 2023-11-17 DIAGNOSIS — M546 Pain in thoracic spine: Secondary | ICD-10-CM | POA: Diagnosis not present

## 2023-11-17 DIAGNOSIS — Z5321 Procedure and treatment not carried out due to patient leaving prior to being seen by health care provider: Secondary | ICD-10-CM | POA: Diagnosis not present

## 2023-11-17 DIAGNOSIS — R112 Nausea with vomiting, unspecified: Secondary | ICD-10-CM | POA: Insufficient documentation

## 2023-11-17 DIAGNOSIS — M545 Low back pain, unspecified: Secondary | ICD-10-CM | POA: Diagnosis not present

## 2023-11-17 LAB — COMPREHENSIVE METABOLIC PANEL WITH GFR
ALT: 17 U/L (ref 0–44)
AST: 23 U/L (ref 15–41)
Albumin: 4.5 g/dL (ref 3.5–5.0)
Alkaline Phosphatase: 63 U/L (ref 38–126)
Anion gap: 12 (ref 5–15)
BUN: 14 mg/dL (ref 6–20)
CO2: 25 mmol/L (ref 22–32)
Calcium: 9.3 mg/dL (ref 8.9–10.3)
Chloride: 102 mmol/L (ref 98–111)
Creatinine, Ser: 0.81 mg/dL (ref 0.61–1.24)
GFR, Estimated: >60 mL/min (ref >60)
Glucose, Bld: 87 mg/dL (ref 70–99)
Potassium: 4 mmol/L (ref 3.5–5.1)
Sodium: 139 mmol/L (ref 135–145)
Total Bilirubin: 0.4 mg/dL (ref 0.0–1.2)
Total Protein: 7.6 g/dL (ref 6.5–8.1)

## 2023-11-17 LAB — URINALYSIS, ROUTINE W REFLEX MICROSCOPIC
Bilirubin Urine: NEGATIVE
Glucose, UA: NEGATIVE mg/dL
Hgb urine dipstick: NEGATIVE
Ketones, ur: NEGATIVE mg/dL
Leukocytes,Ua: NEGATIVE
Nitrite: NEGATIVE
Protein, ur: NEGATIVE mg/dL
Specific Gravity, Urine: 1.025 (ref 1.005–1.030)
pH: 7 (ref 5.0–8.0)

## 2023-11-17 LAB — CBC
HCT: 48.5 % (ref 39.0–52.0)
Hemoglobin: 16.3 g/dL (ref 13.0–17.0)
MCH: 29.4 pg (ref 26.0–34.0)
MCHC: 33.6 g/dL (ref 30.0–36.0)
MCV: 87.5 fL (ref 80.0–100.0)
Platelets: 292 K/uL (ref 150–400)
RBC: 5.54 MIL/uL (ref 4.22–5.81)
RDW: 14.6 % (ref 11.5–15.5)
WBC: 10.1 K/uL (ref 4.0–10.5)
nRBC: 0 % (ref 0.0–0.2)

## 2023-11-17 LAB — TROPONIN T, HIGH SENSITIVITY: Troponin T High Sensitivity: <15 ng/L (ref 0–19)

## 2023-11-17 LAB — LIPASE, BLOOD: Lipase: 30 U/L (ref 11–51)

## 2023-11-17 NOTE — ED Triage Notes (Signed)
 R sided lower and upper back pain, radiates to chest and R side of abd since last night. States has episodes of this for 1 year.   States pain feels similar to kidney stone pain Also reports SHOB, N/V Denies diarrhea, constipation, fevers, urinary symptoms
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
## 2023-11-18 ENCOUNTER — Ambulatory Visit: Admitting: Family Medicine

## 2023-11-18 VITALS — BP 151/98 | HR 67 | Temp 97.7°F | Resp 16 | Ht 75.0 in | Wt 192.2 lb

## 2023-11-18 DIAGNOSIS — G8929 Other chronic pain: Secondary | ICD-10-CM | POA: Diagnosis not present

## 2023-11-18 DIAGNOSIS — I10 Essential (primary) hypertension: Secondary | ICD-10-CM | POA: Diagnosis not present

## 2023-11-18 DIAGNOSIS — M549 Dorsalgia, unspecified: Secondary | ICD-10-CM

## 2023-11-18 DIAGNOSIS — M25519 Pain in unspecified shoulder: Secondary | ICD-10-CM

## 2023-11-18 MED ORDER — LISINOPRIL 10 MG PO TABS: 10.0000 mg | ORAL_TABLET | Freq: Every day | ORAL | 3 refills | Status: AC

## 2023-11-20 ENCOUNTER — Encounter: Payer: Self-pay | Admitting: Family Medicine

## 2023-11-20 NOTE — Addendum Note (Signed)
 Addended by: WATT RAISIN C on: 11/20/2023 05:44 AM   Modules accepted: Orders

## 2023-12-06 ENCOUNTER — Encounter: Payer: Self-pay | Admitting: Family

## 2023-12-06 ENCOUNTER — Inpatient Hospital Stay: Payer: Self-pay | Admitting: Family

## 2023-12-06 ENCOUNTER — Inpatient Hospital Stay: Payer: Self-pay | Attending: Hematology & Oncology

## 2023-12-06 ENCOUNTER — Inpatient Hospital Stay: Payer: Self-pay

## 2023-12-06 VITALS — BP 177/83 | HR 68 | Temp 97.6°F | Resp 20 | Ht 75.0 in | Wt 192.0 lb

## 2023-12-06 VITALS — BP 149/98 | HR 71

## 2023-12-06 DIAGNOSIS — F17200 Nicotine dependence, unspecified, uncomplicated: Secondary | ICD-10-CM | POA: Insufficient documentation

## 2023-12-06 DIAGNOSIS — D500 Iron deficiency anemia secondary to blood loss (chronic): Secondary | ICD-10-CM

## 2023-12-06 DIAGNOSIS — D72829 Elevated white blood cell count, unspecified: Secondary | ICD-10-CM | POA: Insufficient documentation

## 2023-12-06 DIAGNOSIS — Z7982 Long term (current) use of aspirin: Secondary | ICD-10-CM | POA: Insufficient documentation

## 2023-12-06 DIAGNOSIS — D751 Secondary polycythemia: Secondary | ICD-10-CM

## 2023-12-06 LAB — IRON AND IRON BINDING CAPACITY (CC-WL,HP ONLY)
Iron: 75 ug/dL (ref 45–182)
Saturation Ratios: 16 % — ABNORMAL LOW (ref 17.9–39.5)
TIBC: 479 ug/dL — ABNORMAL HIGH (ref 250–450)
UIBC: 404 ug/dL

## 2023-12-06 LAB — CMP (CANCER CENTER ONLY)
ALT: 18 U/L (ref 0–44)
AST: 24 U/L (ref 15–41)
Albumin: 4.7 g/dL (ref 3.5–5.0)
Alkaline Phosphatase: 65 U/L (ref 38–126)
Anion gap: 11 (ref 5–15)
BUN: 10 mg/dL (ref 6–20)
CO2: 26 mmol/L (ref 22–32)
Calcium: 9.2 mg/dL (ref 8.9–10.3)
Chloride: 104 mmol/L (ref 98–111)
Creatinine: 0.76 mg/dL (ref 0.61–1.24)
GFR, Estimated: >60 mL/min (ref >60)
Glucose, Bld: 107 mg/dL — ABNORMAL HIGH (ref 70–99)
Potassium: 4.4 mmol/L (ref 3.5–5.1)
Sodium: 141 mmol/L (ref 135–145)
Total Bilirubin: 0.4 mg/dL (ref 0.0–1.2)
Total Protein: 7.6 g/dL (ref 6.5–8.1)

## 2023-12-06 LAB — CBC WITH DIFFERENTIAL (CANCER CENTER ONLY)
Abs Immature Granulocytes: 0.09 K/uL — ABNORMAL HIGH (ref 0.00–0.07)
Basophils Absolute: 0.1 K/uL (ref 0.0–0.1)
Basophils Relative: 1 %
Eosinophils Absolute: 0 K/uL (ref 0.0–0.5)
Eosinophils Relative: 0 %
HCT: 49.1 % (ref 39.0–52.0)
Hemoglobin: 16.7 g/dL (ref 13.0–17.0)
Immature Granulocytes: 1 %
Lymphocytes Relative: 16 %
Lymphs Abs: 1.7 K/uL (ref 0.7–4.0)
MCH: 30 pg (ref 26.0–34.0)
MCHC: 34 g/dL (ref 30.0–36.0)
MCV: 88.2 fL (ref 80.0–100.0)
Monocytes Absolute: 0.9 K/uL (ref 0.1–1.0)
Monocytes Relative: 9 %
Neutro Abs: 7.9 K/uL — ABNORMAL HIGH (ref 1.7–7.7)
Neutrophils Relative %: 73 %
Platelet Count: 270 K/uL (ref 150–400)
RBC: 5.57 MIL/uL (ref 4.22–5.81)
RDW: 14.2 % (ref 11.5–15.5)
WBC Count: 10.7 K/uL — ABNORMAL HIGH (ref 4.0–10.5)
nRBC: 0 % (ref 0.0–0.2)

## 2023-12-06 LAB — FERRITIN: Ferritin: 21 ng/mL — ABNORMAL LOW (ref 24–336)

## 2023-12-06 NOTE — Progress Notes (Signed)
 Hematology and Oncology Follow Up Visit  Stuart Hill [id number] [birth year] 53 y.o. 12/06/2023   Principle Diagnosis:  Secondary polycythemia JAK2 (-), smoker  LEFT Bell's palsy Leukocytosis    Current Therapy:        Phlebotomy to maintain hematocrit below 45% 2 baby aspirin  daily at bedtime    Interim History:  Stuart Hill is here today with his wife for follow-up. He is feeling a little better. They have found [place] sweet [place] They are really enjoying this.  He has some fatigue at times.  He states that stress has effected his HTN and so he is working on changing his mindset and removing stressors.  No fever, chills, n/v, cough, rash, dizziness, SOB, chest pain, palpitations, abdominal pain or changes in bowel or bladder habits at this time.  No swelling, tenderness, numbness or tingling in his extremities at this time.  No falls or syncope.  Appetite and hydration are good. Weight is stable at 192 lbs.   ECOG Performance Status: 1 - Symptomatic but completely ambulatory  Medications:  Allergies as of 12/06/2023       Reactions   Hydroxyzine  Pamoate Hives, Itching   Plavix  [clopidogrel  Bisulfate] Other (See Comments)   Confusion, lethargy, slurred speech   Afrin [nasal Spray] Hives   Antihistamines, Chlorpheniramine-type Hives   Benadryl  [diphenhydramine  Hcl] Hives   Buprenorphine Hcl Hives   Carisoprodol Hives   Codeine Hives   Metoclopramide Hives   Morphine  And Codeine Hives   Oxymetazoline Hives   Pheniramine Hives, Rash        Medication List        Accurate as of December 06, 2023 12:17 PM. If you have any questions, ask your nurse or doctor.          albuterol  108 (90 Base) MCG/ACT inhaler Commonly known as: VENTOLIN  HFA Inhale 1-2 puffs into the lungs every 6 (six) hours as needed for wheezing or shortness of breath.   aspirin  EC 81 MG tablet Take 162 mg by mouth at bedtime. Swallow whole.   lisinopril   10 MG tablet Commonly known as: ZESTRIL  Take 1 tablet (10 mg total) by mouth daily.   nitroGLYCERIN  0.4 MG SL tablet Commonly known as: NITROSTAT  Place 1 tablet (0.4 mg total) under the tongue every 5 (five) minutes as needed for chest pain.   ondansetron  4 MG disintegrating tablet Commonly known as: ZOFRAN -ODT Take 1 tablet (4 mg total) by mouth every 8 (eight) hours as needed for nausea.   sildenafil  20 MG tablet Commonly known as: REVATIO  Take 20 to 60 mg by mouth daily as needed prior to sexual activity.  Do not combine this medication with nitroglycerin    VISINE OP Apply 1 drop to eye as needed (itchy, dryness).        Allergies:  Allergies  Allergen Reactions   Hydroxyzine  Pamoate Hives and Itching   Plavix  [Clopidogrel  Bisulfate] Other (See Comments)    Confusion, lethargy, slurred speech   Afrin [Nasal Spray] Hives   Antihistamines, Chlorpheniramine-Type Hives   Benadryl  [Diphenhydramine  Hcl] Hives   Buprenorphine Hcl Hives   Carisoprodol Hives   Codeine Hives   Metoclopramide Hives   Morphine  And Codeine Hives   Oxymetazoline Hives   Pheniramine Hives and Rash    Past Medical History, Surgical history, Social history, and Family History were reviewed and updated.  Review of Systems: All other 10 point review of systems is negative.   Physical Exam:  vitals were not taken for this visit.   Wt Readings from Last 3 Encounters:  11/18/23 192 lb 3.2 oz (87.2 kg)  10/25/23 197 lb (89.4 kg)  10/04/23 195 lb (88.5 kg)    Ocular: Sclerae unicteric, pupils equal, round and reactive to light Ear-nose-throat: Oropharynx clear, dentition fair Lymphatic: No cervical or supraclavicular adenopathy Lungs no rales or rhonchi, good excursion bilaterally Heart regular rate and rhythm, no murmur appreciated Abd soft, nontender, positive bowel sounds MSK no focal spinal tenderness, no joint edema Neuro: non-focal, well-oriented, appropriate affect Breasts: Deferred    Lab Results  Component Value Date   WBC 10.1 11/17/2023   HGB 16.3 11/17/2023   HCT 48.5 11/17/2023   MCV 87.5 11/17/2023   PLT 292 11/17/2023   Lab Results  Component Value Date   FERRITIN 14 (L) 10/25/2023   IRON 37 (L) 10/25/2023   TIBC 490 (H) 10/25/2023   UIBC 453 10/25/2023   IRONPCTSAT 8 (L) 10/25/2023   Lab Results  Component Value Date   RETICCTPCT 1.8 04/22/2022   RBC 5.54 11/17/2023   No results found for: KPAFRELGTCHN, LAMBDASER, KAPLAMBRATIO No results found for: IGGSERUM, IGA, IGMSERUM No results found for: STEPHANY CARLOTA BENSON MARKEL EARLA JOANNIE DOC VICK, SPEI   Chemistry      Component Value Date/Time   NA 139 11/17/2023 1047   NA 143 01/01/2017 1453   NA 139 11/12/2015 0945   K 4.0 11/17/2023 1047   K 4.3 01/01/2017 1453   K 3.7 11/12/2015 0945   CL 102 11/17/2023 1047   CL 103 01/01/2017 1453   CO2 25 11/17/2023 1047   CO2 27 01/01/2017 1453   CO2 22 11/12/2015 0945   BUN 14 11/17/2023 1047   BUN 18 01/01/2017 1453   BUN 13.1 11/12/2015 0945   CREATININE 0.81 11/17/2023 1047   CREATININE 0.86 10/25/2023 1230   CREATININE 0.96 10/05/2019 1042   CREATININE 0.9 11/12/2015 0945      Component Value Date/Time   CALCIUM  9.3 11/17/2023 1047   CALCIUM  8.8 01/01/2017 1453   CALCIUM  8.9 11/12/2015 0945   ALKPHOS 63 11/17/2023 1047   ALKPHOS 44 01/01/2017 1453   ALKPHOS 66 11/12/2015 0945   AST 23 11/17/2023 1047   AST 20 10/25/2023 1230   AST 13 11/12/2015 0945   ALT 17 11/17/2023 1047   ALT 13 10/25/2023 1230   ALT 368 (HH) 01/01/2017 1453   ALT 14 11/12/2015 0945   BILITOT 0.4 11/17/2023 1047   BILITOT 0.3 10/25/2023 1230   BILITOT <0.22 11/12/2015 0945       Impression and Plan: Stuart Hill is a 52 yo caucasian male with history of secondary polycythemia, JAK2 negative; smoker.   We will proceed with phlebotomy for Hct 49%. Follow-up in 6 weeks.   Lauraine Pepper, NP 11/10/202512:17  PM

## 2023-12-06 NOTE — Progress Notes (Signed)
 Stuart Hill presents today for phlebotomy per MD orders. Phlebotomy procedure started at 1300 and ended at 1307. 575 grams removed without difficulty to right arm with phlebotomy kit. Patient refused to stay for 30 minutes post phlebotomy and is without complaints at time of discharge.  Patient tolerated procedure well. IV needle removed intact.

## 2024-01-17 ENCOUNTER — Inpatient Hospital Stay: Attending: Hematology & Oncology

## 2024-01-17 ENCOUNTER — Inpatient Hospital Stay

## 2024-01-17 ENCOUNTER — Inpatient Hospital Stay: Admitting: Family

## 2024-02-11 ENCOUNTER — Telehealth: Payer: Self-pay | Admitting: Family

## 2024-02-11 NOTE — Telephone Encounter (Signed)
 Returning voicemail. Called to schedule pt for appts as requested. Unable to LVM.

## 2024-02-21 ENCOUNTER — Inpatient Hospital Stay

## 2024-02-21 ENCOUNTER — Inpatient Hospital Stay: Admitting: Family

## 2024-02-25 ENCOUNTER — Inpatient Hospital Stay: Attending: Hematology & Oncology

## 2024-02-25 ENCOUNTER — Inpatient Hospital Stay

## 2024-02-25 ENCOUNTER — Inpatient Hospital Stay: Admitting: Family

## 2024-02-25 VITALS — BP 133/92 | HR 68 | Temp 97.7°F | Resp 19 | Ht 75.0 in | Wt 200.8 lb

## 2024-02-25 VITALS — BP 129/87 | HR 80 | Resp 18

## 2024-02-25 DIAGNOSIS — D751 Secondary polycythemia: Secondary | ICD-10-CM | POA: Insufficient documentation

## 2024-02-25 DIAGNOSIS — D72829 Elevated white blood cell count, unspecified: Secondary | ICD-10-CM | POA: Insufficient documentation

## 2024-02-25 DIAGNOSIS — Z7982 Long term (current) use of aspirin: Secondary | ICD-10-CM | POA: Insufficient documentation

## 2024-02-25 DIAGNOSIS — R5383 Other fatigue: Secondary | ICD-10-CM | POA: Insufficient documentation

## 2024-02-25 DIAGNOSIS — D500 Iron deficiency anemia secondary to blood loss (chronic): Secondary | ICD-10-CM | POA: Diagnosis not present

## 2024-02-25 DIAGNOSIS — F17200 Nicotine dependence, unspecified, uncomplicated: Secondary | ICD-10-CM | POA: Diagnosis not present

## 2024-02-25 LAB — CBC WITH DIFFERENTIAL (CANCER CENTER ONLY)
Abs Immature Granulocytes: 0.02 10*3/uL (ref 0.00–0.07)
Basophils Absolute: 0.1 10*3/uL (ref 0.0–0.1)
Basophils Relative: 1 %
Eosinophils Absolute: 0 10*3/uL (ref 0.0–0.5)
Eosinophils Relative: 0 %
HCT: 48.6 % (ref 39.0–52.0)
Hemoglobin: 16.5 g/dL (ref 13.0–17.0)
Immature Granulocytes: 0 %
Lymphocytes Relative: 18 %
Lymphs Abs: 1.8 10*3/uL (ref 0.7–4.0)
MCH: 30.5 pg (ref 26.0–34.0)
MCHC: 34 g/dL (ref 30.0–36.0)
MCV: 89.8 fL (ref 80.0–100.0)
Monocytes Absolute: 0.9 10*3/uL (ref 0.1–1.0)
Monocytes Relative: 9 %
Neutro Abs: 7.2 10*3/uL (ref 1.7–7.7)
Neutrophils Relative %: 72 %
Platelet Count: 260 10*3/uL (ref 150–400)
RBC: 5.41 MIL/uL (ref 4.22–5.81)
RDW: 13.8 % (ref 11.5–15.5)
WBC Count: 10 10*3/uL (ref 4.0–10.5)
nRBC: 0 % (ref 0.0–0.2)

## 2024-02-25 LAB — CMP (CANCER CENTER ONLY)
ALT: 27 U/L (ref 0–44)
AST: 23 U/L (ref 15–41)
Albumin: 4.5 g/dL (ref 3.5–5.0)
Alkaline Phosphatase: 68 U/L (ref 38–126)
Anion gap: 11 (ref 5–15)
BUN: 15 mg/dL (ref 6–20)
CO2: 25 mmol/L (ref 22–32)
Calcium: 9 mg/dL (ref 8.9–10.3)
Chloride: 104 mmol/L (ref 98–111)
Creatinine: 0.91 mg/dL (ref 0.61–1.24)
GFR, Estimated: >60 mL/min
Glucose, Bld: 96 mg/dL (ref 70–99)
Potassium: 4.5 mmol/L (ref 3.5–5.1)
Sodium: 139 mmol/L (ref 135–145)
Total Bilirubin: 0.5 mg/dL (ref 0.0–1.2)
Total Protein: 7.6 g/dL (ref 6.5–8.1)

## 2024-02-25 LAB — IRON AND IRON BINDING CAPACITY (CC-WL,HP ONLY)
Iron: 154 ug/dL (ref 45–182)
Saturation Ratios: 32 % (ref 17.9–39.5)
TIBC: 480 ug/dL — ABNORMAL HIGH (ref 250–450)
UIBC: 326 ug/dL

## 2024-02-25 LAB — FERRITIN: Ferritin: 31 ng/mL (ref 24–336)

## 2024-02-25 NOTE — Progress Notes (Signed)
 " Hematology and Oncology Follow Up Visit  Stuart Hill [id number] 10-23-1970 54 y.o. 02/25/2024   Principle Diagnosis:  Secondary polycythemia JAK2 (-), smoker  LEFT Bell's palsy Leukocytosis    Current Therapy:        Phlebotomy to maintain hematocrit below 45% 2 baby aspirin  daily at bedtime    Interim History:  Stuart Hill is here today for follow-up and phlebotomy. Hct is 48.6%. He has noted fatigue and brain fog.  No fever, chills, n/v, cough, rash, dizziness, chest pain, palpitations, abdominal pain or changes in bowel or bladder habits.  No swelling, tenderness, numbness or tingling in her extremities.  No falls or syncope.  Appetite and hydration are good. Weight is stable at 200 lbs.   ECOG Performance Status: 1 - Symptomatic but completely ambulatory  Medications:  Allergies as of 02/25/2024       Reactions   Hydroxyzine  Pamoate Hives, Itching   Plavix  [clopidogrel  Bisulfate] Other (See Comments)   Confusion, lethargy, slurred speech   Afrin [nasal Spray] Hives   Antihistamines, Chlorpheniramine-type Hives   Benadryl  [diphenhydramine  Hcl] Hives   Buprenorphine Hcl Hives   Carisoprodol Hives   Codeine Hives   Metoclopramide Hives   Morphine  And Codeine Hives   Oxymetazoline Hives   Pheniramine Hives, Rash        Medication List        Accurate as of February 25, 2024  2:04 PM. If you have any questions, ask your nurse or doctor.          albuterol  108 (90 Base) MCG/ACT inhaler Commonly known as: VENTOLIN  HFA Inhale 1-2 puffs into the lungs every 6 (six) hours as needed for wheezing or shortness of breath.   aspirin  EC 81 MG tablet Take 162 mg by mouth at bedtime. Swallow whole. What changed: additional instructions   lisinopril  10 MG tablet Commonly known as: ZESTRIL  Take 1 tablet (10 mg total) by mouth daily.   losartan  25 MG tablet Commonly known as: COZAAR  Take 25 mg by mouth daily.   Melatonin 10 MG Caps Take 10 mg by mouth at  bedtime as needed.   nitroGLYCERIN  0.4 MG SL tablet Commonly known as: NITROSTAT  Place 1 tablet (0.4 mg total) under the tongue every 5 (five) minutes as needed for chest pain.   OVER THE COUNTER MEDICATION at bedtime. CBD gummies as hs prn sleep.   sildenafil  20 MG tablet Commonly known as: REVATIO  Take 20 to 60 mg by mouth daily as needed prior to sexual activity.  Do not combine this medication with nitroglycerin    VISINE OP Apply 1 drop to eye as needed (itchy, dryness).        Allergies: Allergies[1]  Past Medical History, Surgical history, Social history, and Family History were reviewed and updated.  Review of Systems: All other 10 point review of systems is negative.   Physical Exam:  vitals were not taken for this visit.   Wt Readings from Last 3 Encounters:  12/06/23 192 lb (87.1 kg)  11/18/23 192 lb 3.2 oz (87.2 kg)  10/25/23 197 lb (89.4 kg)    Ocular: Sclerae unicteric, pupils equal, round and reactive to light Ear-nose-throat: Oropharynx clear, dentition fair Lymphatic: No cervical or supraclavicular adenopathy Lungs no rales or rhonchi, good excursion bilaterally Heart regular rate and rhythm, no murmur appreciated Abd soft, nontender, positive bowel sounds MSK no focal spinal tenderness, no joint edema Neuro: non-focal, well-oriented, appropriate affect Breasts: Deferred   Lab Results  Component Value Date  WBC 10.0 02/25/2024   HGB 16.5 02/25/2024   HCT 48.6 02/25/2024   MCV 89.8 02/25/2024   PLT 260 02/25/2024   Lab Results  Component Value Date   FERRITIN 21 (L) 12/06/2023   IRON 75 12/06/2023   TIBC 479 (H) 12/06/2023   UIBC 404 12/06/2023   IRONPCTSAT 16 (L) 12/06/2023   Lab Results  Component Value Date   RETICCTPCT 1.8 04/22/2022   RBC 5.41 02/25/2024   No results found for: KPAFRELGTCHN, LAMBDASER, KAPLAMBRATIO No results found for: IGGSERUM, IGA, IGMSERUM No results found for: STEPHANY CARLOTA BENSON MARKEL EARLA JOANNIE DOC VICK, SPEI   Chemistry      Component Value Date/Time   NA 141 12/06/2023 1158   NA 143 01/01/2017 1453   NA 139 11/12/2015 0945   K 4.4 12/06/2023 1158   K 4.3 01/01/2017 1453   K 3.7 11/12/2015 0945   CL 104 12/06/2023 1158   CL 103 01/01/2017 1453   CO2 26 12/06/2023 1158   CO2 27 01/01/2017 1453   CO2 22 11/12/2015 0945   BUN 10 12/06/2023 1158   BUN 18 01/01/2017 1453   BUN 13.1 11/12/2015 0945   CREATININE 0.76 12/06/2023 1158   CREATININE 0.96 10/05/2019 1042   CREATININE 0.9 11/12/2015 0945      Component Value Date/Time   CALCIUM  9.2 12/06/2023 1158   CALCIUM  8.8 01/01/2017 1453   CALCIUM  8.9 11/12/2015 0945   ALKPHOS 65 12/06/2023 1158   ALKPHOS 44 01/01/2017 1453   ALKPHOS 66 11/12/2015 0945   AST 24 12/06/2023 1158   AST 13 11/12/2015 0945   ALT 18 12/06/2023 1158   ALT 368 (HH) 01/01/2017 1453   ALT 14 11/12/2015 0945   BILITOT 0.4 12/06/2023 1158   BILITOT <0.22 11/12/2015 0945       Impression and Plan: Stuart Hill is a 54 yo caucasian male with history of secondary polycythemia, JAK2 negative; smoker.   We will proceed with phlebotomy for Hct 48.6%. Follow-up in 6 weeks.   Lauraine Pepper, NP 1/30/20262:04 PM     [1]  Allergies Allergen Reactions   Hydroxyzine  Pamoate Hives and Itching   Plavix  [Clopidogrel  Bisulfate] Other (See Comments)    Confusion, lethargy, slurred speech   Afrin [Nasal Spray] Hives   Antihistamines, Chlorpheniramine-Type Hives   Benadryl  [Diphenhydramine  Hcl] Hives   Buprenorphine Hcl Hives   Carisoprodol Hives   Codeine Hives   Metoclopramide Hives   Morphine  And Codeine Hives   Oxymetazoline Hives   Pheniramine Hives and Rash   "

## 2024-02-25 NOTE — Patient Instructions (Signed)
 Planned Blood Draining (Therapeutic Bleeding): What to Know After After the planned draining of blood from the body, also called therapeutic bleeding, it's common to have: Light-headedness or dizziness. You may feel faint. A feeling like you may throw up. Tiredness. Follow these instructions at home: Eating and drinking Be sure to eat healthy meals for the next 24 hours. Drink more fluids as told. Don't drink alcohol  the same day you had therapeutic bleeding. Activity Ask what things are safe for you to do at home. Ask when you can go back to work or school. Avoid doing activities that take a lot of effort the same day as the therapeutic bleeding. Athletes should avoid exercise that takes a lot of effort for as long as told. Change positions slowly for the rest of the day. Move from sitting to standing slowly. This can help prevent feeling light-headed or fainting. If you feel light-headed, lie down until the feeling goes away. IV site care  Keep your bandage dry. You can remove the bandage as told by your health care provider. If you have bleeding where the needle went in, also called the IV site: Raise your arm. Press firmly on the site until the bleeding stops. If you have bruising at the IV site, put ice on the area as told. Place a towel between your skin and the ice. Leave the ice on for 20 minutes, 2-3 times a day, for the first 24 hours. The risk of damage is higher if you can't feel pain, heat, or cold. If the swelling doesn't go away after 24 hours, put a warm, moist cloth on the area for 20 minutes, 2-3 times a day. Check the area around your IV site every day for signs of infection. Check for: Redness, swelling, or pain. Fluid or blood. Warmth. Pus or a bad smell.  General instructions Do not smoke, vape, or use nicotine or tobacco. Keep all follow-up visits. You may need to have blood tests and therapeutic bleeding treatments often. Contact a health care provider  if: You have signs of infection at the IV site. You have a fever or chills. You keep feeling: Light-headed or dizzy. Weak and tired. Like you may throw up. You throw up. Get help right away if: You have chest pain. You have trouble breathing. This information is not intended to replace advice given to you by your health care provider. Make sure you discuss any questions you have with your health care provider. Document Revised: 05/28/2023 Document Reviewed: 05/28/2023 Elsevier Patient Education  2025 [employer].

## 2024-02-25 NOTE — Progress Notes (Signed)
 14[name] presents today for phlebotomy per MD orders. Phlebotomy procedure started at 1430 and ended at 1435. 553 cc removed via 16 G needle. Patient tolerated procedure well. Patient refused to wait 30 minutes post phlebotomy. Released stable and ASX.

## 2024-04-07 ENCOUNTER — Inpatient Hospital Stay: Admitting: Family

## 2024-04-07 ENCOUNTER — Inpatient Hospital Stay: Attending: Hematology & Oncology

## 2024-04-07 ENCOUNTER — Inpatient Hospital Stay
